# Patient Record
Sex: Male | Born: 1945
Health system: Southern US, Community
[De-identification: ages and names within clinical notes are randomized; demographics above are authoritative.]

## PROBLEM LIST (undated history)

## (undated) DIAGNOSIS — Z9861 Coronary angioplasty status: Principal | ICD-10-CM

## (undated) DIAGNOSIS — D689 Coagulation defect, unspecified: Secondary | ICD-10-CM

## (undated) DIAGNOSIS — I82409 Acute embolism and thrombosis of unspecified deep veins of unspecified lower extremity: Principal | ICD-10-CM

## (undated) DIAGNOSIS — E785 Hyperlipidemia, unspecified: Secondary | ICD-10-CM

## (undated) DIAGNOSIS — E230 Hypopituitarism: Secondary | ICD-10-CM

## (undated) DIAGNOSIS — I2510 Atherosclerotic heart disease of native coronary artery without angina pectoris: Secondary | ICD-10-CM

## (undated) DIAGNOSIS — Z86718 Personal history of other venous thrombosis and embolism: Secondary | ICD-10-CM

## (undated) DIAGNOSIS — E039 Hypothyroidism, unspecified: Secondary | ICD-10-CM

## (undated) DIAGNOSIS — M47817 Spondylosis without myelopathy or radiculopathy, lumbosacral region: Secondary | ICD-10-CM

## (undated) DIAGNOSIS — I10 Essential (primary) hypertension: Secondary | ICD-10-CM

## (undated) HISTORY — DX: Hyperlipidemia, unspecified: E78.5

## (undated) HISTORY — DX: Personal history of other venous thrombosis and embolism: Z86.718

## (undated) HISTORY — DX: Atherosclerotic heart disease of native coronary artery without angina pectoris: I25.10

## (undated) HISTORY — DX: Coronary angioplasty status: Z98.61

## (undated) HISTORY — PX: SPINE SURGERY: SHX786

## (undated) HISTORY — DX: Essential (primary) hypertension: I10

## (undated) HISTORY — PX: ARTHROSCOPY KNEE W/ DRILLING: SUR92

## (undated) HISTORY — DX: Hypothyroidism, unspecified: E03.9

## (undated) HISTORY — DX: Spondylosis without myelopathy or radiculopathy, lumbosacral region: M47.817

## (undated) HISTORY — DX: Acute embolism and thrombosis of unspecified deep veins of unspecified lower extremity: I82.409

## (undated) HISTORY — DX: Coagulation defect, unspecified: D68.9

## (undated) HISTORY — DX: Hypopituitarism: E23.0

---

## 1997-10-24 ENCOUNTER — Observation Stay (HOSPITAL_COMMUNITY): Admission: EM | Admit: 1997-10-24 | Discharge: 1997-10-24 | Payer: Self-pay | Admitting: *Deleted

## 1997-11-03 ENCOUNTER — Ambulatory Visit (HOSPITAL_COMMUNITY): Admission: RE | Admit: 1997-11-03 | Discharge: 1997-11-03 | Payer: Self-pay | Admitting: Cardiology

## 1997-12-27 ENCOUNTER — Ambulatory Visit (HOSPITAL_COMMUNITY): Admission: RE | Admit: 1997-12-27 | Discharge: 1997-12-27 | Payer: Self-pay | Admitting: Cardiology

## 2002-07-17 HISTORY — PX: CORONARY ANGIOPLASTY WITH STENT PLACEMENT: SHX49

## 2002-07-19 DIAGNOSIS — I2510 Atherosclerotic heart disease of native coronary artery without angina pectoris: Secondary | ICD-10-CM | POA: Insufficient documentation

## 2002-07-29 ENCOUNTER — Encounter: Payer: Self-pay | Admitting: Cardiology

## 2002-07-30 ENCOUNTER — Inpatient Hospital Stay (HOSPITAL_COMMUNITY): Admission: AD | Admit: 2002-07-30 | Discharge: 2002-07-31 | Payer: Self-pay | Admitting: Cardiology

## 2003-05-24 ENCOUNTER — Emergency Department (HOSPITAL_COMMUNITY): Admission: EM | Admit: 2003-05-24 | Discharge: 2003-05-24 | Payer: Self-pay | Admitting: Emergency Medicine

## 2003-05-25 ENCOUNTER — Ambulatory Visit (HOSPITAL_COMMUNITY): Admission: RE | Admit: 2003-05-25 | Discharge: 2003-05-25 | Payer: Self-pay | Admitting: Gastroenterology

## 2004-12-12 ENCOUNTER — Emergency Department (HOSPITAL_COMMUNITY): Admission: EM | Admit: 2004-12-12 | Discharge: 2004-12-13 | Payer: Self-pay | Admitting: Emergency Medicine

## 2007-04-17 HISTORY — PX: CARDIAC CATHETERIZATION: SHX172

## 2007-05-05 ENCOUNTER — Ambulatory Visit (HOSPITAL_COMMUNITY): Admission: RE | Admit: 2007-05-05 | Discharge: 2007-05-05 | Payer: Self-pay | Admitting: Cardiology

## 2009-10-08 ENCOUNTER — Encounter: Admission: RE | Admit: 2009-10-08 | Discharge: 2009-10-08 | Payer: Self-pay | Admitting: Emergency Medicine

## 2009-12-13 ENCOUNTER — Inpatient Hospital Stay (HOSPITAL_COMMUNITY): Admission: RE | Admit: 2009-12-13 | Discharge: 2009-12-21 | Payer: Self-pay | Admitting: Neurosurgery

## 2009-12-15 ENCOUNTER — Ambulatory Visit: Payer: Self-pay | Admitting: Vascular Surgery

## 2009-12-15 ENCOUNTER — Encounter (INDEPENDENT_AMBULATORY_CARE_PROVIDER_SITE_OTHER): Payer: Self-pay | Admitting: Internal Medicine

## 2010-06-16 HISTORY — PX: BRAIN SURGERY: SHX531

## 2010-06-16 HISTORY — PX: NM MYOVIEW LTD: HXRAD82

## 2010-07-25 ENCOUNTER — Encounter (HOSPITAL_COMMUNITY)
Admission: RE | Admit: 2010-07-25 | Discharge: 2010-07-25 | Disposition: A | Discharge status: Home / Self Care | Payer: Medicare Other | Source: Ambulatory Visit | Attending: Neurosurgery | Admitting: Neurosurgery

## 2010-07-25 DIAGNOSIS — Z01812 Encounter for preprocedural laboratory examination: Secondary | ICD-10-CM | POA: Insufficient documentation

## 2010-07-25 DIAGNOSIS — Z01818 Encounter for other preprocedural examination: Secondary | ICD-10-CM | POA: Insufficient documentation

## 2010-07-25 LAB — CBC
HCT: 44 % (ref 39.0–52.0)
Hemoglobin: 15.1 g/dL (ref 13.0–17.0)
MCH: 30.2 pg (ref 26.0–34.0)
MCHC: 34.3 g/dL (ref 30.0–36.0)
MCV: 88 fL (ref 78.0–100.0)
Platelets: 267 10*3/uL (ref 150–400)
RBC: 5 MIL/uL (ref 4.22–5.81)
RDW: 13.9 % (ref 11.5–15.5)
WBC: 7.2 10*3/uL (ref 4.0–10.5)

## 2010-07-25 LAB — BASIC METABOLIC PANEL
BUN: 20 mg/dL (ref 6–23)
CO2: 27 mEq/L (ref 19–32)
Calcium: 9.6 mg/dL (ref 8.4–10.5)
Chloride: 106 mEq/L (ref 96–112)
Creatinine, Ser: 1.3 mg/dL (ref 0.4–1.5)
GFR calc Af Amer: >60 mL/min (ref >60)
GFR calc non Af Amer: 55 mL/min — ABNORMAL LOW (ref >60)
Glucose, Bld: 86 mg/dL (ref 70–99)
Potassium: 4.6 mEq/L (ref 3.5–5.1)
Sodium: 138 mEq/L (ref 135–145)

## 2010-07-25 LAB — SURGICAL PCR SCREEN
MRSA, PCR: NEGATIVE
Staphylococcus aureus: NEGATIVE

## 2010-07-29 ENCOUNTER — Inpatient Hospital Stay (HOSPITAL_COMMUNITY)
Admission: RE | Admit: 2010-07-29 | Discharge: 2010-08-01 | DRG: 615 | Disposition: A | Discharge status: Home / Self Care | Payer: Medicare Other | Source: Ambulatory Visit | Attending: Neurosurgery | Admitting: Neurosurgery

## 2010-07-29 ENCOUNTER — Other Ambulatory Visit: Payer: Self-pay | Admitting: Neurosurgery

## 2010-07-29 DIAGNOSIS — D353 Benign neoplasm of craniopharyngeal duct: Principal | ICD-10-CM | POA: Diagnosis present

## 2010-07-29 DIAGNOSIS — Z87891 Personal history of nicotine dependence: Secondary | ICD-10-CM

## 2010-07-29 DIAGNOSIS — I10 Essential (primary) hypertension: Secondary | ICD-10-CM | POA: Diagnosis present

## 2010-07-29 DIAGNOSIS — E119 Type 2 diabetes mellitus without complications: Secondary | ICD-10-CM | POA: Diagnosis present

## 2010-07-29 DIAGNOSIS — I252 Old myocardial infarction: Secondary | ICD-10-CM

## 2010-07-29 DIAGNOSIS — D352 Benign neoplasm of pituitary gland: Principal | ICD-10-CM | POA: Diagnosis present

## 2010-07-29 DIAGNOSIS — Z01812 Encounter for preprocedural laboratory examination: Secondary | ICD-10-CM

## 2010-07-29 DIAGNOSIS — Z9861 Coronary angioplasty status: Secondary | ICD-10-CM

## 2010-07-29 DIAGNOSIS — E039 Hypothyroidism, unspecified: Secondary | ICD-10-CM | POA: Diagnosis present

## 2010-07-29 DIAGNOSIS — Z01818 Encounter for other preprocedural examination: Secondary | ICD-10-CM

## 2010-07-29 DIAGNOSIS — I2510 Atherosclerotic heart disease of native coronary artery without angina pectoris: Secondary | ICD-10-CM | POA: Diagnosis present

## 2010-07-29 LAB — BASIC METABOLIC PANEL
BUN: 14 mg/dL (ref 6–23)
CO2: 24 mEq/L (ref 19–32)
Calcium: 8.4 mg/dL (ref 8.4–10.5)
Chloride: 107 mEq/L (ref 96–112)
Creatinine, Ser: 1.17 mg/dL (ref 0.4–1.5)
GFR calc Af Amer: >60 mL/min (ref >60)
GFR calc non Af Amer: >60 mL/min (ref >60)
Glucose, Bld: 122 mg/dL — ABNORMAL HIGH (ref 70–99)
Potassium: 4.1 mEq/L (ref 3.5–5.1)
Sodium: 139 mEq/L (ref 135–145)

## 2010-07-29 LAB — GLUCOSE, CAPILLARY
Glucose-Capillary: 120 mg/dL — ABNORMAL HIGH (ref 70–99)
Glucose-Capillary: 104 mg/dL — ABNORMAL HIGH (ref 70–99)
Glucose-Capillary: 102 mg/dL — ABNORMAL HIGH (ref 70–99)
Glucose-Capillary: 96 mg/dL (ref 70–99)

## 2010-07-30 LAB — BASIC METABOLIC PANEL
BUN: 14 mg/dL (ref 6–23)
CO2: 25 mEq/L (ref 19–32)
Calcium: 8.5 mg/dL (ref 8.4–10.5)
Chloride: 102 mEq/L (ref 96–112)
Creatinine, Ser: 1.24 mg/dL (ref 0.4–1.5)
GFR calc Af Amer: >60 mL/min (ref >60)
GFR calc non Af Amer: 58 mL/min — ABNORMAL LOW (ref >60)
Glucose, Bld: 136 mg/dL — ABNORMAL HIGH (ref 70–99)
Potassium: 4 mEq/L (ref 3.5–5.1)
Sodium: 136 mEq/L (ref 135–145)

## 2010-07-30 LAB — GLUCOSE, CAPILLARY
Glucose-Capillary: 130 mg/dL — ABNORMAL HIGH (ref 70–99)
Glucose-Capillary: 145 mg/dL — ABNORMAL HIGH (ref 70–99)
Glucose-Capillary: 161 mg/dL — ABNORMAL HIGH (ref 70–99)
Glucose-Capillary: 166 mg/dL — ABNORMAL HIGH (ref 70–99)

## 2010-07-30 LAB — CBC
HCT: 38.2 % — ABNORMAL LOW (ref 39.0–52.0)
Hemoglobin: 12.6 g/dL — ABNORMAL LOW (ref 13.0–17.0)
MCH: 29.6 pg (ref 26.0–34.0)
MCHC: 33 g/dL (ref 30.0–36.0)
MCV: 89.9 fL (ref 78.0–100.0)
Platelets: 243 10*3/uL (ref 150–400)
RBC: 4.25 MIL/uL (ref 4.22–5.81)
RDW: 14.1 % (ref 11.5–15.5)
WBC: 12.2 10*3/uL — ABNORMAL HIGH (ref 4.0–10.5)

## 2010-07-31 LAB — BASIC METABOLIC PANEL
BUN: 14 mg/dL (ref 6–23)
CO2: 27 mEq/L (ref 19–32)
Calcium: 8.6 mg/dL (ref 8.4–10.5)
Chloride: 105 mEq/L (ref 96–112)
Creatinine, Ser: 1.13 mg/dL (ref 0.4–1.5)
GFR calc Af Amer: >60 mL/min (ref >60)
GFR calc non Af Amer: >60 mL/min (ref >60)
Glucose, Bld: 111 mg/dL — ABNORMAL HIGH (ref 70–99)
Potassium: 4 mEq/L (ref 3.5–5.1)
Sodium: 139 mEq/L (ref 135–145)

## 2010-07-31 LAB — GLUCOSE, CAPILLARY
Glucose-Capillary: 82 mg/dL (ref 70–99)
Glucose-Capillary: 98 mg/dL (ref 70–99)
Glucose-Capillary: 113 mg/dL — ABNORMAL HIGH (ref 70–99)
Glucose-Capillary: 124 mg/dL — ABNORMAL HIGH (ref 70–99)
Glucose-Capillary: 95 mg/dL (ref 70–99)

## 2010-08-01 LAB — BASIC METABOLIC PANEL
BUN: 19 mg/dL (ref 6–23)
CO2: 28 mEq/L (ref 19–32)
Calcium: 8.3 mg/dL — ABNORMAL LOW (ref 8.4–10.5)
Chloride: 105 mEq/L (ref 96–112)
Creatinine, Ser: 1.2 mg/dL (ref 0.4–1.5)
GFR calc Af Amer: >60 mL/min (ref >60)
GFR calc non Af Amer: >60 mL/min (ref >60)
Glucose, Bld: 89 mg/dL (ref 70–99)
Potassium: 3.8 mEq/L (ref 3.5–5.1)
Sodium: 139 mEq/L (ref 135–145)

## 2010-08-01 LAB — GLUCOSE, CAPILLARY
Glucose-Capillary: 80 mg/dL (ref 70–99)
Glucose-Capillary: 95 mg/dL (ref 70–99)

## 2010-08-08 NOTE — Discharge Summary (Signed)
  NAMEKISHAWN, PICKAR                ACCOUNT NO.:  [id number]  MEDICAL RECORD NO.:  [id number]           PATIENT TYPE:  I  LOCATION:  3034                         FACILITY:  [facility]  PHYSICIAN:  Cristi Loron, M.D.DATE OF BIRTH:  [birth year]  DATE OF ADMISSION:  07/29/2010 DATE OF DISCHARGE:  08/01/2010                              DISCHARGE SUMMARY   BRIEF HISTORY:  The patient is a 65 year old black male who has been found to have a pituitary tumor.  He elected to undergo a transsphenoidal resection of the tumor.  For full details of this admission, please refer to typed history and physical.  HOSPITAL COURSE:  I admitted the patient is to [facility] on July 29, 2010, with a diagnosis of pituitary adenoma.  On the day of admission, Dr. Dillard Cannon and I performed a transsphenoidal resection of this tumor.  The surgery went well (for full details of this operation, please refer typed operative notes). POSTOPERATIVE COURSE:  The patient's postop course was unremarkable.  He showed no signs of diabetes insipidus and his electrolytes were normal and he is requesting discharge home on postop #3, i.e., August 01, 2010.  He was discharged home on August 01, 2010.  DISCHARGE INSTRUCTIONS:  The patient was given written discharge instructions and told to follow up me, Dr. Newell Coral, and Dr. Lucianne Muss.  DISCHARGE PRESCRIPTIONS:  Norco 10/325, #50, one-half to one p.o. q.4 h. p.r.n. for pain with one refill.  FINAL DIAGNOSES:  Pituitary adenoma.  PROCEDURE PERFORMED:  Transsphenoidal resection of pituitary adenoma.     Cristi Loron, M.D.     JDJ/MEDQ  D:  08/01/2010  T:  08/02/2010  Job:  [id number]  cc:   Kristine Garbe. Ezzard Standing, M.D. Reather Littler, M.D.  Electronically Signed by Tressie Stalker M.D. on 08/08/2010 12:14:04 PM

## 2010-08-08 NOTE — Op Note (Signed)
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  He was worked up with a head CT and a brain MRI which demonstrated he had a sellar lesion.  I discussed the various treatments with the patient including surgery.  The patient has weighed the risks, benefits, alternatives of surgery, and decided to proceed with a transmural resection of this tumor.  PREOPERATIVE DIAGNOSIS:  Pituitary adenoma.  POSTOPERATIVE DIAGNOSIS:  Pituitary adenoma.  PROCEDURE:  Transmural resection of pituitary adenoma using microdissection.  SURGEON:  Cristi Loron, M.D./Christopher E. Ezzard Standing, M.D.  ANESTHESIA:  General endotracheal.  ASSISTANT:  None.  ESTIMATED BLOOD LOSS:  100 mL.  SPECIMENS:  Tumor.  DRAINS:  None.  COMPLICATIONS:  None.  DESCRIPTION OF PROCEDURE:  The patient was brought to the operating room by the anesthesia team.  General endotracheal anesthesia was induced.  I applied the [place] three-point headrest to the patient's calvarium. Dr. Ezzard Standing and I positioned the patient.  Dr. Ezzard Standing performed the exposure surgery, so for further details of this part of the operation, please refer to his typed operative note.  Once Dr. Ezzard Standing had exposed the posterior wall of the sphenoid sinus, I took over the operation.  I used Kerrison punches to remove the floor of the sella.  It was quite thin, exposing the dura.  I incised the dura in the midline in a cruciate fashion, and then using pituitary forceps and then various curettes, removed the tumor from within the sella.  It seemed  consistent with a typical pituitary adenoma.  I used curettes to perform a gross total resection of the tumor.  Once I was satisfied with the removal of the tumor, I obtained hemostasis by using thrombin-soaked Gelfoam.  I let the Gelfoam soak in the sella while I obtained the abdominal fat graft.  The patient's abdominal region was prepared with Betadine scrub and Betadine solution.  I then incised by incision patient's right abdomen. I used Metzenbaum scissors to obtain a back graft and then obtained hemostasis with bipolar electrocautery.  I then reapproximated the patient's subcutaneous tissue with interrupted 2-0 Vicryl suture and the skin with Steri-Strips and Benzoin.  I then removed the Gelfoam from the sella.  We could now see the diaphragm sella arachnoid sagging down from the intracranial portion. I  placed another small piece of Gelfoam in the sella and then placed a fat graft in the sphenoid sinus and kept it in place with a piece of cartilage.  At this point, Dr. Ezzard Standing took over the operation and completed closure.  The patient was subsequently extubated by the anesthesia team and transported to the post-anesthesia care unit in stable condition.  All sponge, needle, and instrument counts were correct  at the end of this case.     Cristi Loron, M.D.     JDJ/MEDQ  D:  07/29/2010  T:  07/30/2010  Job:  [id number]  Electronically Signed by Tressie Stalker M.D. on 08/08/2010 12:15:13 PM

## 2010-08-14 NOTE — Op Note (Signed)
NAMECLARE, CASTO                ACCOUNT NO.:  [id number]  MEDICAL RECORD NO.:  [id number]           PATIENT TYPE:  I  LOCATION:  3101                         FACILITY:  [facility]  PHYSICIAN:  Kristine Garbe. Ezzard Standing, M.D.DATE OF BIRTH:  Apr 19, 1946  DATE OF PROCEDURE:  07/29/2010 DATE OF DISCHARGE:                              OPERATIVE REPORT   PREOPERATIVE DIAGNOSIS:  Pituitary tumor.  POSTOPERATIVE DIAGNOSIS:  Pituitary tumor.  OPERATION PERFORMED:  Transseptal transsphenoidal resection of pituitary tumor.  SURGEON:  Kristine Garbe. Ezzard Standing, MD  CO-SURGEON:  Cristi Loron, MD  ANESTHESIA:  General endotracheal.  COMPLICATIONS:  None.  BRIEF CLINICAL NOTE:  Etheridge Geil is a 65 year old gentleman who was found to have a pituitary tumor following lumbar laminectomy with Dr. Lovell Sheehan.  He was taken to the operating room at this time for transseptal transsphenoidal resection of pituitary tumor.  Of note, he has had no visual changes and has seen Dr. Lucianne Muss for endocrine evaluation preoperatively.  DESCRIPTION OF PROCEDURE:  The patient was brought to the operating room in the neuro OR, placed in the supine position and positioned by Dr. Lovell Sheehan and myself.  His nose and abdomen were prepped with Betadine solution and draped out with sterile towels.  The nose was then further prepped with cotton pledgets soaked in Afrin for decongestant nose and 10-12 mL of Xylocaine with 100,000 epinephrine was injected into the septum and internal nostrils for hemostasis.  First, I performed the approach to the sphenoid sinus making extended hemitransfixion incision along the caudle edge    of the septum on the right side.  This extended down to along the floor of the nose.  Mucoperichondrial and periosteal flaps were elevated off of the right side of the septum and floor of the nose. Next, the mucoperiosteum was elevated up to the floor of the nose on the left side.  The cartilage was  transected vertically approximately 2-3 cm posteriorly just anterior to the cartilaginous bony junction.  The Mucoperichondrium was left attached on the left side and the anterior cartilage was displaced off the maxillary crest into the left airway. Posterior to the vertical incision, Mucoperichondrium and mucoperiosteal flaps were elevated on either side of the septum bone and cartilage back to its base of the sphenoid sinus.  The [facility] retractor was position. Some of the cartilage and bone was removed for a later closure and using the small 4-mm osteotome, sphenoidotomy was performed to the sphenoid sinus.  The sphenoidotomy was enlarged with Kerrison forceps superiorly, inferiorly and laterally to expose the sella.  The Hardy retractor was positioned, microscope was brought into position.  The sphenoid septum was identified and was just left of midline.  The sphenoid sinus was stripped of mucosa.  At this point after exposing the sella, Dr. Lovell Sheehan came in and opened up the sella and removed pituitary tumor.  I was called back in after removal of THE pituitary tumor.  The sella and sphenoid sinus was packed with previously harvested fat by Dr. Lovell Sheehan. There was no indication of CSF leak.  The septum was brought back onto the maxillary crest back  to midline.  The hemitransfixion incision was closed with interrupted 4-0 chromic suture.  The septum was basted with a 3-0 chromic suture.  Doyle splints were secured on either side of the septum with a 4-0 nylon suture and the nasal cavity was packed on both sides with Merocel soaked in bacitracin ointment and hydrated with saline.  Oropharynx was suctioned.  This completed the procedure. Domonik was awakened from anesthesia and transferred to recovery, postop doing well.  DISPOSITION:  Brynn will be admitted to the Neuro ICU.  We will plan on removing his Merocel nasal packs in 3-4 days.           ______________________________ Kristine Garbe Ezzard Standing, M.D.     CEN/MEDQ  D:  07/29/2010  T:  07/30/2010  Job:  [id number]  cc:   Cristi Loron, M.D.  Electronically Signed by Dillard Cannon M.D. on 08/14/2010 01:21:42 PM

## 2010-09-01 LAB — PROCALCITONIN: Procalcitonin: 2 ng/mL

## 2010-09-01 LAB — COMPREHENSIVE METABOLIC PANEL
ALT: 213 U/L — ABNORMAL HIGH (ref 0–53)
ALT: 22 U/L (ref 0–53)
AST: 162 U/L — ABNORMAL HIGH (ref 0–37)
AST: 50 U/L — ABNORMAL HIGH (ref 0–37)
Albumin: 2.5 g/dL — ABNORMAL LOW (ref 3.5–5.2)
Albumin: 3.5 g/dL (ref 3.5–5.2)
Alkaline Phosphatase: 51 U/L (ref 39–117)
Alkaline Phosphatase: 58 U/L (ref 39–117)
BUN: 10 mg/dL (ref 6–23)
BUN: 18 mg/dL (ref 6–23)
CO2: 25 mEq/L (ref 19–32)
CO2: 25 mEq/L (ref 19–32)
Calcium: 7.7 mg/dL — ABNORMAL LOW (ref 8.4–10.5)
Calcium: 9.4 mg/dL (ref 8.4–10.5)
Chloride: 111 mEq/L (ref 96–112)
Chloride: 97 mEq/L (ref 96–112)
Creatinine, Ser: 1.01 mg/dL (ref 0.4–1.5)
Creatinine, Ser: 1.66 mg/dL — ABNORMAL HIGH (ref 0.4–1.5)
GFR calc Af Amer: 50 mL/min — ABNORMAL LOW (ref >60)
GFR calc Af Amer: >60 mL/min (ref >60)
GFR calc non Af Amer: 42 mL/min — ABNORMAL LOW (ref >60)
GFR calc non Af Amer: >60 mL/min (ref >60)
Glucose, Bld: 103 mg/dL — ABNORMAL HIGH (ref 70–99)
Glucose, Bld: 130 mg/dL — ABNORMAL HIGH (ref 70–99)
Potassium: 3.6 mEq/L (ref 3.5–5.1)
Potassium: 4.1 mEq/L (ref 3.5–5.1)
Sodium: 133 mEq/L — ABNORMAL LOW (ref 135–145)
Sodium: 143 mEq/L (ref 135–145)
Total Bilirubin: 0.2 mg/dL — ABNORMAL LOW (ref 0.3–1.2)
Total Bilirubin: 0.5 mg/dL (ref 0.3–1.2)
Total Protein: 5.4 g/dL — ABNORMAL LOW (ref 6.0–8.3)
Total Protein: 7.2 g/dL (ref 6.0–8.3)

## 2010-09-01 LAB — CBC
HCT: 27.7 % — ABNORMAL LOW (ref 39.0–52.0)
HCT: 28.1 % — ABNORMAL LOW (ref 39.0–52.0)
HCT: 32.3 % — ABNORMAL LOW (ref 39.0–52.0)
HCT: 35.5 % — ABNORMAL LOW (ref 39.0–52.0)
HCT: 40.5 % (ref 39.0–52.0)
HCT: 37.8 % — ABNORMAL LOW (ref 39.0–52.0)
Hemoglobin: 10.9 g/dL — ABNORMAL LOW (ref 13.0–17.0)
Hemoglobin: 12.3 g/dL — ABNORMAL LOW (ref 13.0–17.0)
Hemoglobin: 14.1 g/dL (ref 13.0–17.0)
Hemoglobin: 9.4 g/dL — ABNORMAL LOW (ref 13.0–17.0)
Hemoglobin: 9.5 g/dL — ABNORMAL LOW (ref 13.0–17.0)
Hemoglobin: 13.1 g/dL (ref 13.0–17.0)
MCH: 31.7 pg (ref 26.0–34.0)
MCH: 31.8 pg (ref 26.0–34.0)
MCH: 32 pg (ref 26.0–34.0)
MCH: 32.1 pg (ref 26.0–34.0)
MCH: 32.2 pg (ref 26.0–34.0)
MCH: 32.3 pg (ref 26.0–34.0)
MCHC: 33.9 g/dL (ref 30.0–36.0)
MCHC: 33.9 g/dL (ref 30.0–36.0)
MCHC: 34 g/dL (ref 30.0–36.0)
MCHC: 34.5 g/dL (ref 30.0–36.0)
MCHC: 34.7 g/dL (ref 30.0–36.0)
MCHC: 34.7 g/dL (ref 30.0–36.0)
MCV: 92.6 fL (ref 78.0–100.0)
MCV: 92.9 fL (ref 78.0–100.0)
MCV: 93.5 fL (ref 78.0–100.0)
MCV: 93.7 fL (ref 78.0–100.0)
MCV: 94.3 fL (ref 78.0–100.0)
MCV: 93.3 fL (ref 78.0–100.0)
Platelets: 255 10*3/uL (ref 150–400)
Platelets: 302 10*3/uL (ref 150–400)
Platelets: 312 10*3/uL (ref 150–400)
Platelets: 316 10*3/uL (ref 150–400)
Platelets: 361 10*3/uL (ref 150–400)
Platelets: 252 10*3/uL (ref 150–400)
RBC: 2.95 MIL/uL — ABNORMAL LOW (ref 4.22–5.81)
RBC: 3.01 MIL/uL — ABNORMAL LOW (ref 4.22–5.81)
RBC: 3.42 MIL/uL — ABNORMAL LOW (ref 4.22–5.81)
RBC: 3.82 MIL/uL — ABNORMAL LOW (ref 4.22–5.81)
RBC: 4.37 MIL/uL (ref 4.22–5.81)
RBC: 4.05 MIL/uL — ABNORMAL LOW (ref 4.22–5.81)
RDW: 12.8 % (ref 11.5–15.5)
RDW: 13 % (ref 11.5–15.5)
RDW: 13.6 % (ref 11.5–15.5)
RDW: 13.7 % (ref 11.5–15.5)
RDW: 13.8 % (ref 11.5–15.5)
RDW: 13 % (ref 11.5–15.5)
WBC: 11.6 10*3/uL — ABNORMAL HIGH (ref 4.0–10.5)
WBC: 12.2 10*3/uL — ABNORMAL HIGH (ref 4.0–10.5)
WBC: 12.2 10*3/uL — ABNORMAL HIGH (ref 4.0–10.5)
WBC: 12.9 10*3/uL — ABNORMAL HIGH (ref 4.0–10.5)
WBC: 25.7 10*3/uL — ABNORMAL HIGH (ref 4.0–10.5)
WBC: 6.2 10*3/uL (ref 4.0–10.5)

## 2010-09-01 LAB — BASIC METABOLIC PANEL
BUN: 14 mg/dL (ref 6–23)
BUN: 18 mg/dL (ref 6–23)
BUN: 11 mg/dL (ref 6–23)
CO2: 24 mEq/L (ref 19–32)
CO2: 28 mEq/L (ref 19–32)
CO2: 27 mEq/L (ref 19–32)
Calcium: 8 mg/dL — ABNORMAL LOW (ref 8.4–10.5)
Calcium: 8.8 mg/dL (ref 8.4–10.5)
Calcium: 9.4 mg/dL (ref 8.4–10.5)
Chloride: 102 mEq/L (ref 96–112)
Chloride: 105 mEq/L (ref 96–112)
Chloride: 102 mEq/L (ref 96–112)
Creatinine, Ser: 1.26 mg/dL (ref 0.4–1.5)
Creatinine, Ser: 1.48 mg/dL (ref 0.4–1.5)
Creatinine, Ser: 1.14 mg/dL (ref 0.4–1.5)
GFR calc Af Amer: 58 mL/min — ABNORMAL LOW (ref >60)
GFR calc Af Amer: >60 mL/min (ref >60)
GFR calc Af Amer: >60 mL/min (ref >60)
GFR calc non Af Amer: 48 mL/min — ABNORMAL LOW (ref >60)
GFR calc non Af Amer: 57 mL/min — ABNORMAL LOW (ref >60)
GFR calc non Af Amer: >60 mL/min (ref >60)
Glucose, Bld: 179 mg/dL — ABNORMAL HIGH (ref 70–99)
Glucose, Bld: 198 mg/dL — ABNORMAL HIGH (ref 70–99)
Glucose, Bld: 104 mg/dL — ABNORMAL HIGH (ref 70–99)
Potassium: 4.2 mEq/L (ref 3.5–5.1)
Potassium: 4.8 mEq/L (ref 3.5–5.1)
Potassium: 4.4 mEq/L (ref 3.5–5.1)
Sodium: 134 mEq/L — ABNORMAL LOW (ref 135–145)
Sodium: 134 mEq/L — ABNORMAL LOW (ref 135–145)
Sodium: 136 mEq/L (ref 135–145)

## 2010-09-01 LAB — GROWTH HORMONE: Growth Hormone: <0.05 ng/mL (ref 0.01–1.00)

## 2010-09-01 LAB — TESTOSTERONE, % FREE: Testosterone-% Free: 2.4 % — ABNORMAL HIGH (ref 1.6–2.9)

## 2010-09-01 LAB — TSH: TSH: 4.26 u[IU]/mL (ref 0.350–4.500)

## 2010-09-01 LAB — BLOOD GAS, ARTERIAL
Acid-Base Excess: 1.2 mmol/L (ref 0.0–2.0)
Bicarbonate: 24.1 mEq/L — ABNORMAL HIGH (ref 20.0–24.0)
Drawn by: 33241
FIO2: 0.21 %
O2 Saturation: 93 %
Patient temperature: 98.6
TCO2: 25.1 mmol/L (ref 0–100)
pCO2 arterial: 30.6 mmHg — ABNORMAL LOW (ref 35.0–45.0)
pH, Arterial: 7.507 — ABNORMAL HIGH (ref 7.350–7.450)
pO2, Arterial: 57.7 mmHg — ABNORMAL LOW (ref 80.0–100.0)

## 2010-09-01 LAB — URINALYSIS, ROUTINE W REFLEX MICROSCOPIC
Bilirubin Urine: NEGATIVE
Glucose, UA: NEGATIVE mg/dL
Hgb urine dipstick: NEGATIVE
Ketones, ur: NEGATIVE mg/dL
Nitrite: NEGATIVE
Protein, ur: NEGATIVE mg/dL
Specific Gravity, Urine: 1.014 (ref 1.005–1.030)
Urobilinogen, UA: 0.2 mg/dL (ref 0.0–1.0)
pH: 5 (ref 5.0–8.0)

## 2010-09-01 LAB — CORTISOL: Cortisol, Plasma: 74.4 ug/dL

## 2010-09-01 LAB — GLUCOSE, CAPILLARY
Glucose-Capillary: 100 mg/dL — ABNORMAL HIGH (ref 70–99)
Glucose-Capillary: 104 mg/dL — ABNORMAL HIGH (ref 70–99)
Glucose-Capillary: 105 mg/dL — ABNORMAL HIGH (ref 70–99)
Glucose-Capillary: 107 mg/dL — ABNORMAL HIGH (ref 70–99)
Glucose-Capillary: 114 mg/dL — ABNORMAL HIGH (ref 70–99)
Glucose-Capillary: 115 mg/dL — ABNORMAL HIGH (ref 70–99)
Glucose-Capillary: 125 mg/dL — ABNORMAL HIGH (ref 70–99)
Glucose-Capillary: 125 mg/dL — ABNORMAL HIGH (ref 70–99)
Glucose-Capillary: 126 mg/dL — ABNORMAL HIGH (ref 70–99)
Glucose-Capillary: 127 mg/dL — ABNORMAL HIGH (ref 70–99)
Glucose-Capillary: 132 mg/dL — ABNORMAL HIGH (ref 70–99)
Glucose-Capillary: 132 mg/dL — ABNORMAL HIGH (ref 70–99)
Glucose-Capillary: 133 mg/dL — ABNORMAL HIGH (ref 70–99)
Glucose-Capillary: 137 mg/dL — ABNORMAL HIGH (ref 70–99)
Glucose-Capillary: 138 mg/dL — ABNORMAL HIGH (ref 70–99)
Glucose-Capillary: 143 mg/dL — ABNORMAL HIGH (ref 70–99)
Glucose-Capillary: 144 mg/dL — ABNORMAL HIGH (ref 70–99)
Glucose-Capillary: 152 mg/dL — ABNORMAL HIGH (ref 70–99)
Glucose-Capillary: 153 mg/dL — ABNORMAL HIGH (ref 70–99)
Glucose-Capillary: 158 mg/dL — ABNORMAL HIGH (ref 70–99)
Glucose-Capillary: 162 mg/dL — ABNORMAL HIGH (ref 70–99)
Glucose-Capillary: 164 mg/dL — ABNORMAL HIGH (ref 70–99)
Glucose-Capillary: 171 mg/dL — ABNORMAL HIGH (ref 70–99)
Glucose-Capillary: 198 mg/dL — ABNORMAL HIGH (ref 70–99)
Glucose-Capillary: 225 mg/dL — ABNORMAL HIGH (ref 70–99)
Glucose-Capillary: 68 mg/dL — ABNORMAL LOW (ref 70–99)
Glucose-Capillary: 77 mg/dL (ref 70–99)
Glucose-Capillary: 77 mg/dL (ref 70–99)
Glucose-Capillary: 89 mg/dL (ref 70–99)
Glucose-Capillary: 94 mg/dL (ref 70–99)
Glucose-Capillary: 112 mg/dL — ABNORMAL HIGH (ref 70–99)
Glucose-Capillary: 83 mg/dL (ref 70–99)
Glucose-Capillary: 94 mg/dL (ref 70–99)
Glucose-Capillary: 97 mg/dL (ref 70–99)

## 2010-09-01 LAB — URINE CULTURE
Colony Count: NO GROWTH
Culture: NO GROWTH

## 2010-09-01 LAB — PROTIME-INR
INR: 1.12 (ref 0.00–1.49)
INR: 1.13 (ref 0.00–1.49)
INR: 1.42 (ref 0.00–1.49)
INR: 1.8 — ABNORMAL HIGH (ref 0.00–1.49)
INR: 2.07 — ABNORMAL HIGH (ref 0.00–1.49)
INR: 2.09 — ABNORMAL HIGH (ref 0.00–1.49)
Prothrombin Time: 14.3 seconds (ref 11.6–15.2)
Prothrombin Time: 14.4 seconds (ref 11.6–15.2)
Prothrombin Time: 17.2 seconds — ABNORMAL HIGH (ref 11.6–15.2)
Prothrombin Time: 20.7 seconds — ABNORMAL HIGH (ref 11.6–15.2)
Prothrombin Time: 23.1 seconds — ABNORMAL HIGH (ref 11.6–15.2)
Prothrombin Time: 23.3 seconds — ABNORMAL HIGH (ref 11.6–15.2)

## 2010-09-01 LAB — CULTURE, BLOOD (ROUTINE X 2)
Culture: NO GROWTH
Culture: NO GROWTH
Culture: NO GROWTH
Culture: NO GROWTH

## 2010-09-01 LAB — MAGNESIUM: Magnesium: 1.8 mg/dL (ref 1.5–2.5)

## 2010-09-01 LAB — DIFFERENTIAL
Basophils Absolute: 0.1 10*3/uL (ref 0.0–0.1)
Basophils Relative: 1 % (ref 0–1)
Eosinophils Absolute: 0.1 10*3/uL (ref 0.0–0.7)
Eosinophils Relative: 1 % (ref 0–5)
Lymphocytes Relative: 16 % (ref 12–46)
Lymphs Abs: 2 10*3/uL (ref 0.7–4.0)
Monocytes Absolute: 1.3 10*3/uL — ABNORMAL HIGH (ref 0.1–1.0)
Monocytes Relative: 11 % (ref 3–12)
Neutro Abs: 8.7 10*3/uL — ABNORMAL HIGH (ref 1.7–7.7)
Neutrophils Relative %: 72 % (ref 43–77)

## 2010-09-01 LAB — SURGICAL PCR SCREEN
MRSA, PCR: NEGATIVE
Staphylococcus aureus: NEGATIVE

## 2010-09-01 LAB — CHLORIDE, URINE, RANDOM: Chloride Urine: 36 mEq/L

## 2010-09-01 LAB — LACTIC ACID, PLASMA: Lactic Acid, Venous: 1.3 mmol/L (ref 0.5–2.2)

## 2010-09-01 LAB — MRSA PCR SCREENING: MRSA by PCR: NEGATIVE

## 2010-09-01 LAB — TESTOSTERONE, FREE: Testosterone, Free: 4.4 pg/mL — ABNORMAL LOW (ref 47.0–244.0)

## 2010-09-01 LAB — SEX HORMONE BINDING GLOBULIN: Sex Hormone Binding: 19 nmol/L (ref 13–71)

## 2010-09-01 LAB — FOLLICLE STIMULATING HORMONE: FSH: 10.7 m[IU]/mL (ref 1.4–18.1)

## 2010-09-01 LAB — SODIUM, URINE, RANDOM: Sodium, Ur: <10 mEq/L

## 2010-09-01 LAB — PROLACTIN: Prolactin: 21.6 ng/mL — ABNORMAL HIGH (ref 2.1–17.1)

## 2010-09-01 LAB — LUTEINIZING HORMONE: LH: 1 m[IU]/mL — ABNORMAL LOW (ref 1.5–9.3)

## 2010-09-01 LAB — T4, FREE: Free T4: 0.52 ng/dL — ABNORMAL LOW (ref 0.80–1.80)

## 2010-09-01 LAB — TESTOSTERONE: Testosterone: 18.63 ng/dL — ABNORMAL LOW (ref 350–890)

## 2010-10-29 NOTE — Cardiovascular Report (Signed)
Carl Gutierrez, TAMARGO                ACCOUNT NO.:  [id number]   MEDICAL RECORD NO.:  [id number]          PATIENT TYPE:  OIB   LOCATION:  2856                         FACILITY:  [facility]   PHYSICIAN:  Madaline Savage, M.D.DATE OF BIRTH:  Jan 15, 1946   DATE OF PROCEDURE:  05/05/2007  DATE OF DISCHARGE:                            CARDIAC CATHETERIZATION   This is a very pleasant 65 year old married African American gentleman  with known coronary disease who has had two coronary artery stents in  the past.  He has had a stent placed to his distal LAD and one to an  obtuse marginal branch #2 that is proximally placed.  He had a recent  outpatient cardiac stress test performed that showed evidence of  ischemia that is apparently an old area of ischemia in the apical  lateral wall.  The ejection fraction was said to be 64%.  Today the  patient presented to [facility] for a cardiac cath  with an eye toward a possible intervention should it become necessary.  No complications occurred during the cath and no intervention was  required.   RESULTS:  PRESSURES:  The left ventricular pressure was 130/6, end-  diastolic pressure 12.  Central aortic pressure 130/75, mean of 95, no  aortic valve gradient by pullback technique.   ANGIOGRAPHIC RESULTS:  The patient's left main coronary artery was  normal.  The left main and LAD were widely patent throughout.  There is  an LAD stent that is widely patent.  There is a marked discrepancy  between the diameter of the LAD proximal and the LAD distal to the first  septal perforator branch.  The discrepancy is the vessel looks about 5  mm in diameter more proximally and looks more like a 2 to 2.5 mm vessel  during the latter two thirds of the LAD coursing to the apex.  It is a  smooth vessel without stenoses and the distal stent is patent.   The circumflex coronary artery is a large dominant vessel giving rise to  a huge first obtuse  marginal branch and an ongoing circumflex branch  which contains a second obtuse marginal branch.  The second obtuse  marginal branch is severely diseased and is small in caliber and should  be treated medically.   The ongoing circumflex is normal.   An obtuse marginal branch #2 and contains a proximally placed stent  which is patent and the distal runoff is good.   RCA is small and nondominant.  There are two areas of 75% stenosis,  neither of these is amenable or desirable for angioplasty for stenting  and medical treatment is best recommended.   Left ventricular angiography shows an LVEF of 50-60% with no evidence of  mitral regurgitation.   PLAN:  The patient will be reassured and treated medically.  I suspect  that the abnormal stress test results from obtuse marginal branch #2  which is small, diffusely diseased and not treatable or amenable to  percutaneous stenting.  We will continue him on beta blockade, ACE  inhibitor, aspirin and Plavix.  The patient will be discharged shortly  since he had a successful Perclose femoral arteriotomy.           ______________________________  Madaline Savage, M.D.     WHG/MEDQ  D:  05/05/2007  T:  05/06/2007  Job:  [id number]   cc:   [facility]

## 2010-11-01 NOTE — Discharge Summary (Signed)
NAME:  Carl Gutierrez, Carl Gutierrez                          ACCOUNT NO.:  [id number]   MEDICAL RECORD NO.:  [id number]                   PATIENT TYPE:  INP   LOCATION:  3730                                 FACILITY:  [facility]   PHYSICIAN:  Abelino Derrick, P.A.                DATE OF BIRTH:  [birth year]   DATE OF ADMISSION:  DATE OF DISCHARGE:  07/31/2002                                 DISCHARGE SUMMARY   DISCHARGE DIAGNOSES:  1. Coronary disease, abnormal Cardiolite study prior to admission.  2. Coronary disease with left anterior descending and ramus intermedius     intervention with a Cypher stent placed to the ramus intermedius.  The     patient had residual of 75% right coronary artery and an enzyme elevation     post procedure consistent with a subendocardial myocardial infarction.  3. Hyperlipidemia, statin added this admission.  4. Hypertension, treated.   HOSPITAL COURSE:  The patient is a 65 year old male who had a DOT exam and  subsequent Cardiolite study that was abnormal and positive for ischemia of  the circumflex distribution.  He has a history of hypertension,  hyperlipidemia, and previous myocardial infarction with a PCI in 1991.  The  patient had no history of chest pain.  He was admitted for elective  catheterization which was done 07/29/02 by Dr. Elsie Lincoln.  Catheterization  revealed normal left main, 99% distal LAD, 80% ramus, OM2, 40% distal  circumflex, and 75% small RCA, and EF of 60%.  He underwent two-vessel  intervention with a Cypher stent placed to the ramus, and a Express T stent  to the distal LAD.  The patient tolerated this well.  Post procedure, he had  a CK-MB consistent with SEMI.  CK-'s peaked at 303 with 30 MB's.  He was  kept on Nitro-Dur 24 hours.  He remains asymptomatic.  We feel he can be  discharged 07/31/02.   DISCHARGE MEDICATIONS:  1. Toprol XL 50 mg every day.  2. Aspirin 81 mg every day.  3. Plavix 75 mg every day.  4. Norvasc 5 mg a day.  5.  Nitroglycerin sublingual p.r.n.  6. Lipitor 20 mg a day.   LABORATORY DATA:  CK-MB's and troponins are noted as above.  Lipid profile  revealed a cholesterol of 275 with an HDL of 59, LDL 190, sodium 136,  potassium 4.1, BUN 14, creatinine 1.2.  Hemoglobin 14.5, hematocrit 42.   EKG revealed sinus rhythm, sinus bradycardia without acute changes.   DISPOSITION:  The patient was discharged in stable condition to follow up  with Dr. Madaline Savage.  He may want to consider changing him from  Norvasc to an ACE inhibitor as an outpatient.  Dr. Elsie Lincoln has instructed him  not to go back to work for the next nine days.  Abelino Derrick, P.ALenard Lance  D:  07/31/2002  T:  07/31/2002  Job:  [id number]

## 2010-11-01 NOTE — Op Note (Signed)
NAME:  Carl Gutierrez, Carl Gutierrez                          ACCOUNT NO.:  [id number]   MEDICAL RECORD NO.:  [id number]                   PATIENT TYPE:  AMB   LOCATION:  ENDO                                 FACILITY:  [facility]   PHYSICIAN:  John C. Madilyn Fireman, M.D.                 DATE OF BIRTH:  01-Jan-1946   DATE OF PROCEDURE:  05/25/2003  DATE OF DISCHARGE:                                 OPERATIVE REPORT   PROCEDURE:  Esophagogastroduodenoscopy with biopsy.   INDICATION FOR PROCEDURE:  Coffee-grounds emesis and heme-positive stool.   DESCRIPTION OF PROCEDURE:  The patient was placed in the left lateral  decubitus position and placed on the pulse monitor with continuous low-flow  oxygen delivered by nasal cannula.  He was sedated with 5 mcg IV fentanyl  and 6 mg IV Versed.  The Olympus video endoscope was advanced under direct  vision to the oropharynx and esophagus.  The esophagus was straight and of  normal caliber with the squamocolumnar line at 38 cm.  There was no visible  hiatal hernia, ring, stricture, or other abnormality at the GE junction.  The stomach was entered and a small amount of liquid secretions were  suctioned from the fundus.  Retroflexed view of the cardia was unremarkable.  The fundus, antrum, body, and pylorus all appeared normal.  The duodenum was  somewhat scarred and deformed, and there was a clean-based duodenal lateral  ulcer with a very small visible vessel, which was not bleeding.  There were  also several erosions in the first and second portion of the duodenum.  The  distal second portion appeared normal.  The scope was then drawn back to the  stomach and CLOtest obtained.  The scope was then withdrawn and the patient  returned to the recovery room in stable condition.  He tolerated the  procedure well, and there were no immediate complications.   IMPRESSION:  Duodenal ulcer with duodenitis.   PLAN:  1. Will treat with proton pump inhibitor.  2. Will hold his aspirin  and Plavix for approximately five days and monitor     stools and hemoglobin and await CLOtest.                                               John C. Madilyn Fireman, M.D.    JCH/MEDQ  D:  05/25/2003  T:  05/25/2003  Job:  [id number]

## 2010-11-01 NOTE — Consult Note (Signed)
NAME:  Carl Gutierrez, Carl Gutierrez                          ACCOUNT NO.:  [id number]   MEDICAL RECORD NO.:  [id number]                   PATIENT TYPE:  AMB   LOCATION:  ENDO                                 FACILITY:  [facility]   PHYSICIAN:  John C. Madilyn Fireman, M.D.                 DATE OF BIRTH:  August 25, 1945   DATE OF CONSULTATION:  05/25/2003  DATE OF DISCHARGE:                                   CONSULTATION   HISTORY OF PRESENT ILLNESS:  Patient is a 65 year old black male who  presented after noticing dark to maroon stools yesterday morning on two  occasions and then having vomiting of coffee-ground material x1.  He went to  Urgent Care and had hemoglobin of 14.2 with stable vital signs.  He was  transferred to [facility] Emergency Room and re-evaluated there, and had a  hemoglobin of 13.9 with BUN of 20 and appeared clinically stable with no  orthostasis, and it was felt he was safe for release but I set him up to  meet him at [facility] this morning for an EGD and consultation  and possible admission.  The patient has had no further stools or emesis  since being released from the emergency room last night.   PAST MEDICAL HISTORY:  1. Coronary artery disease status post stent placements earlier this year.  2. Hyperlipidemia.  3. Hypertension.   SURGERIES:  Knee surgery three or four years ago.   SOCIAL HISTORY:  The patient denies alcohol or tobacco abuse.  He is a Ecologist, he is married.   FAMILY HISTORY:  Mother died of some unknown sort of malignancy.  Father's  history is unknown.  No other family history of GI malignancy or ulcers.   MEDICATIONS:  1. Plavix 75 mg a day.  2. Aspirin 80 mg a day.  3. Norvasc 5 mg a day.  4. Lipitor 40 mg a day.  5. Toprol XL unspecified dose.   PHYSICAL EXAMINATION:  GENERAL:  Well-developed, well-nourished black male  in no acute distress.  HEENT:  Unremarkable.  HEART:  Regular rate and rhythm without murmur.  LUNGS:  Clear.  ABDOMEN:   Soft and nondistended with normal active bowel sounds.  No  hepatosplenomegaly, mass or guarding.  RECTAL:  Not repeated.  He had dark heme-positive stool when he was  evaluated last night.   IMPRESSION:  Suspect upper gastrointestinal bleeding, hemodynamically  stable.   PLAN:  Will proceed with EGD and proceed from there.                                               John C. Madilyn Fireman, M.D.    JCH/MEDQ  D:  05/25/2003  T:  05/25/2003  Job:  [id number]   cc:  Madaline Savage, M.D.  [address], [address]  [place]  [place] [address]  Fax: [phone]

## 2010-11-01 NOTE — Cardiovascular Report (Signed)
NAME:  Carl Gutierrez, Carl Gutierrez                          ACCOUNT NO.:  [id number]   MEDICAL RECORD NO.:  [id number]                   PATIENT TYPE:  OIB   LOCATION:  NA                                   FACILITY:  [facility]   PHYSICIAN:  Madaline Savage, M.D.             DATE OF BIRTH:  Aug 19, 1945   DATE OF PROCEDURE:  07/29/2002  DATE OF DISCHARGE:                              CARDIAC CATHETERIZATION   PROCEDURES PERFORMED:  1. Selective coronary angiography by Judkins technique.  2. Retrograde left heart catheterization.  3. Left ventricular angiography.  4. Abdominal aortography.  5. Percutaneous coronary artery stenting of the proximal intermittent ramus     branch of the left main coronary artery.  6. Percutaneous coronary stenting of the distal left anterior descending.   COMPLICATIONS:  None.   ENTRY SITE:  Right femoral.   DYE USED:  Omnipaque.  The dye load was approximately 550 mL of Omnipaque.   PATIENT PROFILE:  The patient is a 65 year old gentleman who had a recent  Cardiolite stress test to determine his eligibility for a Department of  [employer]portation physical exam and he was found to have circumflex territory  ischemia.   The patient had previously had a second obtuse marginal branch of the  circumflex angioplastied almost 10 years ago.  He has had no subsequent  catheterizations since that time.   The patient entered the catheterization lab electively by way of the short-  stay unit for diagnostic cardiac catheterization.   RESULTS:   PRESSURES:  The left ventricular pressure was 140/5, end-diastolic pressure  17, central aortic pressure 140/75, mean of 105.  No aortic valve gradient  by pullback technique.   ANGIOGRAPHIC RESULTS:  The left main coronary artery was normal.  The left  circumflex was large and dominant, giving rise to a first obtuse marginal  branch which was very large and no significant disease was noted.  The  second obtuse marginal arose from  the midportion of the circumflex and was a  small, diffusely diseased vessel containing 75% luminal irregularities  throughout its course.  It was not felt that this vessel could be intervened  upon due to the diffuse nature of his disease and also the length of the  stenosis, which was virtually the entire length of the vessel.   The distal circumflex contained a 40% stenosis in the midportion.   The intermediate ramus branch off the left main coronary artery was 80%  stenosed proximally.   The LAD coursed the cardiac apex and wrapped around the apex, giving rise to  one diagonal branch proximally.  The only lesion in the LAD of high-grade  significance was a 99% stenosis near the lower distal segment of the vessel  before the vessel wrapped around the apex.  There was a considerable amount  of vessel distal to this vein with 99% stenosis.   The right coronary artery is  nondominant.  It contained lesions in three  different places in a nondominant vessel, all of which were about 75%  severe.   The left ventricular angiogram showed good contractility, ejection fraction  of 60% and no mitral regurgitation.   PERCUTANEOUS INTERVENTION:  The first percutaneous intervention was  performed on the intermediate ramus branch of the left main.  The 80% lesion  was at a point in the vessel where there was a right-angle turn, so I  predilated this with a 2.0 x 15.0-mm Maverick.  The stent used was a 2.75 x  16.0-mm CYPHER stent which crossed the lesion with ease after predilatation  with the Maverick balloon.  I took it up twice to 15 atmospheres of  pressure, corresponding to an anticipated luminal diameter of 2.88.  The  distal vessel remained with TIMI-3 class flow and the lesion was reduced  from 80% to 0%.  The result was excellent.   I then moved to the LAD with the same left Judkins 7-French 5 guide catheter  to work on the LAD.  A Patriot wire crossed the lesion with some difficulty   and a predilatation was done with a 2.0 x 9.0-mm Maverick.  I was then able  to cross with a 2.25 x 16.0-mm Express II stent and deployed that to 15  atmospheres of pressure.  The lesion was reduced from 99% to 0% residual.   No complications occurred.  The patient received Plavix 300 mg in the  catheterization lab and also received Angiomax during the case and had an  ACT of 345.   FINAL DIAGNOSES:  1. Multivessel coronary disease with percutaneous intervention on distal     left anterior descending of 99% to 0% and percutaneous stenting of the     proximal intermediate ramus branch, 80% to 0%.  2. Diffuse disease in second obtuse marginal branch of the left circumflex     and 40% distal circumflex, neither appear to be candidates for     intervention.  3. Right coronary artery lesions, three in number, proximal, mid and in an     acute marginal branch of the right coronary artery.  4. Well-preserved left ventricular systolic function, ejection fraction 60%.                                               Madaline Savage, M.D.    WHG/MEDQ  D:  07/29/2002  T:  07/29/2002  Job:  [id number]   cc:   Ginette Otto, [facility]   [facility] Cardiac Cath Lab

## 2011-07-21 DIAGNOSIS — E230 Hypopituitarism: Secondary | ICD-10-CM | POA: Diagnosis not present

## 2011-07-21 DIAGNOSIS — I10 Essential (primary) hypertension: Secondary | ICD-10-CM | POA: Diagnosis not present

## 2011-07-21 DIAGNOSIS — E119 Type 2 diabetes mellitus without complications: Secondary | ICD-10-CM | POA: Diagnosis not present

## 2011-07-23 DIAGNOSIS — E7800 Pure hypercholesterolemia, unspecified: Secondary | ICD-10-CM | POA: Diagnosis not present

## 2011-07-23 DIAGNOSIS — E119 Type 2 diabetes mellitus without complications: Secondary | ICD-10-CM | POA: Diagnosis not present

## 2011-07-23 DIAGNOSIS — I10 Essential (primary) hypertension: Secondary | ICD-10-CM | POA: Diagnosis not present

## 2011-07-23 DIAGNOSIS — E230 Hypopituitarism: Secondary | ICD-10-CM | POA: Diagnosis not present

## 2011-07-30 DIAGNOSIS — E782 Mixed hyperlipidemia: Secondary | ICD-10-CM | POA: Diagnosis not present

## 2011-07-30 DIAGNOSIS — I10 Essential (primary) hypertension: Secondary | ICD-10-CM | POA: Diagnosis not present

## 2011-07-30 DIAGNOSIS — I2510 Atherosclerotic heart disease of native coronary artery without angina pectoris: Secondary | ICD-10-CM | POA: Diagnosis not present

## 2011-07-30 DIAGNOSIS — Z9861 Coronary angioplasty status: Secondary | ICD-10-CM | POA: Diagnosis not present

## 2011-08-13 DIAGNOSIS — E230 Hypopituitarism: Secondary | ICD-10-CM | POA: Diagnosis not present

## 2011-10-21 DIAGNOSIS — E119 Type 2 diabetes mellitus without complications: Secondary | ICD-10-CM | POA: Diagnosis not present

## 2011-10-21 DIAGNOSIS — E230 Hypopituitarism: Secondary | ICD-10-CM | POA: Diagnosis not present

## 2011-10-23 DIAGNOSIS — I10 Essential (primary) hypertension: Secondary | ICD-10-CM | POA: Diagnosis not present

## 2011-10-23 DIAGNOSIS — E119 Type 2 diabetes mellitus without complications: Secondary | ICD-10-CM | POA: Diagnosis not present

## 2011-10-23 DIAGNOSIS — E230 Hypopituitarism: Secondary | ICD-10-CM | POA: Diagnosis not present

## 2011-11-17 ENCOUNTER — Ambulatory Visit (INDEPENDENT_AMBULATORY_CARE_PROVIDER_SITE_OTHER): Payer: Medicare Other | Admitting: Internal Medicine

## 2011-11-17 ENCOUNTER — Encounter: Payer: Self-pay | Admitting: Internal Medicine

## 2011-11-17 VITALS — BP 150/88 | HR 62 | Temp 98.1°F | Resp 16 | Ht 70.0 in | Wt 233.2 lb

## 2011-11-17 DIAGNOSIS — I10 Essential (primary) hypertension: Secondary | ICD-10-CM

## 2011-11-17 DIAGNOSIS — E782 Mixed hyperlipidemia: Secondary | ICD-10-CM

## 2011-11-17 DIAGNOSIS — E230 Hypopituitarism: Secondary | ICD-10-CM | POA: Insufficient documentation

## 2011-11-17 DIAGNOSIS — E119 Type 2 diabetes mellitus without complications: Secondary | ICD-10-CM | POA: Diagnosis not present

## 2011-11-17 DIAGNOSIS — E785 Hyperlipidemia, unspecified: Secondary | ICD-10-CM | POA: Insufficient documentation

## 2011-11-17 DIAGNOSIS — E1169 Type 2 diabetes mellitus with other specified complication: Secondary | ICD-10-CM | POA: Insufficient documentation

## 2011-11-17 DIAGNOSIS — E209 Hypoparathyroidism, unspecified: Secondary | ICD-10-CM | POA: Diagnosis not present

## 2011-11-17 DIAGNOSIS — E789 Disorder of lipoprotein metabolism, unspecified: Secondary | ICD-10-CM

## 2011-11-17 LAB — POCT GLYCOSYLATED HEMOGLOBIN (HGB A1C): Hemoglobin A1C: 6

## 2011-11-17 LAB — BASIC METABOLIC PANEL
BUN: 22 mg/dL (ref 6–23)
CO2: 26 mEq/L (ref 19–32)
Calcium: 9.3 mg/dL (ref 8.4–10.5)
Chloride: 107 mEq/L (ref 96–112)
Creat: 1.36 mg/dL — ABNORMAL HIGH (ref 0.50–1.35)
Glucose, Bld: 101 mg/dL — ABNORMAL HIGH (ref 70–99)
Potassium: 4.3 mEq/L (ref 3.5–5.3)
Sodium: 140 mEq/L (ref 135–145)

## 2011-11-17 LAB — GLUCOSE, POCT (MANUAL RESULT ENTRY): POC Glucose: 114 mg/dl — AB (ref 70–99)

## 2011-11-17 MED ORDER — LISINOPRIL 10 MG PO TABS: 20.0000 mg | ORAL_TABLET | Freq: Every day | ORAL | Status: DC

## 2011-11-17 NOTE — Progress Notes (Signed)
  Subjective:    Patient ID: Carl Gutierrez, male    DOB: 12/14/1945, 66 y.o.   MRN: [id number]  HPI Panhypopit followed by Dr Erma Heritage needs work DM controlled Meds rev.    Review of Systems     Objective:   Physical Exam Normal except wt gain   Results for orders placed in visit on 11/17/11  GLUCOSE, POCT (MANUAL RESULT ENTRY)      Component Value Range   POC Glucose 114 (*) 70 - 99 (mg/dl)  POCT GLYCOSYLATED HEMOGLOBIN (HGB A1C)      Component Value Range   Hemoglobin A1C 6.0         Assessment & Plan:  HTN not to goal Increase lisinopril to 20mg  qd Wt gain See nutitionist, exercise more , eat less  RF all meds 1 year

## 2012-01-05 ENCOUNTER — Encounter: Payer: Self-pay | Admitting: Internal Medicine

## 2012-01-05 ENCOUNTER — Ambulatory Visit (INDEPENDENT_AMBULATORY_CARE_PROVIDER_SITE_OTHER): Payer: Medicare Other | Admitting: Internal Medicine

## 2012-01-05 VITALS — BP 171/88 | HR 59 | Temp 97.1°F | Resp 16 | Ht 71.0 in | Wt 231.0 lb

## 2012-01-05 DIAGNOSIS — Z7189 Other specified counseling: Secondary | ICD-10-CM

## 2012-01-05 DIAGNOSIS — I10 Essential (primary) hypertension: Secondary | ICD-10-CM

## 2012-01-05 DIAGNOSIS — Z79899 Other long term (current) drug therapy: Secondary | ICD-10-CM | POA: Diagnosis not present

## 2012-01-05 MED ORDER — LISINOPRIL 20 MG PO TABS: 20.0000 mg | ORAL_TABLET | Freq: Every day | ORAL | Status: DC

## 2012-01-05 MED ORDER — AMLODIPINE BESYLATE 10 MG PO TABS: 10.0000 mg | ORAL_TABLET | Freq: Every day | ORAL | Status: DC

## 2012-01-05 NOTE — Progress Notes (Signed)
  Subjective:    Patient ID: Carl Gutierrez, male    DOB: Jun 12, 1946, 66 y.o.   MRN: [id number]  HPI HTN has not been to goal, lisinopril incr to 20mg  qd  Review of Systems     Objective:   Physical Exam 168/94 160/82 COR nl       Assessment & Plan:  Lisinopril 20mg  qd Amlodipine 10mg  qd f/up

## 2012-01-05 NOTE — Patient Instructions (Addendum)

## 2012-01-06 ENCOUNTER — Telehealth: Payer: Self-pay

## 2012-01-06 LAB — BASIC METABOLIC PANEL
BUN: 21 mg/dL (ref 6–23)
CO2: 24 mEq/L (ref 19–32)
Calcium: 9.6 mg/dL (ref 8.4–10.5)
Chloride: 105 mEq/L (ref 96–112)
Creat: 1.32 mg/dL (ref 0.50–1.35)
Glucose, Bld: 103 mg/dL — ABNORMAL HIGH (ref 70–99)
Potassium: 4.7 mEq/L (ref 3.5–5.3)
Sodium: 137 mEq/L (ref 135–145)

## 2012-01-06 NOTE — Telephone Encounter (Signed)
[name] STATES DR GUEST SAW HER HUSBAND AND WAS GIVEN SOME MEDS, HOWEVER THEY WERE TOLD IF THE MGS WERE GREATER THAN ONE OF THE MEDS HE WAS GIVEN, NOT TO TAKE. NEED TO DISCUSS WITH SOMEONE

## 2012-01-07 NOTE — Telephone Encounter (Signed)
Wife is concerned because Dr Perrin Maltese put pt on Amlodipine 10 mg at OV and the info pamphlet states that it should not be taken with simvastatin higher than 20 mg. Pt has been on simvastatin 40 mg for a long time. Please advise if pt should take the amlodipine or if something needs to be changed.

## 2012-03-10 DIAGNOSIS — E230 Hypopituitarism: Secondary | ICD-10-CM | POA: Diagnosis not present

## 2012-03-10 DIAGNOSIS — E119 Type 2 diabetes mellitus without complications: Secondary | ICD-10-CM | POA: Diagnosis not present

## 2012-03-12 DIAGNOSIS — I10 Essential (primary) hypertension: Secondary | ICD-10-CM | POA: Diagnosis not present

## 2012-03-12 DIAGNOSIS — E230 Hypopituitarism: Secondary | ICD-10-CM | POA: Diagnosis not present

## 2012-03-12 DIAGNOSIS — E119 Type 2 diabetes mellitus without complications: Secondary | ICD-10-CM | POA: Diagnosis not present

## 2012-03-12 DIAGNOSIS — E236 Other disorders of pituitary gland: Secondary | ICD-10-CM | POA: Diagnosis not present

## 2012-04-04 DIAGNOSIS — Z23 Encounter for immunization: Secondary | ICD-10-CM | POA: Diagnosis not present

## 2012-07-12 ENCOUNTER — Encounter: Payer: Self-pay | Admitting: Internal Medicine

## 2012-07-12 ENCOUNTER — Ambulatory Visit (INDEPENDENT_AMBULATORY_CARE_PROVIDER_SITE_OTHER): Payer: Medicare Other | Admitting: Internal Medicine

## 2012-07-12 VITALS — BP 133/77 | HR 67 | Temp 97.9°F | Resp 16 | Ht 70.0 in | Wt 234.0 lb

## 2012-07-12 DIAGNOSIS — E119 Type 2 diabetes mellitus without complications: Secondary | ICD-10-CM | POA: Diagnosis not present

## 2012-07-12 DIAGNOSIS — I10 Essential (primary) hypertension: Secondary | ICD-10-CM

## 2012-07-12 DIAGNOSIS — Z125 Encounter for screening for malignant neoplasm of prostate: Secondary | ICD-10-CM

## 2012-07-12 DIAGNOSIS — Z0000 Encounter for general adult medical examination without abnormal findings: Secondary | ICD-10-CM

## 2012-07-12 LAB — COMPREHENSIVE METABOLIC PANEL
ALT: 16 U/L (ref 0–53)
AST: 11 U/L (ref 0–37)
Albumin: 4.4 g/dL (ref 3.5–5.2)
Alkaline Phosphatase: 67 U/L (ref 39–117)
BUN: 22 mg/dL (ref 6–23)
CO2: 24 mEq/L (ref 19–32)
Calcium: 9.2 mg/dL (ref 8.4–10.5)
Chloride: 109 mEq/L (ref 96–112)
Creat: 1.16 mg/dL (ref 0.50–1.35)
Glucose, Bld: 109 mg/dL — ABNORMAL HIGH (ref 70–99)
Potassium: 4.3 mEq/L (ref 3.5–5.3)
Sodium: 143 mEq/L (ref 135–145)
Total Bilirubin: 0.4 mg/dL (ref 0.3–1.2)
Total Protein: 7 g/dL (ref 6.0–8.3)

## 2012-07-12 LAB — POCT URINALYSIS DIPSTICK
Bilirubin, UA: NEGATIVE
Blood, UA: NEGATIVE
Glucose, UA: NEGATIVE
Ketones, UA: NEGATIVE
Leukocytes, UA: NEGATIVE
Nitrite, UA: NEGATIVE
Protein, UA: NEGATIVE
Spec Grav, UA: 1.02
Urobilinogen, UA: 0.2
pH, UA: 5

## 2012-07-12 LAB — PSA: PSA: 2.26 ng/mL (ref <=4.00)

## 2012-07-12 LAB — POCT UA - MICROSCOPIC ONLY
Casts, Ur, LPF, POC: NEGATIVE
Crystals, Ur, HPF, POC: NEGATIVE
Epithelial cells, urine per micros: NEGATIVE
Mucus, UA: NEGATIVE
Yeast, UA: NEGATIVE

## 2012-07-12 LAB — IFOBT (OCCULT BLOOD): IFOBT: NEGATIVE

## 2012-07-12 LAB — LIPID PANEL
Cholesterol: 154 mg/dL (ref 0–200)
HDL: 49 mg/dL (ref >39)
LDL Cholesterol: 86 mg/dL (ref 0–99)
Total CHOL/HDL Ratio: 3.1 Ratio
Triglycerides: 95 mg/dL (ref <150)
VLDL: 19 mg/dL (ref 0–40)

## 2012-07-12 LAB — TSH: TSH: 2.203 u[IU]/mL (ref 0.350–4.500)

## 2012-07-12 LAB — POCT GLYCOSYLATED HEMOGLOBIN (HGB A1C): Hemoglobin A1C: 6.5

## 2012-07-12 LAB — GLUCOSE, POCT (MANUAL RESULT ENTRY): POC Glucose: 104 mg/dl — AB (ref 70–99)

## 2012-07-12 NOTE — Patient Instructions (Addendum)
2000 Calorie Diabetic Diet  The 2000 calorie diabetic diet is designed for eating up to 2000 calories each day. Following this diet and making healthy meal choices can help improve overall health. It controls blood glucose (sugar) levels. It can also lower blood pressure and cholesterol.  SERVING SIZES  Measuring foods and serving sizes helps to make sure you are getting the right amount of food. The list below tells how big or small some common serving sizes are.  · 1 oz.........4 stacked dice.  · 3 oz.........Deck of cards.  · 1 tsp........Tip of little finger.  · 1 tbs........Thumb.  · 2 tbs........Golf ball.  · ½ cup.......Half of a fist.  · 1 cup........A fist.  GUIDELINES FOR CHOOSING FOODS  The goal of this diet is to eat a variety of foods and limit calories to 2000 each day. This can be done by choosing foods that are low in calories and fat. The diet also suggests eating small amounts of food often. Doing this helps control your blood glucose levels so they do not get too high or too low. Each meal or snack should contain a protein food source to help you feel more satisfied and to stabilize your blood glucose. Try to eat about the same amount of food around the same time each day. This includes weekend days, travel days, and days off work. Space your meals about 4 to 5 hours apart and add a snack between them if you wish.  For example, a daily food plan could include breakfast, a morning snack, lunch, dinner, and an evening snack. Healthy meals and snacks include whole grains, vegetables, fruits, lean meats, poultry, fish, and dairy products. As you plan your meals, choose a variety of foods. Choose from the bread and starches, vegetables, fruit, dairy, and meat/protein groups. Examples of foods from each group are listed below with their suggested serving sizes. Use measuring cups and spoons to become familiar with what a healthy portion looks like.  Bread and Starches  Each serving equals 15 grams of  carbohydrates.  · 1 slice bread.  · ¼ bagel.  · ¾ cup or 1 cup cold cereal (unsweetened).  · ½ cup hot cereal or mashed potatoes.  · 1 small potato (size of a computer mouse).  · ? cup cooked pasta or rice.  · ½ English muffin.  · 1 cup broth-based soup.  · 3 cups popcorn.  · 4 to 6 whole-wheat crackers.  · ½ cup cooked beans, peas, or corn.  Vegetables  Each serving equals 5 grams of carbohydrates.  · ½ cup cooked vegetables.  · 1 cup raw vegetables.  · ½ cup tomato juice.  Fruit  Each serving equals 15 grams of carbohydrates.  · 1 small apple, banana, or orange.  · 1 ¼ cup watermelon or strawberries.  · ½ cup applesauce (no sugar added).  · 2 tbs raisins.  · ½ banana.  · ½ cup unsweetened canned fruit.  · ½ cup unsweetened fruit juice.  Dairy  Each serving equals 12 to 15 grams of carbohydrates.  · 1 cup fat-free milk.  · 6 oz artificially sweetened yogurt.  · 1 cup buttermilk.  · 1 cup soy milk.  Meat/Protein  · 1 large egg.  · 2 to 3 oz meat, poultry, or fish.  · ½ cup cottage cheese.  · 1 tbs peanut butter.  · ½ cup tofu.  · 1 oz cheese.  · ¼ cup tuna canned in water.  SAMPLE   2000 CALORIE DIET PLAN  Breakfast  · 1 English muffin (2 carb servings).  · Reduced fat cream cheese, 1 tbs.  · 1 scrambled egg.  · ½ grapefruit (1 carb serving).  · Fat-free milk, 1 cup (1 carb serving).  Morning Snack  · Artificially sweetened yogurt, 6 oz (1 carb serving).  · 2 tbs chopped nuts.  · 1 small peach (1 carb serving).  Lunch  · Grilled chicken sandwich.  · 1 hamburger bun (2 carb servings).  · 2 oz chicken breast.  · 1 lettuce leaf.  · 2 slices tomato.  · Reduced fat mayonnaise, 1 tbs.  · Carrot sticks, 1 cup.  · Celery, 1 cup.  · 1 small apple (1 carb serving).  · Fat-free milk, 1 cup (1 carb serving).  Afternoon Snack  · ½ cup low-fat cottage cheese.  · 1 ¼ cups strawberries (1 carb serving).  Dinner  · Steak fajitas.  · 2 oz lean steak.  · 1 whole-wheat tortilla, 8 inches (1 ½ carb servings).  · Shredded lettuce, ¼  cup.  · 2 slices tomato.  · Salsa, ¼ cup.  · Low-fat sour cream, 2 tbs.  · Brown rice, ? cup (1 carb serving).  · 1 small orange (1 carb serving).  Evening Snack  · 4 reduced fat whole-wheat crackers (1 carb serving).  · 1 tbs peanut butter.  · 12 to 15 grapes (1 carb serving).  MEAL PLAN  Use this worksheet to help you make a daily meal plan based on the 2000 calorie diabetic diet suggestions. The total amount of carbohydrates in your meal or snack is more important than making sure you include all of the food groups at every meal or snack. If you are using this plan to help you control your blood glucose, you may interchange carbohydrate containing foods (dairy, starches, and fruits). Choose a variety of fresh foods of varying colors and flavors. You can choose from the following foods to build your day's meals:  · 11 Starches.  · 4 Vegetables.  · 3 Fruits.  · 3 Dairy.  · 8 oz Meat.  · Up to 6 Fats.  Your dietician can use this worksheet to help you decide how many servings and what types of foods are right for you.  BREAKFAST  Food Group and Servings / Food Choice  Starches ___________________________________________  Dairy ______________________________________________  Fruit ______________________________________________  Meat ______________________________________________  Fat________________________________________________  LUNCH  Food Group and Servings / Food Choice  Starch _____________________________________________  Meat ______________________________________________  Vegetables _________________________________________  Fruit ______________________________________________  Dairy______________________________________________  Fat________________________________________________  AFTERNOON SNACK  Food Group and Servings / Food  Choice  Starch________________________________________________  Meat_________________________________________________  Fruit__________________________________________________  DINNER  Food Group and Servings / Food Choice  Starches ____________________________________________  Meat _______________________________________________  Dairy _______________________________________________  Vegetables __________________________________________  Fruit ________________________________________________  Fat_________________________________________________  EVENING SNACK  Food Group and Servings / Food Choice  Fruit _______________________________________________  Meat _______________________________________________  Starch ______________________________________________  DAILY TOTALS  Starches ________________________  Vegetables ______________________  Fruit ___________________________  Dairy ___________________________  Meat ___________________________  Fat _____________________________  Document Released: 12/23/2004 Document Revised: 08/25/2011 Document Reviewed: 01/08/2009  [employer]® Patient Information ©2013 [employer].

## 2012-07-12 NOTE — Progress Notes (Signed)
  Subjective:    Patient ID: Carl Gutierrez, male    DOB: November 26, 1945, 67 y.o.   MRN: [id number]  HPI Dr. Lucianne Muss his endocrinologist for his panhypopituitary disorder. HTN and Hyperlipidemia and NIDDM are txed and controlled. Balance is good and no depression.   Review of Systems  Constitutional: Negative.   HENT: Negative.   Eyes: Negative.   Respiratory: Negative.   Cardiovascular: Negative.   Gastrointestinal: Negative.   Genitourinary: Negative.   Musculoskeletal: Positive for back pain and arthralgias.  Neurological: Negative.   Hematological: Negative.   Psychiatric/Behavioral: Negative.        Objective:   Physical Exam  Vitals reviewed. Constitutional: He is oriented to person, place, and time. He appears well-developed and well-nourished. No distress.  HENT:  Right Ear: External ear normal.  Left Ear: External ear normal.  Nose: Nose normal.  Mouth/Throat: Oropharynx is clear and moist.  Eyes: Conjunctivae normal and EOM are normal. Pupils are equal, round, and reactive to light.  Neck: Normal range of motion. Neck supple. No tracheal deviation present. No thyromegaly present.  Cardiovascular: Normal rate, regular rhythm and normal heart sounds.   Pulmonary/Chest: Effort normal and breath sounds normal.  Abdominal: Soft. Bowel sounds are normal. He exhibits no mass.  Genitourinary: Rectum normal, prostate normal and penis normal.  Musculoskeletal: He exhibits tenderness.  Lymphadenopathy:    He has no cervical adenopathy.  Neurological: He is alert and oriented to person, place, and time. He exhibits normal muscle tone. Coordination normal.  Skin: Skin is warm. No rash noted.  Psychiatric: He has a normal mood and affect. His behavior is normal. Judgment and thought content normal.      Results for orders placed in visit on 07/12/12  POCT UA - MICROSCOPIC ONLY      Component Value Range   WBC, Ur, HPF, POC 1-3     RBC, urine, microscopic 0-1     Bacteria, U  Microscopic trace     Mucus, UA neg     Epithelial cells, urine per micros neg     Crystals, Ur, HPF, POC neg     Casts, Ur, LPF, POC neg     Yeast, UA neg    POCT URINALYSIS DIPSTICK      Component Value Range   Color, UA yellow     Clarity, UA clear     Glucose, UA neg     Bilirubin, UA neg     Ketones, UA neg     Spec Grav, UA 1.020     Blood, UA neg     pH, UA 5.0     Protein, UA neg     Urobilinogen, UA 0.2     Nitrite, UA neg     Leukocytes, UA Negative    Gl 104 and a1c 6.5     Assessment & Plan:  All issues stable and controlled  RF meds 1 year. DOT filled out

## 2012-08-06 DIAGNOSIS — E782 Mixed hyperlipidemia: Secondary | ICD-10-CM | POA: Diagnosis not present

## 2012-08-06 DIAGNOSIS — I2510 Atherosclerotic heart disease of native coronary artery without angina pectoris: Secondary | ICD-10-CM | POA: Diagnosis not present

## 2012-08-06 DIAGNOSIS — I10 Essential (primary) hypertension: Secondary | ICD-10-CM | POA: Diagnosis not present

## 2012-08-06 DIAGNOSIS — Z9861 Coronary angioplasty status: Secondary | ICD-10-CM | POA: Diagnosis not present

## 2012-09-06 DIAGNOSIS — E119 Type 2 diabetes mellitus without complications: Secondary | ICD-10-CM | POA: Diagnosis not present

## 2012-09-06 DIAGNOSIS — E236 Other disorders of pituitary gland: Secondary | ICD-10-CM | POA: Diagnosis not present

## 2012-09-06 DIAGNOSIS — E230 Hypopituitarism: Secondary | ICD-10-CM | POA: Diagnosis not present

## 2012-09-13 DIAGNOSIS — I10 Essential (primary) hypertension: Secondary | ICD-10-CM | POA: Diagnosis not present

## 2012-09-13 DIAGNOSIS — E230 Hypopituitarism: Secondary | ICD-10-CM | POA: Diagnosis not present

## 2012-09-13 DIAGNOSIS — E236 Other disorders of pituitary gland: Secondary | ICD-10-CM | POA: Diagnosis not present

## 2012-09-13 DIAGNOSIS — IMO0001 Reserved for inherently not codable concepts without codable children: Secondary | ICD-10-CM | POA: Diagnosis not present

## 2012-10-19 DIAGNOSIS — IMO0001 Reserved for inherently not codable concepts without codable children: Secondary | ICD-10-CM | POA: Diagnosis not present

## 2012-11-02 DIAGNOSIS — E119 Type 2 diabetes mellitus without complications: Secondary | ICD-10-CM | POA: Diagnosis not present

## 2012-11-12 DIAGNOSIS — IMO0001 Reserved for inherently not codable concepts without codable children: Secondary | ICD-10-CM | POA: Diagnosis not present

## 2012-11-12 DIAGNOSIS — E236 Other disorders of pituitary gland: Secondary | ICD-10-CM | POA: Diagnosis not present

## 2012-11-16 DIAGNOSIS — I10 Essential (primary) hypertension: Secondary | ICD-10-CM | POA: Diagnosis not present

## 2012-11-16 DIAGNOSIS — E119 Type 2 diabetes mellitus without complications: Secondary | ICD-10-CM | POA: Diagnosis not present

## 2012-11-16 DIAGNOSIS — E236 Other disorders of pituitary gland: Secondary | ICD-10-CM | POA: Diagnosis not present

## 2012-11-16 DIAGNOSIS — E230 Hypopituitarism: Secondary | ICD-10-CM | POA: Diagnosis not present

## 2012-12-13 DIAGNOSIS — E119 Type 2 diabetes mellitus without complications: Secondary | ICD-10-CM | POA: Diagnosis not present

## 2013-01-10 ENCOUNTER — Ambulatory Visit: Payer: Medicare Other | Admitting: Internal Medicine

## 2013-01-17 ENCOUNTER — Ambulatory Visit (INDEPENDENT_AMBULATORY_CARE_PROVIDER_SITE_OTHER): Payer: Medicare Other | Admitting: Internal Medicine

## 2013-01-17 ENCOUNTER — Encounter: Payer: Self-pay | Admitting: Internal Medicine

## 2013-01-17 VITALS — BP 130/80 | HR 63 | Temp 97.9°F | Resp 16 | Ht 70.0 in | Wt 231.2 lb

## 2013-01-17 DIAGNOSIS — E119 Type 2 diabetes mellitus without complications: Secondary | ICD-10-CM | POA: Diagnosis not present

## 2013-01-17 DIAGNOSIS — Z79899 Other long term (current) drug therapy: Secondary | ICD-10-CM | POA: Diagnosis not present

## 2013-01-17 DIAGNOSIS — I10 Essential (primary) hypertension: Secondary | ICD-10-CM | POA: Diagnosis not present

## 2013-01-17 LAB — POCT GLYCOSYLATED HEMOGLOBIN (HGB A1C): Hemoglobin A1C: 5.9

## 2013-01-17 LAB — GLUCOSE, POCT (MANUAL RESULT ENTRY): POC Glucose: 96 mg/dl (ref 70–99)

## 2013-01-17 NOTE — Patient Instructions (Signed)
Medial Head Gastrocnemius Tear (Tennis Leg)  with Rehab Medial head gastrocnemius tear, also called tennis leg, is a tear (strain) in a muscle or tendon of the inner portion (medial head) of one of the calf muscles (gastrocnemius). The inner portion of the calf muscle attaches to the thigh bone (femur) and is responsible for bending the knee and straightening the foot (standing on "tippy toes"). Strains are classified into three categories. Grade 1 strains cause pain, but the tendon is not lengthened. Grade 2 strains include a lengthened ligament, due to the ligament being stretched or partially ruptured. With grade 2 strains there is still function, although function may be decreased. Grade 3 strains involve a complete tear of the tendon or muscle, and function is usually impaired. SYMPTOMS   Sudden "pop" or tear felt at the time of injury.  Pain, tenderness, swelling, warmth, or redness over the middle inner calf.  Pain and weakness with ankle motion, especially flexing the ankle against resistance, as well as pain with lifting the foot up (extending the ankle).  Bruising (contusion) of the calf, heel and, sometimes, foot within 48 hours of injury.  Muscle spasm in the calf. CAUSES  Muscle and ligament strains occur when a force is placed on the muscle or ligament that is greater than it can handle. Common causes of injury include:  Direct hit (trauma) to the calf.  Sudden forceful pushing off or landing on the foot (jumping, landing, serving a tennis ball, lunging). RISK INCREASES WITH:  Sports that require sudden, explosive calf muscle contraction, such as those involving jumping (basketball), hill running, quick starts (running), or lunging (racquetball, tennis).  Contact sports (football, soccer, hockey).  Poor strength and flexibility.  Previous lower limb injury. PREVENTION  Warm up and stretch properly before activity.  Allow for adequate recovery between  workouts.  Maintain physical fitness:  Strength, flexibility, and endurance.  Cardiovascular fitness.  Learn and use proper exercise technique.  Complete rehabilitation after lower limb injury, before returning to competition or practice. PROGNOSIS  If treated properly, tennis leg usually heals within 6 weeks of non-surgical treatment.  RELATED COMPLICATIONS   Longer healing time, if not properly treated or if not given enough time to heal.  Recurring symptoms and injury, if activity is resumed too soon, with overuse, with a direct blow, or with poor technique.  If untreated, may progress to a complete tear (rare) or other injury, due to limping and favoring of the injured leg.  Persistent limping, due to scarring and shortening of the calf muscles, as a result of inadequate rehabilitation.  Prolonged disability. TREATMENT  Treatment first involves the use of ice and medication to help reduce pain and inflammation. The use of strengthening and stretching exercises may help reduce pain with activity. These exercises may be performed at home or with a therapist. For severe injuries, referral to a therapist may be needed for further evaluation and treatment. Your caregiver may advise that you wear a brace to help healing. Sometimes, crutches are needed until you can walk without limping. Rarely, surgery is needed.  MEDICATION   If pain medicine is needed, nonsteroidal anti-inflammatory medicines (aspirin and ibuprofen), or other minor pain relievers (acetaminophen), are often advised.  Do not take pain medicine for 7 days before surgery.  Prescription pain relievers may be given, if your caregiver thinks they are needed. Use only as directed and only as much as you need. HEAT AND COLD  Cold treatment (icing) should be applied for 10 to   15 minutes every 2 to 3 hours for inflammation and pain, and immediately after activity that aggravates your symptoms. Use ice packs or an ice  massage.  Heat treatment may be used before performing stretching and strengthening activities prescribed by your caregiver, physical therapist, or athletic trainer. Use a heat pack or a warm water soak. SEEK MEDICAL CARE IF:   Symptoms get worse or do not improve in 2 weeks, despite treatment.  Numbness or tingling develops.  New, unexplained symptoms develop. (Drugs used in treatment may produce side effects.) EXERCISES  RANGE OF MOTION (ROM) AND STRETCHING EXERCISES - Medial Head Gastrocnemius Tear (Tennis Leg) These exercises may help you when beginning to rehabilitate your injury. Your symptoms may resolve with or without further involvement from your physician, physical therapist or athletic trainer. While completing these exercises, remember:   Restoring tissue flexibility helps normal motion to return to the joints. This allows healthier, less painful movement and activity.  An effective stretch should be held for at least 30 seconds.  A stretch should never be painful. You should only feel a gentle lengthening or release in the stretched tissue. STRETCH - Gastrocsoleus  Sit with your right / left leg extended. Holding onto both ends of a belt or towel, loop it around the ball of your foot.  Keeping your right / left ankle and foot relaxed and your knee straight, pull your foot and ankle toward you using the belt.  You should feel a gentle stretch behind your calf or knee. Hold this position for __________ seconds. Repeat __________ times. Complete this stretch __________ times per day.  RANGE OF MOTION - Ankle Dorsiflexion, Active Assisted   Remove your shoes and sit on a chair, preferably not on a carpeted surface.  Place your right / left foot directly under the knee. Extend your opposite leg for support.  Keeping your heel down, slide your right / left foot back toward the chair, until you feel a stretch at your ankle or calf. If you do not feel a stretch, slide your  bottom forward to the edge of the chair, while still keeping your heel down.  Hold this stretch for __________ seconds. Repeat __________ times. Complete this stretch __________ times per day.  STRETCH  Gastroc, Standing   Place your hands on a wall.  Extend your right / left leg behind you, keeping the front knee somewhat bent.  Slightly point your toes inward on your back foot.  Keeping your right / left heel on the floor and your knee straight, shift your weight toward the wall, not allowing your back to arch.  You should feel a gentle stretch in the right / left calf. Hold this position for __________ seconds. Repeat __________ times. Complete this stretch __________ times per day. STRETCH  Soleus, Standing   Place your hands on a wall.  Extend your right / left leg behind you, keeping the other knee somewhat bent.  Slightly point your toes inward on your back foot.  Keep your right / left heel on the floor, bend your back knee, and slightly shift your weight over the back leg so that you feel a gentle stretch deep in your back calf.  Hold this position for __________ seconds. Repeat __________ times. Complete this stretch __________ times per day. STRETCH  Gastrocsoleus, Standing Note: This exercise can place a lot of stress on your foot and ankle. Please complete this exercise only if specifically instructed by your caregiver.   Place the ball   of your right / left foot on a step, keeping your other foot firmly on the same step.  Hold on to the wall or a rail for balance.  Slowly lift your other foot, allowing your body weight to press your heel down over the edge of the step.  You should feel a stretch in your right / left calf.  Hold this position for __________ seconds.  Repeat this exercise with a slight bend in your right / left knee. Repeat __________ times. Complete this stretch __________ times per day.  STRENGTHENING EXERCISES - Medial Head Gastrocnemius Tear  (Tennis Leg) These exercises may help you when beginning to rehabilitate your injury. They may resolve your symptoms with or without further involvement from your physician, physical therapist or athletic trainer. While completing these exercises, remember:   Muscles can gain both the endurance and the strength needed for everyday activities through controlled exercises.  Complete these exercises as instructed by your physician, physical therapist or athletic trainer. Increase the resistance and repetitions only as guided by your caregiver. STRENGTH - Plantar-flexors  Sit with your right / left leg extended. Holding onto both ends of a rubber exercise band or tubing, loop it around the ball of your foot. Keep a slight tension in the band.  Slowly push your toes away from you, pointing them downward.  Hold this position for __________ seconds. Return slowly, controlling the tension in the band. Repeat __________ times. Complete this exercise __________ times per day.  STRENGTH - Plantar-flexors  Stand with your feet shoulder width apart. Steady yourself with a wall or table, using as little support as needed.  Keeping your weight evenly spread over the width of your feet, rise up on your toes.*  Hold this position for __________ seconds. Repeat __________ times. Complete this exercise __________ times per day.  *If this is too easy, shift your weight toward your right / left leg until you feel challenged. Ultimately, you may be asked to do this exercise while standing on your right / left foot only. STRENGTH  Plantar-flexors, Eccentric Note: This exercise can place a lot of stress on your foot and ankle. Please complete this exercise only if specifically instructed by your caregiver.   Place the balls of your feet on a step. With your hands, use only enough support from a wall or rail to keep your balance.  Keep your knees straight and rise up on your toes.  Slowly shift your weight  entirely to your right / left toes and pick up your opposite foot. Gently and with controlled movement, lower your weight through your right / left foot so that your heel drops below the level of the step. You will feel a slight stretch in the back of your right / left calf.  Use the healthy leg to help rise up onto the balls of both feet, then lower weight only onto the right / left leg again. Build up to 15 repetitions. Then progress to 3 sets of 15 repetitions.*  After completing the above exercise, complete the same exercise with a slight knee bend (about 30 degrees). Again, build up to 15 repetitions. Then progress to 3 sets of 15 repetitions.* Perform this exercise __________ times per day.  *When you easily complete 3 sets of 15, your physician, physical therapist or athletic trainer may advise you to add resistance, by wearing a backpack filled with additional weight. Document Released: 06/02/2005 Document Revised: 08/25/2011 Document Reviewed: 09/14/2008 [employer] Patient Information 2014 [employer].  

## 2013-01-17 NOTE — Progress Notes (Signed)
  Subjective:    Patient ID: Carl Gutierrez, male    DOB: 16-Oct-1945, 67 y.o.   MRN: [id number]  HPI NIDDM well controlled. 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 Lucianne Muss. HTN controlled, meds tolerated. Wife doing well.   Review of Systems Leg cramps left calf, no weakness or numbness    Objective:   Physical Exam  Vitals reviewed. Constitutional: He is oriented to person, place, and time. He appears well-developed and well-nourished. No distress.  Eyes: EOM are normal.  Neck: Normal range of motion. Neck supple. Thyromegaly present.  Cardiovascular: Normal rate, regular rhythm and normal heart sounds.   Pulmonary/Chest: Effort normal and breath sounds normal.  Abdominal: Soft. He exhibits no mass. There is no tenderness.  Musculoskeletal: Normal range of motion.  Lymphadenopathy:    He has no cervical adenopathy.    He has no axillary adenopathy.  Neurological: He is alert and oriented to person, place, and time. He exhibits normal muscle tone. Coordination normal.  Psychiatric: He has a normal mood and affect.     Results for orders placed in visit on 01/17/13  GLUCOSE, POCT (MANUAL RESULT ENTRY)      Result Value Range   POC Glucose 96  70 - 99 mg/dl  POCT GLYCOSYLATED HEMOGLOBIN (HGB A1C)      Result Value Range   Hemoglobin A1C 5.9          Assessment & Plan:  All issues stable controlled Left calf cramps

## 2013-01-18 LAB — COMPREHENSIVE METABOLIC PANEL WITH GFR
ALT: 19 U/L (ref 0–53)
AST: 16 U/L (ref 0–37)
Albumin: 4 g/dL (ref 3.5–5.2)
Alkaline Phosphatase: 59 U/L (ref 39–117)
BUN: 17 mg/dL (ref 6–23)
CO2: 25 meq/L (ref 19–32)
Calcium: 9.2 mg/dL (ref 8.4–10.5)
Chloride: 106 meq/L (ref 96–112)
Creat: 1.14 mg/dL (ref 0.50–1.35)
Glucose, Bld: 100 mg/dL — ABNORMAL HIGH (ref 70–99)
Potassium: 4.3 meq/L (ref 3.5–5.3)
Sodium: 140 meq/L (ref 135–145)
Total Bilirubin: 0.4 mg/dL (ref 0.3–1.2)
Total Protein: 6.6 g/dL (ref 6.0–8.3)

## 2013-01-21 ENCOUNTER — Other Ambulatory Visit: Payer: Self-pay | Admitting: Internal Medicine

## 2013-02-16 ENCOUNTER — Other Ambulatory Visit: Payer: Self-pay | Admitting: Internal Medicine

## 2013-02-18 ENCOUNTER — Other Ambulatory Visit: Payer: Self-pay | Admitting: *Deleted

## 2013-02-18 MED ORDER — METFORMIN HCL 500 MG PO TABS: ORAL_TABLET | ORAL | Status: DC

## 2013-03-18 ENCOUNTER — Other Ambulatory Visit: Payer: Medicare Other

## 2013-03-25 ENCOUNTER — Other Ambulatory Visit: Payer: Medicare Other

## 2013-03-25 ENCOUNTER — Encounter: Payer: Self-pay | Admitting: Endocrinology

## 2013-03-25 ENCOUNTER — Ambulatory Visit (INDEPENDENT_AMBULATORY_CARE_PROVIDER_SITE_OTHER): Payer: Medicare Other | Admitting: Endocrinology

## 2013-03-25 VITALS — BP 158/100 | HR 72 | Temp 98.4°F | Resp 12 | Ht 67.0 in | Wt 237.5 lb

## 2013-03-25 DIAGNOSIS — E230 Hypopituitarism: Secondary | ICD-10-CM | POA: Diagnosis not present

## 2013-03-25 DIAGNOSIS — E119 Type 2 diabetes mellitus without complications: Secondary | ICD-10-CM | POA: Diagnosis not present

## 2013-03-25 DIAGNOSIS — E789 Disorder of lipoprotein metabolism, unspecified: Secondary | ICD-10-CM

## 2013-03-25 DIAGNOSIS — I10 Essential (primary) hypertension: Secondary | ICD-10-CM

## 2013-03-25 LAB — COMPREHENSIVE METABOLIC PANEL
ALT: 26 U/L (ref 0–53)
AST: 18 U/L (ref 0–37)
Albumin: 4.2 g/dL (ref 3.5–5.2)
Alkaline Phosphatase: 63 U/L (ref 39–117)
BUN: 19 mg/dL (ref 6–23)
CO2: 25 mEq/L (ref 19–32)
Calcium: 9.4 mg/dL (ref 8.4–10.5)
Chloride: 105 mEq/L (ref 96–112)
Creatinine, Ser: 1.2 mg/dL (ref 0.4–1.5)
GFR: 77.13 mL/min (ref >60.00)
Glucose, Bld: 120 mg/dL — ABNORMAL HIGH (ref 70–99)
Potassium: 4.6 mEq/L (ref 3.5–5.1)
Sodium: 141 mEq/L (ref 135–145)
Total Bilirubin: 0.5 mg/dL (ref 0.3–1.2)
Total Protein: 7.5 g/dL (ref 6.0–8.3)

## 2013-03-25 LAB — LIPID PANEL
Cholesterol: 183 mg/dL (ref 0–200)
HDL: 48.9 mg/dL (ref >39.00)
LDL Cholesterol: 105 mg/dL — ABNORMAL HIGH (ref 0–99)
Total CHOL/HDL Ratio: 4
Triglycerides: 147 mg/dL (ref 0.0–149.0)
VLDL: 29.4 mg/dL (ref 0.0–40.0)

## 2013-03-25 LAB — TESTOSTERONE: Testosterone: 135.02 ng/dL — ABNORMAL LOW (ref 350.00–890.00)

## 2013-03-25 LAB — MICROALBUMIN / CREATININE URINE RATIO
Creatinine,U: 113.9 mg/dL
Microalb Creat Ratio: 0.8 mg/g (ref 0.0–30.0)
Microalb, Ur: 0.9 mg/dL (ref 0.0–1.9)

## 2013-03-25 LAB — T4, FREE: Free T4: 1.03 ng/dL (ref 0.60–1.60)

## 2013-03-25 NOTE — Patient Instructions (Signed)
Please check blood sugars at least half the time about 2 hours after any meal and as directed on waking up. Please bring blood sugar monitor to each visit  Walk daily  Use Androgel daily, will check labs

## 2013-03-25 NOTE — Progress Notes (Signed)
Carl Gutierrez is a 67 y.o. male.   Chief complaint: Followup  History of Present Illness:  HYPOPITUITARISM: This is secondary to his pituitary tumor which was treated surgically in 07/2010  He has generally been asymptomatic with his multiple hormone deficiencies except at the time of diagnosis. 1. He has had marked hypogonadism at baseline levels of about 18. Initially had decreased libido, some fatigue and also erectile dysfunction  Again difficult to assess adequacy of his testosterone supplementation since he usually denies any symptoms even when his testosterone levels are very low. His previous testosterone levels have ranged from 64-252 and the last level was 110 Still continues to be asymptomatic. He had been variably compliant with his testosterone supplements before and he says that he is taking 2 pounds on each arm every morning now as directed 2. Secondary hypothyroidism: Does not complain of any fatigue, has been regular with his levothyroxine supplements of 100 mcg. His last free T4 was 0.88 3. Cortisone deficiency: He is taking his supplement quite regularly, 20 mg midmorning and 10 mg in the early evening, no unusual fatigue, orthostatic lightheadedness or nausea 4. Because of his lack of symptoms growth hormone levels have not been evaluated. Also has not had any diabetes insipidus  HYPERTENSION: He thinks his blood pressure is normal at home, checking only occasionally  DIABETES: He has had mild diabetes for the last few years and treated by his primary care physician with low dose metformin only He was seen by the dietitian in 6/14 and was instructed on cut back on high fat foods, balanced breakfast However he has not lost any weight His home blood sugars are 90-100, and he thinks they are the same in the morning or evening He is not doing any regular exercise but does some yard work or walking 15-20 minutes at times  Lab Results  Component Value Date   HGBA1C 5.9  01/17/2013        Medication List       This list is accurate as of: 03/25/13  9:50 AM.  Always use your most recent med list.               amLODipine 10 MG tablet  Commonly known as:  NORVASC  TAKE 1 TABLET (10 MG TOTAL) BY MOUTH DAILY.     ANDROGEL PUMP TD  Place onto the skin.     aspirin 81 MG tablet  Take 81 mg by mouth daily.     hydrocortisone 20 MG tablet  Commonly known as:  CORTEF  Take 20 mg by mouth daily.     hydrocortisone 10 MG tablet  Commonly known as:  CORTEF  Take 10 mg by mouth daily.     levothyroxine 75 MCG tablet  Commonly known as:  SYNTHROID, LEVOTHROID  Take 75 mcg by mouth daily.     lisinopril 10 MG tablet  Commonly known as:  PRINIVIL,ZESTRIL  Take 2 tablets (20 mg total) by mouth daily.     lisinopril 20 MG tablet  Commonly known as:  PRINIVIL,ZESTRIL  TAKE 1 TABLET (20 MG TOTAL) BY MOUTH DAILY.     metFORMIN 500 MG tablet  Commonly known as:  GLUCOPHAGE  Take 2 tablets with evening meal daily     OVER THE COUNTER MEDICATION  Vitamin D3 2000 units take in the evening     simvastatin 40 MG tablet  Commonly known as:  ZOCOR  Take 40 mg by mouth every evening.  Allergies:  Allergies  Allergen Reactions  . Other     Blood pressure med name unknown.    Past Medical History  Diagnosis Date  . Panhypopituitary dwarfism   . Hyperlipidemia   . Hypertension   . Diabetes mellitus     Past Surgical History  Procedure Laterality Date  . Brain surgery    . Spine surgery      No family history on file.  Social History:  reports that he has quit smoking. He does not have any smokeless tobacco history on file. He reports that he does not drink alcohol or use illicit drugs.  Review of Systems   History of erectile dysfunction Has previous history of knee surgery  No visits with results within 1 Week(s) from this visit. Latest known visit with results is:  Office Visit on 01/17/2013  Component Date Value  Range Status  . POC Glucose 01/17/2013 96  70 - 99 mg/dl Final  . Hemoglobin M0N 01/17/2013 5.9   Final  . Sodium 01/17/2013 140  135 - 145 mEq/L Final  . Potassium 01/17/2013 4.3  3.5 - 5.3 mEq/L Final  . Chloride 01/17/2013 106  96 - 112 mEq/L Final  . CO2 01/17/2013 25  19 - 32 mEq/L Final  . Glucose, Bld 01/17/2013 100* 70 - 99 mg/dL Final  . BUN 02/72/5366 17  6 - 23 mg/dL Final  . Creat 44/08/4740 1.14  0.50 - 1.35 mg/dL Final  . Total Bilirubin 01/17/2013 0.4  0.3 - 1.2 mg/dL Final  . Alkaline Phosphatase 01/17/2013 59  39 - 117 U/L Final  . AST 01/17/2013 16  0 - 37 U/L Final  . ALT 01/17/2013 19  0 - 53 U/L Final  . Total Protein 01/17/2013 6.6  6.0 - 8.3 g/dL Final  . Albumin 59/56/3875 4.0  3.5 - 5.2 g/dL Final  . Calcium 64/33/2951 9.2  8.4 - 10.5 mg/dL Final    EXAM:  BP 884/166  Pulse 72  Temp(Src) 98.4 F (36.9 C)  Resp 12  Ht 5\' 7"  (1.702 m)  Wt 237 lb 8 oz (107.729 kg)  BMI 37.19 kg/m2  SpO2 97% Repeat BP standing 140/80  Assessment/Plan:   1. Panhypopituitarism: He has generally been asymptomatic with his multiple hormone deficiencies except at the time of diagnosis. Again difficult to assess adequacy of his testosterone supplementation since he usually denies any symptoms even when his testosterone levels are very low. Still continues to be asymptomatic. Will need to check his testosterone level to see if his level has improved with increasing the dose on the last visit; also had changed the application time to the morning when he has less likelihood of contact transference. He again said that he is compliant although this has been somewhat doubtful previously. Also will check his free T4 levels today as well as electrolytes. No history of diabetes insipidus and no symptoms of polyuria 2. Hyperlipidemia: To have lipids checked in followup 3. Diabetes: His A1c appears to be better as of August after having a session with the dietitian in June. However he is  checking his blood sugars only sporadically. Encouraged him to be more active with regular exercise which he is not doing. Discussed following instructions from dietitian especially reducing fat intake which he has not done recently on vacation. Currently only on low dose metformin. To have urine microalbumin checked. Exam done by PCP 4. Hypertension: Blood pressure appears reasonably controlled and encouraged him to check it more regularly at home.  Also followed by PCP   [facility] 03/25/2013, 9:50 AM   Addendum: Testosterone level still very low, have recommended injections instead of AndroGel  Office Visit on 03/25/2013  Component Date Value Range Status  . Cholesterol 03/25/2013 183  0 - 200 mg/dL Final   ATP III Classification       Desirable:  < 200 mg/dL               Borderline High:  200 - 239 mg/dL          High:  > = 865 mg/dL  . Triglycerides 03/25/2013 147.0  0.0 - 149.0 mg/dL Final   Normal:  <784 mg/dLBorderline High:  150 - 199 mg/dL  . HDL 03/25/2013 48.90  >39.00 mg/dL Final  . VLDL 69/62/9528 29.4  0.0 - 40.0 mg/dL Final  . LDL Cholesterol 03/25/2013 105* 0 - 99 mg/dL Final  . Total CHOL/HDL Ratio 03/25/2013 4   Final                  Men          Women1/2 Average Risk     3.4          3.3Average Risk          5.0          4.42X Average Risk          9.6          7.13X Average Risk          15.0          11.0                      . Testosterone 03/25/2013 135.02* 350.00 - 890.00 ng/dL Final  . Sodium 41/32/4401 141  135 - 145 mEq/L Final  . Potassium 03/25/2013 4.6  3.5 - 5.1 mEq/L Final  . Chloride 03/25/2013 105  96 - 112 mEq/L Final  . CO2 03/25/2013 25  19 - 32 mEq/L Final  . Glucose, Bld 03/25/2013 120* 70 - 99 mg/dL Final  . BUN 02/72/5366 19  6 - 23 mg/dL Final  . Creatinine, Ser 03/25/2013 1.2  0.4 - 1.5 mg/dL Final  . Total Bilirubin 03/25/2013 0.5  0.3 - 1.2 mg/dL Final  . Alkaline Phosphatase 03/25/2013 63  39 - 117 U/L Final  . AST 03/25/2013 18  0 - 37  U/L Final  . ALT 03/25/2013 26  0 - 53 U/L Final  . Total Protein 03/25/2013 7.5  6.0 - 8.3 g/dL Final  . Albumin 44/08/4740 4.2  3.5 - 5.2 g/dL Final  . Calcium 59/56/3875 9.4  8.4 - 10.5 mg/dL Final  . GFR 64/33/2951 77.13  >60.00 mL/min Final  . Microalb, Ur 03/25/2013 0.9  0.0 - 1.9 mg/dL Final  . Creatinine,U 88/41/6606 113.9   Final  . Microalb Creat Ratio 03/25/2013 0.8  0.0 - 30.0 mg/g Final  . Free T4 03/25/2013 1.03  0.60 - 1.60 ng/dL Final

## 2013-03-29 ENCOUNTER — Other Ambulatory Visit: Payer: Self-pay | Admitting: *Deleted

## 2013-03-29 MED ORDER — TESTOSTERONE CYPIONATE 200 MG/ML IM SOLN: 300.0000 mg | INTRAMUSCULAR | Status: DC

## 2013-03-31 ENCOUNTER — Other Ambulatory Visit: Payer: Medicare Other

## 2013-04-20 ENCOUNTER — Other Ambulatory Visit: Payer: Self-pay | Admitting: Physician Assistant

## 2013-06-20 ENCOUNTER — Encounter: Payer: Medicare Other | Admitting: Internal Medicine

## 2013-06-24 ENCOUNTER — Encounter: Payer: Self-pay | Admitting: Cardiology

## 2013-06-24 ENCOUNTER — Ambulatory Visit (INDEPENDENT_AMBULATORY_CARE_PROVIDER_SITE_OTHER): Payer: Medicare Other | Admitting: Cardiology

## 2013-06-24 VITALS — BP 150/90 | HR 69 | Ht 69.5 in | Wt 244.8 lb

## 2013-06-24 DIAGNOSIS — Z9861 Coronary angioplasty status: Secondary | ICD-10-CM | POA: Diagnosis not present

## 2013-06-24 DIAGNOSIS — E669 Obesity, unspecified: Secondary | ICD-10-CM | POA: Diagnosis not present

## 2013-06-24 DIAGNOSIS — E785 Hyperlipidemia, unspecified: Secondary | ICD-10-CM | POA: Diagnosis not present

## 2013-06-24 DIAGNOSIS — I2510 Atherosclerotic heart disease of native coronary artery without angina pectoris: Secondary | ICD-10-CM | POA: Diagnosis not present

## 2013-06-24 DIAGNOSIS — I10 Essential (primary) hypertension: Secondary | ICD-10-CM | POA: Diagnosis not present

## 2013-06-24 MED ORDER — HYDROCHLOROTHIAZIDE 25 MG PO TABS: 25.0000 mg | ORAL_TABLET | Freq: Every day | ORAL | Status: DC

## 2013-06-24 NOTE — Patient Instructions (Signed)
Start HCTZ 25 MG ONE DAILY TAKE WITH YOUR LISINOPRIL  CONTINUE ALL OTHER MEDICATION  Your physician wants you to follow-up in 32 MONTH WITH Dr Ellyn Hack.  You will receive a reminder letter in the mail two months in advance. If you don't receive a letter, please call our office to schedule the follow-up appointment.

## 2013-06-26 ENCOUNTER — Encounter: Payer: Self-pay | Admitting: Cardiology

## 2013-06-26 DIAGNOSIS — I10 Essential (primary) hypertension: Secondary | ICD-10-CM | POA: Insufficient documentation

## 2013-06-26 DIAGNOSIS — E669 Obesity, unspecified: Secondary | ICD-10-CM | POA: Insufficient documentation

## 2013-06-26 NOTE — Assessment & Plan Note (Signed)
Stable with no symptoms. On aspirin, statin, and ACE inhibitor. He is on dihydropyridine calcium channel blocker but not a beta blocker. Presumably secondary to bradycardia in the past.

## 2013-06-26 NOTE — Assessment & Plan Note (Addendum)
He knows he gained more weight than he usually has had. He needs to work on dietary modification and increased exercise level. Goal weight loss would be 24 pounds in a year which would probably get him out of the obesity criteria.

## 2013-06-26 NOTE — Progress Notes (Signed)
PATIENT: Carl Gutierrez MRN: [id number]  DOB: November 21, 1945   DOV:06/26/2013 PCP: Kennon Portela, MD  Clinic Note: Chief Complaint  Patient presents with  . Annual Exam    no complaints; lipids in October   HPI: Carl Gutierrez is a 68 y.o.  male with a PMH below who presents today for annual followup of coronary disease and hypertension. I last saw him in February of 2014  Interval History: He presents today with no major complaints. No chest pain or dyspnea with with rest or exertion. No PND, orthopnea or edema. No lightheadedness no dizziness no wheezes, syncope or near syncope.  No TIA or amaurosis fugax symptoms. No rapid or irregular heartbeats. No melena, and sheet or hematuria. No claudication. He does note occasional leg class at night. He is bothered by pain mostly in his left knee with walking that is really that has really limited his exercise. With decreased exercise, and the holidays, he is put on her event of weight.  Past Medical History  Diagnosis Date  . Panhypopituitary dwarfism     Status post pituitary adenoma removal in 2012  . Hyperlipidemia   . Hypertension   . Diabetes mellitus     Low-dose metformin  . CAD S/P percutaneous coronary angioplasty February 2004    PCI-LAD: 2.25 mm x 16 mm Express BMS; Ramus Intermedius- 2.75 mm x 16 mm Cypher DES (2.8 mm)  . Hypothyroidism     Synthroid  . History of DVT of lower extremity      postoperative  . DJD (degenerative joint disease), lumbosacral      status post L4-L5 surgery    Prior Cardiac Evaluation and Past Surgical History: Past Surgical History  Procedure Laterality Date  . Brain surgery  2012    Removal of pituitary adenoma  . Spine surgery      L4-L5  . Coronary angioplasty with stent placement  February 2004    PCI-dLAD: 2.25 mm x 16 mm BMS;;PCI- RI: 2.75 mm 18 mm Cypher DES  . Cardiac catheterization  November 2008    Significant LAD tapering but no significant disease the distal stent. Patent  Ramus stent. 90% lesion in small OM 2; small nondominant RCA. Medical therapy. EF 50-60%.  . Nm myoview ltd  January 2012    Exercise ~9 min - 10 METS; no ischemia or infarction    Allergies  Allergen Reactions  . Other     Blood pressure med name unknown.    Current Outpatient Prescriptions  Medication Sig Dispense Refill  . amLODipine (NORVASC) 10 MG tablet TAKE 1 TABLET (10 MG TOTAL) BY MOUTH DAILY.  90 tablet  1  . aspirin 81 MG tablet Take 81 mg by mouth daily.      . hydrocortisone (CORTEF) 10 MG tablet Take 10 mg by mouth daily. In afternoon      . hydrocortisone (CORTEF) 20 MG tablet Take 20 mg by mouth every morning.       Marland Kitchen levothyroxine (SYNTHROID, LEVOTHROID) 100 MCG tablet Take 100 mcg by mouth daily before breakfast.      . lisinopril (PRINIVIL,ZESTRIL) 20 MG tablet TAKE 1 TABLET (20 MG TOTAL) BY MOUTH DAILY.  90 tablet  0  . metFORMIN (GLUCOPHAGE) 500 MG tablet Take 2 tablets with evening meal daily  60 tablet  5  . OVER THE COUNTER MEDICATION Vitamin D3 2000 units take in the evening      . simvastatin (ZOCOR) 40 MG tablet Take 20 mg by  mouth every evening.       . Testosterone (ANDROGEL) 20.25 MG/1.25GM (1.62%) GEL Place 2 application onto the skin daily. (2 pumps each arm)      . testosterone cypionate (DEPO-TESTOSTERONE) 200 MG/ML injection Inject 1.5 mLs (300 mg total) into the muscle every 21 ( twenty-one) days.  10 mL  3  . hydrochlorothiazide (HYDRODIURIL) 25 MG tablet Take 1 tablet (25 mg total) by mouth daily.  30 tablet  12   No current facility-administered medications for this visit.    History   Social History Narrative   He is a married father of 2, grandfather 72. Exercises only occasionally. Not as much as he desires 2. 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   He does not smoke cigarettes -- quit in 1995. Does not drink alcohol.   ROS: A comprehensive Review of Systems - Negative except Musculoskeletal/osteoporosis pains noted above.  PHYSICAL EXAM BP  150/90  Pulse 69  Ht 5' 9.5" (1.765 m)  Wt 244 lb 12.8 oz (111.041 kg)  BMI 35.64 kg/m2 General appearance: alert, cooperative, appears stated age, no distress, mildly obese and moderately obese Neck: no adenopathy, no carotid bruit, no JVD, supple, symmetrical, trachea midline and thyroid not enlarged, symmetric, no tenderness/mass/nodules Lungs: clear to auscultation bilaterally, normal percussion bilaterally and Nonlabored, good air movement Heart: regular rate and rhythm, S1, S2 normal, no murmur, click, rub or gallop and normal apical impulse Abdomen: soft, non-tender; bowel sounds normal; no masses,  no organomegaly Extremities: extremities normal, atraumatic, no cyanosis or edema Pulses: 2+ and symmetric Neurologic: Alert and oriented X 3, normal strength and tone. Normal symmetric reflexes. Normal coordination and gait  HWE:[id number] today: Yes Rate: 69 , Rhythm: NSR; RSR prime pattern otherwise normal;  Recent Labs: From October 2014  TC 183, HDL 49 LDL 103, TG 147. --This actually was a worse reading then in January of 2014 with TC 154 LDL 86 NTG 95. HDL is the same --> due for labs by PCP at the end of the month.  ASSESSMENT / PLAN: CAD S/P percutaneous coronary angioplasty Stable with no symptoms. On aspirin, statin, and ACE inhibitor. He is on dihydropyridine calcium channel blocker but not a beta blocker. Presumably secondary to bradycardia in the past.  Hyperlipidemia LDL goal <70 monitored by PCP. On statin..  Essential hypertension Not adequately controlled. Will add HCTZ 25 mg daily. If that is not sufficient, I would just consider Bystolic versus carvedilol to avoid worsening bradycardia.  Obesity (BMI 30-39.9) He knows he gained more weight than he usually has had. He needs to work on dietary modification and increased exercise level. Goal weight loss would be 24 pounds in a year which would probably get him out of the obesity criteria.    Orders Placed This  Encounter  Procedures  . EKG 12-Lead   Meds ordered this encounter  Medications  . hydrochlorothiazide (HYDRODIURIL) 25 MG tablet    Sig: Take 1 tablet (25 mg total) by mouth daily.    Dispense:  30 tablet    Refill:  12   Followup: One year  Koltyn Kelsay W. Ellyn Hack, M.D., M.S. [facility] [address] [place],   [id number]  [phone] Pager # [phone]

## 2013-06-26 NOTE — Assessment & Plan Note (Signed)
monitored by PCP. On statin.Marland Kitchen

## 2013-06-26 NOTE — Assessment & Plan Note (Signed)
Not adequately controlled. Will add HCTZ 25 mg daily. If that is not sufficient, I would just consider Bystolic versus carvedilol to avoid worsening bradycardia.

## 2013-06-26 NOTE — Assessment & Plan Note (Deleted)
Not adequately controlled. Will add HCTZ 25 mg daily. If that is not sufficient, I would just consider Bystolic versus carvedilol to avoid worsening bradycardia. 

## 2013-06-28 ENCOUNTER — Encounter: Payer: Self-pay | Admitting: Cardiology

## 2013-06-29 ENCOUNTER — Telehealth: Payer: Self-pay | Admitting: Cardiology

## 2013-06-29 NOTE — Telephone Encounter (Signed)
Spoke to patient. Informed him to stopped taking hctz. Pt verbalized understanding.

## 2013-06-29 NOTE — Telephone Encounter (Signed)
Spoke to patient He states he has been having cramps everyday. He does not weigh himself at all.  Patient states he is taking medication with the lisinopril.  Will defer to Dr Ellyn Hack and contact patient back. Carl Gutierrez verbalized understanding.

## 2013-06-29 NOTE — Telephone Encounter (Signed)
Please call-was here on Friday. He started taking Hydrochlorothizide and is having cramps.He said he was suppose to call here if he had this problem.

## 2013-06-29 NOTE — Telephone Encounter (Signed)
Stop HCTZ - I suspect K is low.  Tell him to eat bananas.  I will review meds & see if we cn use something else.  Leonie Man, MD

## 2013-07-04 ENCOUNTER — Ambulatory Visit: Payer: Self-pay | Admitting: Internal Medicine

## 2013-07-04 VITALS — BP 130/86 | HR 68 | Temp 98.1°F | Resp 18 | Ht 70.56 in | Wt 239.0 lb

## 2013-07-04 DIAGNOSIS — Z0289 Encounter for other administrative examinations: Secondary | ICD-10-CM

## 2013-07-04 NOTE — Progress Notes (Signed)
U/A SpGr- 1.015  Protein- Neg Blood- Neg Glucose- Neg

## 2013-07-04 NOTE — Progress Notes (Signed)
   Subjective:    Patient ID: Carl Gutierrez, male    DOB: 11-20-1945, 68 y.o.   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 He has no complaints today. He has    Review of Systems     Objective:   Physical Exam        Assessment & Plan:

## 2013-07-04 NOTE — Progress Notes (Signed)
   Subjective:    Patient ID: Carl Gutierrez, male    DOB: 21-Feb-1946, 68 y.o.   MRN: [id number]  HPI Panhypopit is treated and controlled by endocrine. HTN controlled. All meds tolerated   Review of Systems  Constitutional: Negative.   HENT: Negative.   Eyes: Negative.   Respiratory: Negative.   Cardiovascular: Negative.   Gastrointestinal: Negative.   Endocrine: Negative.   Genitourinary: Negative.   Musculoskeletal: Negative.   Skin: Negative.   Allergic/Immunologic: Negative.   Neurological: Negative.   Hematological: Negative.   Psychiatric/Behavioral: Negative.        Objective:   Physical Exam  Constitutional: He is oriented to person, place, and time. He appears well-developed and well-nourished.  HENT:  Head: Normocephalic.  Right Ear: External ear normal.  Left Ear: External ear normal.  Mouth/Throat: Oropharynx is clear and moist.  Eyes: Conjunctivae are normal. Pupils are equal, round, and reactive to light.  Neck: Normal range of motion. Neck supple. No tracheal deviation present. No thyromegaly present.  Cardiovascular: Normal rate, regular rhythm and normal heart sounds.   Pulmonary/Chest: Effort normal and breath sounds normal.  Abdominal: Soft. Bowel sounds are normal.  Genitourinary: Penis normal.  Musculoskeletal: Normal range of motion.  Lymphadenopathy:    He has no cervical adenopathy.  Neurological: He is alert and oriented to person, place, and time. No cranial nerve deficit. He exhibits normal muscle tone. Coordination normal.  Psychiatric: He has a normal mood and affect. His behavior is normal. Judgment and thought content normal.          Assessment & Plan:  DOT 1 year

## 2013-08-21 ENCOUNTER — Other Ambulatory Visit: Payer: Self-pay | Admitting: Internal Medicine

## 2013-08-30 ENCOUNTER — Other Ambulatory Visit: Payer: Self-pay | Admitting: *Deleted

## 2013-08-30 MED ORDER — METFORMIN HCL 500 MG PO TABS: ORAL_TABLET | ORAL | Status: DC

## 2013-09-19 ENCOUNTER — Other Ambulatory Visit: Payer: Self-pay | Admitting: Internal Medicine

## 2013-09-30 ENCOUNTER — Other Ambulatory Visit: Payer: Self-pay | Admitting: *Deleted

## 2013-09-30 ENCOUNTER — Telehealth: Payer: Self-pay | Admitting: Endocrinology

## 2013-09-30 NOTE — Telephone Encounter (Signed)
Patients wife called and stated that her husband needs a refill Metformin The pharmacy faxed over [place] 3 times and no response   [facility]   Call back: [phone]  Thank You

## 2013-09-30 NOTE — Telephone Encounter (Signed)
Patient was due for an appt in February, last refill was last month and note sent to pharmacy stating no refills until  patient is seen.

## 2013-10-03 ENCOUNTER — Telehealth: Payer: Self-pay | Admitting: *Deleted

## 2013-10-03 ENCOUNTER — Other Ambulatory Visit: Payer: Self-pay | Admitting: *Deleted

## 2013-10-03 ENCOUNTER — Ambulatory Visit (INDEPENDENT_AMBULATORY_CARE_PROVIDER_SITE_OTHER): Payer: Medicare Other | Admitting: Endocrinology

## 2013-10-03 ENCOUNTER — Encounter: Payer: Self-pay | Admitting: Endocrinology

## 2013-10-03 ENCOUNTER — Ambulatory Visit (INDEPENDENT_AMBULATORY_CARE_PROVIDER_SITE_OTHER): Payer: Medicare Other | Admitting: Internal Medicine

## 2013-10-03 VITALS — BP 144/76 | HR 83 | Temp 99.1°F | Resp 16 | Ht 69.5 in | Wt 237.0 lb

## 2013-10-03 VITALS — BP 110/78 | HR 66 | Temp 98.1°F | Resp 16 | Ht 67.0 in | Wt 238.0 lb

## 2013-10-03 DIAGNOSIS — E119 Type 2 diabetes mellitus without complications: Secondary | ICD-10-CM | POA: Diagnosis not present

## 2013-10-03 DIAGNOSIS — I2510 Atherosclerotic heart disease of native coronary artery without angina pectoris: Secondary | ICD-10-CM | POA: Diagnosis not present

## 2013-10-03 DIAGNOSIS — Z7189 Other specified counseling: Secondary | ICD-10-CM

## 2013-10-03 DIAGNOSIS — E291 Testicular hypofunction: Secondary | ICD-10-CM | POA: Diagnosis not present

## 2013-10-03 DIAGNOSIS — E230 Hypopituitarism: Secondary | ICD-10-CM | POA: Diagnosis not present

## 2013-10-03 DIAGNOSIS — I10 Essential (primary) hypertension: Secondary | ICD-10-CM

## 2013-10-03 DIAGNOSIS — Z9861 Coronary angioplasty status: Secondary | ICD-10-CM

## 2013-10-03 DIAGNOSIS — E785 Hyperlipidemia, unspecified: Secondary | ICD-10-CM | POA: Diagnosis not present

## 2013-10-03 LAB — LIPID PANEL
Cholesterol: 150 mg/dL (ref 0–200)
HDL: 46.9 mg/dL (ref >39.00)
LDL Cholesterol: 77 mg/dL (ref 0–99)
Total CHOL/HDL Ratio: 3
Triglycerides: 133 mg/dL (ref 0.0–149.0)
VLDL: 26.6 mg/dL (ref 0.0–40.0)

## 2013-10-03 LAB — COMPREHENSIVE METABOLIC PANEL
ALT: 22 U/L (ref 0–53)
AST: 20 U/L (ref 0–37)
Albumin: 3.9 g/dL (ref 3.5–5.2)
Alkaline Phosphatase: 61 U/L (ref 39–117)
BUN: 15 mg/dL (ref 6–23)
CO2: 23 mEq/L (ref 19–32)
Calcium: 9.1 mg/dL (ref 8.4–10.5)
Chloride: 107 mEq/L (ref 96–112)
Creatinine, Ser: 1.2 mg/dL (ref 0.4–1.5)
GFR: 78.52 mL/min (ref >60.00)
Glucose, Bld: 102 mg/dL — ABNORMAL HIGH (ref 70–99)
Potassium: 4 mEq/L (ref 3.5–5.1)
Sodium: 140 mEq/L (ref 135–145)
Total Bilirubin: 0.5 mg/dL (ref 0.3–1.2)
Total Protein: 7.1 g/dL (ref 6.0–8.3)

## 2013-10-03 LAB — T4, FREE: Free T4: 1.09 ng/dL (ref 0.60–1.60)

## 2013-10-03 LAB — HEMOGLOBIN A1C: Hgb A1c MFr Bld: 6.6 % — ABNORMAL HIGH (ref 4.6–6.5)

## 2013-10-03 MED ORDER — METFORMIN HCL 500 MG PO TABS: ORAL_TABLET | ORAL | Status: DC

## 2013-10-03 MED ORDER — TESTOSTERONE CYPIONATE 200 MG/ML IM SOLN
300.0000 mg | Freq: Once | INTRAMUSCULAR | Status: AC
  Administered 2013-10-03: 300 mg via INTRAMUSCULAR

## 2013-10-03 MED ORDER — AMLODIPINE BESYLATE 10 MG PO TABS: 10.0000 mg | ORAL_TABLET | Freq: Every day | ORAL | Status: DC

## 2013-10-03 NOTE — Telephone Encounter (Signed)
Please send

## 2013-10-03 NOTE — Progress Notes (Signed)
Carl Gutierrez is a 68 y.o. male.    Chief complaint: Followup  History of Present Illness:  HYPOPITUITARISM: This is secondary to his pituitary tumor which was treated surgically in 07/2010 He has usually been asymptomatic with his multiple hormone deficiencies except at the time of diagnosis.  1. He has had marked hypogonadism at baseline levels of about 18. Initially had decreased libido, some fatigue and also erectile dysfunction  Again difficult to assess adequacy of his testosterone supplementation since he usually denies any symptoms even when his testosterone levels are very low. His previous testosterone levels have ranged from 64-252.  He had been variably compliant with his testosterone supplements. Now he does admit that he is irregular with the testosterone application if he does not get time to do this in the morning which is frequent. He was recommended testosterone injections but he wanted to use up his gel and is now here for injection. Does not want to self inject  2. Secondary hypothyroidism: Does not complain of any fatigue, has been regular with his levothyroxine supplements of 100 mcg. His last free T4 was normal 3. Cortisone deficiency: He is taking his supplement quite regularly, 20 mg midmorning and 10 mg in the early evening, no unusual fatigue, orthostatic lightheadedness or nausea 4. Because of his lack of symptoms growth hormone levels have not been evaluated. Also has not had any diabetes insipidus  DIABETES: He has had mild diabetes for the last few years and treated by his primary care physician with low dose metformin only He was seen by the dietitian in 6/14 and was instructed on cut back on high fat foods, balanced breakfast He is not doing any regular exercise but does some yard work or some walking, limited by knee joint pains Also he has not lost any weight His home blood sugars are not checked recently No recent A1c available  Wt Readings from Last 3  Encounters:  10/03/13 238 lb (107.956 kg)  07/04/13 239 lb (108.41 kg)  06/24/13 244 lb 12.8 oz (111.041 kg)    Lab Results  Component Value Date   HGBA1C 5.9 01/17/2013   HGBA1C 6.5 07/12/2012   HGBA1C 6.0 11/17/2011   Lab Results  Component Value Date   MICROALBUR 0.9 03/25/2013   Elliott 105* 03/25/2013   CREATININE 1.2 03/25/2013       Medication List       This list is accurate as of: 10/03/13 10:15 AM.  Always use your most recent med list.               amLODipine 10 MG tablet  Commonly known as:  NORVASC  TAKE 1 TABLET (10 MG TOTAL) BY MOUTH DAILY. PATIENT NEEDS OFFICE VISIT FOR ADDITIONAL REFILLS     ANDROGEL 20.25 MG/1.25GM (1.62%) Gel  Generic drug:  Testosterone  Place 2 application onto the skin daily. (2 pumps each arm)     aspirin 81 MG tablet  Take 81 mg by mouth daily.     hydrochlorothiazide 25 MG tablet  Commonly known as:  HYDRODIURIL  Take 1 tablet (25 mg total) by mouth daily.     hydrocortisone 20 MG tablet  Commonly known as:  CORTEF  Take 20 mg by mouth every morning.     hydrocortisone 10 MG tablet  Commonly known as:  CORTEF  Take 10 mg by mouth daily. In afternoon     levothyroxine 100 MCG tablet  Commonly known as:  SYNTHROID, LEVOTHROID  Take 100 mcg  by mouth daily before breakfast.     lisinopril 20 MG tablet  Commonly known as:  PRINIVIL,ZESTRIL  TAKE 1 TABLET (20 MG TOTAL) BY MOUTH DAILY.     metFORMIN 500 MG tablet  Commonly known as:  GLUCOPHAGE  Take 2 tablets with evening meal daily     OVER THE COUNTER MEDICATION  Vitamin D3 2000 units take in the evening     simvastatin 40 MG tablet  Commonly known as:  ZOCOR  Take 20 mg by mouth every evening.     testosterone cypionate 200 MG/ML injection  Commonly known as:  DEPO-TESTOSTERONE  Inject 1.5 mLs (300 mg total) into the muscle every 21 ( twenty-one) days.     Vitamin D3 2000 UNITS Tabs  Take by mouth.        Allergies:  Allergies  Allergen Reactions  .  Other     Blood pressure med name unknown.    Past Medical History  Diagnosis Date  . Panhypopituitary dwarfism     Status post pituitary adenoma removal in 2012  . Hyperlipidemia   . Hypertension   . Diabetes mellitus     Low-dose metformin  . CAD S/P percutaneous coronary angioplasty February 2004    PCI-LAD: 2.25 mm x 16 mm Express BMS; Ramus Intermedius- 2.75 mm x 16 mm Cypher DES (2.8 mm)  . Hypothyroidism     Synthroid  . History of DVT of lower extremity      postoperative  . DJD (degenerative joint disease), lumbosacral      status post L4-L5 surgery    Past Surgical History  Procedure Laterality Date  . Brain surgery  2012    Removal of pituitary adenoma  . Spine surgery      L4-L5  . Coronary angioplasty with stent placement  February 2004    PCI-dLAD: 2.25 mm x 16 mm BMS;;PCI- RI: 2.75 mm 18 mm Cypher DES  . Cardiac catheterization  November 2008    Significant LAD tapering but no significant disease the distal stent. Patent Ramus stent. 90% lesion in small OM 2; small nondominant RCA. Medical therapy. EF 50-60%.  . Nm myoview ltd  January 2012    Exercise ~9 min - 10 METS; no ischemia or infarction    No family history on file.  Social History:  reports that he quit smoking about 20 years ago. He has never used smokeless tobacco. He reports that he does not drink alcohol or use illicit drugs.  Review of Systems   History of erectile dysfunction Has previous history of knee surgery  Last steroid in his knees was about 6 weeks ago, probably getting Hyalgan injections now  EXAM:  BP 110/78  Pulse 66  Temp(Src) 98.1 F (36.7 C)  Resp 16  Ht 5\' 7"  (1.702 m)  Wt 238 lb (107.956 kg)  BMI 37.27 kg/m2  SpO2 96%   Assessment/Plan:   1. Panhypopituitarism: He has  been asymptomatic with his multiple hormone deficiencies except at the time of diagnosis. He has consistent difficulties with compliance with his AndroGel and will start him on 300 mg every 3  weeks of Depo testosterone. Discussed that the dose will be adjust based on followup levels in 6 weeks. Also will check his free T4 again to assess his dose of levothyroxine. He will continue his hydrocortisone supplement indefinitely also  2. Hyperlipidemia: To have lipids checked in followup. Currently only on 20 mg simvastatin 3. Diabetes: His A1c needs to be assessed. He has  not checked his readings at home. Had limited exercise ability because of his knee joint pains. Advised him to work harder on weight loss and increase exercise as tolerated. Also need to check periodic postprandial readings at home. 4. Hypertension: Blood pressure is controlled and encouraged him to check it more regularly at home. Also followed by PCP   Elayne Snare 10/03/2013, 10:15 AM

## 2013-10-03 NOTE — Telephone Encounter (Signed)
Rx sent 

## 2013-10-03 NOTE — Patient Instructions (Signed)

## 2013-10-03 NOTE — Progress Notes (Signed)
   Subjective:    Patient ID: Carl Gutierrez, male    DOB: 07-04-1945, 68 y.o.   MRN: [id number]  HPI pt here for a refill of amlodipine 10mg . Pt is going out of town and needs blood pressure medication. Blood pressure has been running good this morning was 110/78. Denies headache, or blurry vision.     Review of Systems     Objective:   Physical Exam  Constitutional: He is oriented to person, place, and time. He appears well-developed and well-nourished.  HENT:  Head: Normocephalic.  Eyes: EOM are normal.  Neck: Normal range of motion.  Cardiovascular: Normal rate.   Pulmonary/Chest: Effort normal.  Neurological: He is alert and oriented to person, place, and time. He exhibits normal muscle tone. Coordination normal.  Psychiatric: He has a normal mood and affect.          Assessment & Plan:  HTN RF amlodipine

## 2013-10-08 DIAGNOSIS — J069 Acute upper respiratory infection, unspecified: Secondary | ICD-10-CM | POA: Diagnosis not present

## 2013-10-08 DIAGNOSIS — J00 Acute nasopharyngitis [common cold]: Secondary | ICD-10-CM | POA: Diagnosis not present

## 2013-10-24 ENCOUNTER — Other Ambulatory Visit: Payer: Self-pay | Admitting: *Deleted

## 2013-10-24 ENCOUNTER — Ambulatory Visit: Payer: Medicare Other | Admitting: Endocrinology

## 2013-10-24 DIAGNOSIS — E291 Testicular hypofunction: Secondary | ICD-10-CM

## 2013-10-24 MED ORDER — TESTOSTERONE CYPIONATE 200 MG/ML IM SOLN
300.0000 mg | Freq: Once | INTRAMUSCULAR | Status: AC
  Administered 2013-10-24: 300 mg via INTRAMUSCULAR

## 2013-11-10 ENCOUNTER — Other Ambulatory Visit (INDEPENDENT_AMBULATORY_CARE_PROVIDER_SITE_OTHER): Payer: Medicare Other

## 2013-11-10 ENCOUNTER — Other Ambulatory Visit: Payer: Self-pay | Admitting: *Deleted

## 2013-11-10 DIAGNOSIS — E291 Testicular hypofunction: Secondary | ICD-10-CM

## 2013-11-10 DIAGNOSIS — E785 Hyperlipidemia, unspecified: Secondary | ICD-10-CM

## 2013-11-10 DIAGNOSIS — E349 Endocrine disorder, unspecified: Secondary | ICD-10-CM

## 2013-11-10 LAB — COMPREHENSIVE METABOLIC PANEL
ALT: 18 U/L (ref 0–53)
AST: 15 U/L (ref 0–37)
Albumin: 3.9 g/dL (ref 3.5–5.2)
Alkaline Phosphatase: 65 U/L (ref 39–117)
BUN: 17 mg/dL (ref 6–23)
CO2: 24 mEq/L (ref 19–32)
Calcium: 8.7 mg/dL (ref 8.4–10.5)
Chloride: 108 mEq/L (ref 96–112)
Creatinine, Ser: 1.2 mg/dL (ref 0.4–1.5)
GFR: 74.83 mL/min (ref >60.00)
Glucose, Bld: 101 mg/dL — ABNORMAL HIGH (ref 70–99)
Potassium: 4.2 mEq/L (ref 3.5–5.1)
Sodium: 139 mEq/L (ref 135–145)
Total Bilirubin: 0.4 mg/dL (ref 0.2–1.2)
Total Protein: 6.9 g/dL (ref 6.0–8.3)

## 2013-11-10 LAB — TESTOSTERONE: Testosterone: 262.15 ng/dL — ABNORMAL LOW (ref 300.00–890.00)

## 2013-11-10 LAB — LIPID PANEL
Cholesterol: 136 mg/dL (ref 0–200)
HDL: 41.2 mg/dL (ref >39.00)
LDL Cholesterol: 80 mg/dL (ref 0–99)
Total CHOL/HDL Ratio: 3
Triglycerides: 74 mg/dL (ref 0.0–149.0)
VLDL: 14.8 mg/dL (ref 0.0–40.0)

## 2013-11-10 LAB — HEMOGLOBIN A1C: Hgb A1c MFr Bld: 6.4 % (ref 4.6–6.5)

## 2013-11-14 ENCOUNTER — Encounter: Payer: Self-pay | Admitting: Endocrinology

## 2013-11-14 ENCOUNTER — Ambulatory Visit (INDEPENDENT_AMBULATORY_CARE_PROVIDER_SITE_OTHER): Payer: Medicare Other | Admitting: Endocrinology

## 2013-11-14 VITALS — BP 132/80 | HR 70 | Temp 98.5°F | Resp 16 | Ht 67.0 in | Wt 239.4 lb

## 2013-11-14 DIAGNOSIS — E119 Type 2 diabetes mellitus without complications: Secondary | ICD-10-CM

## 2013-11-14 DIAGNOSIS — I2510 Atherosclerotic heart disease of native coronary artery without angina pectoris: Secondary | ICD-10-CM | POA: Diagnosis not present

## 2013-11-14 DIAGNOSIS — E230 Hypopituitarism: Secondary | ICD-10-CM

## 2013-11-14 DIAGNOSIS — Z9861 Coronary angioplasty status: Secondary | ICD-10-CM | POA: Diagnosis not present

## 2013-11-14 NOTE — Progress Notes (Signed)
Carl Gutierrez is a 68 y.o. male.    Chief complaint: Followup of hypogonadism  History of Present Illness:  HYPOPITUITARISM: This is secondary to his pituitary tumor which was treated surgically in 07/2010 He has usually been asymptomatic with his multiple hormone deficiencies except at the time of diagnosis.  History of hypogonadism at baseline levels of about 18. Initially had decreased libido, some fatigue and also erectile dysfunction  Again difficult to assess adequacy of his testosterone supplementation since he usually denies any symptoms even when his testosterone levels are very low. His previous testosterone levels have ranged from 64-252.   He had been variably compliant with his testosterone supplements.  Because of noncompliance with his transdermal therapy he was started on injections last month. He got his first injection on 10/03/13 with 300 mg and this was repeated on 10/24/13 Now he does does not feel any different and does not complain of any fatigue or libido changes Testosterone level is improved about 19 days after his second injection but not to normal yet No side effects or intolerance of the injection     Medication List       This list is accurate as of: 11/14/13  3:49 PM.  Always use your most recent med list.               amLODipine 10 MG tablet  Commonly known as:  NORVASC  Take 1 tablet (10 mg total) by mouth daily.     ANDROGEL 20.25 MG/1.25GM (1.62%) Gel  Generic drug:  Testosterone  Place 2 application onto the skin daily. (2 pumps each arm)     aspirin 81 MG tablet  Take 81 mg by mouth daily.     hydrochlorothiazide 25 MG tablet  Commonly known as:  HYDRODIURIL  Take 1 tablet (25 mg total) by mouth daily.     hydrocortisone 20 MG tablet  Commonly known as:  CORTEF  Take 20 mg by mouth every morning.     hydrocortisone 10 MG tablet  Commonly known as:  CORTEF  Take 10 mg by mouth daily. In afternoon     levothyroxine 100 MCG tablet   Commonly known as:  SYNTHROID, LEVOTHROID  Take 100 mcg by mouth daily before breakfast.     lisinopril 20 MG tablet  Commonly known as:  PRINIVIL,ZESTRIL  TAKE 1 TABLET (20 MG TOTAL) BY MOUTH DAILY.     metFORMIN 500 MG tablet  Commonly known as:  GLUCOPHAGE  Take 2 tablets with evening meal daily     OVER THE COUNTER MEDICATION  Vitamin D3 2000 units take in the evening     simvastatin 40 MG tablet  Commonly known as:  ZOCOR  Take 20 mg by mouth every evening.     testosterone cypionate 200 MG/ML injection  Commonly known as:  DEPO-TESTOSTERONE  Inject 1.5 mLs (300 mg total) into the muscle every 21 ( twenty-one) days.     Vitamin D3 2000 UNITS Tabs  Take by mouth.        Allergies:  Allergies  Allergen Reactions  . Other     Blood pressure med name unknown.    Past Medical History  Diagnosis Date  . Panhypopituitary dwarfism     Status post pituitary adenoma removal in 2012  . Hyperlipidemia   . Hypertension   . Diabetes mellitus     Low-dose metformin  . CAD S/P percutaneous coronary angioplasty February 2004    PCI-LAD: 2.25 mm x 16 mm  Express BMS; Ramus Intermedius- 2.75 mm x 16 mm Cypher DES (2.8 mm)  . Hypothyroidism     Synthroid  . History of DVT of lower extremity      postoperative  . DJD (degenerative joint disease), lumbosacral      status post L4-L5 surgery    Past Surgical History  Procedure Laterality Date  . Brain surgery  2012    Removal of pituitary adenoma  . Spine surgery      L4-L5  . Coronary angioplasty with stent placement  February 2004    PCI-dLAD: 2.25 mm x 16 mm BMS;;PCI- RI: 2.75 mm 18 mm Cypher DES  . Cardiac catheterization  November 2008    Significant LAD tapering but no significant disease the distal stent. Patent Ramus stent. 90% lesion in small OM 2; small nondominant RCA. Medical therapy. EF 50-60%.  . Nm myoview ltd  January 2012    Exercise ~9 min - 10 METS; no ischemia or infarction    No family history on  file.  Social History:  reports that he quit smoking about 20 years ago. He has never used smokeless tobacco. He reports that he does not drink alcohol or use illicit drugs.  Review of Systems   History of erectile dysfunction Has previous history of knee surgery  Diabetes: DIABETES: He has had mild diabetes for the last few years and treated by his primary care physician with low dose metformin only  He was seen by the dietitian in 6/14 and was instructed on cut back on high fat foods, balanced breakfast   He has not been able to lose weight, has difficulty walking because of knee pain  Lab Results  Component Value Date   HGBA1C 6.4 11/10/2013     EXAM:  BP 132/80  Pulse 70  Temp(Src) 98.5 F (36.9 C)  Resp 16  Ht 5\' 7"  (1.702 m)  Wt 239 lb 6.4 oz (108.591 kg)  BMI 37.49 kg/m2  SpO2 94%   Assessment/Plan:   1. Panhypopituitarism: He has  been asymptomatic with his hypogonadism despite very low levels. He has been compliant with his injection and will continue this. Most likely his testosterone level should plateau in about 2-3 months. Discussed genital benefits of testosterone supplementation along with all of the other hormone supplements 2. Diabetes: His A1c is fairly good. He has fairly good blood sugars at home also Had limited exercise ability because of his knee joint pains. He needs to do more readings after meals and bring his monitor for download   Elayne Snare 11/14/2013, 3:49 PM

## 2013-11-14 NOTE — Patient Instructions (Addendum)
Chair exercises   Please check blood sugars at least half the time about 2 hours after any meal and as directed on waking up. Please bring blood sugar monitor to each visit

## 2013-11-23 ENCOUNTER — Ambulatory Visit: Payer: Medicare Other | Admitting: Endocrinology

## 2013-11-23 DIAGNOSIS — E291 Testicular hypofunction: Secondary | ICD-10-CM

## 2013-11-23 MED ORDER — TESTOSTERONE CYPIONATE 200 MG/ML IM SOLN
300.0000 mg | Freq: Once | INTRAMUSCULAR | Status: AC
  Administered 2013-11-23: 300 mg via INTRAMUSCULAR

## 2013-11-29 ENCOUNTER — Telehealth: Payer: Self-pay

## 2013-12-05 ENCOUNTER — Ambulatory Visit: Payer: Medicare Other | Admitting: Family Medicine

## 2013-12-23 ENCOUNTER — Ambulatory Visit (INDEPENDENT_AMBULATORY_CARE_PROVIDER_SITE_OTHER): Payer: Medicare Other | Admitting: Emergency Medicine

## 2013-12-23 VITALS — BP 146/80 | HR 66 | Temp 98.5°F | Resp 18 | Ht 71.0 in | Wt 238.0 lb

## 2013-12-23 DIAGNOSIS — D179 Benign lipomatous neoplasm, unspecified: Secondary | ICD-10-CM

## 2013-12-23 DIAGNOSIS — R109 Unspecified abdominal pain: Secondary | ICD-10-CM

## 2013-12-23 DIAGNOSIS — I2510 Atherosclerotic heart disease of native coronary artery without angina pectoris: Secondary | ICD-10-CM

## 2013-12-23 DIAGNOSIS — Z9861 Coronary angioplasty status: Secondary | ICD-10-CM | POA: Diagnosis not present

## 2013-12-23 DIAGNOSIS — N200 Calculus of kidney: Secondary | ICD-10-CM

## 2013-12-23 LAB — POCT URINALYSIS DIPSTICK
Bilirubin, UA: NEGATIVE
Glucose, UA: NEGATIVE
Ketones, UA: NEGATIVE
Leukocytes, UA: NEGATIVE
Nitrite, UA: NEGATIVE
Protein, UA: NEGATIVE
Spec Grav, UA: 1.025
Urobilinogen, UA: 0.2
pH, UA: 5

## 2013-12-23 LAB — POCT UA - MICROSCOPIC ONLY
Bacteria, U Microscopic: NEGATIVE
Casts, Ur, LPF, POC: NEGATIVE
Crystals, Ur, HPF, POC: NEGATIVE
Epithelial cells, urine per micros: NEGATIVE
Mucus, UA: NEGATIVE
RBC, urine, microscopic: NEGATIVE
WBC, Ur, HPF, POC: NEGATIVE
Yeast, UA: NEGATIVE

## 2013-12-23 NOTE — Progress Notes (Signed)
[facility] [address], [place] [address] [phone]  Date:  12/23/2013   Name:  Carl Gutierrez   DOB:  [birth year]   MRN:  [id number]  PCP:  Kennon Portela, MD    Chief Complaint: Leg Swelling and Flank Pain   History of Present Illness:  Carl Gutierrez is a 68 y.o. very pleasant male patient who presents with the following:  Has a mass on his left shin that waxes and wanes.  Is tender.  No history of injury or travel.  No history of DVT.  Drives a truck locally.  No improvement with over the counter medications or other home remedies. Denies other complaint or health concern today.  Has an intermittent "catch" in his right side when he rolls out of bed in the morning, sometimes when he climbs down out of his truck cab.  No history of injury.  No fever or chills, nausea or vomiting, jaundice.  No stool change.  No dysuria, urgency or frequency.  No hematuria.  History of kidney stones.  No improvement with over the counter medications or other home remedies. Denies other complaint or health concern today.   Patient Active Problem List   Diagnosis Date Noted  . Obesity (BMI 30-39.9) 06/26/2013  . CAD S/P percutaneous coronary angioplasty 06/26/2013  . Essential hypertension 06/26/2013  . Panhypopituitarism 11/17/2011  .   11/17/2011  . Diabetes mellitus 11/17/2011  . Hyperlipidemia LDL goal <70 11/17/2011    Past Medical History  Diagnosis Date  . Panhypopituitary dwarfism     Status post pituitary adenoma removal in 2012  . Hyperlipidemia   . Hypertension   . Diabetes mellitus     Low-dose metformin  . CAD S/P percutaneous coronary angioplasty February 2004    PCI-LAD: 2.25 mm x 16 mm Express BMS; Ramus Intermedius- 2.75 mm x 16 mm Cypher DES (2.8 mm)  . Hypothyroidism     Synthroid  . History of DVT of lower extremity      postoperative  . DJD (degenerative joint disease), lumbosacral      status post L4-L5 surgery    Past Surgical  History  Procedure Laterality Date  . Brain surgery  2012    Removal of pituitary adenoma  . Spine surgery      L4-L5  . Coronary angioplasty with stent placement  February 2004    PCI-dLAD: 2.25 mm x 16 mm BMS;;PCI- RI: 2.75 mm 18 mm Cypher DES  . Cardiac catheterization  November 2008    Significant LAD tapering but no significant disease the distal stent. Patent Ramus stent. 90% lesion in small OM 2; small nondominant RCA. Medical therapy. EF 50-60%.  . Nm myoview ltd  January 2012    Exercise ~9 min - 10 METS; no ischemia or infarction    History  Substance Use Topics  . Smoking status: Former Smoker    Quit date: 06/24/1993  . Smokeless tobacco: Never Used  . Alcohol Use: No    History reviewed. No pertinent family history.  Allergies  Allergen Reactions  . Other     Blood pressure med name unknown.    Medication list has been reviewed and updated.  Current Outpatient Prescriptions on File Prior to Visit  Medication Sig Dispense Refill  . amLODipine (NORVASC) 10 MG tablet Take 1 tablet (10 mg total) by mouth daily.  90 tablet  3  . aspirin 81 MG tablet Take 81 mg by mouth daily.      Marland Kitchen  Cholecalciferol (VITAMIN D3) 2000 UNITS TABS Take by mouth.      . hydrochlorothiazide (HYDRODIURIL) 25 MG tablet Take 1 tablet (25 mg total) by mouth daily.  30 tablet  12  . hydrocortisone (CORTEF) 10 MG tablet Take 10 mg by mouth daily. In afternoon      . hydrocortisone (CORTEF) 20 MG tablet Take 20 mg by mouth every morning.       Marland Kitchen levothyroxine (SYNTHROID, LEVOTHROID) 100 MCG tablet Take 100 mcg by mouth daily before breakfast.      . lisinopril (PRINIVIL,ZESTRIL) 20 MG tablet TAKE 1 TABLET (20 MG TOTAL) BY MOUTH DAILY.  90 tablet  0  . metFORMIN (GLUCOPHAGE) 500 MG tablet Take 2 tablets with evening meal daily  60 tablet  3  . OVER THE COUNTER MEDICATION Vitamin D3 2000 units take in the evening      . simvastatin (ZOCOR) 40 MG tablet Take 20 mg by mouth every evening.       .  Testosterone (ANDROGEL) 20.25 MG/1.25GM (1.62%) GEL Place 2 application onto the skin daily. (2 pumps each arm)      . testosterone cypionate (DEPO-TESTOSTERONE) 200 MG/ML injection Inject 1.5 mLs (300 mg total) into the muscle every 21 ( twenty-one) days.  10 mL  3   No current facility-administered medications on file prior to visit.    Review of Systems:  As per HPI, otherwise negative.    Physical Examination: Filed Vitals:   12/23/13 1557  BP: 146/80  Pulse: 66  Temp: 98.5 F (36.9 C)  Resp: 18   Filed Vitals:   12/23/13 1557  Height: 5\' 11"  (1.803 m)  Weight: 238 lb (107.956 kg)   Body mass index is 33.21 kg/(m^2). Ideal Body Weight: Weight in (lb) to have BMI = 25: 178.9   GEN: WDWN, NAD, Non-toxic, Alert & Oriented x 3 HEENT: Atraumatic, Normocephalic.  Ears and Nose: No external deformity. EXTR: No clubbing/cyanosis/edema  Soft tissue mass left mid calf at border of tibia.  Mobile, not fixed to skin or underlying tissue.  No tenderness.   Smooth borders, soft. NEURO: Normal gait.  PSYCH: Normally interactive. Conversant. Not depressed or anxious appearing.  Calm demeanor.  ABDOMEN:  No tenderness, distension, masses or visceromegaly.  Back No tenderness    Assessment and Plan:  Lipoma leg Kidney stone  Signed,  Ellison Carwin, MD    Results for orders placed in visit on 12/23/13  POCT URINALYSIS DIPSTICK      Result Value Ref Range   Color, UA yellow     Clarity, UA clear     Glucose, UA neg     Bilirubin, UA neg     Ketones, UA neg     Spec Grav, UA 1.025     Blood, UA trace     pH, UA 5.0     Protein, UA neg     Urobilinogen, UA 0.2     Nitrite, UA neg     Leukocytes, UA Negative    POCT UA - MICROSCOPIC ONLY      Result Value Ref Range   WBC, Ur, HPF, POC neg     RBC, urine, microscopic neg     Bacteria, U Microscopic neg     Mucus, UA neg     Epithelial cells, urine per micros neg     Crystals, Ur, HPF, POC neg     Casts, Ur, LPF,  POC neg     Yeast, UA neg

## 2013-12-23 NOTE — Patient Instructions (Signed)
Ureteral Colic (Kidney Stones) °Ureteral colic is the result of a condition when kidney stones form inside the kidney. Once kidney stones are formed they may move into the tube that connects the kidney with the bladder (ureter). If this occurs, this condition may cause pain (colic) in the ureter.  °CAUSES  °Pain is caused by stone movement in the ureter and the obstruction caused by the stone. °SYMPTOMS  °The pain comes and goes as the ureter contracts around the stone. The pain is usually intense, sharp, and stabbing in character. The location of the pain may move as the stone moves through the ureter. When the stone is near the kidney the pain is usually located in the back and radiates to the belly (abdomen). When the stone is ready to pass into the bladder the pain is often located in the lower abdomen on the side the stone is located. At this location, the symptoms may mimic those of a urinary tract infection with urinary frequency. Once the stone is located here it often passes into the bladder and the pain disappears completely. °TREATMENT  °· Your caregiver will provide you with medicine for pain relief. °· You may require specialized follow-up X-rays. °· The absence of pain does not always mean that the stone has passed. It may have just stopped moving. If the urine remains completely obstructed, it can cause loss of kidney function or even complete destruction of the involved kidney. It is your responsibility and in your interest that X-rays and follow-ups as suggested by your caregiver are completed. Relief of pain without passage of the stone can be associated with severe damage to the kidney, including loss of kidney function on that side. °· If your stone does not pass on its own, additional measures may be taken by your caregiver to ensure its removal. °HOME CARE INSTRUCTIONS  °· Increase your fluid intake. Water is the preferred fluid since juices containing vitamin C may acidify the urine making it  less likely for certain stones (uric acid stones) to pass. °· Strain all urine. A strainer will be provided. Keep all particulate matter or stones for your caregiver to inspect. °· Take your pain medicine as directed. °· Make a follow-up appointment with your caregiver as directed. °· Remember that the goal is passage of your stone. The absence of pain does not mean the stone is gone. Follow your caregiver's instructions. °· Only take over-the-counter or prescription medicines for pain, discomfort, or fever as directed by your caregiver. °SEEK MEDICAL CARE IF:  °· Pain cannot be controlled with the prescribed medicine. °· You have a fever. °· Pain continues for longer than your caregiver advises it should. °· There is a change in the pain, and you develop chest discomfort or constant abdominal pain. °· You feel faint or pass out. °MAKE SURE YOU:  °· Understand these instructions. °· Will watch your condition. °· Will get help right away if you are not doing well or get worse. °Document Released: 03/12/2005 Document Revised: 09/27/2012 Document Reviewed: 11/27/2010 °Exit[employer]® Patient Information ©2015 [employer]. This information is not intended to replace advice given to you by your health care provider. Make sure you discuss any questions you have with your health care provider. ° °

## 2014-01-02 ENCOUNTER — Encounter: Payer: Self-pay | Admitting: Family Medicine

## 2014-01-02 ENCOUNTER — Ambulatory Visit (INDEPENDENT_AMBULATORY_CARE_PROVIDER_SITE_OTHER): Payer: Medicare Other | Admitting: Family Medicine

## 2014-01-02 VITALS — BP 134/81 | HR 73 | Temp 98.6°F | Resp 16 | Ht 70.25 in | Wt 234.8 lb

## 2014-01-02 DIAGNOSIS — I10 Essential (primary) hypertension: Secondary | ICD-10-CM | POA: Diagnosis not present

## 2014-01-02 DIAGNOSIS — I2510 Atherosclerotic heart disease of native coronary artery without angina pectoris: Secondary | ICD-10-CM | POA: Diagnosis not present

## 2014-01-02 DIAGNOSIS — E119 Type 2 diabetes mellitus without complications: Secondary | ICD-10-CM

## 2014-01-02 DIAGNOSIS — Z7189 Other specified counseling: Secondary | ICD-10-CM

## 2014-01-02 DIAGNOSIS — Z9861 Coronary angioplasty status: Secondary | ICD-10-CM | POA: Diagnosis not present

## 2014-01-02 MED ORDER — METFORMIN HCL 500 MG PO TABS: ORAL_TABLET | ORAL | Status: DC

## 2014-01-02 MED ORDER — AMLODIPINE BESYLATE 10 MG PO TABS: 10.0000 mg | ORAL_TABLET | Freq: Every day | ORAL | Status: DC

## 2014-01-02 NOTE — Patient Instructions (Addendum)
Keep scheduled appointment with Dr. Dwyane Dee and for labs prior to that office visit.  Schedule a medical physical with me in next 3-6 months. (this needs to be separate from your DOT physical). Bring your blood pressure machine with you to next office visit to see if it is obtaining accurate readings. Keep a record of your blood pressures outside of the office and bring them to the next office visit. I would recommend a different cholesterol medication as there is some increased risk of muscle problems with simvastatin and amlodipine use. Other option would be lipitor or pravastatin.  Discuss this with your cardiologist, but let me know if you would like me to change this medicine.  Return to the clinic or go to the nearest emergency room if any of your symptoms worsen or new symptoms occur.

## 2014-01-02 NOTE — Progress Notes (Signed)
Subjective:  This chart was scribed for Carl Agreste, MD, by Neta Ehlers, ED Scribe. This  patient's care was started at 5:00 PM.    Patient ID: Carl Gutierrez, male    DOB: May 06, 1946, 68 y.o.   MRN: [id number]  HPI  Carl Gutierrez is a 68 y.o. male PCP: GUEST, Veneda Melter, MD  Carl Gutierrez is here for a refill of Norvasc and Metformin.   He is a prior pt of Dr. Elder Cyphers. Most recently seen here 10 days ago by Dr. Ouida Sills for a kidney stone. He has a h/o HTN and hypopituitarism for which he is followed by Dr. Dwyane Dee. Carl Gutierrez is also followed at the [facility] by an orthopedic; in the office, the pt voices pain to his knees, worse on left.   DM: He takes Metformin 1000 mg qd. His last hemoglobin A1C was on two months ago on May 28 at 6.4 at Dr. Ronnie Derby office. Dr. Dwyane Dee follows the pt's DM in addition to hypopituitarism; his next appointment with Dr. Dwyane Dee is in six weeks on September 3rd. Carl Gutierrez also endorses continual usage of Simvastatin, and he denies side effects with the medication. Re metformin, he denies diarrhea as a side effect.   HTN: Lipid panel one month ago, normal with LDL of 80 and total cholesterol 136. Last creatine was 1.2 on May 28th, which has been stable for the past year. In the office, Carl Gutierrez reports he measures his BP at home occasionally, and he reports readings of 150/90; in the office his BP is 134/81. The pt endorses taking 20 mg lisinopril, 10 mg Norvasc, and 81 mg Aspirin daily. He denies chest pain, SOB, lightheadedness, dizziness, or lower extremity swelling. He voices a h/o adverse side effects to Hydrochlorothiazide including muscle aches.   He denies using Androgel.   The pt had a DOT physical with Dr. Elder Cyphers seven months ago in January 2015. The pt's last medical physical was in January 2014.  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   Patient Active Problem List   Diagnosis Date Noted  . Obesity (BMI 30-39.9) 06/26/2013  . CAD S/P  percutaneous coronary angioplasty 06/26/2013  . Essential hypertension 06/26/2013  . Panhypopituitarism 11/17/2011  .   11/17/2011  . Diabetes mellitus 11/17/2011  . Hyperlipidemia LDL goal <70 11/17/2011   Past Medical History  Diagnosis Date  . Panhypopituitary dwarfism     Status post pituitary adenoma removal in 2012  . Hyperlipidemia   . Hypertension   . Diabetes mellitus     Low-dose metformin  . CAD S/P percutaneous coronary angioplasty February 2004    PCI-LAD: 2.25 mm x 16 mm Express BMS; Ramus Intermedius- 2.75 mm x 16 mm Cypher DES (2.8 mm)  . Hypothyroidism     Synthroid  . History of DVT of lower extremity      postoperative  . DJD (degenerative joint disease), lumbosacral      status post L4-L5 surgery   Past Surgical History  Procedure Laterality Date  . Brain surgery  2012    Removal of pituitary adenoma  . Spine surgery      L4-L5  . Coronary angioplasty with stent placement  February 2004    PCI-dLAD: 2.25 mm x 16 mm BMS;;PCI- RI: 2.75 mm 18 mm Cypher DES  . Cardiac catheterization  November 2008    Significant LAD tapering but no significant disease the distal stent. Patent Ramus stent. 90% lesion in small OM 2;  small nondominant RCA. Medical therapy. EF 50-60%.  . Nm myoview ltd  January 2012    Exercise ~9 min - 10 METS; no ischemia or infarction   Allergies  Allergen Reactions  . Hydrochlorothiazide     Leg and side pain with this  . Other     Blood pressure med name unknown.   Prior to Admission medications   Medication Sig Start Date End Date Taking? Authorizing Provider  amLODipine (NORVASC) 10 MG tablet Take 1 tablet (10 mg total) by mouth daily. 10/03/13  Yes Carl Flaming, MD  aspirin 81 MG tablet Take 81 mg by mouth daily.   Yes Historical Provider, MD  hydrocortisone (CORTEF) 10 MG tablet Take 10 mg by mouth daily. In afternoon   Yes Historical Provider, MD  hydrocortisone (CORTEF) 20 MG tablet Take 20 mg by mouth every morning.    Yes  Historical Provider, MD  levothyroxine (SYNTHROID, LEVOTHROID) 100 MCG tablet Take 100 mcg by mouth daily before breakfast.   Yes Historical Provider, MD  lisinopril (PRINIVIL,ZESTRIL) 20 MG tablet TAKE 1 TABLET (20 MG TOTAL) BY MOUTH DAILY. 04/20/13  Yes Carl Flaming, MD  metFORMIN (GLUCOPHAGE) 500 MG tablet Take 2 tablets with evening meal daily 10/03/13  Yes Elayne Snare, MD  OVER THE COUNTER MEDICATION Vitamin D3 2000 units take in the evening   Yes Historical Provider, MD  simvastatin (ZOCOR) 40 MG tablet Take 20 mg by mouth every evening.    Yes Historical Provider, MD  testosterone cypionate (DEPO-TESTOSTERONE) 200 MG/ML injection Inject 1.5 mLs (300 mg total) into the muscle every 21 ( twenty-one) days. 03/29/13  Yes Elayne Snare, MD  Cholecalciferol (VITAMIN D3) 2000 UNITS TABS Take by mouth.    Historical Provider, MD  hydrochlorothiazide (HYDRODIURIL) 25 MG tablet Take 1 tablet (25 mg total) by mouth daily. 06/24/13   Leonie Man, MD  Testosterone (ANDROGEL) 20.25 MG/1.25GM (1.62%) GEL Place 2 application onto the skin daily. (2 pumps each arm)    Historical Provider, MD    Review of Systems  Constitutional: Negative for fatigue and unexpected weight change.  Eyes: Negative for visual disturbance.  Respiratory: Negative for cough, chest tightness and shortness of breath.   Cardiovascular: Negative for chest pain, palpitations and leg swelling.  Gastrointestinal: Negative for abdominal pain and blood in stool.  Neurological: Negative for dizziness, light-headedness and headaches.       Objective:   Physical Exam  Vitals reviewed. Constitutional: He is oriented to person, place, and time. He appears well-developed and well-nourished.  HENT:  Head: Normocephalic and atraumatic.  Eyes: EOM are normal. Pupils are equal, round, and reactive to light.  Neck: No JVD present. Carotid bruit is not present.  Cardiovascular: Normal rate, regular rhythm and normal heart sounds.   No murmur  heard. Pulmonary/Chest: Effort normal and breath sounds normal. He has no rales.  Abdominal:  Non-tender palpable fullness, rounded-feeling and soft, to the left lower abdomen just below skin.  Mobile, non-tender, no erythema, or skin changes.   Musculoskeletal: He exhibits no edema.  Neurological: He is alert and oriented to person, place, and time.  Skin: Skin is warm and dry.  Psychiatric: He has a normal mood and affect.    Filed Vitals:   01/02/14 1609  BP: 134/81  Pulse: 73  Temp: 98.6 F (37 C)  TempSrc: Oral  Resp: 16  Height: 5' 10.25" (1.784 m)  Weight: 234 lb 12.8 oz (106.505 kg)  SpO2: 95%  Assessment & Plan:   Carl Gutierrez is a 68 y.o. male Essential hypertension - Plan: amLODipine (NORVASC) 10 MG tablet  - stable in office.  Home readings may not be accurate, and for this reason, recommended bringing home cuff to next ov. No med changes at this time - refilled at current dose, and labs deferred as future labs prior to Dr. Ronnie Derby visit planned.  Type 2 diabetes mellitus without complication - Plan: metFORMIN (GLUCOPHAGE) 500 MG tablet, HM Diabetes Foot Exam  - prior controlled. Labs deferred as above in anticipation of his visit with Dr. Dwyane Dee.   Recommended CPE with me in next 6 months, and that this needs to be separate from his DOT physical.   Meds ordered this encounter  Medications  . amLODipine (NORVASC) 10 MG tablet    Sig: Take 1 tablet (10 mg total) by mouth daily.    Dispense:  90 tablet    Refill:  3  . metFORMIN (GLUCOPHAGE) 500 MG tablet    Sig: Take 2 tablets with evening meal daily    Dispense:  60 tablet    Refill:  3   Patient Instructions  Keep scheduled appointment with Dr. Dwyane Dee and for labs prior to that office visit.  Schedule a medical physical with me in next 3-6 months. (this needs to be separate from your DOT physical). Bring your blood pressure machine with you to next office visit to see if it is obtaining accurate  readings. Keep a record of your blood pressures outside of the office and bring them to the next office visit. I would recommend a different cholesterol medication as there is some increased risk of muscle problems with simvastatin and amlodipine use. Other option would be lipitor or pravastatin.  Discuss this with your cardiologist, but let me know if you would like me to change this medicine.  Return to the clinic or go to the nearest emergency room if any of your symptoms worsen or new symptoms occur.

## 2014-02-13 ENCOUNTER — Other Ambulatory Visit: Payer: Medicare Other

## 2014-02-14 ENCOUNTER — Other Ambulatory Visit: Payer: Self-pay | Admitting: Endocrinology

## 2014-02-14 ENCOUNTER — Other Ambulatory Visit: Payer: Medicare Other

## 2014-02-14 DIAGNOSIS — E119 Type 2 diabetes mellitus without complications: Secondary | ICD-10-CM | POA: Diagnosis not present

## 2014-02-14 LAB — HEMOGLOBIN A1C
Hgb A1c MFr Bld: 7.4 % — ABNORMAL HIGH (ref <5.7)
Mean Plasma Glucose: 166 mg/dL — ABNORMAL HIGH (ref <117)

## 2014-02-15 LAB — LIPID PANEL
Cholesterol: 178 mg/dL (ref 0–200)
HDL: 51 mg/dL (ref >39)
LDL Cholesterol: 103 mg/dL — ABNORMAL HIGH (ref 0–99)
Total CHOL/HDL Ratio: 3.5 Ratio
Triglycerides: 119 mg/dL (ref <150)
VLDL: 24 mg/dL (ref 0–40)

## 2014-02-15 LAB — COMPREHENSIVE METABOLIC PANEL
ALT: 25 U/L (ref 0–53)
AST: 16 U/L (ref 0–37)
Albumin: 4.5 g/dL (ref 3.5–5.2)
Alkaline Phosphatase: 60 U/L (ref 39–117)
BUN: 18 mg/dL (ref 6–23)
CO2: 25 mEq/L (ref 19–32)
Calcium: 9.5 mg/dL (ref 8.4–10.5)
Chloride: 106 mEq/L (ref 96–112)
Creat: 1.12 mg/dL (ref 0.50–1.35)
Glucose, Bld: 116 mg/dL — ABNORMAL HIGH (ref 70–99)
Potassium: 4.6 mEq/L (ref 3.5–5.3)
Sodium: 141 mEq/L (ref 135–145)
Total Bilirubin: 0.4 mg/dL (ref 0.2–1.2)
Total Protein: 7.2 g/dL (ref 6.0–8.3)

## 2014-02-15 LAB — MICROALBUMIN / CREATININE URINE RATIO
Creatinine, Urine: 159.3 mg/dL
Microalb Creat Ratio: 4.3 mg/g (ref 0.0–30.0)
Microalb, Ur: 0.69 mg/dL (ref 0.00–1.89)

## 2014-02-15 LAB — T4, FREE: Free T4: 1.34 ng/dL (ref 0.80–1.80)

## 2014-02-15 LAB — LDL CHOLESTEROL, DIRECT: Direct LDL: 100 mg/dL — ABNORMAL HIGH

## 2014-02-16 ENCOUNTER — Encounter: Payer: Self-pay | Admitting: Endocrinology

## 2014-02-16 ENCOUNTER — Ambulatory Visit (INDEPENDENT_AMBULATORY_CARE_PROVIDER_SITE_OTHER): Payer: Medicare Other | Admitting: Endocrinology

## 2014-02-16 VITALS — BP 108/74 | HR 76 | Temp 98.2°F | Resp 16 | Wt 238.6 lb

## 2014-02-16 DIAGNOSIS — I10 Essential (primary) hypertension: Secondary | ICD-10-CM

## 2014-02-16 DIAGNOSIS — E785 Hyperlipidemia, unspecified: Secondary | ICD-10-CM

## 2014-02-16 DIAGNOSIS — Z9861 Coronary angioplasty status: Secondary | ICD-10-CM

## 2014-02-16 DIAGNOSIS — E230 Hypopituitarism: Secondary | ICD-10-CM

## 2014-02-16 DIAGNOSIS — E1165 Type 2 diabetes mellitus with hyperglycemia: Secondary | ICD-10-CM

## 2014-02-16 DIAGNOSIS — IMO0001 Reserved for inherently not codable concepts without codable children: Secondary | ICD-10-CM

## 2014-02-16 DIAGNOSIS — I2510 Atherosclerotic heart disease of native coronary artery without angina pectoris: Secondary | ICD-10-CM | POA: Diagnosis not present

## 2014-02-16 LAB — TESTOSTERONE: Testosterone: 113 ng/dL — ABNORMAL LOW (ref 300–890)

## 2014-02-16 NOTE — Progress Notes (Signed)
Carl Gutierrez is a 68 y.o. male.    Chief complaint: Followup of hypogonadism  History of Present Illness:  HYPOPITUITARISM: This is secondary to his pituitary tumor which was treated surgically in 07/2010 He has usually been asymptomatic with his multiple hormone deficiencies except at the time of diagnosis.  History of hypogonadism at baseline levels of about 18. Initially had decreased libido, some fatigue and also erectile dysfunction  Again difficult to assess adequacy of his testosterone supplementation since he usually denies any symptoms even when his testosterone levels are very low. His previous testosterone levels have ranged from 64-252.   He had been variably compliant with his testosterone supplements.   Because of noncompliance with his transdermal therapy he was started on injections  He got his first injection on 10/03/13 with 300 mg and this was repeated on 10/24/13 However he did not followup subsequently for injections and his level is very low again  Again he does does not feel any different and does not complain of any fatigue or libido changes  Lab Results  Component Value Date   TESTOSTERONE 113* 02/14/2014    HYPOTHYROIDISM:  He has secondary hypothyroidism and is currently taking 100 mcg daily. No fatigue or cold intolerance, no significant weight change Has not required changes in his dosage recently and is compliant with his medication Recent free T4 level is 1.34 from a different lab, normal up to 1.8  Lab Results  Component Value Date   FREET4 1.34 02/14/2014   FREET4 1.09 10/03/2013   FREET4 1.03 03/25/2013   FREET4 0.52* 12/18/2009    Has not had any growth hormone deficiency in the past     Medication List       This list is accurate as of: 02/16/14 11:59 PM.  Always use your most recent med list.               amLODipine 10 MG tablet  Commonly known as:  NORVASC  Take 1 tablet (10 mg total) by mouth daily.     aspirin 81 MG tablet  Take 81  mg by mouth daily.     hydrocortisone 20 MG tablet  Commonly known as:  CORTEF  Take 20 mg by mouth every morning.     hydrocortisone 10 MG tablet  Commonly known as:  CORTEF  Take 10 mg by mouth daily. In afternoon     levothyroxine 100 MCG tablet  Commonly known as:  SYNTHROID, LEVOTHROID  Take 100 mcg by mouth daily before breakfast.     lisinopril 20 MG tablet  Commonly known as:  PRINIVIL,ZESTRIL  TAKE 1 TABLET (20 MG TOTAL) BY MOUTH DAILY.     metFORMIN 500 MG tablet  Commonly known as:  GLUCOPHAGE  Take 2 tablets with evening meal daily     OVER THE COUNTER MEDICATION  Vitamin D3 2000 units take in the evening     simvastatin 40 MG tablet  Commonly known as:  ZOCOR  Take 20 mg by mouth every evening.     testosterone cypionate 200 MG/ML injection  Commonly known as:  DEPO-TESTOSTERONE  Inject 1.5 mLs (300 mg total) into the muscle every 21 ( twenty-one) days.        Allergies:  Allergies  Allergen Reactions  . Hydrochlorothiazide     Leg and side pain with this  . Other     Blood pressure med name unknown.    Past Medical History  Diagnosis Date  . Panhypopituitary dwarfism  Status post pituitary adenoma removal in 2012  . Hyperlipidemia   . Hypertension   . Diabetes mellitus     Low-dose metformin  . CAD S/P percutaneous coronary angioplasty February 2004    PCI-LAD: 2.25 mm x 16 mm Express BMS; Ramus Intermedius- 2.75 mm x 16 mm Cypher DES (2.8 mm)  . Hypothyroidism     Synthroid  . History of DVT of lower extremity      postoperative  . DJD (degenerative joint disease), lumbosacral      status post L4-L5 surgery    Past Surgical History  Procedure Laterality Date  . Brain surgery  2012    Removal of pituitary adenoma  . Spine surgery      L4-L5  . Coronary angioplasty with stent placement  February 2004    PCI-dLAD: 2.25 mm x 16 mm BMS;;PCI- RI: 2.75 mm 18 mm Cypher DES  . Cardiac catheterization  November 2008    Significant LAD  tapering but no significant disease the distal stent. Patent Ramus stent. 90% lesion in small OM 2; small nondominant RCA. Medical therapy. EF 50-60%.  . Nm myoview ltd  January 2012    Exercise ~9 min - 10 METS; no ischemia or infarction    No family history on file.  Social History:  reports that he quit smoking about 20 years ago. He has never used smokeless tobacco. He reports that he does not drink alcohol or use illicit drugs.  Review of Systems   History of erectile dysfunction Has previous history of knee surgery  DIABETES: He has had mild diabetes for the last few years and treated by his primary care physician with low dose metformin only  He was seen by the dietitian in 6/14 and was instructed on cut back on high fat foods, balanced breakfast   He has not been able to lose weight, has difficulty walking much because of knee pain although he still does work  His recent A1c is 7.4% from a different lab but the lab glucose was 116  Lab Results  Component Value Date   HGBA1C 7.4* 02/14/2014   HGBA1C 6.4 11/10/2013   HGBA1C 6.6* 10/03/2013   Lab Results  Component Value Date   MICROALBUR 0.69 02/14/2014   LDLCALC 103* 02/14/2014   CREATININE 1.12 02/14/2014     EXAM:  BP 108/74  Pulse 76  Temp(Src) 98.2 F (36.8 C)  Resp 16  Wt 238 lb 9.6 oz (108.228 kg)  SpO2 93%  Standing BP 108/74 No pedal edema  Assessment/Plan:   1. Panhypopituitarism: He has  been asymptomatic with his hypogonadism despite very low levels. He has not been compliant with his injection and will resume this today. Most likely his testosterone level should plateau in about 2-3 months. Discussed  benefits of testosterone supplementation and will need a followup level on the next visit 2. Diabetes: His A1c is surprisingly higher although lab glucose is fairly good. He will need to bring his monitor for review and check more often. Meanwhile will increase his metformin to 3 tablets. Encouraged him to  increase activity and check blood sugars after meals 3. Secondary hypothyroidism: His free T4 is relatively higher and will reduce his levothyroxine dose to 88 mcg for now 4. Secondary adrenal insufficiency. His blood pressure is good and he has no symptoms of adrenal insufficiency 5. Hypertension: Blood pressure is somewhat lower than usual and will reduce his amlodipine to 5 mg. Continue followup with PCP for this also 6.  Hypercholesterolemia: His LDL is relatively high at 103, currently being managed with 40 mg Zocor. May do better with Lipitor or Crestor or especially with his taking amlodipine concurrently, will defer to PCP   [facility] 02/17/2014, 10:05 AM

## 2014-02-16 NOTE — Patient Instructions (Signed)
Amlodipine 1/2 pill daily

## 2014-02-17 ENCOUNTER — Other Ambulatory Visit: Payer: Self-pay | Admitting: *Deleted

## 2014-02-17 DIAGNOSIS — E119 Type 2 diabetes mellitus without complications: Secondary | ICD-10-CM

## 2014-02-17 MED ORDER — METFORMIN HCL 500 MG PO TABS: ORAL_TABLET | ORAL | Status: DC

## 2014-02-17 MED ORDER — LEVOTHYROXINE SODIUM 88 MCG PO TABS: 88.0000 ug | ORAL_TABLET | Freq: Every day | ORAL | Status: DC

## 2014-03-14 ENCOUNTER — Ambulatory Visit (INDEPENDENT_AMBULATORY_CARE_PROVIDER_SITE_OTHER): Payer: Medicare Other | Admitting: *Deleted

## 2014-03-14 DIAGNOSIS — E291 Testicular hypofunction: Secondary | ICD-10-CM | POA: Diagnosis not present

## 2014-03-14 MED ORDER — TESTOSTERONE CYPIONATE 200 MG/ML IM SOLN
300.0000 mg | Freq: Once | INTRAMUSCULAR | Status: AC
  Administered 2014-03-14: 300 mg via INTRAMUSCULAR

## 2014-04-05 DIAGNOSIS — Z23 Encounter for immunization: Secondary | ICD-10-CM | POA: Diagnosis not present

## 2014-04-13 ENCOUNTER — Other Ambulatory Visit: Payer: Self-pay | Admitting: *Deleted

## 2014-04-13 ENCOUNTER — Ambulatory Visit: Payer: Medicare Other | Admitting: *Deleted

## 2014-04-13 DIAGNOSIS — E291 Testicular hypofunction: Secondary | ICD-10-CM

## 2014-04-13 MED ORDER — TESTOSTERONE CYPIONATE 200 MG/ML IM SOLN
300.0000 mg | Freq: Once | INTRAMUSCULAR | Status: AC
  Administered 2014-04-13: 300 mg via INTRAMUSCULAR

## 2014-05-18 ENCOUNTER — Encounter: Payer: Self-pay | Admitting: Endocrinology

## 2014-05-18 ENCOUNTER — Telehealth: Payer: Self-pay

## 2014-05-18 ENCOUNTER — Other Ambulatory Visit: Payer: Self-pay | Admitting: *Deleted

## 2014-05-18 ENCOUNTER — Ambulatory Visit (INDEPENDENT_AMBULATORY_CARE_PROVIDER_SITE_OTHER): Payer: Medicare Other | Admitting: Endocrinology

## 2014-05-18 VITALS — BP 126/74 | HR 77 | Temp 98.3°F | Resp 14 | Ht 70.25 in | Wt 238.4 lb

## 2014-05-18 DIAGNOSIS — E119 Type 2 diabetes mellitus without complications: Secondary | ICD-10-CM | POA: Diagnosis not present

## 2014-05-18 DIAGNOSIS — E1165 Type 2 diabetes mellitus with hyperglycemia: Secondary | ICD-10-CM | POA: Diagnosis not present

## 2014-05-18 DIAGNOSIS — E291 Testicular hypofunction: Secondary | ICD-10-CM

## 2014-05-18 DIAGNOSIS — E780 Pure hypercholesterolemia: Secondary | ICD-10-CM

## 2014-05-18 DIAGNOSIS — E230 Hypopituitarism: Secondary | ICD-10-CM

## 2014-05-18 DIAGNOSIS — I10 Essential (primary) hypertension: Secondary | ICD-10-CM

## 2014-05-18 DIAGNOSIS — Z9861 Coronary angioplasty status: Secondary | ICD-10-CM

## 2014-05-18 DIAGNOSIS — E039 Hypothyroidism, unspecified: Secondary | ICD-10-CM

## 2014-05-18 DIAGNOSIS — IMO0002 Reserved for concepts with insufficient information to code with codable children: Secondary | ICD-10-CM

## 2014-05-18 DIAGNOSIS — Z1211 Encounter for screening for malignant neoplasm of colon: Secondary | ICD-10-CM

## 2014-05-18 LAB — LDL CHOLESTEROL, DIRECT: Direct LDL: 85 mg/dL

## 2014-05-18 LAB — TESTOSTERONE: Testosterone: 79.21 ng/dL — ABNORMAL LOW (ref 300.00–890.00)

## 2014-05-18 LAB — HEMOGLOBIN A1C: Hgb A1c MFr Bld: 6.2 % (ref 4.6–6.5)

## 2014-05-18 LAB — T4, FREE: Free T4: 0.9 ng/dL (ref 0.60–1.60)

## 2014-05-18 MED ORDER — TESTOSTERONE CYPIONATE 200 MG/ML IM SOLN
300.0000 mg | Freq: Once | INTRAMUSCULAR | Status: AC
  Administered 2014-05-18: 300 mg via INTRAMUSCULAR

## 2014-05-18 MED ORDER — TESTOSTERONE CYPIONATE 200 MG/ML IM SOLN: 300.0000 mg | INTRAMUSCULAR | Status: DC

## 2014-05-18 NOTE — Telephone Encounter (Signed)
Needs a referral for a colonoscopy   Best number [phone]

## 2014-05-18 NOTE — Progress Notes (Signed)
Carl Gutierrez is a 68 y.o. male.    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  History of hypogonadism at baseline levels of about 18.  Initially had decreased libido, some fatigue and also erectile dysfunction  Again difficult to assess adequacy of his testosterone supplementation since he usually denies any symptoms even when his testosterone levels are very low. His previous testosterone levels have ranged from 64-252.   He had been poorly compliant with his testosterone supplements.   Because of noncompliance with his transdermal therapy he was started on injections  However even though he has been reminded about taking regular injections he has not made appointments for follow-up injections and has not had any in over 2 months even though he claims he has been coming here every 3 weeks  Again he does does not feel any different and does not complain of any fatigue or libido changes  Lab Results  Component Value Date   TESTOSTERONE 113* 02/14/2014    HYPOTHYROIDISM, secondary:  He has secondary hypothyroidism and is currently taking 88 mcg daily.  His dose was reduced in 9/15 because of relatively higher free T4 level although done from a different lab No fatigue or cold intolerance   Lab Results  Component Value Date   FREET4 1.34 02/14/2014   FREET4 1.09 10/03/2013   FREET4 1.03 03/25/2013   FREET4 0.52* 12/18/2009    Has not had any growth hormone deficiency      Medication List       This list is accurate as of: 05/18/14  3:10 PM.  Always use your most recent med list.               amLODipine 10 MG tablet  Commonly known as:  NORVASC  Take 1 tablet (10 mg total) by mouth daily.     aspirin 81 MG tablet  Take 81 mg by  mouth daily.     hydrocortisone 20 MG tablet  Commonly known as:  CORTEF  Take 20 mg by mouth every morning.     hydrocortisone 10 MG tablet  Commonly known as:  CORTEF  Take 10 mg by mouth daily. In afternoon     levothyroxine 88 MCG tablet  Commonly known as:  SYNTHROID, LEVOTHROID  Take 1 tablet (88 mcg total) by mouth daily.     lisinopril 20 MG tablet  Commonly known as:  PRINIVIL,ZESTRIL  TAKE 1 TABLET (20 MG TOTAL) BY MOUTH DAILY.     metFORMIN 500 MG tablet  Commonly known as:  GLUCOPHAGE  Take 3 tablets with evening meal daily     OVER THE COUNTER MEDICATION  Vitamin D3 2000 units take in the evening     simvastatin 40 MG tablet  Commonly known as:  ZOCOR  Take 20 mg by mouth every evening.     testosterone cypionate 200 MG/ML injection  Commonly known as:  DEPO-TESTOSTERONE  Inject 1.5 mLs (300 mg total) into the muscle every 21 ( twenty-one) days.        Allergies:  Allergies  Allergen Reactions  . Hydrochlorothiazide     Leg and side pain with this  . Other     Blood pressure med name unknown.    Past Medical History  Diagnosis Date  . Panhypopituitary dwarfism  Status post pituitary adenoma removal in 2012  . Hyperlipidemia   . Hypertension   . Diabetes mellitus     Low-dose metformin  . CAD S/P percutaneous coronary angioplasty February 2004    PCI-LAD: 2.25 mm x 16 mm Express BMS; Ramus Intermedius- 2.75 mm x 16 mm Cypher DES (2.8 mm)  . Hypothyroidism     Synthroid  . History of DVT of lower extremity      postoperative  . DJD (degenerative joint disease), lumbosacral      status post L4-L5 surgery    Past Surgical History  Procedure Laterality Date  . Brain surgery  2012    Removal of pituitary adenoma  . Spine surgery      L4-L5  . Coronary angioplasty with stent placement  February 2004    PCI-dLAD: 2.25 mm x 16 mm BMS;;PCI- RI: 2.75 mm 18 mm Cypher DES  . Cardiac catheterization  November 2008    Significant LAD tapering  but no significant disease the distal stent. Patent Ramus stent. 90% lesion in small OM 2; small nondominant RCA. Medical therapy. EF 50-60%.  . Nm myoview ltd  January 2012    Exercise ~9 min - 10 METS; no ischemia or infarction    No family history on file.  Social History:  reports that he quit smoking about 20 years ago. He has never used smokeless tobacco. He reports that he does not drink alcohol or use illicit drugs.  Review of Systems    DIABETES:  He has had mild diabetes for the last few years and treated by his primary care physician with  metformin only  Home sugars  reportedly below 140 However his A1c was up to 7.4 in 9/15 He was told to increase his metformin to 3 tablets a day which he thinks he is doing  He was seen by the dietitian in 6/14 and was instructed on cut back on high fat foods, balanced breakfast   He has not been able to lose weight, has difficulty walking much because of knee pain although he still does work  [facility] Readings from Last 3 Encounters:  05/18/14 238 lb 6.4 oz (108.138 kg)  02/16/14 238 lb 9.6 oz (108.228 kg)  01/02/14 234 lb 12.8 oz (106.505 kg)    Labs:  Lab Results  Component Value Date   HGBA1C 7.4* 02/14/2014   HGBA1C 6.4 11/10/2013   HGBA1C 6.6* 10/03/2013   Lab Results  Component Value Date   MICROALBUR 0.69 02/14/2014   LDLCALC 103* 02/14/2014   CREATININE 1.12 02/14/2014     EXAM:  BP 134/84 mmHg  Pulse 77  Temp(Src) 98.3 F (36.8 C)  Resp 14  Ht 5' 10.25" (1.784 m)  Wt 238 lb 6.4 oz (108.138 kg)  BMI 33.98 kg/m2  SpO2 95%  Repeat sitting blood pressure 138/72 Standing BP 126/74 No pedal edema  Assessment/Plan:   1. 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  Hypogonadism: He has   been asymptomatic with this  very low levels. He has not been compliant with his injections and will try to schedule him regularly.  Also he can try to get his injections with his PCP 2. Diabetes: His A1cwas previously  higher and will need to repeat this today.  He thinks his home doses fairly good and previously  lab glucosewas  fairly good. He will need to bring his monitor for review and check more often. Meanwhile will continue metformin 3 tablets daily of 500 mg  3.  Secondary hypothyroidism: His free T4 needs to be rechecked.  Clinically doing well  4. Secondary adrenal insufficiency. His blood pressure is good  although he tends to have mild drop in orthostatic reading; otherwise has  no symptoms of adrenal insufficiency 5. Hypertension: Blood pressure is good and he will continue  his amlodipine to 5 mg. Continue followup with PCP for this also 6. Hypercholesterolemia: His LDL is relatively high at 103, currently being managed with 40 mg Zocor by PCP  Foch Rosenwald 05/18/2014, 3:10 PM

## 2014-05-18 NOTE — Patient Instructions (Signed)
Stay on 1/2 amlodipine  Do injections every 3 weeks

## 2014-05-19 NOTE — Telephone Encounter (Signed)
Called pt. Whoever answered says he was not home. He is going to call back and ask for the team lead. I was going to tell him back in July, Dr. Carlota Raspberry wanted him to schedule a CPE within 6 months. He can get the referral put in when he sees Dr. Carlota Raspberry for his CPE.  Dr. Carlota Raspberry also wanted him to bring in his blood pressure machine to his next appointment.

## 2014-05-21 LAB — MICROALBUMIN / CREATININE URINE RATIO
Creatinine,U: 124.6 mg/dL
Microalb Creat Ratio: 2.3 mg/g (ref 0.0–30.0)
Microalb, Ur: 2.9 mg/dL — ABNORMAL HIGH (ref 0.0–1.9)

## 2014-05-22 NOTE — Telephone Encounter (Signed)
Pt would like to have the colonoscopy completed prior to his CPE so he may be able to go over results with Dr. Carlota Raspberry. He will be calling back to schedule his CPE appt- he was unable during our phone call. I have pended GI referral for colonoscopy.   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 Pt has been advised to schedule an appt for his CPE- these can not be done at the same time.    LM with wife- she asked to have [place] call him on his cell phone. Pt cell number [phone]

## 2014-05-25 ENCOUNTER — Ambulatory Visit (INDEPENDENT_AMBULATORY_CARE_PROVIDER_SITE_OTHER): Payer: Self-pay | Admitting: Emergency Medicine

## 2014-05-25 VITALS — BP 140/80 | HR 83 | Temp 98.4°F | Resp 18 | Ht 71.0 in | Wt 238.0 lb

## 2014-05-25 DIAGNOSIS — Z021 Encounter for pre-employment examination: Secondary | ICD-10-CM

## 2014-05-25 NOTE — Progress Notes (Signed)
[facility] [address] [place] [address] [phone]  Date:  05/25/2014   Name:  Carl Gutierrez   DOB:  06/18/1945   MRN:  [id number]  PCP:  Kennon Portela, MD    Chief Complaint: Annual Exam   History of Present Illness:  Carl Gutierrez is a 68 y.o. very pleasant male patient who presents with the following:  DOT HBP, DM, panhypopituitarism   Patient Active Problem List   Diagnosis Date Noted  . Obesity (BMI 30-39.9) 06/26/2013  . CAD S/P percutaneous coronary angioplasty 06/26/2013  . Essential hypertension 06/26/2013  . Panhypopituitarism 11/17/2011  .   11/17/2011  . Diabetes mellitus 11/17/2011  . Hyperlipidemia LDL goal <70 11/17/2011    Past Medical History  Diagnosis Date  . Panhypopituitary dwarfism     Status post pituitary adenoma removal in 2012  . Hyperlipidemia   . Hypertension   . Diabetes mellitus     Low-dose metformin  . CAD S/P percutaneous coronary angioplasty February 2004    PCI-LAD: 2.25 mm x 16 mm Express BMS; Ramus Intermedius- 2.75 mm x 16 mm Cypher DES (2.8 mm)  . Hypothyroidism     Synthroid  . History of DVT of lower extremity      postoperative  . DJD (degenerative joint disease), lumbosacral      status post L4-L5 surgery    Past Surgical History  Procedure Laterality Date  . Brain surgery  2012    Removal of pituitary adenoma  . Spine surgery      L4-L5  . Coronary angioplasty with stent placement  February 2004    PCI-dLAD: 2.25 mm x 16 mm BMS;;PCI- RI: 2.75 mm 18 mm Cypher DES  . Cardiac catheterization  November 2008    Significant LAD tapering but no significant disease the distal stent. Patent Ramus stent. 90% lesion in small OM 2; small nondominant RCA. Medical therapy. EF 50-60%.  . Nm myoview ltd  January 2012    Exercise ~9 min - 10 METS; no ischemia or infarction    History  Substance Use Topics  . Smoking status: Former Smoker    Quit date: 06/24/1993  . Smokeless tobacco:  Never Used  . Alcohol Use: No    No family history on file.  Allergies  Allergen Reactions  . Hydrochlorothiazide     Leg and side pain with this  . Other     Blood pressure med name unknown.    Medication list has been reviewed and updated.  Current Outpatient Prescriptions on File Prior to Visit  Medication Sig Dispense Refill  . amLODipine (NORVASC) 10 MG tablet Take 1 tablet (10 mg total) by mouth daily. 90 tablet 3  . aspirin 81 MG tablet Take 81 mg by mouth daily.    . hydrocortisone (CORTEF) 10 MG tablet Take 10 mg by mouth daily. In afternoon    . hydrocortisone (CORTEF) 20 MG tablet Take 20 mg by mouth every morning.     Marland Kitchen levothyroxine (SYNTHROID, LEVOTHROID) 88 MCG tablet Take 1 tablet (88 mcg total) by mouth daily. 30 tablet 3  . lisinopril (PRINIVIL,ZESTRIL) 20 MG tablet TAKE 1 TABLET (20 MG TOTAL) BY MOUTH DAILY. 90 tablet 0  . metFORMIN (GLUCOPHAGE) 500 MG tablet Take 3 tablets with evening meal daily 90 tablet 3  . simvastatin (ZOCOR) 40 MG tablet Take 20 mg by mouth every evening.     . testosterone cypionate (DEPO-TESTOSTERONE) 200 MG/ML injection Inject 1.5 mLs (300  mg total) into the muscle every 21 ( twenty-one) days. 10 mL 3   No current facility-administered medications on file prior to visit.    Review of Systems:  As per HPI, otherwise negative.    Physical Examination: Filed Vitals:   05/25/14 1145  BP: 158/83  Pulse: 83  Temp: 98.4 F (36.9 C)  Resp: 18   Filed Vitals:   05/25/14 1145  Height: 5\' 11"  (1.803 m)  Weight: 238 lb (107.956 kg)   Body mass index is 33.21 kg/(m^2). Ideal Body Weight: Weight in (lb) to have BMI = 25: 178.9  GEN: WDWN, NAD, Non-toxic, A & O x 3 HEENT: Atraumatic, Normocephalic. Neck supple. No masses, No LAD. Ears and Nose: No external deformity. CV: RRR, No M/G/R. No JVD. No thrill. No extra heart sounds. PULM: CTA B, no wheezes, crackles, rhonchi. No retractions. No resp. distress. No accessory muscle  use. ABD: S, NT, ND, +BS. No rebound. No HSM. EXTR: No c/c/e NEURO Normal gait.  PSYCH: Normally interactive. Conversant. Not depressed or anxious appearing.  Calm demeanor.    Assessment and Plan: DOT  Signed,  Ellison Carwin, MD

## 2014-06-09 ENCOUNTER — Other Ambulatory Visit: Payer: Self-pay | Admitting: Endocrinology

## 2014-06-14 ENCOUNTER — Ambulatory Visit (INDEPENDENT_AMBULATORY_CARE_PROVIDER_SITE_OTHER): Payer: Medicare Other | Admitting: *Deleted

## 2014-06-14 DIAGNOSIS — E230 Hypopituitarism: Secondary | ICD-10-CM

## 2014-06-14 MED ORDER — TESTOSTERONE CYPIONATE 200 MG/ML IM SOLN
300.0000 mg | Freq: Once | INTRAMUSCULAR | Status: AC
  Administered 2014-06-14: 300 mg via INTRAMUSCULAR

## 2014-06-20 ENCOUNTER — Encounter: Payer: Self-pay | Admitting: Family Medicine

## 2014-06-22 ENCOUNTER — Ambulatory Visit: Payer: Medicare Other

## 2014-06-27 NOTE — Telephone Encounter (Signed)
Old message °

## 2014-06-29 ENCOUNTER — Encounter: Payer: Self-pay | Admitting: Cardiology

## 2014-06-29 ENCOUNTER — Ambulatory Visit: Payer: Medicare Other | Admitting: Cardiology

## 2014-06-29 ENCOUNTER — Ambulatory Visit (INDEPENDENT_AMBULATORY_CARE_PROVIDER_SITE_OTHER): Payer: Medicare Other | Admitting: Cardiology

## 2014-06-29 VITALS — BP 124/90 | HR 75 | Ht 70.0 in | Wt 237.6 lb

## 2014-06-29 DIAGNOSIS — E785 Hyperlipidemia, unspecified: Secondary | ICD-10-CM

## 2014-06-29 DIAGNOSIS — I2510 Atherosclerotic heart disease of native coronary artery without angina pectoris: Secondary | ICD-10-CM | POA: Diagnosis not present

## 2014-06-29 DIAGNOSIS — I10 Essential (primary) hypertension: Secondary | ICD-10-CM | POA: Diagnosis not present

## 2014-06-29 DIAGNOSIS — Z9861 Coronary angioplasty status: Secondary | ICD-10-CM

## 2014-06-29 DIAGNOSIS — E669 Obesity, unspecified: Secondary | ICD-10-CM

## 2014-06-29 NOTE — Assessment & Plan Note (Signed)
Lab Results  Component Value Date   LDLCALC 103* 02/14/2014   LDL in general as well as total cholesterol are change in the wrong way. He is on simvastatin, would consider possibly adding Zetia versus converting to a more potent statin. Would like to see the trend continue prior to make an adjustment. However if he does continue to have higher cholesterol levels on simvastatin, I would make the adjustment. These are being followed by his PCP, therefore I'll defer to PCP.

## 2014-06-29 NOTE — Patient Instructions (Signed)
Your physician wants you to follow-up in: 1 year with Dr Harding.  You will receive a reminder letter in the mail two months in advance. If you don't receive a letter, please call our office to schedule the follow-up appointment.  

## 2014-06-29 NOTE — Assessment & Plan Note (Signed)
Remained stable with no active symptoms. Continues to be active and walking simply limited by his knee pain. Is on aspirin, statin and ACE inhibitor. Not on beta blocker due to prior history of bradycardia. On calcium channel blocker for antianginal management. On stable regimen. No changes

## 2014-06-29 NOTE — Progress Notes (Signed)
PCP: Kennon Portela, MD  Clinic Note: Chief Complaint  Patient presents with  . Follow-up  . Coronary Artery Disease    History of 2 vessel PCI   HPI: Carl Gutierrez is a 69 y.o. male with a PMH below who presents today for annual follow of CAD.Marland Kitchen  Past Medical History  Diagnosis Date  . Panhypopituitarism     Status post pituitary adenoma removal in 2012  . Hyperlipidemia with target LDL less than 70   . Essential hypertension   . Diabetes mellitus     Low-dose metformin  . CAD S/P percutaneous coronary angioplasty February 2004; 2008    PCI-dLAD: 2.25 mm x 16 mm Express BMS; Ramus- 2.75 mm x 18 mm Cypher DES (2.8 mm); follow-up 11/'08: Patent LAD/ramus stents. 90% small OM. EF 50-60%.; Myoview 1/'12: Exercise 9 min, 10 METS with no ischemia/infarction.   . Hypothyroidism     Synthroid  . History of DVT of lower extremity      postoperative  . DJD (degenerative joint disease), lumbosacral      status post L4-L5 surgery   Interval History: Carl Gutierrez presents today feeling quite well without any major complaints. He is really more limited by his knee pain with walking than anything else. With the amount of exertion is able to do, he denies any resting or exertional chest tightness/pressure to suggest angina or dyspnea. If he really pushes it up a hill he may notice some dyspnea but otherwise not.  He denies any heart failure symptoms of PND, orthopnea or edema.  No palpitations, lightheadedness, dizziness, weakness, syncope/near syncope, or TIA/amaurosis fugax symptoms. No melena, hematochezia, hematuria, or epstaxis. No claudication.  ROS: A comprehensive was performed. Review of Systems  Constitutional: Negative for weight loss and malaise/fatigue.  HENT: Negative for nosebleeds.   Respiratory: Negative for cough, shortness of breath and wheezing.   Gastrointestinal: Negative for blood in stool and melena.  Genitourinary: Negative for hematuria.  Musculoskeletal:  Positive for joint pain (bilateral knees and neck).  Neurological: Negative for dizziness, speech change, focal weakness and loss of consciousness.  Endo/Heme/Allergies: Does not bruise/bleed easily.  All other systems reviewed and are negative.   Current Outpatient Prescriptions on File Prior to Visit  Medication Sig Dispense Refill  . amLODipine (NORVASC) 10 MG tablet Take 1 tablet (10 mg total) by mouth daily. 90 tablet 3  . aspirin 81 MG tablet Take 81 mg by mouth daily.    . hydrocortisone (CORTEF) 10 MG tablet Take 10 mg by mouth daily. In afternoon    . hydrocortisone (CORTEF) 20 MG tablet Take 20 mg by mouth every morning.     Marland Kitchen levothyroxine (SYNTHROID, LEVOTHROID) 88 MCG tablet TAKE 1 TABLET (88 MCG TOTAL) BY MOUTH DAILY. 30 tablet 3  . lisinopril (PRINIVIL,ZESTRIL) 20 MG tablet TAKE 1 TABLET (20 MG TOTAL) BY MOUTH DAILY. 90 tablet 0  . metFORMIN (GLUCOPHAGE) 500 MG tablet Take 3 tablets with evening meal daily 90 tablet 3  . simvastatin (ZOCOR) 40 MG tablet Take 20 mg by mouth every evening.     . testosterone cypionate (DEPO-TESTOSTERONE) 200 MG/ML injection Inject 1.5 mLs (300 mg total) into the muscle every 21 ( twenty-one) days. 10 mL 3   No current facility-administered medications on file prior to visit.   Allergies  Allergen Reactions  . Hydrochlorothiazide     Leg and side pain with this  . Other     Blood pressure med name unknown.   SOCIAL AND FAMILY  HISTORY REVIEWED IN EPIC -- no change  Wt Readings from Last 3 Encounters:  06/29/14 237 lb 9.6 oz (107.775 kg)  05/25/14 238 lb (107.956 kg)  05/18/14 238 lb 6.4 oz (108.138 kg)    PHYSICAL EXAM BP 124/90 mmHg  Pulse 75  Ht 5\' 10"  (1.778 m)  Wt 237 lb 9.6 oz (107.775 kg)  BMI 34.09 kg/m2 General appearance: alert, cooperative, appears stated age, no distress, mildly obese and moderately obese Neck: no adenopathy, no carotid bruit, no JVD, supple, symmetrical, trachea midline and thyroid not enlarged,  symmetric, no tenderness/mass/nodules Lungs: clear to auscultation bilaterally, normal percussion bilaterally and Nonlabored, good air movement Heart: regular rate and rhythm, S1, S2 normal, no murmur, click, rub or gallop and normal apical impulse Abdomen: soft, non-tender; bowel sounds normal; no masses, no organomegaly Extremities: extremities normal, atraumatic, no cyanosis or edema Pulses: 2+ and symmetric Neurologic: Alert and oriented X 3, normal strength and tone. Normal symmetric reflexes. Normal coordination and gait   Adult ECG Report  Rate: 75 ;  Rhythm: normal sinus rhythm, premature atrial contractions (PAC) and Normal axis, intervals, durations and voltage.  Narrative Interpretation: Normal EKG  Recent Labs:    Lab Results  Component Value Date   CHOL 178 02/14/2014   CHOL 136 11/10/2013   CHOL 150 10/03/2013   Lab Results  Component Value Date   HDL 51 02/14/2014   HDL 41.20 11/10/2013   HDL 46.90 10/03/2013   Lab Results  Component Value Date   LDLCALC 103* 02/14/2014   LDLCALC 80 11/10/2013   LDLCALC 77 10/03/2013   Lab Results  Component Value Date   TRIG 119 02/14/2014   TRIG 74.0 11/10/2013   TRIG 133.0 10/03/2013   Lab Results  Component Value Date   CHOLHDL 3.5 02/14/2014   CHOLHDL 3 11/10/2013   CHOLHDL 3 10/03/2013   Lab Results  Component Value Date   LDLDIRECT 85.0 05/18/2014   LDLDIRECT 100* 02/14/2014   trending the wrong direction  ASSESSMENT / PLAN: CAD S/P percutaneous coronary angioplasty Remained stable with no active symptoms. Continues to be active and walking simply limited by his knee pain. Is on aspirin, statin and ACE inhibitor. Not on beta blocker due to prior history of bradycardia. On calcium channel blocker for antianginal management. On stable regimen. No changes   Hyperlipidemia LDL goal <70 Lab Results  Component Value Date   LDLCALC 103* 02/14/2014   LDL in general as well as total cholesterol are change in  the wrong way. He is on simvastatin, would consider possibly adding Zetia versus converting to a more potent statin. Would like to see the trend continue prior to make an adjustment. However if he does continue to have higher cholesterol levels on simvastatin, I would make the adjustment. These are being followed by his PCP, therefore I'll defer to PCP.   Essential hypertension Much better controlled today. He never did start the HCTZ from last visit. Continue current regimen   Obesity (BMI 30-39.9) The patient understands the need to lose weight with diet and exercise. We have discussed specific strategies for this.     No orders of the defined types were placed in this encounter.   No orders of the defined types were placed in this encounter.    Followup: One year   Leonie Man, M.D., M.S. Interventional Cardiologist   Pager # [phone]

## 2014-06-29 NOTE — Assessment & Plan Note (Signed)
The patient understands the need to lose weight with diet and exercise. We have discussed specific strategies for this.  

## 2014-06-29 NOTE — Assessment & Plan Note (Signed)
Much better controlled today. He never did start the HCTZ from last visit. Continue current regimen

## 2014-07-05 ENCOUNTER — Ambulatory Visit (INDEPENDENT_AMBULATORY_CARE_PROVIDER_SITE_OTHER): Payer: Medicare Other | Admitting: *Deleted

## 2014-07-05 ENCOUNTER — Other Ambulatory Visit: Payer: Medicare Other | Admitting: *Deleted

## 2014-07-05 DIAGNOSIS — E230 Hypopituitarism: Secondary | ICD-10-CM

## 2014-07-05 MED ORDER — TESTOSTERONE CYPIONATE 200 MG/ML IM SOLN
350.0000 mg | INTRAMUSCULAR | Status: DC
  Administered 2014-07-05 – 2014-10-18 (×2): 350 mg via INTRAMUSCULAR

## 2014-08-09 ENCOUNTER — Ambulatory Visit (INDEPENDENT_AMBULATORY_CARE_PROVIDER_SITE_OTHER): Payer: Medicare Other

## 2014-08-09 DIAGNOSIS — E230 Hypopituitarism: Secondary | ICD-10-CM

## 2014-08-09 MED ORDER — TESTOSTERONE CYPIONATE 200 MG/ML IM SOLN
200.0000 mg | Freq: Once | INTRAMUSCULAR | Status: AC
  Administered 2014-08-09: 200 mg via INTRAMUSCULAR

## 2014-08-15 ENCOUNTER — Other Ambulatory Visit (INDEPENDENT_AMBULATORY_CARE_PROVIDER_SITE_OTHER): Payer: Medicare Other

## 2014-08-15 DIAGNOSIS — E1165 Type 2 diabetes mellitus with hyperglycemia: Secondary | ICD-10-CM | POA: Diagnosis not present

## 2014-08-15 DIAGNOSIS — E291 Testicular hypofunction: Secondary | ICD-10-CM

## 2014-08-15 DIAGNOSIS — IMO0002 Reserved for concepts with insufficient information to code with codable children: Secondary | ICD-10-CM

## 2014-08-15 LAB — BASIC METABOLIC PANEL
BUN: 17 mg/dL (ref 6–23)
CO2: 27 mEq/L (ref 19–32)
Calcium: 9.4 mg/dL (ref 8.4–10.5)
Chloride: 107 mEq/L (ref 96–112)
Creatinine, Ser: 1.36 mg/dL (ref 0.40–1.50)
GFR: 66.49 mL/min (ref >60.00)
Glucose, Bld: 102 mg/dL — ABNORMAL HIGH (ref 70–99)
Potassium: 4.4 mEq/L (ref 3.5–5.1)
Sodium: 140 mEq/L (ref 135–145)

## 2014-08-15 LAB — HEMOGLOBIN A1C: Hgb A1c MFr Bld: 6.5 % (ref 4.6–6.5)

## 2014-08-15 LAB — TESTOSTERONE: Testosterone: 450.92 ng/dL (ref 300.00–890.00)

## 2014-08-18 ENCOUNTER — Ambulatory Visit (INDEPENDENT_AMBULATORY_CARE_PROVIDER_SITE_OTHER): Payer: Medicare Other | Admitting: Endocrinology

## 2014-08-18 ENCOUNTER — Encounter: Payer: Self-pay | Admitting: Endocrinology

## 2014-08-18 VITALS — BP 139/78 | HR 83 | Temp 98.3°F | Resp 16 | Ht 70.5 in | Wt 239.8 lb

## 2014-08-18 DIAGNOSIS — E291 Testicular hypofunction: Secondary | ICD-10-CM | POA: Diagnosis not present

## 2014-08-18 DIAGNOSIS — E119 Type 2 diabetes mellitus without complications: Secondary | ICD-10-CM

## 2014-08-18 DIAGNOSIS — I2510 Atherosclerotic heart disease of native coronary artery without angina pectoris: Secondary | ICD-10-CM | POA: Diagnosis not present

## 2014-08-18 DIAGNOSIS — Z9861 Coronary angioplasty status: Secondary | ICD-10-CM | POA: Diagnosis not present

## 2014-08-18 NOTE — Patient Instructions (Signed)
Take Metformin 1 in am and 2 in pm  Saline nasal spray 3x daily  Walk daily  Please check blood sugars at least half the time about 2 hours after any meal and 3 times per week on waking up. Please bring blood sugar monitor to each visit. Recommended blood sugar levels about 2 hours after meal is 140-160 and on waking up 90-120

## 2014-08-18 NOTE — Progress Notes (Signed)
Carl Gutierrez is a 69 y.o. male.            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  History of hypogonadism at baseline levels of about 18.  Initially had decreased libido, some fatigue and also erectile dysfunction  Again difficult to assess adequacy of his testosterone supplementation since he usually denies any symptoms even when his testosterone levels are very low. His previous testosterone levels have ranged from 64-252 and did not get adequate levels with AndroGel because of poor compliance.   He had been recently much better compliant with his testosterone injections every 3-4 weeks  His testosterone level is now excellent about a week after his injection Again he does does not feel any different with the change in his level and does not complain of any fatigue or libido changes  Lab Results  Component Value Date   TESTOSTERONE 450.92 08/15/2014    HYPOTHYROIDISM, secondary:  He has secondary hypothyroidism and is currently taking 88 mcg daily.   His dose was reduced in 9/15 because of relatively higher free T4 level  No fatigue or cold intolerance  Free T4 level is now quite normal  Lab Results  Component Value Date   FREET4 0.90 05/18/2014   FREET4 1.34 02/14/2014   FREET4 1.09 10/03/2013   FREET4 1.03 03/25/2013    Has not had growth hormone deficiency when previously tested       Medication List       This list is accurate as of: 08/18/14  4:33 PM.  Always use your most recent med list.               amLODipine 10 MG tablet  Commonly known as:  NORVASC  Take 1 tablet (10 mg total) by mouth daily.     aspirin 81 MG tablet  Take 81 mg by mouth daily.     hydrocortisone 20 MG tablet  Commonly known as:  CORTEF    Take 20 mg by mouth every morning.     hydrocortisone 10 MG tablet  Commonly known as:  CORTEF  Take 10 mg by mouth daily. In afternoon     levothyroxine 88 MCG tablet  Commonly known as:  SYNTHROID, LEVOTHROID  TAKE 1 TABLET (88 MCG TOTAL) BY MOUTH DAILY.     lisinopril 20 MG tablet  Commonly known as:  PRINIVIL,ZESTRIL  TAKE 1 TABLET (20 MG TOTAL) BY MOUTH DAILY.     metFORMIN 500 MG tablet  Commonly known as:  GLUCOPHAGE  Take 3 tablets with evening meal daily     simvastatin 40 MG tablet  Commonly known as:  ZOCOR  Take 20 mg by mouth every evening.     testosterone cypionate 200 MG/ML injection  Commonly known as:  DEPO-TESTOSTERONE  Inject 1.5 mLs (300 mg total) into the muscle every 21 ( twenty-one) days.        Allergies:  Allergies  Allergen Reactions  . Hydrochlorothiazide     Leg and side pain with this  . Other     Blood pressure med name unknown.    Past Medical History  Diagnosis Date  . Panhypopituitarism     Status post pituitary adenoma removal in 2012  . Hyperlipidemia with target LDL less than 70   . Essential  hypertension   . Diabetes mellitus     Low-dose metformin  . CAD S/P percutaneous coronary angioplasty February 2004; 2008    PCI-dLAD: 2.25 mm x 16 mm Express BMS; Ramus- 2.75 mm x 18 mm Cypher DES (2.8 mm); follow-up 11/'08: Patent LAD/ramus stents. 90% small OM. EF 50-60%.; Myoview 1/'12: Exercise 9 min, 10 METS with no ischemia/infarction.   . Hypothyroidism     Synthroid  . History of DVT of lower extremity      postoperative  . DJD (degenerative joint disease), lumbosacral      status post L4-L5 surgery    Past Surgical History  Procedure Laterality Date  . Brain surgery  2012    Removal of pituitary adenoma  . Spine surgery      L4-L5  . Coronary angioplasty with stent placement  February 2004    PCI-dLAD: 2.25 mm x 16 mm BMS;;PCI- RI: 2.75 mm 18 mm Cypher DES  . Cardiac catheterization  November 2008    Significant  LAD tapering but no significant disease the distal stent. Patent Ramus stent. 90% lesion in small OM 2; small nondominant RCA. Medical therapy. EF 50-60%.  . Nm myoview ltd  January 2012    Exercise ~9 min - 10 METS; no ischemia or infarction    No family history on file.  Social History:  reports that he quit smoking about 21 years ago. He has never used smokeless tobacco. He reports that he does not drink alcohol or use illicit drugs.  Review of Systems    DIABETES:  He has had mild diabetes for the last few years and treated by his primary care physician with  metformin only  Home sugars  Reportedly  80-120 When his A1c was up to 7.4 in 9/15 he was told to increase his metformin to 3 tablets a day which he is taking at suppertime once a day  He was seen by the dietitian in 6/14 and was instructed on cut back on high fat foods, balanced breakfast   He has not been able to lose weight, has difficulty walking much because of knee pain   Recent A1c is upper normal although had been as low as 6.2 previously  Wt Readings from Last 3 Encounters:  08/18/14 239 lb 12.8 oz (108.773 kg)  06/29/14 237 lb 9.6 oz (107.775 kg)  05/25/14 238 lb (107.956 kg)    Labs:  Lab Results  Component Value Date   HGBA1C 6.5 08/15/2014   HGBA1C 6.2 05/18/2014   HGBA1C 7.4* 02/14/2014   Lab Results  Component Value Date   MICROALBUR 2.9* 05/18/2014   LDLCALC 103* 02/14/2014   CREATININE 1.36 08/15/2014     EXAM:  BP 139/78 mmHg  Pulse 83  Temp(Src) 98.3 F (36.8 C)  Resp 16  Ht 5' 10.5" (1.791 m)  Wt 239 lb 12.8 oz (108.773 kg)  BMI 33.91 kg/m2  SpO2 96%    Assessment/Plan:   1. 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  Hypogonadism: He has  been asymptomatic even with  very low testosterone levels. He has finally been compliant with his injections and his level is therapeutic 2. Diabetes: His A1c is slightly higher than before at 6.5 but his blood sugars both at home  and in the lab appear to be normal.  Control is improved with increasing metformin 1500 mg and he will continue this but try to take that 3 tablets in a split dose instead of at suppertime. Not clear how often  he checks his blood sugars as he did not bring his monitor again.  Encouraged him to work on his weight loss more 3. Secondary hypothyroidism: His free T4 is quite normal and he is not having any unusual fatigue.  He will continue the same dose  4. Secondary adrenal insufficiency. His blood pressure is not low and has no lightheadedness or nausea.  He'll continue hydrocortisone as before 5.  6. Hypertension: Blood pressure is good and he will continue  his amlodipine 10 mg dose. Continue followup with PCP for this also 6. Hypercholesterolemia: His LDL is relatively high at 103, currently being managed with 40 mg Zocor by PCP  [facility] 08/18/2014, 4:33 PM

## 2014-08-28 ENCOUNTER — Other Ambulatory Visit: Payer: Self-pay | Admitting: Endocrinology

## 2014-09-13 ENCOUNTER — Ambulatory Visit (INDEPENDENT_AMBULATORY_CARE_PROVIDER_SITE_OTHER): Payer: Medicare Other

## 2014-09-13 DIAGNOSIS — E230 Hypopituitarism: Secondary | ICD-10-CM

## 2014-09-13 MED ORDER — TESTOSTERONE CYPIONATE 200 MG/ML IM SOLN
200.0000 mg | Freq: Once | INTRAMUSCULAR | Status: AC
  Administered 2014-09-13: 200 mg via INTRAMUSCULAR

## 2014-10-03 ENCOUNTER — Other Ambulatory Visit: Payer: Self-pay | Admitting: Endocrinology

## 2014-10-18 ENCOUNTER — Ambulatory Visit (INDEPENDENT_AMBULATORY_CARE_PROVIDER_SITE_OTHER): Payer: Medicare Other | Admitting: *Deleted

## 2014-10-18 DIAGNOSIS — E291 Testicular hypofunction: Secondary | ICD-10-CM | POA: Diagnosis not present

## 2014-10-18 MED ORDER — TESTOSTERONE CYPIONATE 200 MG/ML IM SOLN: 300.0000 mg | Freq: Once | INTRAMUSCULAR | Status: DC

## 2014-11-22 ENCOUNTER — Ambulatory Visit: Payer: Medicare Other

## 2014-11-23 ENCOUNTER — Other Ambulatory Visit: Payer: Self-pay | Admitting: *Deleted

## 2014-11-23 ENCOUNTER — Ambulatory Visit: Payer: Medicare Other | Admitting: *Deleted

## 2014-11-23 DIAGNOSIS — E291 Testicular hypofunction: Secondary | ICD-10-CM

## 2014-11-23 MED ORDER — TESTOSTERONE CYPIONATE 200 MG/ML IM SOLN
300.0000 mg | Freq: Once | INTRAMUSCULAR | Status: AC
  Administered 2014-11-23: 300 mg via INTRAMUSCULAR

## 2014-11-23 MED ORDER — TESTOSTERONE CYPIONATE 200 MG/ML IM SOLN: 300.0000 mg | INTRAMUSCULAR | Status: DC

## 2014-12-13 ENCOUNTER — Ambulatory Visit (INDEPENDENT_AMBULATORY_CARE_PROVIDER_SITE_OTHER): Payer: Medicare Other | Admitting: *Deleted

## 2014-12-13 DIAGNOSIS — E230 Hypopituitarism: Secondary | ICD-10-CM

## 2014-12-13 MED ORDER — TESTOSTERONE CYPIONATE 200 MG/ML IM SOLN
300.0000 mg | Freq: Once | INTRAMUSCULAR | Status: AC
  Administered 2014-12-13: 300 mg via INTRAMUSCULAR

## 2014-12-27 ENCOUNTER — Other Ambulatory Visit: Payer: Self-pay | Admitting: Endocrinology

## 2015-01-03 ENCOUNTER — Ambulatory Visit (INDEPENDENT_AMBULATORY_CARE_PROVIDER_SITE_OTHER): Payer: Medicare Other | Admitting: *Deleted

## 2015-01-03 DIAGNOSIS — E291 Testicular hypofunction: Secondary | ICD-10-CM | POA: Diagnosis not present

## 2015-01-03 MED ORDER — TESTOSTERONE CYPIONATE 200 MG/ML IM SOLN
300.0000 mg | Freq: Once | INTRAMUSCULAR | Status: AC
  Administered 2015-01-03: 300 mg via INTRAMUSCULAR

## 2015-01-22 ENCOUNTER — Encounter: Payer: Self-pay | Admitting: Family Medicine

## 2015-01-22 ENCOUNTER — Ambulatory Visit (INDEPENDENT_AMBULATORY_CARE_PROVIDER_SITE_OTHER): Payer: Medicare Other | Admitting: Family Medicine

## 2015-01-22 VITALS — BP 134/78 | HR 82 | Temp 97.5°F | Resp 16 | Ht 70.75 in | Wt 225.8 lb

## 2015-01-22 DIAGNOSIS — Z0000 Encounter for general adult medical examination without abnormal findings: Secondary | ICD-10-CM | POA: Diagnosis not present

## 2015-01-22 DIAGNOSIS — E785 Hyperlipidemia, unspecified: Secondary | ICD-10-CM | POA: Diagnosis not present

## 2015-01-22 DIAGNOSIS — H9192 Unspecified hearing loss, left ear: Secondary | ICD-10-CM | POA: Diagnosis not present

## 2015-01-22 DIAGNOSIS — Z23 Encounter for immunization: Secondary | ICD-10-CM

## 2015-01-22 DIAGNOSIS — I10 Essential (primary) hypertension: Secondary | ICD-10-CM | POA: Diagnosis not present

## 2015-01-22 DIAGNOSIS — N410 Acute prostatitis: Secondary | ICD-10-CM | POA: Diagnosis not present

## 2015-01-22 DIAGNOSIS — Z125 Encounter for screening for malignant neoplasm of prostate: Secondary | ICD-10-CM

## 2015-01-22 DIAGNOSIS — R350 Frequency of micturition: Secondary | ICD-10-CM | POA: Diagnosis not present

## 2015-01-22 DIAGNOSIS — R300 Dysuria: Secondary | ICD-10-CM

## 2015-01-22 LAB — COMPREHENSIVE METABOLIC PANEL
ALT: 17 U/L (ref 9–46)
AST: 13 U/L (ref 10–35)
Albumin: 4.3 g/dL (ref 3.6–5.1)
Alkaline Phosphatase: 59 U/L (ref 40–115)
BUN: 17 mg/dL (ref 7–25)
CO2: 25 mmol/L (ref 20–31)
Calcium: 9.1 mg/dL (ref 8.6–10.3)
Chloride: 104 mmol/L (ref 98–110)
Creat: 1.23 mg/dL — ABNORMAL HIGH (ref 0.70–1.18)
Glucose, Bld: 94 mg/dL (ref 65–99)
Potassium: 4.8 mmol/L (ref 3.5–5.3)
Sodium: 141 mmol/L (ref 135–146)
Total Bilirubin: 0.5 mg/dL (ref 0.2–1.2)
Total Protein: 7.2 g/dL (ref 6.1–8.1)

## 2015-01-22 LAB — LIPID PANEL
Cholesterol: 148 mg/dL (ref 125–200)
HDL: 45 mg/dL (ref >=40)
LDL Cholesterol: 81 mg/dL (ref <130)
Total CHOL/HDL Ratio: 3.3 Ratio (ref <=5.0)
Triglycerides: 108 mg/dL (ref <150)
VLDL: 22 mg/dL (ref <30)

## 2015-01-22 LAB — POCT URINALYSIS DIPSTICK
Bilirubin, UA: NEGATIVE
Blood, UA: NEGATIVE
Glucose, UA: NEGATIVE
Ketones, UA: NEGATIVE
Leukocytes, UA: NEGATIVE
Nitrite, UA: NEGATIVE
Protein, UA: NEGATIVE
Spec Grav, UA: 1.015
Urobilinogen, UA: 0.2
pH, UA: 5

## 2015-01-22 MED ORDER — CIPROFLOXACIN HCL 500 MG PO TABS: 500.0000 mg | ORAL_TABLET | Freq: Two times a day (BID) | ORAL | Status: DC
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
## 2015-01-22 NOTE — Patient Instructions (Addendum)
I would consider stronger/different cholesterol medicine such as Crestor or Lipitor depending on your results form bloodwork today.  Follow up with [facility] for hearing, knees and dentist evaluation.  You should receive a call or letter about your lab results within the next week to 10 days.  Let me know when you had your last colonoscopy, and let me know when you are ready for prostate cancer screening.   Your urine test looked ok in office, but with the discomfort on the prostate, I am concerned about a possible prostate infection.  I will send off a PSA test to check this further, but you can start cipro twice per day for 10 days. If any new muscle or tendon pain, or mental status changes - these may be due to the Cipro.  Return right away if these occur. If your prostate test is elevated - recheck this level in next 4-6 weeks as should improve with antibiotic. Return to the clinic or go to the nearest emergency room if any of your symptoms worsen or new symptoms occur.    Keeping you healthy  Get these tests  Blood pressure- Have your blood pressure checked once a year by your healthcare provider.  Normal blood pressure is 120/80  Weight- Have your body mass index (BMI) calculated to screen for obesity.  BMI is a measure of body fat based on height and weight. You can also calculate your own BMI at [email or web address].  Cholesterol- Have your cholesterol checked every year.  Diabetes- Have your blood sugar checked regularly if you have high blood pressure, high cholesterol, have a family history of diabetes or if you are overweight.  Screening for Colon Cancer- Colonoscopy starting at age 2.  Screening may begin sooner depending on your family history and other health conditions. Follow up colonoscopy as directed by your Gastroenterologist.  Screening for Prostate Cancer- Both blood work (PSA) and a rectal exam help screen for Prostate Cancer.  Screening begins at age 53 with  African-American men and at age 47 with Caucasian men.  Screening may begin sooner depending on your family history.  Take these medicines  Aspirin- One aspirin daily can help prevent Heart disease and Stroke.  Flu shot- Every fall.  Tetanus- Every 10 years.  Zostavax- Once after the age of 33 to prevent Shingles.  Pneumonia shot- Once after the age of 29; if you are younger than 38, ask your healthcare provider if you need a Pneumonia shot.  Take these steps  Don't smoke- If you do smoke, talk to your doctor about quitting.  For tips on how to quit, go to www.smokefree.gov or call 1-800-QUIT-NOW.  Be physically active- Exercise 5 days a week for at least 30 minutes.  If you are not already physically active start slow and gradually work up to 30 minutes of moderate physical activity.  Examples of moderate activity include walking briskly, mowing the yard, dancing, swimming, bicycling, etc.  Eat a healthy diet- Eat a variety of healthy food such as fruits, vegetables, low fat milk, low fat cheese, yogurt, lean meant, poultry, fish, beans, tofu, etc. For more information go to www.thenutritionsource.org  Drink alcohol in moderation- Limit alcohol intake to less than two drinks a day. Never drink and drive.  Dentist- Brush and floss twice daily; visit your dentist twice a year.  Depression- Your emotional health is as important as your physical health. If you're feeling down, or losing interest in things you would normally enjoy please talk to your  healthcare provider.  Eye exam- Visit your eye doctor every year.  Safe sex- If you may be exposed to a sexually transmitted infection, use a condom.  Seat belts- Seat belts can save your life; always wear one.  Smoke/Carbon Monoxide detectors- These detectors need to be installed on the appropriate level of your home.  Replace batteries at least once a year.  Skin cancer- When out in the sun, cover up and use sunscreen 15 SPF or  higher.  Violence- If anyone is threatening you, please tell your healthcare provider.  Living Will/ Health care power of attorney- Speak with your healthcare provider and family.  Prostatitis The prostate gland is about the size and shape of a walnut. It is located just below your bladder. It produces one of the components of semen, which is made up of sperm and the fluids that help nourish and transport it out from the testicles. Prostatitis is inflammation of the prostate gland.  There are four types of prostatitis:  Acute bacterial prostatitis. This is the least common type of prostatitis. It starts quickly and usually is associated with a bladder infection, high fever, and shaking chills. It can occur at any age.  Chronic bacterial prostatitis. This is a persistent bacterial infection in the prostate. It usually develops from repeated acute bacterial prostatitis or acute bacterial prostatitis that was not properly treated. It can occur in men of any age but is most common in middle-aged men whose prostate has begun to enlarge. The symptoms are not as severe as those in acute bacterial prostatitis. Discomfort in the part of your body that is in front of your rectum and below your scrotum (perineum), lower abdomen, or in the head of your penis (glans) may represent your primary discomfort.  Chronic prostatitis (nonbacterial). This is the most common type of prostatitis. It is inflammation of the prostate gland that is not caused by a bacterial infection. The cause is unknown and may be associated with a viral infection or autoimmune disorder.  Prostatodynia (pelvic floor disorder). This is associated with increased muscular tone in the pelvis surrounding the prostate. CAUSES The causes of bacterial prostatitis are bacterial infection. The causes of the other types of prostatitis are unknown.  SYMPTOMS  Symptoms can vary depending upon the type of prostatitis that exists. There can also be  overlap in symptoms. Possible symptoms for each type of prostatitis are listed below. Acute Bacterial Prostatitis  Painful urination.  Fever or chills.  Muscle or joint pains.  Low back pain.  Low abdominal pain.  Inability to empty bladder completely. Chronic Bacterial Prostatitis, Chronic Nonbacterial Prostatitis, and Prostatodynia  Sudden urge to urinate.  Frequent urination.  Difficulty starting urine stream.  Weak urine stream.  Discharge from the urethra.  Dribbling after urination.  Rectal pain.  Pain in the testicles, penis, or tip of the penis.  Pain in the perineum.  Problems with sexual function.  Painful ejaculation.  Bloody semen. DIAGNOSIS  In order to diagnose prostatitis, your health care provider will ask about your symptoms. One or more urine samples will be taken and tested (urinalysis). If the urinalysis result is negative for bacteria, your health care provider may use a finger to feel your prostate (digital rectal exam). This exam helps your health care provider determine if your prostate is swollen and tender. It will also produce a specimen of semen that can be analyzed. TREATMENT  Treatment for prostatitis depends on the cause. If a bacterial infection is the cause, it  can be treated with antibiotic medicine. In cases of chronic bacterial prostatitis, the use of antibiotics for up to 1 month or 6 weeks may be necessary. Your health care provider may instruct you to take sitz baths to help relieve pain. A sitz bath is a bath of hot water in which your hips and buttocks are under water. This relaxes the pelvic floor muscles and often helps to relieve the pressure on your prostate. HOME CARE INSTRUCTIONS   Take all medicines as directed by your health care provider.  Take sitz baths as directed by your health care provider. SEEK MEDICAL CARE IF:   Your symptoms get worse, not better.  You have a fever. SEEK IMMEDIATE MEDICAL CARE IF:   You  have chills.  You feel nauseous or vomit.  You feel lightheaded or faint.  You are unable to urinate.  You have blood or blood clots in your urine. MAKE SURE YOU:  Understand these instructions.  Will watch your condition.  Will get help right away if you are not doing well or get worse. Document Released: 05/30/2000 Document Revised: 06/07/2013 Document Reviewed: 12/20/2012 [facility] Patient Information 2015 [place], [place]. This information is not intended to replace advice given to you by your health care provider. Make sure you discuss any questions you have with your health care provider.

## 2015-01-23 LAB — PSA, MEDICARE: PSA: 5.07 ng/mL — ABNORMAL HIGH (ref <=4.00)

## 2015-01-24 ENCOUNTER — Ambulatory Visit: Payer: Medicare Other

## 2015-01-26 ENCOUNTER — Other Ambulatory Visit: Payer: Self-pay | Admitting: *Deleted

## 2015-01-27 ENCOUNTER — Other Ambulatory Visit: Payer: Self-pay | Admitting: Endocrinology

## 2015-01-29 ENCOUNTER — Ambulatory Visit (INDEPENDENT_AMBULATORY_CARE_PROVIDER_SITE_OTHER): Payer: Medicare Other | Admitting: *Deleted

## 2015-01-29 DIAGNOSIS — E230 Hypopituitarism: Secondary | ICD-10-CM

## 2015-01-29 MED ORDER — TESTOSTERONE CYPIONATE 100 MG/ML IM SOLN
150.0000 mg | Freq: Once | INTRAMUSCULAR | Status: AC
  Administered 2015-01-29: 150 mg via INTRAMUSCULAR

## 2015-01-30 ENCOUNTER — Encounter: Payer: Self-pay | Admitting: Family Medicine

## 2015-02-15 ENCOUNTER — Other Ambulatory Visit: Payer: Medicare Other

## 2015-02-16 ENCOUNTER — Other Ambulatory Visit (INDEPENDENT_AMBULATORY_CARE_PROVIDER_SITE_OTHER): Payer: Medicare Other

## 2015-02-16 DIAGNOSIS — E119 Type 2 diabetes mellitus without complications: Secondary | ICD-10-CM

## 2015-02-16 DIAGNOSIS — E291 Testicular hypofunction: Secondary | ICD-10-CM

## 2015-02-16 LAB — COMPREHENSIVE METABOLIC PANEL
ALT: 20 U/L (ref 0–53)
AST: 15 U/L (ref 0–37)
Albumin: 4.1 g/dL (ref 3.5–5.2)
Alkaline Phosphatase: 61 U/L (ref 39–117)
BUN: 15 mg/dL (ref 6–23)
CO2: 29 mEq/L (ref 19–32)
Calcium: 9.1 mg/dL (ref 8.4–10.5)
Chloride: 105 mEq/L (ref 96–112)
Creatinine, Ser: 1.25 mg/dL (ref 0.40–1.50)
GFR: 73.18 mL/min (ref >60.00)
Glucose, Bld: 94 mg/dL (ref 70–99)
Potassium: 4.2 mEq/L (ref 3.5–5.1)
Sodium: 143 mEq/L (ref 135–145)
Total Bilirubin: 0.3 mg/dL (ref 0.2–1.2)
Total Protein: 7 g/dL (ref 6.0–8.3)

## 2015-02-16 LAB — HEMOGLOBIN A1C: Hgb A1c MFr Bld: 5.8 % (ref 4.6–6.5)

## 2015-02-16 LAB — URINALYSIS, ROUTINE W REFLEX MICROSCOPIC
Bilirubin Urine: NEGATIVE
Ketones, ur: NEGATIVE
Leukocytes, UA: NEGATIVE
Nitrite: NEGATIVE
RBC / HPF: NONE SEEN (ref 0–?)
Specific Gravity, Urine: 1.015 (ref 1.000–1.030)
Total Protein, Urine: NEGATIVE
Urine Glucose: NEGATIVE
Urobilinogen, UA: 0.2 (ref 0.0–1.0)
WBC, UA: NONE SEEN (ref 0–?)
pH: 6 (ref 5.0–8.0)

## 2015-02-16 LAB — T4, FREE: Free T4: 1.12 ng/dL (ref 0.60–1.60)

## 2015-02-16 LAB — TESTOSTERONE: Testosterone: 205.23 ng/dL — ABNORMAL LOW (ref 300.00–890.00)

## 2015-02-21 ENCOUNTER — Encounter: Payer: Self-pay | Admitting: Endocrinology

## 2015-02-21 ENCOUNTER — Ambulatory Visit (INDEPENDENT_AMBULATORY_CARE_PROVIDER_SITE_OTHER): Payer: Medicare Other | Admitting: Endocrinology

## 2015-02-21 VITALS — BP 124/68 | HR 78 | Temp 98.6°F | Resp 16 | Ht 70.75 in | Wt 227.0 lb

## 2015-02-21 DIAGNOSIS — E291 Testicular hypofunction: Secondary | ICD-10-CM | POA: Diagnosis not present

## 2015-02-21 DIAGNOSIS — E230 Hypopituitarism: Secondary | ICD-10-CM

## 2015-02-21 DIAGNOSIS — Z9861 Coronary angioplasty status: Secondary | ICD-10-CM | POA: Diagnosis not present

## 2015-02-21 DIAGNOSIS — E119 Type 2 diabetes mellitus without complications: Secondary | ICD-10-CM | POA: Diagnosis not present

## 2015-02-21 DIAGNOSIS — I10 Essential (primary) hypertension: Secondary | ICD-10-CM

## 2015-02-21 DIAGNOSIS — I2510 Atherosclerotic heart disease of native coronary artery without angina pectoris: Secondary | ICD-10-CM

## 2015-02-21 MED ORDER — TESTOSTERONE CYPIONATE 100 MG/ML IM SOLN
150.0000 mg | Freq: Once | INTRAMUSCULAR | Status: AC
  Administered 2015-02-21: 150 mg via INTRAMUSCULAR

## 2015-02-21 NOTE — Progress Notes (Signed)
Carl Gutierrez is a 69 y.o. male.            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  History of hypogonadism at baseline levels of about 18.  Initially had decreased libido, some fatigue and also erectile dysfunction  Again difficult to assess adequacy of his testosterone supplementation since he has not had any symptoms even when his testosterone levels are low.  His previous testosterone levels have ranged from 64-252 and did not get adequate levels with AndroGel because of poor compliance.   He was switched to testosterone injections and is supposed to come every 3 weeks but is not always on time for this His testosterone level previously was over 400 but now it is relatively low about 2 weeks after his injection, taking 300 mg every 3-4 weeks  Again he does does not feel any different with the change in his level and does not complain of any fatigue or libido changes  Lab Results  Component Value Date   TESTOSTERONE 205.23* 02/16/2015    HYPOTHYROIDISM, secondary:  He has secondary hypothyroidism and is currently taking 88 mcg daily.   His dose was reduced in 9/15 because of relatively higher free T4 level  No fatigue or cold intolerance  Free T4 level is now quite normal  Lab Results  Component Value Date   FREET4 1.12 02/16/2015   FREET4 0.90 05/18/2014   FREET4 1.34 02/14/2014   FREET4 1.09 10/03/2013    Has not had growth hormone deficiency when previously tested       Medication List       This list is accurate as of: 02/21/15  9:28 PM.  Always use your most recent med list.               amLODipine 10 MG tablet  Commonly known as:  NORVASC  Take 1 tablet (10 mg total) by mouth daily.     aspirin 81 MG tablet  Take  81 mg by mouth daily.     hydrocortisone 20 MG tablet  Commonly known as:  CORTEF  Take 20 mg by mouth every morning.     hydrocortisone 10 MG tablet  Commonly known as:  CORTEF  Take 10 mg by mouth daily. In afternoon     levothyroxine 88 MCG tablet  Commonly known as:  SYNTHROID, LEVOTHROID  TAKE 1 TABLET (88 MCG TOTAL) BY MOUTH DAILY.     lisinopril 20 MG tablet  Commonly known as:  PRINIVIL,ZESTRIL  TAKE 1 TABLET (20 MG TOTAL) BY MOUTH DAILY.     metFORMIN 500 MG tablet  Commonly known as:  GLUCOPHAGE  TAKE 3 TABLETS WITH EVENING MEAL DAILY     simvastatin 40 MG tablet  Commonly known as:  ZOCOR  Take 20 mg by mouth every evening.     testosterone cypionate 200 MG/ML injection  Commonly known as:  DEPO-TESTOSTERONE  Inject 1.5 mLs (300 mg total) into the muscle every 21 ( twenty-one) days.        Allergies:  Allergies  Allergen Reactions  . Hydrochlorothiazide     Leg and side pain with this  . Other     Blood pressure med name unknown.    Past Medical History  Diagnosis Date  . Panhypopituitarism  Status post pituitary adenoma removal in 2012  . Hyperlipidemia with target LDL less than 70   . Essential hypertension   . Diabetes mellitus     Low-dose metformin  . CAD S/P percutaneous coronary angioplasty February 2004; 2008    PCI-dLAD: 2.25 mm x 16 mm Express BMS; Ramus- 2.75 mm x 18 mm Cypher DES (2.8 mm); follow-up 11/'08: Patent LAD/ramus stents. 90% small OM. EF 50-60%.; Myoview 1/'12: Exercise 9 min, 10 METS with no ischemia/infarction.   . Hypothyroidism     Synthroid  . History of DVT of lower extremity      postoperative  . DJD (degenerative joint disease), lumbosacral      status post L4-L5 surgery    Past Surgical History  Procedure Laterality Date  . Brain surgery  2012    Removal of pituitary adenoma  . Spine surgery      L4-L5  . Coronary angioplasty with stent placement  February 2004    PCI-dLAD: 2.25 mm x 16 mm BMS;;PCI- RI:  2.75 mm 18 mm Cypher DES  . Cardiac catheterization  November 2008    Significant LAD tapering but no significant disease the distal stent. Patent Ramus stent. 90% lesion in small OM 2; small nondominant RCA. Medical therapy. EF 50-60%.  . Nm myoview ltd  January 2012    Exercise ~9 min - 10 METS; no ischemia or infarction    No family history on file.  Social History:  reports that he quit smoking about 21 years ago. He has never used smokeless tobacco. He reports that he does not drink alcohol or use illicit drugs.  Review of Systems    DIABETES:  He has had mild diabetes for the last few years and treated by his primary care physician with  metformin only  Home sugar not checked When his A1c was up to 7.4 in 9/15 he was told to increase his metformin to 3 tablets a day which he is taking at suppertime once a day  He was seen by the dietitian in 6/14 and was instructed on cut back on high fat foods, balanced breakfast   He has not been able to lose weight, has difficulty walking much because of knee pain   Recent A1c is upper normal although had been 6.5 on the last visit, has not done any differently with diet or exercise  Wt Readings from Last 3 Encounters:  02/21/15 227 lb (102.967 kg)  01/22/15 225 lb 12.8 oz (102.422 kg)  08/18/14 239 lb 12.8 oz (108.773 kg)    Labs:  Lab Results  Component Value Date   HGBA1C 5.8 02/16/2015   HGBA1C 6.5 08/15/2014   HGBA1C 6.2 05/18/2014   Lab Results  Component Value Date   MICROALBUR 2.9* 05/18/2014   LDLCALC 81 01/22/2015   CREATININE 1.25 02/16/2015   HYPERCHOLESTEROLEMIA: Treated with simvastatin  Lab Results  Component Value Date   CHOL 148 01/22/2015   HDL 45 01/22/2015   LDLCALC 81 01/22/2015   LDLDIRECT 85.0 05/18/2014   TRIG 108 01/22/2015   CHOLHDL 3.3 01/22/2015     EXAM:  BP 124/68 mmHg  Pulse 78  Temp(Src) 98.6 F (37 C)  Resp 16  Ht 5' 10.75" (1.797 m)  Wt 227 lb (102.967 kg)  BMI 31.89 kg/m2   SpO2 96%  Diabetic foot exam shows normal monofilament sensation in the toes and plantar surfaces, no skin lesions or ulcers on the feet and normal pedal pulses No edema  Assessment/Plan:  1. 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  Although it has been a few years since his surgery he still continues to demonstrate features of hypopituitarism and dependency on multiple hormone replacement therapies 2.   Hypogonadism: He has  been previously asymptomatic even with  very low testosterone levels. He has been  getting injections but not always every 3 weeks; however even though  his level is relatively  Low because of his lack of symptoms will not increase his dose above the 300 mg.   Reminded him that  he needs to be more consistent with coming  Every 3 weeks for injections.    3. Diabetes: His A1c much better now even though he has not monitored his blood sugars at home and taking only on metformin for treatment.  Encouraged him to try working on his diet and exercise for weight loss He also needs to check his blood sugars periodically Encouraged him to work on his weight loss more Discussed basic foot care  4. Secondary hypothyroidism: His free T4 is quite normal and he is not having any unusual fatigue.  He will continue the same dose of 88 g levothyroxine   5. Secondary adrenal insufficiency. His blood pressure is  good  and has no lightheadedness  on standing up or nausea.  He will continue hydrocortisone as before.  Discussed need to continue this indefinitely and also to take stress doses for any acute illness  6. Hypertension: Blood pressure is good and he will continue  his amlodipine 10 mg and lisinopril. Continue followup with PCP for this also 7. Hypercholesterolemia: His LDL is below 100,  currently being managed with 40 mg Zocor by PCP  Total visit time for multiple problem evaluation, exam, counseling and lab review = 25 minutes  Nikiya Starn 02/21/2015, 9:28 PM

## 2015-02-27 ENCOUNTER — Other Ambulatory Visit: Payer: Self-pay | Admitting: *Deleted

## 2015-02-27 MED ORDER — LEVOTHYROXINE SODIUM 88 MCG PO TABS: ORAL_TABLET | ORAL | Status: DC

## 2015-02-27 MED ORDER — METFORMIN HCL 500 MG PO TABS: ORAL_TABLET | ORAL | Status: DC

## 2015-02-28 ENCOUNTER — Ambulatory Visit: Payer: Medicare Other

## 2015-03-08 ENCOUNTER — Other Ambulatory Visit: Payer: Self-pay | Admitting: Family Medicine

## 2015-03-14 ENCOUNTER — Ambulatory Visit (INDEPENDENT_AMBULATORY_CARE_PROVIDER_SITE_OTHER): Payer: Medicare Other | Admitting: *Deleted

## 2015-03-14 DIAGNOSIS — E230 Hypopituitarism: Secondary | ICD-10-CM

## 2015-03-14 MED ORDER — TESTOSTERONE CYPIONATE 200 MG/ML IM SOLN
300.0000 mg | Freq: Once | INTRAMUSCULAR | Status: AC
  Administered 2015-03-14: 300 mg via INTRAMUSCULAR

## 2015-03-19 ENCOUNTER — Ambulatory Visit: Payer: Self-pay | Admitting: Family Medicine

## 2015-04-02 ENCOUNTER — Ambulatory Visit (INDEPENDENT_AMBULATORY_CARE_PROVIDER_SITE_OTHER): Payer: Medicare Other | Admitting: Family Medicine

## 2015-04-02 ENCOUNTER — Encounter: Payer: Self-pay | Admitting: Family Medicine

## 2015-04-02 VITALS — BP 151/89 | HR 80 | Temp 98.4°F | Resp 16 | Ht 70.5 in | Wt 228.0 lb

## 2015-04-02 DIAGNOSIS — I10 Essential (primary) hypertension: Secondary | ICD-10-CM

## 2015-04-02 DIAGNOSIS — R972 Elevated prostate specific antigen [PSA]: Secondary | ICD-10-CM | POA: Diagnosis not present

## 2015-04-02 DIAGNOSIS — I2510 Atherosclerotic heart disease of native coronary artery without angina pectoris: Secondary | ICD-10-CM

## 2015-04-02 DIAGNOSIS — R350 Frequency of micturition: Secondary | ICD-10-CM | POA: Diagnosis not present

## 2015-04-02 DIAGNOSIS — Z125 Encounter for screening for malignant neoplasm of prostate: Secondary | ICD-10-CM

## 2015-04-02 DIAGNOSIS — Z9861 Coronary angioplasty status: Secondary | ICD-10-CM

## 2015-04-02 DIAGNOSIS — R3989 Other symptoms and signs involving the genitourinary system: Secondary | ICD-10-CM

## 2015-04-02 NOTE — Progress Notes (Signed)
Subjective:  This chart was scribed for Carl Ray, MD by [name], Medical Scribe. This patient was seen in room 24 and the patient's care was started 5:07 PM.    Patient ID: Carl Gutierrez, male    DOB: 12-23-1945, 69 y.o.   MRN: [id number]  HPI Carl Gutierrez is a 69 y.o. male Here for follow up. Last visit with me for annual wellness visit was oct 8. He was treated for possible prostatitis with cipro 500 mg BID; PSA 5.07 elevated, which was up from 2.26 2 years ago. He is here for repeat PSA and reevaluation.   Urinary symptoms He still has urinary frequency. He denies any change after treatment. He denies history of prostate issues. He denies dysuria, fever, abd pain.   Elevated BP He checks BP regularly at home. He informs that his BP is up today (158/78) due to the foods he ate earlier.    Patient Active Problem List   Diagnosis Date Noted  . Obesity (BMI 30-39.9) 06/26/2013  . Essential hypertension 06/26/2013  . Panhypopituitarism ([place]) 11/17/2011  .   11/17/2011  . Diabetes mellitus ([place]) 11/17/2011  . Hyperlipidemia LDL goal <70 11/17/2011  . CAD S/P percutaneous coronary angioplasty 07/19/2002   Past Medical History  Diagnosis Date  . Panhypopituitarism ([place])     Status post pituitary adenoma removal in 2012  . Hyperlipidemia with target LDL less than 70   . Essential hypertension   . Diabetes mellitus     Low-dose metformin  . CAD S/P percutaneous coronary angioplasty February 2004; 2008    PCI-dLAD: 2.25 mm x 16 mm Express BMS; Ramus- 2.75 mm x 18 mm Cypher DES (2.8 mm); follow-up 11/'08: Patent LAD/ramus stents. 90% small OM. EF 50-60%.; Myoview 1/'12: Exercise 9 min, 10 METS with no ischemia/infarction.   . Hypothyroidism     Synthroid  . History of DVT of lower extremity      postoperative  . DJD (degenerative joint disease), lumbosacral      status post L4-L5 surgery   Past Surgical History  Procedure Laterality Date  . Brain surgery  2012   Removal of pituitary adenoma  . Spine surgery      L4-L5  . Coronary angioplasty with stent placement  February 2004    PCI-dLAD: 2.25 mm x 16 mm BMS;;PCI- RI: 2.75 mm 18 mm Cypher DES  . Cardiac catheterization  November 2008    Significant LAD tapering but no significant disease the distal stent. Patent Ramus stent. 90% lesion in small OM 2; small nondominant RCA. Medical therapy. EF 50-60%.  . Nm myoview ltd  January 2012    Exercise ~9 min - 10 METS; no ischemia or infarction   Allergies  Allergen Reactions  . Hydrochlorothiazide     Leg and side pain with this  . Other     Blood pressure med name unknown.   Prior to Admission medications   Medication Sig Start Date End Date Taking? Authorizing Provider  amLODipine (NORVASC) 10 MG tablet TAKE 1 TABLET (10 MG TOTAL) BY MOUTH DAILY. 03/09/15   Wendie Agreste, MD  aspirin 81 MG tablet Take 81 mg by mouth daily.    Historical Provider, MD  hydrocortisone (CORTEF) 10 MG tablet Take 10 mg by mouth daily. In afternoon    Historical Provider, MD  hydrocortisone (CORTEF) 20 MG tablet Take 20 mg by mouth every morning.     Historical Provider, MD  levothyroxine (SYNTHROID, LEVOTHROID) 88 MCG tablet TAKE 1  TABLET (88 MCG TOTAL) BY MOUTH DAILY. 02/27/15   Elayne Snare, MD  lisinopril (PRINIVIL,ZESTRIL) 20 MG tablet TAKE 1 TABLET (20 MG TOTAL) BY MOUTH DAILY. 04/20/13   Orma Flaming, MD  metFORMIN (GLUCOPHAGE) 500 MG tablet TAKE 3 TABLETS WITH EVENING MEAL DAILY 02/27/15   Elayne Snare, MD  simvastatin (ZOCOR) 40 MG tablet Take 20 mg by mouth every evening.     Historical Provider, MD   Social History   Social History  . Marital Status: Married    Spouse Name: N/A  . Number of Children: N/A  . Years of Education: N/A   Occupational History  . Not on file.   Social History Main Topics  . Smoking status: Former Smoker    Quit date: 06/24/1993  . Smokeless tobacco: Never Used  . Alcohol Use: No  . Drug Use: No  . Sexual Activity: Not on  file   Other Topics Concern  . Not on file   Social History Narrative   He is a married father of 2, grandfather 52. Exercises only occasionally. Not as much as he desires 2. 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   He does not smoke cigarettes -- quit in 1995. Does not drink alcohol.     Review of Systems  Constitutional: Negative for fever.  Gastrointestinal: Negative for abdominal pain.  Genitourinary: Positive for frequency. Negative for dysuria and urgency.       Objective:   Physical Exam  Filed Vitals:   04/02/15 1633 04/02/15 1639  BP: 158/78 151/89  Pulse: 80   Temp: 98.4 F (36.9 C)   TempSrc: Oral   Resp: 16   Height: 5' 10.5" (1.791 m)   Weight: 228 lb (103.42 kg)          Assessment & Plan:  [name] is a 69 y.o. male Urinary frequency - Plan: PSA, Medicare, Ambulatory referral to Urology Abnormal prostate exam - Plan: PSA, Medicare, Ambulatory referral to Urology Elevated PSA - Plan: PSA, Medicare, Ambulatory referral to Urology Special screening for malignant neoplasm of prostate - Plan: PSA, Medicare  -still persistent urinary frequency, without change from Cipro. ON exam nontender, but suspicious area on R/mid prostate.   -repeat PSA, but also will refer to Urology for further eval and repeat exam.   Essential hypertension  Elevated in office. If remaining over 140/90 out of office - adjust regimen. Watch diet.   No orders of the defined types were placed in this encounter.   Patient Instructions  We will check another PSA, but there is a possible firm area/enlargement of your prostate. I will refer you to urology for evaluation of this.  Return to the clinic or go to the nearest emergency room if any of your symptoms worsen or new symptoms occur.  Keep a record of your blood pressures outside of the office and if remaining over 140/90 - return for recheck and adjusting meds.    Urinary Frequency The number of times a normal person urinates  depends upon how much liquid they take in and how much liquid they are losing. If the temperature is hot and there is high humidity, then the person will sweat more and usually breathe a little more frequently. These factors decrease the amount of frequency of urination that would be considered normal. The amount you drink is easily determined, but the amount of fluid lost is sometimes more difficult to calculate.  Fluid is lost in two ways:  Sensible fluid loss is  usually measured by the amount of urine that you get rid of. Losses of fluid can also occur with diarrhea.  Insensible fluid loss is more difficult to measure. It is caused by evaporation. Insensible loss of fluid occurs through breathing and sweating. It usually ranges from a little less than a quart to a little more than a quart of fluid a day. In normal temperatures and activity levels, the average person may urinate 4 to 7 times in a 24-hour period. Needing to urinate more often than that could indicate a problem. If one urinates 4 to 7 times in 24 hours and has large volumes each time, that could indicate a different problem from one who urinates 4 to 7 times a day and has small volumes. The time of urinating is also important. Most urinating should be done during the waking hours. Getting up at night to urinate frequently can indicate some problems. CAUSES  The bladder is the organ in your lower abdomen that holds urine. Like a balloon, it swells some as it fills up. Your nerves sense this and tell you it is time to head for the bathroom. There are a number of reasons that you might feel the need to urinate more often than usual. They include:  Urinary tract infection. This is usually associated with other signs such as burning when you urinate.  In men, problems with the prostate (a walnut-size gland that is located near the tube that carries urine out of your body). There are two reasons why the prostate can cause an increased  frequency of urination:  An enlarged prostate that does not let the bladder empty well. If the bladder only half empties when you urinate, then it only has half the capacity to fill before you have to urinate again.  The nerves in the bladder become more hypersensitive with an increased size of the prostate even if the bladder empties completely.  Pregnancy.  Obesity. Excess weight is more likely to cause a problem for women than for men.  Bladder stones or other bladder problems.  Caffeine.  Alcohol.  Medications. For example, drugs that help the body get rid of extra fluid (diuretics) increase urine production. Some other medicines must be taken with lots of fluids.  Muscle or nerve weakness. This might be the result of a spinal cord injury, a stroke, multiple sclerosis, or Parkinson disease.  Long-standing diabetes can decrease the sensation of the bladder. This loss of sensation makes it harder to sense the bladder needs to be emptied. Over a period of years, the bladder is stretched out by constant overfilling. This weakens the bladder muscles so that the bladder does not empty well and has less capacity to fill with new urine.  Interstitial cystitis (also called painful bladder syndrome). This condition develops because the tissues that line the inside of the bladder are inflamed (inflammation is the body's way of reacting to injury or infection). It causes pain and frequent urination. It occurs in women more often than in men. DIAGNOSIS   To decide what might be causing your urinary frequency, your health care provider will probably:  Ask about symptoms you have noticed.  Ask about your overall health. This will include questions about any medications you are taking.  Do a physical examination.  Order some tests. These might include:  A blood test to check for diabetes or other health issues that could be contributing to the problem.  Urine testing. This could measure the  flow of urine  and the pressure on the bladder.  A test of your neurological system (the brain, spinal cord, and nerves). This is the system that senses the need to urinate.  A bladder test to check whether it is emptying completely when you urinate.  Cystoscopy. This test uses a thin tube with a tiny camera on it. It offers a look inside your urethra and bladder to see if there are problems.  Imaging tests. You might be given a contrast dye and then asked to urinate. X-rays are taken to see how your bladder is working. TREATMENT  It is important for you to be evaluated to determine if the amount or frequency that you have is unusual or abnormal. If it is found to be abnormal, the cause should be determined and this can usually be found out easily. Depending upon the cause, treatment could include medication, stimulation of the nerves, or surgery. There are not too many things that you can do as an individual to change your urinary frequency. It is important that you balance the amount of fluid intake needed to compensate for your activity and the temperature. Medical problems will be diagnosed and taken care of by your physician. There is no particular bladder training such as Kegel exercises that you can do to help urinary frequency. This is an exercise that is usually recommended for people who have leaking of urine when they laugh, cough, or sneeze. HOME CARE INSTRUCTIONS   Take any medications your health care provider prescribed or suggested. Follow the directions carefully.  Practice any lifestyle changes that are recommended. These might include:  Drinking less fluid or drinking at different times of the day. If you need to urinate often during the night, for example, you may need to stop drinking fluids early in the evening.  Cutting down on caffeine or alcohol. They both can make you need to urinate more often than normal. Caffeine is found in coffee, tea, and sodas.  Losing weight, if  that is recommended.  Keep a journal or a log. You might be asked to record how much you drink and when and where you feel the need to urinate. This will also help evaluate how well the treatment provided by your physician is working. SEEK MEDICAL CARE IF:   Your need to urinate often gets worse.  You feel increased pain or irritation when you urinate.  You notice blood in your urine.  You have questions about any medications that your health care provider recommended.  You notice blood, pus, or swelling at the site of any test or treatment procedure.  You develop a fever of more than 100.19F (38.1C). SEEK IMMEDIATE MEDICAL CARE IF:  You develop a fever of more than 102.21F (38.9C).   This information is not intended to replace advice given to you by your health care provider. Make sure you discuss any questions you have with your health care provider.   Document Released: 03/29/2009 Document Revised: 06/23/2014 Document Reviewed: 03/29/2009 Elsevier Interactive Patient Education [employer].       By signing my name below, I, [name], attest that this documentation has been prepared under the direction and in the presence of Carl Ray, MD. Electronically Signed: [name], [place]. 04/02/2015 , 5:16 PM .  I personally performed the services described in this documentation, which was scribed in my presence. The recorded information has been reviewed and considered, and addended by me as needed.

## 2015-04-02 NOTE — Patient Instructions (Addendum)
We will check another PSA, but there is a possible firm area/enlargement of your prostate. I will refer you to urology for evaluation of this.  Return to the clinic or go to the nearest emergency room if any of your symptoms worsen or new symptoms occur.  Keep a record of your blood pressures outside of the office and if remaining over 140/90 - return for recheck and adjusting meds.    Urinary Frequency The number of times a normal person urinates depends upon how much liquid they take in and how much liquid they are losing. If the temperature is hot and there is high humidity, then the person will sweat more and usually breathe a little more frequently. These factors decrease the amount of frequency of urination that would be considered normal. The amount you drink is easily determined, but the amount of fluid lost is sometimes more difficult to calculate.  Fluid is lost in two ways:  Sensible fluid loss is usually measured by the amount of urine that you get rid of. Losses of fluid can also occur with diarrhea.  Insensible fluid loss is more difficult to measure. It is caused by evaporation. Insensible loss of fluid occurs through breathing and sweating. It usually ranges from a little less than a quart to a little more than a quart of fluid a day. In normal temperatures and activity levels, the average person may urinate 4 to 7 times in a 24-hour period. Needing to urinate more often than that could indicate a problem. If one urinates 4 to 7 times in 24 hours and has large volumes each time, that could indicate a different problem from one who urinates 4 to 7 times a day and has small volumes. The time of urinating is also important. Most urinating should be done during the waking hours. Getting up at night to urinate frequently can indicate some problems. CAUSES  The bladder is the organ in your lower abdomen that holds urine. Like a balloon, it swells some as it fills up. Your nerves sense this  and tell you it is time to head for the bathroom. There are a number of reasons that you might feel the need to urinate more often than usual. They include:  Urinary tract infection. This is usually associated with other signs such as burning when you urinate.  In men, problems with the prostate (a walnut-size gland that is located near the tube that carries urine out of your body). There are two reasons why the prostate can cause an increased frequency of urination:  An enlarged prostate that does not let the bladder empty well. If the bladder only half empties when you urinate, then it only has half the capacity to fill before you have to urinate again.  The nerves in the bladder become more hypersensitive with an increased size of the prostate even if the bladder empties completely.  Pregnancy.  Obesity. Excess weight is more likely to cause a problem for women than for men.  Bladder stones or other bladder problems.  Caffeine.  Alcohol.  Medications. For example, drugs that help the body get rid of extra fluid (diuretics) increase urine production. Some other medicines must be taken with lots of fluids.  Muscle or nerve weakness. This might be the result of a spinal cord injury, a stroke, multiple sclerosis, or Parkinson disease.  Long-standing diabetes can decrease the sensation of the bladder. This loss of sensation makes it harder to sense the bladder needs to be emptied.  Over a period of years, the bladder is stretched out by constant overfilling. This weakens the bladder muscles so that the bladder does not empty well and has less capacity to fill with new urine.  Interstitial cystitis (also called painful bladder syndrome). This condition develops because the tissues that line the inside of the bladder are inflamed (inflammation is the body's way of reacting to injury or infection). It causes pain and frequent urination. It occurs in women more often than in men. DIAGNOSIS   To  decide what might be causing your urinary frequency, your health care provider will probably:  Ask about symptoms you have noticed.  Ask about your overall health. This will include questions about any medications you are taking.  Do a physical examination.  Order some tests. These might include:  A blood test to check for diabetes or other health issues that could be contributing to the problem.  Urine testing. This could measure the flow of urine and the pressure on the bladder.  A test of your neurological system (the brain, spinal cord, and nerves). This is the system that senses the need to urinate.  A bladder test to check whether it is emptying completely when you urinate.  Cystoscopy. This test uses a thin tube with a tiny camera on it. It offers a look inside your urethra and bladder to see if there are problems.  Imaging tests. You might be given a contrast dye and then asked to urinate. X-rays are taken to see how your bladder is working. TREATMENT  It is important for you to be evaluated to determine if the amount or frequency that you have is unusual or abnormal. If it is found to be abnormal, the cause should be determined and this can usually be found out easily. Depending upon the cause, treatment could include medication, stimulation of the nerves, or surgery. There are not too many things that you can do as an individual to change your urinary frequency. It is important that you balance the amount of fluid intake needed to compensate for your activity and the temperature. Medical problems will be diagnosed and taken care of by your physician. There is no particular bladder training such as Kegel exercises that you can do to help urinary frequency. This is an exercise that is usually recommended for people who have leaking of urine when they laugh, cough, or sneeze. HOME CARE INSTRUCTIONS   Take any medications your health care provider prescribed or suggested. Follow the  directions carefully.  Practice any lifestyle changes that are recommended. These might include:  Drinking less fluid or drinking at different times of the day. If you need to urinate often during the night, for example, you may need to stop drinking fluids early in the evening.  Cutting down on caffeine or alcohol. They both can make you need to urinate more often than normal. Caffeine is found in coffee, tea, and sodas.  Losing weight, if that is recommended.  Keep a journal or a log. You might be asked to record how much you drink and when and where you feel the need to urinate. This will also help evaluate how well the treatment provided by your physician is working. SEEK MEDICAL CARE IF:   Your need to urinate often gets worse.  You feel increased pain or irritation when you urinate.  You notice blood in your urine.  You have questions about any medications that your health care provider recommended.  You notice blood, pus,  or swelling at the site of any test or treatment procedure.  You develop a fever of more than 100.1F (38.1C). SEEK IMMEDIATE MEDICAL CARE IF:  You develop a fever of more than 102.56F (38.9C).   This information is not intended to replace advice given to you by your health care provider. Make sure you discuss any questions you have with your health care provider.   Document Released: 03/29/2009 Document Revised: 06/23/2014 Document Reviewed: 03/29/2009 Elsevier Interactive Patient Education [employer].

## 2015-04-03 DIAGNOSIS — Z23 Encounter for immunization: Secondary | ICD-10-CM | POA: Diagnosis not present

## 2015-04-03 LAB — PSA, MEDICARE: PSA: 5.07 ng/mL — ABNORMAL HIGH (ref <=4.00)

## 2015-04-10 ENCOUNTER — Telehealth: Payer: Self-pay | Admitting: Family Medicine

## 2015-04-10 NOTE — Telephone Encounter (Signed)
LEFT A MESSAGE FOR PATIENT TO RETURN CALL NEED TO FIND OUT WHAT DATE HE HAD HIS COLONOSCOPY ON AND HAVE HIM SEND [place] [facility] AND ALSO FIND OUT WHEN AND WHERE HIS LAST EYE EXAM WAS?

## 2015-04-11 ENCOUNTER — Ambulatory Visit: Payer: Medicare Other

## 2015-04-16 ENCOUNTER — Ambulatory Visit: Payer: Medicare Other

## 2015-04-17 LAB — HM DIABETES EYE EXAM

## 2015-04-23 ENCOUNTER — Telehealth: Payer: Self-pay | Admitting: Family Medicine

## 2015-04-23 NOTE — Telephone Encounter (Signed)
PATIENTS WIFE RETURNED CALL AND SHE IS GOING TO CALL [facility] AND HAVE THEM FAX OVER THE LAST COLONOSCOPY REPORT AND ALSO HAVE THEM SEND THE EYE EXAM HE [facility].

## 2015-05-16 ENCOUNTER — Ambulatory Visit: Payer: Medicare Other

## 2015-05-18 ENCOUNTER — Ambulatory Visit (INDEPENDENT_AMBULATORY_CARE_PROVIDER_SITE_OTHER): Payer: Self-pay | Admitting: Family Medicine

## 2015-05-18 VITALS — BP 168/94 | HR 80 | Temp 98.5°F | Resp 17 | Ht 70.5 in | Wt 226.0 lb

## 2015-05-18 DIAGNOSIS — Z0000 Encounter for general adult medical examination without abnormal findings: Secondary | ICD-10-CM

## 2015-05-18 DIAGNOSIS — Z021 Encounter for pre-employment examination: Secondary | ICD-10-CM

## 2015-05-18 NOTE — Patient Instructions (Signed)
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  Your last test was in 2012.  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 I will send an email to Dr. Ellyn Hack about this- please make an appointment soon and have Dr. Ellyn Hack send me the report.  When I get this in I can extend your card if your BP is under 140/90  Also you did have a trace of blood in your urine- I would recommend that you see your urologist to have this evaluated.    Take care!

## 2015-05-18 NOTE — Progress Notes (Signed)
[facility] [address] [place] [address] [phone]  Date:  05/18/2015   Name:  Carl Gutierrez   DOB:  03/17/1946   MRN:  [id number]  PCP:  Kennon Portela, MD    Chief Complaint: Employment Physical   History of Present Illness:  Carl Gutierrez is a 69 y.o. very pleasant male patient who presents with the following:  Here today for a DOT exam.    History of brain tumor- this was noted about 10 years ago,  It was removed via operation.  It was benign.  He does not have any sx from this currently, he never had any seizures.    He notes that his eyes "run water" some of the time.  He feels that he is [facility] but does not use hearing aids  He had heart stents about 12 years ago- he is not sure if he had an MI or not at that time.   His cardiologist is Dr. Ellyn Hack. Most recent note in system is from this past January- per notes he had 2 vessel angioplasty in 2004 He does have HTN and high cholesterol  History of kidney stones about 8- 10 years ago He does have DM and is on metformin.  Last A1c showed good control  Lab Results  Component Value Date   HGBA1C 5.8 02/16/2015   He did have back surgery about 8 years ago- they did an operation for "something like a bone spur."  He had a blood clot related to surgery back in 2004.  He had a cath in 2008, last stress in 2012.    Patient Active Problem List   Diagnosis Date Noted  . Obesity (BMI 30-39.9) 06/26/2013  . Essential hypertension 06/26/2013  . Panhypopituitarism ([place]) 11/17/2011  .   11/17/2011  . Diabetes mellitus ([place]) 11/17/2011  . Hyperlipidemia LDL goal <70 11/17/2011  . CAD S/P percutaneous coronary angioplasty 07/19/2002    Past Medical History  Diagnosis Date  . Panhypopituitarism ([place])     Status post pituitary adenoma removal in 2012  . Hyperlipidemia with target LDL less than 70   . Essential hypertension   . Diabetes mellitus     Low-dose metformin  . CAD S/P percutaneous  coronary angioplasty February 2004; 2008    PCI-dLAD: 2.25 mm x 16 mm Express BMS; Ramus- 2.75 mm x 18 mm Cypher DES (2.8 mm); follow-up 11/'08: Patent LAD/ramus stents. 90% small OM. EF 50-60%.; Myoview 1/'12: Exercise 9 min, 10 METS with no ischemia/infarction.   . Hypothyroidism     Synthroid  . History of DVT of lower extremity      postoperative  . DJD (degenerative joint disease), lumbosacral      status post L4-L5 surgery    Past Surgical History  Procedure Laterality Date  . Brain surgery  2012    Removal of pituitary adenoma  . Spine surgery      L4-L5  . Coronary angioplasty with stent placement  February 2004    PCI-dLAD: 2.25 mm x 16 mm BMS;;PCI- RI: 2.75 mm 18 mm Cypher DES  . Cardiac catheterization  November 2008    Significant LAD tapering but no significant disease the distal stent. Patent Ramus stent. 90% lesion in small OM 2; small nondominant RCA. Medical therapy. EF 50-60%.  . Nm myoview ltd  January 2012    Exercise ~9 min - 10 METS; no ischemia or infarction    Social History  Substance Use Topics  .  Smoking status: Former Smoker    Quit date: 06/24/1993  . Smokeless tobacco: Never Used  . Alcohol Use: No    No family history on file.  Allergies  Allergen Reactions  . Hydrochlorothiazide     Leg and side pain with this  . Other     Blood pressure med name unknown.    Medication list has been reviewed and updated.  Current Outpatient Prescriptions on File Prior to Visit  Medication Sig Dispense Refill  . amLODipine (NORVASC) 10 MG tablet TAKE 1 TABLET (10 MG TOTAL) BY MOUTH DAILY. 90 tablet 1  . aspirin 81 MG tablet Take 81 mg by mouth daily.    . hydrocortisone (CORTEF) 10 MG tablet Take 10 mg by mouth daily. In afternoon    . hydrocortisone (CORTEF) 20 MG tablet Take 20 mg by mouth every morning.     Marland Kitchen levothyroxine (SYNTHROID, LEVOTHROID) 88 MCG tablet TAKE 1 TABLET (88 MCG TOTAL) BY MOUTH DAILY. 90 tablet 1  . lisinopril (PRINIVIL,ZESTRIL)  20 MG tablet TAKE 1 TABLET (20 MG TOTAL) BY MOUTH DAILY. 90 tablet 0  . metFORMIN (GLUCOPHAGE) 500 MG tablet TAKE 3 TABLETS WITH EVENING MEAL DAILY 270 tablet 1  . simvastatin (ZOCOR) 40 MG tablet Take 20 mg by mouth every evening.     . [DISCONTINUED] testosterone cypionate (DEPO-TESTOSTERONE) 200 MG/ML injection Inject 1.5 mLs (300 mg total) into the muscle every 21 ( twenty-one) days. 10 mL 3   Current Facility-Administered Medications on File Prior to Visit  Medication Dose Route Frequency Provider Last Rate Last Dose  . testosterone cypionate (DEPOTESTOTERONE CYPIONATE) injection 300 mg  300 mg Intramuscular Once Elayne Snare, MD      . testosterone cypionate (DEPOTESTOTERONE CYPIONATE) injection 350 mg  350 mg Intramuscular Q21 days Elayne Snare, MD   350 mg at 10/18/14 1538    Review of Systems:  As per HPI- otherwise negative.   Physical Examination: Filed Vitals:   05/18/15 0841  BP: 161/93  Pulse: 80  Temp: 98.5 F (36.9 C)  Resp: 17   Filed Vitals:   05/18/15 0841  Height: 5' 10.5" (1.791 m)  Weight: 226 lb (102.513 kg)   Body mass index is 31.96 kg/(m^2). Ideal Body Weight: Weight in (lb) to have BMI = 25: 176.4  GEN: WDWN, NAD, Non-toxic, A & O x 3, overweight/ large build.  Looks well HEENT: Atraumatic, Normocephalic. Neck supple. No masses, No LAD.  Bilateral TM wnl, oropharynx normal.  PEERL,EOMI.   Ears and Nose: No external deformity. CV: RRR, No M/G/R. No JVD. No thrill. No extra heart sounds. PULM: CTA B, no wheezes, crackles, rhonchi. No retractions. No resp. distress. No accessory muscle use. ABD: S, NT, ND EXTR: No c/c/e NEURO Normal gait.  PSYCH: Normally interactive. Conversant. Not depressed or anxious appearing.  Calm demeanor.  No inguinal hernia, normal strength and DTR of all extremities  Assessment and Plan: Physical exam  DOT exam today.   His BP is too high, and he is overdue for a stress test per DOT regulations.  Gave a 3 month card today  and asked him to please see his cardiologist asap. If BP and stress test issues are resolved can issue a 1 year card He has trace hematuria today but already has a urology appt pending   Signed Lamar Blinks, MD

## 2015-05-22 DIAGNOSIS — R3129 Other microscopic hematuria: Secondary | ICD-10-CM | POA: Diagnosis not present

## 2015-05-22 DIAGNOSIS — R972 Elevated prostate specific antigen [PSA]: Secondary | ICD-10-CM | POA: Diagnosis not present

## 2015-05-22 DIAGNOSIS — N401 Enlarged prostate with lower urinary tract symptoms: Secondary | ICD-10-CM | POA: Diagnosis not present

## 2015-05-22 DIAGNOSIS — R351 Nocturia: Secondary | ICD-10-CM | POA: Diagnosis not present

## 2015-05-24 ENCOUNTER — Ambulatory Visit: Payer: Medicare Other

## 2015-05-29 ENCOUNTER — Ambulatory Visit: Payer: Medicare Other

## 2015-06-05 ENCOUNTER — Encounter: Payer: Self-pay | Admitting: Family Medicine

## 2015-06-08 DIAGNOSIS — R3129 Other microscopic hematuria: Secondary | ICD-10-CM | POA: Diagnosis not present

## 2015-06-14 ENCOUNTER — Ambulatory Visit (INDEPENDENT_AMBULATORY_CARE_PROVIDER_SITE_OTHER): Payer: Medicare Other | Admitting: *Deleted

## 2015-06-14 DIAGNOSIS — E230 Hypopituitarism: Secondary | ICD-10-CM

## 2015-06-14 MED ORDER — TESTOSTERONE CYPIONATE 100 MG/ML IM SOLN
150.0000 mg | Freq: Once | INTRAMUSCULAR | Status: AC
  Administered 2015-06-14: 150 mg via INTRAMUSCULAR

## 2015-07-06 DIAGNOSIS — R351 Nocturia: Secondary | ICD-10-CM | POA: Diagnosis not present

## 2015-07-06 DIAGNOSIS — R3129 Other microscopic hematuria: Secondary | ICD-10-CM | POA: Diagnosis not present

## 2015-07-06 DIAGNOSIS — N401 Enlarged prostate with lower urinary tract symptoms: Secondary | ICD-10-CM | POA: Diagnosis not present

## 2015-07-06 DIAGNOSIS — Z0000 Encounter for general adult medical examination without abnormal findings: Secondary | ICD-10-CM | POA: Diagnosis not present

## 2015-07-10 ENCOUNTER — Other Ambulatory Visit: Payer: Self-pay | Admitting: Urology

## 2015-07-11 ENCOUNTER — Ambulatory Visit (INDEPENDENT_AMBULATORY_CARE_PROVIDER_SITE_OTHER): Payer: Medicare Other | Admitting: *Deleted

## 2015-07-11 DIAGNOSIS — E230 Hypopituitarism: Secondary | ICD-10-CM | POA: Diagnosis not present

## 2015-07-11 MED ORDER — TESTOSTERONE CYPIONATE 100 MG/ML IM SOLN
150.0000 mg | Freq: Once | INTRAMUSCULAR | Status: AC
  Administered 2015-07-11: 150 mg via INTRAMUSCULAR

## 2015-07-12 ENCOUNTER — Ambulatory Visit (INDEPENDENT_AMBULATORY_CARE_PROVIDER_SITE_OTHER): Payer: Medicare Other | Admitting: Family Medicine

## 2015-07-12 VITALS — BP 115/72 | HR 70 | Temp 97.9°F | Resp 18 | Ht 71.0 in | Wt 223.8 lb

## 2015-07-12 DIAGNOSIS — E119 Type 2 diabetes mellitus without complications: Secondary | ICD-10-CM

## 2015-07-12 DIAGNOSIS — I10 Essential (primary) hypertension: Secondary | ICD-10-CM | POA: Diagnosis not present

## 2015-07-12 DIAGNOSIS — E785 Hyperlipidemia, unspecified: Secondary | ICD-10-CM | POA: Diagnosis not present

## 2015-07-12 LAB — GLUCOSE, POCT (MANUAL RESULT ENTRY): POC Glucose: 88 mg/dl (ref 70–99)

## 2015-07-12 LAB — POCT GLYCOSYLATED HEMOGLOBIN (HGB A1C): Hemoglobin A1C: 6.3

## 2015-07-12 MED ORDER — METOPROLOL SUCCINATE ER 25 MG PO TB24
25.0000 mg | ORAL_TABLET | Freq: Every day | ORAL | Status: DC
Start: 2015-07-12 — End: 2016-01-05

## 2015-07-12 NOTE — Progress Notes (Addendum)
Subjective:  By signing my name below, I, Rawaa Al Rifaie, attest that this documentation has been prepared under the direction and in the presence of [name], MD.  Leandra Kern, Medical Scribe. 07/12/2015.  2:03 PM.  I personally performed the services described in this documentation, which was scribed in my presence. The recorded information has been reviewed and considered, and addended by me as needed.     Patient ID: Carl Gutierrez, male    DOB: 1946/04/04, 70 y.o.   MRN: [id number]  Chief Complaint  Patient presents with  . Other    BP Meds.    HPI HPI Comments: Carl Gutierrez is a 70 y.o. male who [facility] for a follow up of HTN.  Pt's last visit for a DOT physical was in 05/2015 with Dr. Lorelei Pont.   HTN: Blood pressure at his last visit was 168/94. Pt was advised by Dr. Lorelei Pont to have a stress test done. Pt has a hx of PTCA, followed by Dr. Ellyn Hack. Pt has a history of cardiac disease, Carl Gutierrez had a Cath in 2008. Pt last had a stress test done was through [facility] in 2012 10 mets, low risk scan. Lab Results  Component Value Date   CREATININE 1.25 02/16/2015  Pt reports that Carl Gutierrez last followed up with a cardiologist was last year. Pt notes that his home blood pressure readings were ranging 142-154/ 70-86. Carl Gutierrez indicates that Carl Gutierrez is compliant with his amlodipine 10 mg, and lisinopril 20 mg, Carl Gutierrez does not miss any dosages. Pt states that Carl Gutierrez was on metrplol that was prescribed by Dr. Elder Cyphers previously, and reports that it was effective in lowering his blood pressure, and that is why Carl Gutierrez was taken off of it. Pt experienced muscle cramps with HCTZ.  HLD: Lab Results  Component Value Date   CHOL 148 01/22/2015   HDL 45 01/22/2015   LDLCALC 81 01/22/2015   LDLDIRECT 85.0 05/18/2014   TRIG 108 01/22/2015   CHOLHDL 3.3 01/22/2015   Lab Results  Component Value Date   ALT 20 02/16/2015   AST 15 02/16/2015   ALKPHOS 61 02/16/2015   BILITOT 0.3 02/16/2015   Pt is compliant with taking his cholesterol mediation.   Diabetes: Lab Results  Component Value Date   HGBA1C 5.8 02/16/2015   Lab Results  Component Value Date   MICROALBUR 2.9* 05/18/2014  Pt states that Carl Gutierrez checks his blood sugars intermittently. Carl Gutierrez denies experiencing new side effects with is medications.    Patient Active Problem List   Diagnosis Date Noted  . Obesity (BMI 30-39.9) 06/26/2013  . Essential hypertension 06/26/2013  . Panhypopituitarism ([place]) 11/17/2011  .   11/17/2011  . Diabetes mellitus ([place]) 11/17/2011  . Hyperlipidemia LDL goal <70 11/17/2011  . CAD S/P percutaneous coronary angioplasty 07/19/2002   Past Medical History  Diagnosis Date  . Panhypopituitarism ([place])     Status post pituitary adenoma removal in 2012  . Hyperlipidemia with target LDL less than 70   . Essential hypertension   . Diabetes mellitus     Low-dose metformin  . CAD S/P percutaneous coronary angioplasty February 2004; 2008    PCI-dLAD: 2.25 mm x 16 mm Express BMS; Ramus- 2.75 mm x 18 mm Cypher DES (2.8 mm); follow-up 11/'08: Patent LAD/ramus stents. 90% small OM. EF 50-60%.; Myoview 1/'12: Exercise 9 min, 10 METS with no ischemia/infarction.   . Hypothyroidism     Synthroid  . History of DVT of lower extremity  postoperative  . DJD (degenerative joint disease), lumbosacral      status post L4-L5 surgery   Past Surgical History  Procedure Laterality Date  . Brain surgery  2012    Removal of pituitary adenoma  . Spine surgery      L4-L5  . Coronary angioplasty with stent placement  February 2004    PCI-dLAD: 2.25 mm x 16 mm BMS;;PCI- RI: 2.75 mm 18 mm Cypher DES  . Cardiac catheterization  November 2008    Significant LAD tapering but no significant disease the distal stent. Patent Ramus stent. 90% lesion in small OM 2; small nondominant RCA. Medical therapy. EF 50-60%.  . Nm myoview ltd  January 2012    Exercise ~9 min - 10 METS; no ischemia or infarction   Allergies   Allergen Reactions  . Hydrochlorothiazide     Leg and side pain with this  . Other     Blood pressure med name unknown.   Prior to Admission medications   Medication Sig Start Date End Date Taking? Authorizing Provider  amLODipine (NORVASC) 10 MG tablet TAKE 1 TABLET (10 MG TOTAL) BY MOUTH DAILY. 03/09/15  Yes Wendie Agreste, MD  aspirin 81 MG tablet Take 81 mg by mouth daily.   Yes Historical Provider, MD  Cholecalciferol (VITAMIN D3) 2000 units TABS Take 2,000 Units by mouth.   Yes Historical Provider, MD  hydrocortisone (CORTEF) 10 MG tablet Take 10 mg by mouth daily. In afternoon   Yes Historical Provider, MD  hydrocortisone (CORTEF) 20 MG tablet Take 20 mg by mouth every morning.    Yes Historical Provider, MD  levothyroxine (SYNTHROID, LEVOTHROID) 88 MCG tablet TAKE 1 TABLET (88 MCG TOTAL) BY MOUTH DAILY. 02/27/15  Yes Elayne Snare, MD  lisinopril (PRINIVIL,ZESTRIL) 20 MG tablet TAKE 1 TABLET (20 MG TOTAL) BY MOUTH DAILY. 04/20/13  Yes Orma Flaming, MD  metFORMIN (GLUCOPHAGE) 500 MG tablet TAKE 3 TABLETS WITH EVENING MEAL DAILY 02/27/15  Yes Elayne Snare, MD  simvastatin (ZOCOR) 40 MG tablet Take 20 mg by mouth every evening.    Yes Historical Provider, MD   Social History   Social History  . Marital Status: Married    Spouse Name: N/A  . Number of Children: N/A  . Years of Education: N/A   Occupational History  . Not on file.   Social History Main Topics  . Smoking status: Former Smoker    Quit date: 06/24/1993  . Smokeless tobacco: Never Used  . Alcohol Use: No  . Drug Use: No  . Sexual Activity: Not on file   Other Topics Concern  . Not on file   Social History Narrative   Carl Gutierrez is a married father of 2, grandfather 73. Exercises only occasionally. Not as much as Carl Gutierrez desires 2. 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   Carl Gutierrez does not smoke cigarettes -- quit in 1995. Does not drink alcohol.    Review of Systems  Constitutional: Negative for fatigue and unexpected weight  change.  Eyes: Negative for visual disturbance.  Respiratory: Negative for cough, chest tightness and shortness of breath.   Cardiovascular: Negative for chest pain, palpitations and leg swelling.  Gastrointestinal: Negative for abdominal pain and blood in stool.  Neurological: Negative for dizziness, light-headedness and headaches.      Objective:   Physical Exam  Constitutional: Carl Gutierrez is oriented to person, place, and time. Carl Gutierrez appears well-developed and well-nourished. No distress.  HENT:  Head: Normocephalic and atraumatic.  Eyes: EOM  are normal. Pupils are equal, round, and reactive to light.  Neck: Neck supple. No JVD present. Carotid bruit is not present.  Cardiovascular: Normal rate, regular rhythm and normal heart sounds.   No murmur heard. Pulmonary/Chest: Effort normal and breath sounds normal. Carl Gutierrez has no rales.  Musculoskeletal: Carl Gutierrez exhibits no edema.  Neurological: Carl Gutierrez is alert and oriented to person, place, and time. No cranial nerve deficit.  Skin: Skin is warm and dry.  Psychiatric: Carl Gutierrez has a normal mood and affect. His behavior is normal.  Nursing note and vitals reviewed.   BP 115/72 mmHg  Pulse 70  Temp(Src) 97.9 F (36.6 C) (Oral)  Resp 18  Ht 5\' 11"  (1.803 m)  Wt 223 lb 12.8 oz (101.515 kg)  BMI 31.23 kg/m2  SpO2 98%   Results for orders placed or performed in visit on 07/12/15  POCT glycosylated hemoglobin (Hb A1C)  Result Value Ref Range   Hemoglobin A1C 6.3   POCT glucose (manual entry)  Result Value Ref Range   POC Glucose 88 70 - 99 mg/dl        Assessment & Plan:   Carl Gutierrez is a 70 y.o. male Controlled type 2 diabetes mellitus without complication, without long-term current use of insulin ([place]) - Plan: Microalbumin, urine, POCT glycosylated hemoglobin (Hb A1C), POCT glucose (manual entry)  - controlled. No med changes, and should follow up as planned with endocrinologist.   Essential hypertension - Plan: metoprolol succinate (TOPROL-XL) 25 MG  24 hr tablet  - restart Toprol low dose. Side effects discussed and orthostatic precautions. Follow-up with cardiologist to discuss stress testing as this may be needed for his DOT exam. Also discuss his blood pressure medications with cardiology.  Hyperlipidemia  Continue Zocor 40 mg daily. Controlled on August reading.   . metoprolol succinate (TOPROL-XL) 25 MG 24 hr tablet    Sig: Take 1 tablet (25 mg total) by mouth daily. Start with one half pill per day for 1 week, then can increase to one pill per day.    Dispense:  90 tablet    Refill:  1   Patient Instructions  Call your cardiologist for follow up and stress testing for DOT. Can restart metoprolol, but watch for low blood pressure symptoms such as lightheadedness with standing. Discuss blood pressure medicine with your cardiologist as well.   Diabetes test stable. Continue follow up with Dr. Dwyane Dee.   Return in 6 months, let me know if refills needed before then.

## 2015-07-12 NOTE — Patient Instructions (Addendum)
Call your cardiologist for follow up and stress testing for DOT. Can restart metoprolol, but watch for low blood pressure symptoms such as lightheadedness with standing. Discuss blood pressure medicine with your cardiologist as well.   Diabetes test stable. Continue follow up with Dr. Dwyane Dee.   Return in 6 months, let me know if refills needed before then.

## 2015-07-13 ENCOUNTER — Telehealth: Payer: Self-pay | Admitting: Cardiology

## 2015-07-13 LAB — MICROALBUMIN, URINE: Microalb, Ur: 0.8 mg/dL

## 2015-07-13 NOTE — Telephone Encounter (Signed)
New Message  Pt wife calling to speak w/ Rn concerning possible upcoming surgery w/ Dr Gaynelle Arabian and possible Stress test. Please call back and discuss.

## 2015-07-13 NOTE — Telephone Encounter (Signed)
Spoke with pt wife, pt has not been seen in one year. Follow up scheduled for surgical clearance

## 2015-07-16 ENCOUNTER — Encounter: Payer: Self-pay | Admitting: *Deleted

## 2015-07-17 ENCOUNTER — Ambulatory Visit (INDEPENDENT_AMBULATORY_CARE_PROVIDER_SITE_OTHER): Payer: Medicare Other | Admitting: Cardiology

## 2015-07-17 ENCOUNTER — Encounter: Payer: Self-pay | Admitting: Cardiology

## 2015-07-17 VITALS — BP 130/86 | HR 58 | Ht 70.5 in | Wt 219.6 lb

## 2015-07-17 DIAGNOSIS — Z021 Encounter for pre-employment examination: Secondary | ICD-10-CM

## 2015-07-17 DIAGNOSIS — Z01810 Encounter for preprocedural cardiovascular examination: Secondary | ICD-10-CM

## 2015-07-17 DIAGNOSIS — I10 Essential (primary) hypertension: Secondary | ICD-10-CM

## 2015-07-17 DIAGNOSIS — Z024 Encounter for examination for driving license: Secondary | ICD-10-CM | POA: Insufficient documentation

## 2015-07-17 DIAGNOSIS — Z9861 Coronary angioplasty status: Secondary | ICD-10-CM

## 2015-07-17 DIAGNOSIS — E669 Obesity, unspecified: Secondary | ICD-10-CM | POA: Diagnosis not present

## 2015-07-17 DIAGNOSIS — E785 Hyperlipidemia, unspecified: Secondary | ICD-10-CM

## 2015-07-17 DIAGNOSIS — I2510 Atherosclerotic heart disease of native coronary artery without angina pectoris: Secondary | ICD-10-CM | POA: Diagnosis not present

## 2015-07-17 NOTE — Assessment & Plan Note (Signed)
Most recent check from his PCP shows LDL calculated 81 which is definitely down from before. He is due for a check soon. As far as considering holding statin for a month to see this is partly related to his leg cramping, I would like to get the repeat labs. Then he can stop the simvastatin for a month to see if any symptoms improved.

## 2015-07-17 NOTE — Progress Notes (Signed)
PCP: Kennon Portela, MD  Clinic Note: Chief Complaint  Patient presents with  . Annual Exam    no chest pain , no sob, no swelling , STARTED ON METOPROLOL LAST WEEK  . Pre-op Exam    surgery on prostrate - by Dr Gaynelle Arabian 08/13/15    HPI: Carl Gutierrez is a 70 y.o. male with a PMH below who presents today for annual follow-up of CAD-PCI and cardiac risk factors. He also carries a diagnosis of panhypopituitarism.  Carl Gutierrez was last seen in January 2016. He is doing relatively well with no major complaints. Little bit of exertional dyspnea if he really pushes it.  He has not been on beta blocker due to history of bradycardia. His ambulation was limited more by knee pain  Recent Hospitalizations: none  Studies Reviewed: none  Interval History: Carl Gutierrez is doing quite well without any major complaints. His biggest issue has been bilateral knee pain with some thigh pain. This is limited his ability to walk for exercise. He is still active, and tries to walk as much the can. Overall he says his blood sugars have been well controlled, as have his lipids - he is due to have labs drawn soon by his PCP. I think for hypertension, he was started on beta blocker by his PCP recently. From a cardiovascular symptom standpoint, he has been relatively stable. Review of symptoms as follows:  No chest pain or shortness of breath with rest or exertion.  No PND, orthopnea or edema. No palpitations, lightheadedness, dizziness, weakness or syncope/near syncope. No TIA/amaurosis fugax symptoms. No claudication.  He also has a pending prostate evaluation with potential surgery on February 27.  ROS: A comprehensive was performed. Review of Systems  Constitutional: Positive for weight loss (Intentional). Negative for diaphoresis.  Respiratory: Negative for cough and wheezing.   Cardiovascular: Negative for chest pain and claudication.  Gastrointestinal: Negative for nausea, vomiting and blood in  stool.  Genitourinary: Negative for dysuria, hematuria and flank pain.  Musculoskeletal: Positive for myalgias (occasionally in the thighs, probably related to the knees.) and joint pain (mostly knees).  Neurological: Negative for dizziness, focal weakness, seizures, loss of consciousness and weakness.  Endo/Heme/Allergies: Does not bruise/bleed easily.  Psychiatric/Behavioral: Negative for depression.  All other systems reviewed and are negative.   Past Medical History  Diagnosis Date  . Panhypopituitarism ([place])     Status post pituitary adenoma removal in 2012  . Hyperlipidemia with target LDL less than 70   . Essential hypertension   . Diabetes mellitus     Low-dose metformin  . CAD S/P percutaneous coronary angioplasty February 2004; 2008    PCI-dLAD: 2.25 mm x 16 mm Express BMS; Ramus- 2.75 mm x 18 mm Cypher DES (2.8 mm); follow-up 11/'08: Patent LAD/ramus stents. 90% small OM. EF 50-60%.; Myoview 1/'12: Exercise 9 min, 10 METS with no ischemia/infarction.   . Hypothyroidism     Synthroid  . History of DVT of lower extremity      postoperative  . DJD (degenerative joint disease), lumbosacral      status post L4-L5 surgery    Past Surgical History  Procedure Laterality Date  . Brain surgery  2012    Removal of pituitary adenoma  . Spine surgery      L4-L5  . Coronary angioplasty with stent placement  February 2004    PCI-dLAD: 2.25 mm x 16 mm BMS;;PCI- RI: 2.75 mm 18 mm Cypher DES  . Cardiac catheterization  November 2008  Significant LAD tapering but no significant disease the distal stent. Patent Ramus stent. 90% lesion in small OM 2; small nondominant RCA. Medical therapy. EF 50-60%.  . Nm myoview ltd  January 2012    Exercise ~9 min - 10 METS; no ischemia or infarction   Prior to Admission medications   Medication Sig Start Date End Date Taking? Authorizing Provider  amLODipine (NORVASC) 10 MG tablet TAKE 1 TABLET (10 MG TOTAL) BY MOUTH DAILY. 03/09/15  Yes Wendie Agreste, MD  aspirin 81 MG tablet Take 81 mg by mouth daily.   Yes Historical Provider, MD  Cholecalciferol (VITAMIN D3) 2000 units TABS Take 2,000 Units by mouth.   Yes Historical Provider, MD  hydrocortisone (CORTEF) 10 MG tablet Take 10 mg by mouth daily. In afternoon   Yes Historical Provider, MD  hydrocortisone (CORTEF) 20 MG tablet Take 20 mg by mouth every morning.    Yes Historical Provider, MD  levothyroxine (SYNTHROID, LEVOTHROID) 88 MCG tablet TAKE 1 TABLET (88 MCG TOTAL) BY MOUTH DAILY. 02/27/15  Yes Elayne Snare, MD  lisinopril (PRINIVIL,ZESTRIL) 20 MG tablet TAKE 1 TABLET (20 MG TOTAL) BY MOUTH DAILY. 04/20/13  Yes Orma Flaming, MD  metFORMIN (GLUCOPHAGE) 500 MG tablet TAKE 3 TABLETS WITH EVENING MEAL DAILY 02/27/15  Yes Elayne Snare, MD  metoprolol succinate (TOPROL-XL) 25 MG 24 hr tablet Take 1 tablet (25 mg total) by mouth daily. Start with one half pill per day for 1 week, then can increase to one pill per day. 07/12/15  Yes Wendie Agreste, MD  simvastatin (ZOCOR) 40 MG tablet Take 20 mg by mouth every evening.    Yes Historical Provider, MD    Allergies  Allergen Reactions  . Hydrochlorothiazide     Leg and side pain with this  . Other     Blood pressure med name unknown.    Social History   Social History  . Marital Status: Married    Spouse Name: N/A  . Number of Children: N/A  . Years of Education: N/A   Social History Main Topics  . Smoking status: Former Smoker    Quit date: 06/24/1993  . Smokeless tobacco: Never Used  . Alcohol Use: No  . Drug Use: No  . Sexual Activity: Not Asked   Other Topics Concern  . None   Social History Narrative   He is a married father of 2, grandfather 58. Exercises only occasionally. Not as much as he desires 2. 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   He does not smoke cigarettes -- quit in 1995. Does not drink alcohol.    History reviewed. No pertinent family history.  Wt Readings from Last 3 Encounters:  07/17/15 219 lb  9.6 oz (99.61 kg)  07/12/15 223 lb 12.8 oz (101.515 kg)  05/18/15 226 lb (102.513 kg)    PHYSICAL EXAM BP 130/86 mmHg  Pulse 58  Ht 5' 10.5" (1.791 m)  Wt 219 lb 9.6 oz (99.61 kg)  BMI 31.05 kg/m2 Neck: no adenopathy, no carotid bruit, no JVD, supple, symmetrical, trachea midline and thyroid not enlarged, symmetric, no tenderness/mass/nodules HEENT: [place]/AT, EOMI, MMM, anicteric sclera Lungs: clear to auscultation bilaterally, normal percussion bilaterally and Nonlabored, good air movement Heart: regular rate and rhythm, S1, S2 normal, no murmur, click, rub or gallop and normal apical impulse Abdomen: soft, non-tender; bowel sounds normal; no masses, no organomegaly Extremities: extremities normal, atraumatic, no cyanosis or edema Pulses: 2+ and symmetric Neurologic: Alert and oriented X 3,  normal strength and tone. Normal symmetric reflexes. Normal coordination and gait   Adult ECG Report  Rate: 58 ;  Rhythm: sinus bradycardia and With 1 A-VB (202);   Narrative Interpretation: Stable, otherwise normal EKG with sinus bradycardia and first-degree AV block.   Other studies Reviewed: Additional studies/ records that were reviewed today include:  Recent Labs:   Lab Results  Component Value Date   CHOL 148 01/22/2015   HDL 45 01/22/2015   LDLCALC 81 01/22/2015   LDLDIRECT 85.0 05/18/2014   TRIG 108 01/22/2015   CHOLHDL 3.3 01/22/2015    ASSESSMENT / PLAN: Problem List Items Addressed This Visit    Preop cardiovascular exam   Obesity (BMI 30-39.9) (Chronic)    The patient understands the need to lose weight with diet and exercise. We have discussed specific strategies for this. He is actually doing pretty well, just limited because the knee pain.      Relevant Orders   EKG 12-Lead   Myocardial Perfusion Imaging   Hyperlipidemia LDL goal <70 - Primary (Chronic)    Most recent check from his PCP shows LDL calculated 81 which is definitely down from before. He is due for a  check soon. As far as considering holding statin for a month to see this is partly related to his leg cramping, I would like to get the repeat labs. Then he can stop the simvastatin for a month to see if any symptoms improved.      Relevant Orders   EKG 12-Lead   Myocardial Perfusion Imaging   Essential hypertension (Chronic)    Borderline control, but better than it has been. Would not increase the beta blocker beyond baseline dose without switching from metoprolol succinate to carvedilol. On max dose amlodipine and max effective dose of lisinopril. Next option will be HCTZ, but looks pretty good today.       Relevant Orders   EKG 12-Lead   Myocardial Perfusion Imaging   Encounter for CDL (commercial driving license) exam    I didn't know before he had to have [place] license evaluation. However he is instructed he needs it now. He is relatively symptomatically, therefore don't suspect that to be abnormal. Unfortunately, with his knee discomfort, I don't think he can walk on treadmill which would be the appropriate test. Therefore we will proceed with Lexiscan Myoview.       Relevant Orders   Myocardial Perfusion Imaging   CAD S/P percutaneous coronary angioplasty (Chronic)    No recurrent anginal symptoms with rest or exertion. He is on aspirin, statin, ACE inhibitor and beta blocker along with calcium channel blocker. Well-controlled symptoms. No suggestion of angina or heart failure.      Relevant Orders   EKG 12-Lead   Myocardial Perfusion Imaging      Current medicines are reviewed at length with the patient today. (+/- concerns) he asked about simvastatin and concern with knee pain The following changes have been made: I recommended that he have his lipids checked when scheduled, then try to have in 3 weeks off simvastatin to determine any potential myalgias. If he does continue to have the symptoms and is not related to statin. However if it is related to the statins, symptoms  relieved relatively quickly. After tasting good to consider a different option for treating dyslipidemia.  lexiscan Myoview. For further information please visit [email or web address]. Please follow instruction sheet, as given.  NO OTHER CHANGES TODAY.  AFTER YOUR PRIMARY CHECKS YOUR CHOLESTEROL ,AND  LEGS STILL  HURT YOU MAY TAKE HOLIDAY FROM CHOLESTEROL MEDICATION FOR 3-4 WEEKS, IF PAIN CONTINUES  RESTART MEDICATION.  Your physician wants you to follow-up in [place].  Studies Ordered:   Orders Placed This Encounter  Procedures  . Myocardial Perfusion Imaging  . EKG 12-Lead   rov 1 YR   Maureena Dabbs, Leonie Green, M.D., M.S. Interventional Cardiologist   Pager # [phone] Phone # [phone] [address] [place], [place] [address]

## 2015-07-17 NOTE — Assessment & Plan Note (Signed)
No recurrent anginal symptoms with rest or exertion. He is on aspirin, statin, ACE inhibitor and beta blocker along with calcium channel blocker. Well-controlled symptoms. No suggestion of angina or heart failure.

## 2015-07-17 NOTE — Assessment & Plan Note (Signed)
The patient understands the need to lose weight with diet and exercise. We have discussed specific strategies for this. He is actually doing pretty well, just limited because the knee pain.

## 2015-07-17 NOTE — Patient Instructions (Signed)
Your physician has requested that you have a lexiscan Myoview. For further information please visit [email or web address]. Please follow instruction sheet, as given.  NO OTHER CHANGES TODAY.  AFTER YOUR PRIMARY CHECKS YOUR CHOLESTEROL ,AND  LEGS STILL HURT YOU MAY TAKE HOLIDAY FROM CHOLESTEROL MEDICATION FOR 3-4 WEEKS, IF PAIN CONTINUES  RESTART MEDICATION.  Your physician wants you to follow-up in [place].  You will receive a reminder letter in the mail two months in advance. If you don't receive a letter, please call our office to schedule the follow-up appointment.  If you need a refill on your cardiac medications before your next appointment, please call your pharmacy.

## 2015-07-17 NOTE — Assessment & Plan Note (Signed)
Borderline control, but better than it has been. Would not increase the beta blocker beyond baseline dose without switching from metoprolol succinate to carvedilol. On max dose amlodipine and max effective dose of lisinopril. Next option will be HCTZ, but looks pretty good today.

## 2015-07-17 NOTE — Assessment & Plan Note (Signed)
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 However he is instructed he needs it now. He is relatively symptomatically, therefore don't suspect that to be abnormal. Unfortunately, with his knee discomfort, I don't think he can walk on treadmill which would be the appropriate test. Therefore we will proceed with Lexiscan Myoview.

## 2015-07-19 ENCOUNTER — Telehealth: Payer: Self-pay

## 2015-07-19 ENCOUNTER — Ambulatory Visit (HOSPITAL_COMMUNITY)
Admission: RE | Admit: 2015-07-19 | Discharge: 2015-07-19 | Disposition: A | Discharge status: Home / Self Care | Payer: Medicare Other | Source: Ambulatory Visit | Attending: Internal Medicine | Admitting: Internal Medicine

## 2015-07-19 DIAGNOSIS — E669 Obesity, unspecified: Secondary | ICD-10-CM | POA: Diagnosis not present

## 2015-07-19 DIAGNOSIS — E119 Type 2 diabetes mellitus without complications: Secondary | ICD-10-CM | POA: Insufficient documentation

## 2015-07-19 DIAGNOSIS — Z6831 Body mass index (BMI) 31.0-31.9, adult: Secondary | ICD-10-CM | POA: Insufficient documentation

## 2015-07-19 DIAGNOSIS — Z87891 Personal history of nicotine dependence: Secondary | ICD-10-CM | POA: Insufficient documentation

## 2015-07-19 DIAGNOSIS — E785 Hyperlipidemia, unspecified: Secondary | ICD-10-CM | POA: Diagnosis not present

## 2015-07-19 DIAGNOSIS — Z8249 Family history of ischemic heart disease and other diseases of the circulatory system: Secondary | ICD-10-CM | POA: Diagnosis not present

## 2015-07-19 DIAGNOSIS — Z9861 Coronary angioplasty status: Secondary | ICD-10-CM | POA: Diagnosis not present

## 2015-07-19 DIAGNOSIS — R9439 Abnormal result of other cardiovascular function study: Secondary | ICD-10-CM | POA: Diagnosis not present

## 2015-07-19 DIAGNOSIS — R0609 Other forms of dyspnea: Secondary | ICD-10-CM | POA: Diagnosis not present

## 2015-07-19 DIAGNOSIS — I10 Essential (primary) hypertension: Secondary | ICD-10-CM

## 2015-07-19 DIAGNOSIS — Z021 Encounter for pre-employment examination: Secondary | ICD-10-CM

## 2015-07-19 DIAGNOSIS — I2510 Atherosclerotic heart disease of native coronary artery without angina pectoris: Secondary | ICD-10-CM

## 2015-07-19 DIAGNOSIS — Z024 Encounter for examination for driving license: Secondary | ICD-10-CM

## 2015-07-19 LAB — MYOCARDIAL PERFUSION IMAGING
Estimated workload: 1 METS
LV dias vol: 115 mL
LV sys vol: 55 mL
Peak BP: 128 mmHg
Peak HR: 63 {beats}/min
Percent of predicted max HR: 42 %
Rest HR: 54 {beats}/min
SDS: 2
SRS: 0
SSS: 2
Stage 1 DBP: 77 mmHg
Stage 1 Grade: 0 %
Stage 1 HR: 55 {beats}/min
Stage 1 SBP: 154 mmHg
Stage 1 Speed: 0 mph
Stage 2 Grade: 0 %
Stage 2 HR: 55 {beats}/min
Stage 2 Speed: 0 mph
Stage 3 DBP: 88 mmHg
Stage 3 Grade: 0 %
Stage 3 HR: 63 {beats}/min
Stage 3 SBP: 128 mmHg
Stage 3 Speed: 0 mph
Stage 4 DBP: 76 mmHg
Stage 4 Grade: 0 %
Stage 4 HR: 74 {beats}/min
Stage 4 SBP: 127 mmHg
Stage 4 Speed: 0 mph
TID: 1.19

## 2015-07-19 MED ORDER — TECHNETIUM TC 99M SESTAMIBI GENERIC - CARDIOLITE
10.3000 | Freq: Once | INTRAVENOUS | Status: AC | PRN
  Administered 2015-07-19: 10.3 via INTRAVENOUS

## 2015-07-19 MED ORDER — TECHNETIUM TC 99M SESTAMIBI GENERIC - CARDIOLITE
31.5000 | Freq: Once | INTRAVENOUS | Status: AC | PRN
  Administered 2015-07-19: 31.5 via INTRAVENOUS

## 2015-07-19 MED ORDER — REGADENOSON 0.4 MG/5ML IV SOLN
0.4000 mg | Freq: Once | INTRAVENOUS | Status: AC
  Administered 2015-07-19: 0.4 mg via INTRAVENOUS

## 2015-07-19 NOTE — Telephone Encounter (Signed)
Needing A clearance from Dr. Carlota Raspberry - surgury scheduled Sent over on January 24 - feb   [phone] [facility]

## 2015-07-20 ENCOUNTER — Telehealth: Payer: Self-pay | Admitting: *Deleted

## 2015-07-20 NOTE — Telephone Encounter (Signed)
Left message to call back in regards to stress test

## 2015-07-20 NOTE — Progress Notes (Signed)
Quick Note:  The Stress Test was Read as LOW RISK.  Low Normal pump function with a perfusion defect that was considered consistent with diaphragmatic attenuation artifact.  Will need to review images in comparison to last study (which also had a similar finding.  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  Leonie Man, MD  ______

## 2015-07-20 NOTE — Telephone Encounter (Signed)
-----   Message from Leonie Man, MD sent at 07/20/2015  7:44 AM EST ----- The Stress Test was Read as LOW RISK.  Low Normal pump function with a perfusion defect that was considered consistent with diaphragmatic attenuation artifact.  Will need to review images in comparison to last study (which also had a similar finding.  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  Leonie Man, MD

## 2015-07-23 DIAGNOSIS — N138 Other obstructive and reflux uropathy: Secondary | ICD-10-CM | POA: Insufficient documentation

## 2015-07-23 DIAGNOSIS — N401 Enlarged prostate with lower urinary tract symptoms: Principal | ICD-10-CM

## 2015-07-23 NOTE — Telephone Encounter (Signed)
Form completed. Followed by Dr. Ellyn Hack for cardiology with hx of CAD, but recent low risk stress test. Cardiac clearance and aspirin hold time at discretion of Dr. Ellyn Hack. Will route this message to him as well.  Thanks.

## 2015-07-24 NOTE — Telephone Encounter (Signed)
Please forward note below from patient's cardiologist to his urologist. Thanks.

## 2015-07-24 NOTE — Telephone Encounter (Signed)
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 Ellyn Hack given to patient

## 2015-07-24 NOTE — Telephone Encounter (Signed)
Low risk pt for low risk surgery. OK to stop ASA 5-7 days pre-op.  Leonie Man, MD

## 2015-07-24 NOTE — Telephone Encounter (Signed)
Follow up ° ° ° ° ° °Returning a call to the nurse to get stress test results °

## 2015-07-25 DIAGNOSIS — M6281 Muscle weakness (generalized): Secondary | ICD-10-CM | POA: Diagnosis not present

## 2015-07-25 DIAGNOSIS — N138 Other obstructive and reflux uropathy: Secondary | ICD-10-CM | POA: Diagnosis not present

## 2015-07-25 NOTE — Telephone Encounter (Signed)
I believe pt goes to [facility].

## 2015-07-26 ENCOUNTER — Telehealth: Payer: Self-pay | Admitting: *Deleted

## 2015-07-26 NOTE — Telephone Encounter (Signed)
SIGNED SURGICAL CLEARANCE SENT TO DR TANNENBAUM'S OFFICE FOR TRANSVESICLE OPEN PROSTATECTOMY  PER DR HARDING , OKAY TO STOP ASPIRIN 5 DAYS PRE -OP IF NECESSARY

## 2015-08-01 NOTE — Patient Instructions (Addendum)
Carl Gutierrez  08/01/2015   Your procedure is scheduled on: Monday 08/13/2015   Report to [facility] Main  Entrance take [facility]  elevators to 3rd floor to  [place] at     -will call you for arrival time!  Call this number if you have problems the morning of surgery [phone]   Remember: ONLY 1 PERSON MAY GO WITH YOU TO SHORT STAY TO GET  READY MORNING OF YOUR SURGERY.  Do not eat food or drink liquids :After Midnight.     Take these medicines the morning of surgery with A SIP OF WATER: AMLODIPINE, LEVOTHYROXINE, METOPROLOL                             DO NOT TAKE ANY DIABETIC MEDICATIONS DAY OF YOUR SURGERY!                               You may not have any metal on your body including hair pins and              piercings  Do not wear jewelry, make-up, lotions, powders or perfumes, deodorant             Do not wear nail polish.  Do not shave  48 hours prior to surgery.              Men may shave face and neck.   Do not bring valuables to the hospital. [place].  Contacts, dentures or bridgework may not be worn into surgery.  Leave suitcase in the car. After surgery it may be brought to your room.                  Please read over the following fact sheets you were given: _____________________________________________________________________             [facility] - Preparing for Surgery Before surgery, you can play an important role.  Because skin is not sterile, your skin needs to be as free of germs as possible.  You can reduce the number of germs on your skin by washing with CHG (chlorahexidine gluconate) soap before surgery.  CHG is an antiseptic cleaner which kills germs and bonds with the skin to continue killing germs even after washing. Please DO NOT use if you have an allergy to CHG or antibacterial soaps.  If your skin becomes reddened/irritated stop using the CHG and inform your nurse  when you arrive at Short Stay. Do not shave (including legs and underarms) for at least 48 hours prior to the first CHG shower.  You may shave your face/neck. Please follow these instructions carefully:  1.  Shower with CHG Soap the night before surgery and the  morning of Surgery.  2.  If you choose to wash your hair, wash your hair first as usual with your  normal  shampoo.  3.  After you shampoo, rinse your hair and body thoroughly to remove the  shampoo.                           4.  Use CHG as you would any other liquid soap.  You can apply chg directly  to the skin and wash                       Gently with a scrungie or clean washcloth.  5.  Apply the CHG Soap to your body ONLY FROM THE NECK DOWN.   Do not use on face/ open                           Wound or open sores. Avoid contact with eyes, ears mouth and genitals (private parts).                       Wash face,  Genitals (private parts) with your normal soap.             6.  Wash thoroughly, paying special attention to the area where your surgery  will be performed.  7.  Thoroughly rinse your body with warm water from the neck down.  8.  DO NOT shower/wash with your normal soap after using and rinsing off  the CHG Soap.                9.  Pat yourself dry with a clean towel.            10.  Wear clean pajamas.            11.  Place clean sheets on your bed the night of your first shower and do not  sleep with pets. Day of Surgery : Do not apply any lotions/deodorants the morning of surgery.  Please wear clean clothes to the hospital/surgery center.  FAILURE TO FOLLOW THESE INSTRUCTIONS MAY RESULT IN THE CANCELLATION OF YOUR SURGERY PATIENT SIGNATURE_________________________________  NURSE SIGNATURE__________________________________  ________________________________________________________________________   Adam Phenix  An incentive spirometer is a tool that can help keep your lungs clear and active. This tool  measures how well you are filling your lungs with each breath. Taking long deep breaths may help reverse or decrease the chance of developing breathing (pulmonary) problems (especially infection) following:  A long period of time when you are unable to move or be active. BEFORE THE PROCEDURE   If the spirometer includes an indicator to show your best effort, your nurse or respiratory therapist will set it to a desired goal.  If possible, sit up straight or lean slightly forward. Try not to slouch.  Hold the incentive spirometer in an upright position. INSTRUCTIONS FOR USE   Sit on the edge of your bed if possible, or sit up as far as you can in bed or on a chair.  Hold the incentive spirometer in an upright position.  Breathe out normally.  Place the mouthpiece in your mouth and seal your lips tightly around it.  Breathe in slowly and as deeply as possible, raising the piston or the ball toward the top of the column.  Hold your breath for 3-5 seconds or for as long as possible. Allow the piston or ball to fall to the bottom of the column.  Remove the mouthpiece from your mouth and breathe out normally.  Rest for a few seconds and repeat Steps 1 through 7 at least 10 times every 1-2 hours when you are awake. Take your time and take a few normal breaths between deep breaths.  The spirometer may include an indicator to show your best effort. Use the indicator as a goal to work toward  during each repetition.  After each set of 10 deep breaths, practice coughing to be sure your lungs are clear. If you have an incision (the cut made at the time of surgery), support your incision when coughing by placing a pillow or rolled up towels firmly against it. Once you are able to get out of bed, walk around indoors and cough well. You may stop using the incentive spirometer when instructed by your caregiver.  RISKS AND COMPLICATIONS  Take your time so you do not get dizzy or light-headed.  If  you are in pain, you may need to take or ask for pain medication before doing incentive spirometry. It is harder to take a deep breath if you are having pain. AFTER USE  Rest and breathe slowly and easily.  It can be helpful to keep track of a log of your progress. Your caregiver can provide you with a simple table to help with this. If you are using the spirometer at home, follow these instructions: [place] IF:   You are having difficultly using the spirometer.  You have trouble using the spirometer as often as instructed.  Your pain medication is not giving enough relief while using the spirometer.  You develop fever of 100.5 F (38.1 C) or higher. SEEK IMMEDIATE MEDICAL CARE IF:   You cough up bloody sputum that had not been present before.  You develop fever of 102 F (38.9 C) or greater.  You develop worsening pain at or near the incision site. MAKE SURE YOU:   Understand these instructions.  Will watch your condition.  Will get help right away if you are not doing well or get worse. Document Released: 10/13/2006 Document Revised: 08/25/2011 Document Reviewed: 12/14/2006 ExitCare Patient Information 2014 [employer], [place].   ________________________________________________________________________  WHAT IS A BLOOD TRANSFUSION? Blood Transfusion Information  A transfusion is the replacement of blood or some of its parts. Blood is made up of multiple cells which provide different functions.  Red blood cells carry oxygen and are used for blood loss replacement.  White blood cells fight against infection.  Platelets control bleeding.  Plasma helps clot blood.  Other blood products are available for specialized needs, such as hemophilia or other clotting disorders. BEFORE THE TRANSFUSION  Who gives blood for transfusions?   Healthy volunteers who are fully evaluated to make sure their blood is safe. This is blood bank blood. Transfusion therapy is the  safest it has ever been in the practice of medicine. Before blood is taken from a donor, a complete history is taken to make sure that person has no history of diseases nor engages in risky social behavior (examples are intravenous drug use or sexual activity with multiple partners). The donor's travel history is screened to minimize risk of transmitting infections, such as malaria. The donated blood is tested for signs of infectious diseases, such as HIV and hepatitis. The blood is then tested to be sure it is compatible with you in order to minimize the chance of a transfusion reaction. If you or a relative donates blood, this is often done in anticipation of surgery and is not appropriate for emergency situations. It takes many days to process the donated blood. RISKS AND COMPLICATIONS Although transfusion therapy is very safe and saves many lives, the main dangers of transfusion include:   Getting an infectious disease.  Developing a transfusion reaction. This is an allergic reaction to something in the blood you were given. Every precaution is taken to  prevent this. The decision to have a blood transfusion has been considered carefully by your caregiver before blood is given. Blood is not given unless the benefits outweigh the risks. AFTER THE TRANSFUSION  Right after receiving a blood transfusion, you will usually feel much better and more energetic. This is especially true if your red blood cells have gotten low (anemic). The transfusion raises the level of the red blood cells which carry oxygen, and this usually causes an energy increase.  The nurse administering the transfusion will monitor you carefully for complications. HOME CARE INSTRUCTIONS  No special instructions are needed after a transfusion. You may find your energy is better. Speak with your caregiver about any limitations on activity for underlying diseases you may have. SEEK MEDICAL CARE IF:   Your condition is not improving  after your transfusion.  You develop redness or irritation at the intravenous (IV) site. SEEK IMMEDIATE MEDICAL CARE IF:  Any of the following symptoms occur over the next 12 hours:  Shaking chills.  You have a temperature by mouth above 102 F (38.9 C), not controlled by medicine.  Chest, back, or muscle pain.  People around you feel you are not acting correctly or are confused.  Shortness of breath or difficulty breathing.  Dizziness and fainting.  You get a rash or develop hives.  You have a decrease in urine output.  Your urine turns a dark color or changes to pink, red, or brown. Any of the following symptoms occur over the next 10 days:  You have a temperature by mouth above 102 F (38.9 C), not controlled by medicine.  Shortness of breath.  Weakness after normal activity.  The white part of the eye turns yellow (jaundice).  You have a decrease in the amount of urine or are urinating less often.  Your urine turns a dark color or changes to pink, red, or brown. Document Released: 05/30/2000 Document Revised: 08/25/2011 Document Reviewed: 01/17/2008 [facility] Patient Information 2014 [place], [place].  _______________________________________________________________________

## 2015-08-06 DIAGNOSIS — N138 Other obstructive and reflux uropathy: Secondary | ICD-10-CM | POA: Diagnosis not present

## 2015-08-06 DIAGNOSIS — R351 Nocturia: Secondary | ICD-10-CM | POA: Diagnosis not present

## 2015-08-06 DIAGNOSIS — M6281 Muscle weakness (generalized): Secondary | ICD-10-CM | POA: Diagnosis not present

## 2015-08-06 DIAGNOSIS — N401 Enlarged prostate with lower urinary tract symptoms: Secondary | ICD-10-CM | POA: Diagnosis not present

## 2015-08-07 ENCOUNTER — Encounter (HOSPITAL_COMMUNITY)
Admission: RE | Admit: 2015-08-07 | Discharge: 2015-08-07 | Disposition: A | Discharge status: Home / Self Care | Payer: Medicare Other | Source: Ambulatory Visit | Attending: Urology | Admitting: Urology

## 2015-08-07 ENCOUNTER — Encounter (HOSPITAL_COMMUNITY): Payer: Self-pay

## 2015-08-07 DIAGNOSIS — N400 Enlarged prostate without lower urinary tract symptoms: Secondary | ICD-10-CM | POA: Insufficient documentation

## 2015-08-07 DIAGNOSIS — Z0183 Encounter for blood typing: Secondary | ICD-10-CM | POA: Diagnosis not present

## 2015-08-07 DIAGNOSIS — L821 Other seborrheic keratosis: Secondary | ICD-10-CM | POA: Diagnosis not present

## 2015-08-07 DIAGNOSIS — Z01812 Encounter for preprocedural laboratory examination: Secondary | ICD-10-CM | POA: Insufficient documentation

## 2015-08-07 DIAGNOSIS — L723 Sebaceous cyst: Secondary | ICD-10-CM | POA: Diagnosis not present

## 2015-08-07 LAB — BASIC METABOLIC PANEL
Anion gap: 11 (ref 5–15)
BUN: 24 mg/dL — ABNORMAL HIGH (ref 6–20)
CO2: 21 mmol/L — ABNORMAL LOW (ref 22–32)
Calcium: 9 mg/dL (ref 8.9–10.3)
Chloride: 106 mmol/L (ref 101–111)
Creatinine, Ser: 1.26 mg/dL — ABNORMAL HIGH (ref 0.61–1.24)
GFR calc Af Amer: >60 mL/min (ref >60)
GFR calc non Af Amer: 56 mL/min — ABNORMAL LOW (ref >60)
Glucose, Bld: 221 mg/dL — ABNORMAL HIGH (ref 65–99)
Potassium: 4.2 mmol/L (ref 3.5–5.1)
Sodium: 138 mmol/L (ref 135–145)

## 2015-08-07 LAB — CBC
HCT: 45.7 % (ref 39.0–52.0)
Hemoglobin: 15.2 g/dL (ref 13.0–17.0)
MCH: 30.4 pg (ref 26.0–34.0)
MCHC: 33.3 g/dL (ref 30.0–36.0)
MCV: 91.4 fL (ref 78.0–100.0)
Platelets: 293 10*3/uL (ref 150–400)
RBC: 5 MIL/uL (ref 4.22–5.81)
RDW: 13.5 % (ref 11.5–15.5)
WBC: 5.9 10*3/uL (ref 4.0–10.5)

## 2015-08-07 LAB — ABO/RH: ABO/RH(D): O POS

## 2015-08-07 NOTE — Progress Notes (Signed)
Patient called and notified to arrive at Short Stay at [facility] at [place] am ! Patient and patient's wife verbalized understanding.

## 2015-08-07 NOTE — Progress Notes (Signed)
07/26/2015-Pre-operative clearance from Dr. Ellyn Hack on chart.

## 2015-08-09 ENCOUNTER — Telehealth: Payer: Self-pay

## 2015-08-09 NOTE — Telephone Encounter (Signed)
Patient received signed DOT card, but there was no amended CPE, Patient is going into surgery and states he has to have this done by Monday morning. States he received a signed card by Carl Gutierrez.  Carl Gutierrez, I will put paperwork in your box at 102, can you please make amendment to CPE for patient, and send down to the worker's comp desk at 104 so I can enter the new information into the [employer]?

## 2015-08-11 ENCOUNTER — Other Ambulatory Visit: Payer: Self-pay | Admitting: Physician Assistant

## 2015-08-11 NOTE — Telephone Encounter (Signed)
See epic encounter for DOT on 12.2.16.

## 2015-08-11 NOTE — Progress Notes (Addendum)
Addendum:  Per Dr. Lillie Fragmin note patient went for stress test.  The test was performed on 07/19/2015 and can be found in the CV procedure tab in [facility].  This was reviewed by me and showed no acute findings.  His last three BP in [facility] have been below 140/90.   His card was extended to one year from the day of this exam by Dr. Lorelei Pont.  Philis Fendt, MS, PA-C 5:42 PM, 08/11/2015

## 2015-08-13 ENCOUNTER — Inpatient Hospital Stay (HOSPITAL_COMMUNITY): Payer: Medicare Other | Admitting: Anesthesiology

## 2015-08-13 ENCOUNTER — Encounter (HOSPITAL_COMMUNITY)
Admission: RE | Disposition: A | Discharge status: Home / Self Care | Payer: Self-pay | Source: Ambulatory Visit | Attending: Urology

## 2015-08-13 ENCOUNTER — Encounter (HOSPITAL_COMMUNITY): Payer: Self-pay | Admitting: *Deleted

## 2015-08-13 ENCOUNTER — Inpatient Hospital Stay (HOSPITAL_COMMUNITY)
Admission: RE | Admit: 2015-08-13 | Discharge: 2015-08-16 | DRG: 707 | Disposition: A | Discharge status: Home / Self Care | Payer: Medicare Other | Source: Ambulatory Visit | Attending: Urology | Admitting: Urology

## 2015-08-13 DIAGNOSIS — N401 Enlarged prostate with lower urinary tract symptoms: Secondary | ICD-10-CM | POA: Diagnosis present

## 2015-08-13 DIAGNOSIS — R509 Fever, unspecified: Secondary | ICD-10-CM

## 2015-08-13 DIAGNOSIS — Z6832 Body mass index (BMI) 32.0-32.9, adult: Secondary | ICD-10-CM | POA: Diagnosis not present

## 2015-08-13 DIAGNOSIS — J9811 Atelectasis: Secondary | ICD-10-CM | POA: Diagnosis not present

## 2015-08-13 DIAGNOSIS — N400 Enlarged prostate without lower urinary tract symptoms: Secondary | ICD-10-CM | POA: Diagnosis present

## 2015-08-13 DIAGNOSIS — E669 Obesity, unspecified: Secondary | ICD-10-CM | POA: Diagnosis present

## 2015-08-13 DIAGNOSIS — Z79899 Other long term (current) drug therapy: Secondary | ICD-10-CM

## 2015-08-13 DIAGNOSIS — R351 Nocturia: Secondary | ICD-10-CM | POA: Diagnosis present

## 2015-08-13 DIAGNOSIS — Z86718 Personal history of other venous thrombosis and embolism: Secondary | ICD-10-CM

## 2015-08-13 DIAGNOSIS — E039 Hypothyroidism, unspecified: Secondary | ICD-10-CM | POA: Diagnosis present

## 2015-08-13 DIAGNOSIS — I252 Old myocardial infarction: Secondary | ICD-10-CM

## 2015-08-13 DIAGNOSIS — Z7952 Long term (current) use of systemic steroids: Secondary | ICD-10-CM | POA: Diagnosis not present

## 2015-08-13 DIAGNOSIS — K5730 Diverticulosis of large intestine without perforation or abscess without bleeding: Secondary | ICD-10-CM | POA: Diagnosis present

## 2015-08-13 DIAGNOSIS — Z7982 Long term (current) use of aspirin: Secondary | ICD-10-CM | POA: Diagnosis not present

## 2015-08-13 DIAGNOSIS — Z87891 Personal history of nicotine dependence: Secondary | ICD-10-CM | POA: Diagnosis not present

## 2015-08-13 DIAGNOSIS — I10 Essential (primary) hypertension: Secondary | ICD-10-CM | POA: Diagnosis not present

## 2015-08-13 DIAGNOSIS — E119 Type 2 diabetes mellitus without complications: Secondary | ICD-10-CM | POA: Diagnosis present

## 2015-08-13 DIAGNOSIS — Z87442 Personal history of urinary calculi: Secondary | ICD-10-CM

## 2015-08-13 DIAGNOSIS — Z833 Family history of diabetes mellitus: Secondary | ICD-10-CM | POA: Diagnosis not present

## 2015-08-13 DIAGNOSIS — N138 Other obstructive and reflux uropathy: Secondary | ICD-10-CM | POA: Diagnosis not present

## 2015-08-13 HISTORY — PX: PROSTATECTOMY: SHX69

## 2015-08-13 LAB — CBC
HCT: 46.5 % (ref 39.0–52.0)
Hemoglobin: 15.4 g/dL (ref 13.0–17.0)
MCH: 30.6 pg (ref 26.0–34.0)
MCHC: 33.1 g/dL (ref 30.0–36.0)
MCV: 92.4 fL (ref 78.0–100.0)
Platelets: 307 10*3/uL (ref 150–400)
RBC: 5.03 MIL/uL (ref 4.22–5.81)
RDW: 13.5 % (ref 11.5–15.5)
WBC: 11 10*3/uL — ABNORMAL HIGH (ref 4.0–10.5)

## 2015-08-13 LAB — BASIC METABOLIC PANEL
Anion gap: 11 (ref 5–15)
BUN: 17 mg/dL (ref 6–20)
CO2: 24 mmol/L (ref 22–32)
Calcium: 9 mg/dL (ref 8.9–10.3)
Chloride: 107 mmol/L (ref 101–111)
Creatinine, Ser: 1.43 mg/dL — ABNORMAL HIGH (ref 0.61–1.24)
GFR calc Af Amer: 55 mL/min — ABNORMAL LOW (ref >60)
GFR calc non Af Amer: 48 mL/min — ABNORMAL LOW (ref >60)
Glucose, Bld: 162 mg/dL — ABNORMAL HIGH (ref 65–99)
Potassium: 4.1 mmol/L (ref 3.5–5.1)
Sodium: 142 mmol/L (ref 135–145)

## 2015-08-13 LAB — GLUCOSE, CAPILLARY
Glucose-Capillary: 75 mg/dL (ref 65–99)
Glucose-Capillary: 79 mg/dL (ref 65–99)
Glucose-Capillary: 122 mg/dL — ABNORMAL HIGH (ref 65–99)
Glucose-Capillary: 125 mg/dL — ABNORMAL HIGH (ref 65–99)

## 2015-08-13 LAB — TYPE AND SCREEN
ABO/RH(D): O POS
Antibody Screen: NEGATIVE

## 2015-08-13 SURGERY — PROSTATECTOMY
Anesthesia: General

## 2015-08-13 MED ORDER — METOPROLOL TARTRATE 1 MG/ML IV SOLN
INTRAVENOUS | Status: DC | PRN
  Administered 2015-08-13: 2.5 mg via INTRAVENOUS

## 2015-08-13 MED ORDER — BELLADONNA ALKALOIDS-OPIUM 16.2-60 MG RE SUPP: 1.0000 | Freq: Four times a day (QID) | RECTAL | Status: DC | PRN

## 2015-08-13 MED ORDER — AMLODIPINE BESYLATE 10 MG PO TABS
10.0000 mg | ORAL_TABLET | Freq: Every day | ORAL | Status: DC
  Administered 2015-08-14 – 2015-08-16 (×3): 10 mg via ORAL
  Filled 2015-08-13 (×3): qty 1

## 2015-08-13 MED ORDER — LIDOCAINE HCL (CARDIAC) 20 MG/ML IV SOLN
INTRAVENOUS | Status: DC | PRN
  Administered 2015-08-13: 60 mg via INTRAVENOUS

## 2015-08-13 MED ORDER — LACTATED RINGERS IV SOLN: INTRAVENOUS | Status: DC | PRN

## 2015-08-13 MED ORDER — PHENYLEPHRINE 40 MCG/ML (10ML) SYRINGE FOR IV PUSH (FOR BLOOD PRESSURE SUPPORT)
PREFILLED_SYRINGE | INTRAVENOUS | Status: AC
  Filled 2015-08-13: qty 10

## 2015-08-13 MED ORDER — HYDROCORTISONE NA SUCCINATE PF 100 MG IJ SOLR
INTRAMUSCULAR | Status: AC
  Filled 2015-08-13: qty 2

## 2015-08-13 MED ORDER — HYDROCORTISONE NA SUCCINATE PF 100 MG IJ SOLR
50.0000 mg | Freq: Once | INTRAMUSCULAR | Status: AC
  Administered 2015-08-14: 50 mg via INTRAVENOUS
  Filled 2015-08-13: qty 2
  Filled 2015-08-13: qty 1

## 2015-08-13 MED ORDER — MORPHINE SULFATE (PF) 2 MG/ML IV SOLN
2.0000 mg | INTRAVENOUS | Status: DC | PRN
  Administered 2015-08-13: 2 mg via INTRAVENOUS
  Filled 2015-08-13: qty 2

## 2015-08-13 MED ORDER — NEOSTIGMINE METHYLSULFATE 10 MG/10ML IV SOLN
INTRAVENOUS | Status: DC | PRN
  Administered 2015-08-13: 4 mg via INTRAVENOUS

## 2015-08-13 MED ORDER — EPHEDRINE SULFATE 50 MG/ML IJ SOLN
INTRAMUSCULAR | Status: AC
  Filled 2015-08-13: qty 1

## 2015-08-13 MED ORDER — SENNA 8.6 MG PO TABS
1.0000 | ORAL_TABLET | Freq: Two times a day (BID) | ORAL | Status: DC
  Administered 2015-08-13 – 2015-08-16 (×6): 8.6 mg via ORAL
  Filled 2015-08-13 (×6): qty 1

## 2015-08-13 MED ORDER — PHENYLEPHRINE HCL 10 MG/ML IJ SOLN
INTRAMUSCULAR | Status: DC | PRN
  Administered 2015-08-13: 40 ug via INTRAVENOUS
  Administered 2015-08-13 (×2): 80 ug via INTRAVENOUS

## 2015-08-13 MED ORDER — MIDAZOLAM HCL 5 MG/5ML IJ SOLN
INTRAMUSCULAR | Status: DC | PRN
  Administered 2015-08-13: 2 mg via INTRAVENOUS

## 2015-08-13 MED ORDER — ONDANSETRON HCL 4 MG/2ML IJ SOLN: 4.0000 mg | Freq: Once | INTRAMUSCULAR | Status: DC | PRN

## 2015-08-13 MED ORDER — ALBUMIN HUMAN 5 % IV SOLN
INTRAVENOUS | Status: AC
  Filled 2015-08-13: qty 500

## 2015-08-13 MED ORDER — KETOROLAC TROMETHAMINE 30 MG/ML IJ SOLN
30.0000 mg | Freq: Once | INTRAMUSCULAR | Status: AC
  Administered 2015-08-13: 15 mg via INTRAVENOUS

## 2015-08-13 MED ORDER — LIDOCAINE HCL (CARDIAC) 20 MG/ML IV SOLN
INTRAVENOUS | Status: AC
  Filled 2015-08-13: qty 5

## 2015-08-13 MED ORDER — METOPROLOL TARTRATE 1 MG/ML IV SOLN
INTRAVENOUS | Status: AC
  Filled 2015-08-13: qty 5

## 2015-08-13 MED ORDER — HYDROCORTISONE 10 MG PO TABS
10.0000 mg | ORAL_TABLET | Freq: Every day | ORAL | Status: DC
  Filled 2015-08-13: qty 1

## 2015-08-13 MED ORDER — ROCURONIUM BROMIDE 100 MG/10ML IV SOLN
INTRAVENOUS | Status: DC | PRN
  Administered 2015-08-13: 50 mg via INTRAVENOUS
  Administered 2015-08-13: 10 mg via INTRAVENOUS

## 2015-08-13 MED ORDER — FENTANYL CITRATE (PF) 250 MCG/5ML IJ SOLN
INTRAMUSCULAR | Status: AC
  Filled 2015-08-13: qty 5

## 2015-08-13 MED ORDER — PROPOFOL 10 MG/ML IV BOLUS
INTRAVENOUS | Status: AC
  Filled 2015-08-13: qty 20

## 2015-08-13 MED ORDER — ONDANSETRON HCL 4 MG/2ML IJ SOLN
INTRAMUSCULAR | Status: DC | PRN
  Administered 2015-08-13: 4 mg via INTRAVENOUS

## 2015-08-13 MED ORDER — SODIUM CHLORIDE 0.9 % IR SOLN
3000.0000 mL | Status: DC
  Administered 2015-08-13 – 2015-08-15 (×5): 3000 mL

## 2015-08-13 MED ORDER — GLYCOPYRROLATE 0.2 MG/ML IJ SOLN
INTRAMUSCULAR | Status: AC
  Filled 2015-08-13: qty 3

## 2015-08-13 MED ORDER — PROPOFOL 10 MG/ML IV BOLUS
INTRAVENOUS | Status: DC | PRN
  Administered 2015-08-13: 20 mg via INTRAVENOUS
  Administered 2015-08-13: 180 mg via INTRAVENOUS
  Administered 2015-08-13: 30 mg via INTRAVENOUS

## 2015-08-13 MED ORDER — FENTANYL CITRATE (PF) 100 MCG/2ML IJ SOLN
INTRAMUSCULAR | Status: AC
  Filled 2015-08-13: qty 2

## 2015-08-13 MED ORDER — ONDANSETRON HCL 4 MG/2ML IJ SOLN
INTRAMUSCULAR | Status: AC
  Filled 2015-08-13: qty 2

## 2015-08-13 MED ORDER — OXYCODONE-ACETAMINOPHEN 5-325 MG PO TABS
1.0000 | ORAL_TABLET | ORAL | Status: DC | PRN
  Administered 2015-08-14 – 2015-08-16 (×3): 2 via ORAL
  Filled 2015-08-13 (×3): qty 2

## 2015-08-13 MED ORDER — HYDROCORTISONE NA SUCCINATE PF 100 MG IJ SOLR
100.0000 mg | Freq: Once | INTRAMUSCULAR | Status: DC
  Administered 2015-08-13: 100 mg via INTRAVENOUS
  Filled 2015-08-13: qty 2

## 2015-08-13 MED ORDER — SUCCINYLCHOLINE CHLORIDE 20 MG/ML IJ SOLN
INTRAMUSCULAR | Status: DC | PRN
  Administered 2015-08-13: 100 mg via INTRAVENOUS

## 2015-08-13 MED ORDER — HYDROCORTISONE 5 MG PO TABS
5.0000 mg | ORAL_TABLET | Freq: Every evening | ORAL | Status: DC
  Filled 2015-08-13: qty 1

## 2015-08-13 MED ORDER — CEFAZOLIN SODIUM-DEXTROSE 2-3 GM-% IV SOLR
2.0000 g | INTRAVENOUS | Status: AC
  Administered 2015-08-13: 2 g via INTRAVENOUS

## 2015-08-13 MED ORDER — EPHEDRINE SULFATE 50 MG/ML IJ SOLN
INTRAMUSCULAR | Status: DC | PRN
  Administered 2015-08-13: 10 mg via INTRAVENOUS

## 2015-08-13 MED ORDER — ROCURONIUM BROMIDE 100 MG/10ML IV SOLN
INTRAVENOUS | Status: AC
  Filled 2015-08-13: qty 1

## 2015-08-13 MED ORDER — MIDAZOLAM HCL 2 MG/2ML IJ SOLN
INTRAMUSCULAR | Status: AC
  Filled 2015-08-13: qty 2

## 2015-08-13 MED ORDER — CEFAZOLIN SODIUM-DEXTROSE 2-3 GM-% IV SOLR
INTRAVENOUS | Status: AC
  Filled 2015-08-13: qty 50

## 2015-08-13 MED ORDER — ACETAMINOPHEN 500 MG PO TABS
1000.0000 mg | ORAL_TABLET | Freq: Four times a day (QID) | ORAL | Status: DC | PRN
  Administered 2015-08-13: 1000 mg via ORAL
  Filled 2015-08-13: qty 2

## 2015-08-13 MED ORDER — BUPIVACAINE LIPOSOME 1.3 % IJ SUSP
20.0000 mL | Freq: Once | INTRAMUSCULAR | Status: AC
  Administered 2015-08-13: 20 mL
  Filled 2015-08-13: qty 20

## 2015-08-13 MED ORDER — DOCUSATE SODIUM 100 MG PO CAPS
100.0000 mg | ORAL_CAPSULE | Freq: Two times a day (BID) | ORAL | Status: DC
  Administered 2015-08-13 – 2015-08-16 (×6): 100 mg via ORAL
  Filled 2015-08-13 (×6): qty 1

## 2015-08-13 MED ORDER — LACTATED RINGERS IV SOLN
INTRAVENOUS | Status: DC | PRN
  Administered 2015-08-13 (×3): via INTRAVENOUS

## 2015-08-13 MED ORDER — FENTANYL CITRATE (PF) 100 MCG/2ML IJ SOLN
25.0000 ug | INTRAMUSCULAR | Status: DC | PRN
  Administered 2015-08-13: 50 ug via INTRAVENOUS

## 2015-08-13 MED ORDER — SODIUM CHLORIDE 0.9 % IJ SOLN
INTRAMUSCULAR | Status: AC
  Filled 2015-08-13: qty 10

## 2015-08-13 MED ORDER — DEXTROSE-NACL 5-0.45 % IV SOLN
INTRAVENOUS | Status: DC
  Administered 2015-08-13 – 2015-08-16 (×5): via INTRAVENOUS

## 2015-08-13 MED ORDER — NEOSTIGMINE METHYLSULFATE 10 MG/10ML IV SOLN
INTRAVENOUS | Status: AC
  Filled 2015-08-13: qty 1

## 2015-08-13 MED ORDER — FENTANYL CITRATE (PF) 100 MCG/2ML IJ SOLN
INTRAMUSCULAR | Status: DC | PRN
  Administered 2015-08-13 (×3): 50 ug via INTRAVENOUS
  Administered 2015-08-13: 100 ug via INTRAVENOUS
  Administered 2015-08-13 (×3): 50 ug via INTRAVENOUS
  Administered 2015-08-13: 100 ug via INTRAVENOUS

## 2015-08-13 MED ORDER — LEVOTHYROXINE SODIUM 88 MCG PO TABS
88.0000 ug | ORAL_TABLET | Freq: Every day | ORAL | Status: DC
  Administered 2015-08-14 – 2015-08-16 (×3): 88 ug via ORAL
  Filled 2015-08-13 (×3): qty 1

## 2015-08-13 MED ORDER — SODIUM CHLORIDE 0.9 % IR SOLN
Status: DC | PRN
  Administered 2015-08-13: 2000 mL

## 2015-08-13 MED ORDER — DEXAMETHASONE SODIUM PHOSPHATE 10 MG/ML IJ SOLN
INTRAMUSCULAR | Status: AC
  Filled 2015-08-13: qty 1

## 2015-08-13 MED ORDER — INSULIN ASPART 100 UNIT/ML ~~LOC~~ SOLN
0.0000 [IU] | Freq: Three times a day (TID) | SUBCUTANEOUS | Status: DC
  Administered 2015-08-14 – 2015-08-15 (×6): 2 [IU] via SUBCUTANEOUS

## 2015-08-13 MED ORDER — BUPIVACAINE LIPOSOME 1.3 % IJ SUSP
20.0000 mL | Freq: Once | INTRAMUSCULAR | Status: DC
  Filled 2015-08-13: qty 20

## 2015-08-13 MED ORDER — METOPROLOL SUCCINATE ER 25 MG PO TB24
25.0000 mg | ORAL_TABLET | Freq: Every day | ORAL | Status: DC
  Administered 2015-08-14 – 2015-08-16 (×3): 25 mg via ORAL
  Filled 2015-08-13 (×3): qty 1

## 2015-08-13 MED ORDER — CEFAZOLIN SODIUM 1-5 GM-% IV SOLN
1.0000 g | Freq: Three times a day (TID) | INTRAVENOUS | Status: AC
  Administered 2015-08-13 – 2015-08-14 (×2): 1 g via INTRAVENOUS
  Filled 2015-08-13 (×3): qty 50

## 2015-08-13 MED ORDER — SODIUM CHLORIDE 0.9 % IV BOLUS (SEPSIS): 1000.0000 mL | Freq: Once | INTRAVENOUS | Status: DC

## 2015-08-13 MED ORDER — GLYCOPYRROLATE 0.2 MG/ML IJ SOLN
INTRAMUSCULAR | Status: DC | PRN
  Administered 2015-08-13: 0.6 mg via INTRAVENOUS

## 2015-08-13 MED ORDER — KETOROLAC TROMETHAMINE 30 MG/ML IJ SOLN
INTRAMUSCULAR | Status: AC
  Filled 2015-08-13: qty 1

## 2015-08-13 SURGICAL SUPPLY — 53 items
BAG URINE DRAINAGE (UROLOGICAL SUPPLIES) ×2 IMPLANT
BLADE EXTENDED COATED 6.5IN (ELECTRODE) ×2 IMPLANT
BLADE HEX COATED 2.75 (ELECTRODE) ×2 IMPLANT
BLADE SURG SZ12 CARB STEEL (BLADE) ×2 IMPLANT
CATH FOLEY 2WAY SLVR 30CC 22FR (CATHETERS) ×2 IMPLANT
CATH FOLEY 2WAY SLVR 30CC 24FR (CATHETERS) IMPLANT
CATH FOLEY 3WAY 30CC 24FR (CATHETERS)
CATH URTH STD 24FR FL 3W 2 (CATHETERS) IMPLANT
CLIP LIGATING HEM O LOK PURPLE (MISCELLANEOUS) ×4 IMPLANT
COVER SURGICAL LIGHT HANDLE (MISCELLANEOUS) ×2 IMPLANT
DRAIN CHANNEL 10F 3/8 F FF (DRAIN) IMPLANT
DRAPE LAPAROTOMY T 102X78X121 (DRAPES) ×2 IMPLANT
DRAPE UTILITY 15X26 (DRAPE) ×2 IMPLANT
DRAPE WARM FLUID 44X44 (DRAPE) ×2 IMPLANT
ELECT REM PT RETURN 9FT ADLT (ELECTROSURGICAL) ×2
ELECTRODE REM PT RTRN 9FT ADLT (ELECTROSURGICAL) ×1 IMPLANT
EVACUATOR SILICONE 100CC (DRAIN) IMPLANT
GAUZE SPONGE 4X4 12PLY STRL (GAUZE/BANDAGES/DRESSINGS) ×2 IMPLANT
GAUZE SPONGE 4X4 16PLY XRAY LF (GAUZE/BANDAGES/DRESSINGS) ×2 IMPLANT
GLOVE BIOGEL M STRL SZ7.5 (GLOVE) ×2 IMPLANT
GOWN STRL REUS W/TWL XL LVL3 (GOWN DISPOSABLE) ×2 IMPLANT
GUIDEWIRE STR DUAL SENSOR (WIRE) ×2 IMPLANT
KIT BASIN OR (CUSTOM PROCEDURE TRAY) ×2 IMPLANT
NEEDLE HYPO 22GX1.5 SAFETY (NEEDLE) ×2 IMPLANT
NS IRRIG 1000ML POUR BTL (IV SOLUTION) ×4 IMPLANT
PACK GENERAL/GYN (CUSTOM PROCEDURE TRAY) ×2 IMPLANT
PLUG CATH AND CAP STER (CATHETERS) ×4 IMPLANT
SCRUB PCMX 4 OZ (MISCELLANEOUS) ×2 IMPLANT
SET IRRIG Y TYPE TUR BLADDER L (SET/KITS/TRAYS/PACK) IMPLANT
SPOGE SURGIFLO 8M (HEMOSTASIS)
SPONGE LAP 18X18 X RAY DECT (DISPOSABLE) ×2 IMPLANT
SPONGE LAP 4X18 X RAY DECT (DISPOSABLE) ×2 IMPLANT
SPONGE SURGIFLO 8M (HEMOSTASIS) IMPLANT
STAPLER SKIN PROX WIDE 3.9 (STAPLE) ×2 IMPLANT
STAPLER VISISTAT 35W (STAPLE) IMPLANT
SURGIFLO W/THROMBIN 8M KIT (HEMOSTASIS) ×2 IMPLANT
SURGILUBE 3G PEEL PACK STRL (MISCELLANEOUS) ×8 IMPLANT
SUT ETHILON 3 0 PS 1 (SUTURE) ×4 IMPLANT
SUT PDS AB 1 CTX 36 (SUTURE) ×4 IMPLANT
SUT PDS AB 1 TP1 96 (SUTURE) ×4 IMPLANT
SUT SILK 0 (SUTURE) ×1
SUT SILK 0 30XBRD TIE 6 (SUTURE) ×1 IMPLANT
SUT SILK 2 0 (SUTURE)
SUT SILK 2-0 30XBRD TIE 12 (SUTURE) IMPLANT
SUT VIC AB 0 UR5 27 (SUTURE) ×8 IMPLANT
SUT VIC AB 2-0 UR5 27 (SUTURE) ×6 IMPLANT
SUT VIC AB 2-0 UR6 27 (SUTURE) ×2 IMPLANT
SYR 30ML LL (SYRINGE) ×2 IMPLANT
SYR CONTROL 10ML LL (SYRINGE) ×2 IMPLANT
TAPE CLOTH SURG 4X10 WHT LF (GAUZE/BANDAGES/DRESSINGS) ×2 IMPLANT
TOWEL OR 17X26 10 PK STRL BLUE (TOWEL DISPOSABLE) ×4 IMPLANT
TOWEL OR NON WOVEN STRL DISP B (DISPOSABLE) ×2 IMPLANT
WATER STERILE IRR 1500ML POUR (IV SOLUTION) ×2 IMPLANT

## 2015-08-13 NOTE — Progress Notes (Signed)
Dr. Gaynelle Arabian in to see patient- made aware of patient's condition

## 2015-08-13 NOTE — Progress Notes (Signed)
Foley catheter draining well- urine light red in color- no clots noted.

## 2015-08-13 NOTE — Transfer of Care (Signed)
Last Vitals:  Filed Vitals:   08/13/15 0926  BP: 146/82  Pulse: 59  Temp: 36.5 C  Resp: 16   Immediate Anesthesia Transfer of Care Note  Patient: Carl Gutierrez  Procedure(s) Performed: Procedure(s) (LRB): TRANSVESICLE OPEN PROSTATECTOMY**  (N/A)  Patient Location: PACU  Anesthesia Type: General  Level of Consciousness: awake, alert  and oriented  Airway & Oxygen Therapy: Patient Spontanous Breathing and Patient connected to face mask oxygen  Post-op Assessment: Report given to PACU RN and Post -op Vital signs reviewed and stable  Post vital signs: Reviewed and stable  Complications: No apparent anesthesia complications

## 2015-08-13 NOTE — Progress Notes (Signed)
Dr. Carlota Raspberry in to see patient- made aware of patient's condition-manipulated Foley catheter  Several times- irrigated it with Normal Saline- several times- catheter started draining- Dr. Carlota Raspberry connected Foley catheter to CBI- Normal Saline- rapidly infusing- catheter beginning to drain- red-colored urine- with few clots

## 2015-08-13 NOTE — Progress Notes (Signed)
Lab results noted. 

## 2015-08-13 NOTE — Progress Notes (Signed)
Jiles Crocker, NP returned call- made aware of patient's condition- he will notify M.D. ON CALL

## 2015-08-13 NOTE — Progress Notes (Signed)
Dr. Nyoka Cowden repaged

## 2015-08-13 NOTE — Progress Notes (Signed)
Foley catheter stopped draining- CBI  Stopped infusing- attempted to hand irrigate Foley catheter with Normal Saline- unable to do so- met resistance- Dr. Carlota Raspberry paged

## 2015-08-13 NOTE — Progress Notes (Signed)
Dr. Gaynelle Arabian paged

## 2015-08-13 NOTE — H&P (Signed)
Reason For Visit Cystoscopy & review CT results   Active Problems Problems  1. Benign localized hyperplasia of prostate with urinary obstruction (N40.1,N13.8) 2. Elevated prostate specific antigen (PSA) (R97.20) 3. Microscopic hematuria (R31.29) 4. Nocturia (R35.1)  History of Present Illness     70 yo married male, returns today for cystoscopy & to review CT results for hx of microhematuria. Referred back by Dr. Carlota Raspberry for further evaluation of an elevated PSA of 5.07 on 04/02/15. He has recently had u/a showing hematuria on driver's licence for [employer]. Hx of kidney stone, 2011. Hx of pituitary tumor 2012.     He has previously been seen for an elevated PSA of 5.81 on 08/19/10. Previous PSA was 0.80 ( May, 2011). Pt is was using Testim 1%, but now having injectable T per Dr. Dwyane Dee. . He has frequency, nocturia x 2, ED and kidney stones (previously treated by Dr. Venia Minks). IPSS = 11/7.   Past Medical History Problems  1. History of Acute Myocardial Infarction 2. History of Gout (M10.9) 3. History of deep venous thrombosis (Z86.718) 4. History of diabetes mellitus (Z86.39) 5. History of hypercholesterolemia (Z86.39) 6. History of hypertension (Z86.79) 7. History of hypothyroidism (Z86.39) 8. History of kidney stones (Z87.442) 9. History of Thrombophlebitis Of Deep Vessels Of The Lower Extremity  Surgical History Problems  1. History of Back Surgery 2. History of Cath Stent Placement 3. History of Cystoscopy With Manipulation Of Ureteral Calculus 4. History of Neuroendosc Dissect Adhesion Excise Pituitary Tumor  Current Meds 1. AmLODIPine Besylate 10 MG Oral Tablet;  Therapy: (Recorded:06Dec2016) to Recorded 2. Aspirin 81 MG TABS;  Therapy: (Recorded:03Apr2012) to Recorded 3. Hydrocortisone 10 MG Oral Tablet;  Therapy: (Recorded:03Apr2012) to Recorded 4. Hydrocortisone 20 MG Oral Tablet;  Therapy: (Recorded:03Apr2012) to Recorded 5. Hydrocortisone 20 MG  Oral Tablet;  Therapy: (Recorded:06Dec2016) to Recorded 6. Levothyroxine Sodium 75 MCG Oral Tablet;  Therapy: (Recorded:03Apr2012) to Recorded 7. Lisinopril 5 MG Oral Tablet;  Therapy: (Recorded:03Apr2012) to Recorded 8. MetFORMIN HCl - 500 MG Oral Tablet;  Therapy: (Recorded:03Apr2012) to Recorded 9. Simvastatin 80 MG Oral Tablet;  Therapy: (Recorded:03Apr2012) to Recorded 10. Testosterone Cypionate 200 MG/ML OIL;   Therapy: (Recorded:06Dec2016) to Recorded 11. Vitamin D3 2000 UNIT Oral Tablet;   Therapy: (Recorded:03Apr2012) to Recorded  Allergies Medication  1. No Known Drug Allergies  Family History Problems  1. Family history of Diabetes Mellitus : Mother 2. Family history of Diabetes Mellitus : Brother 3. Family history of Diabetes Mellitus : Sister 4. Family history of Family Health Status Number Of Children : Mother   1 son, 1 daughter 5. Family history of Father Deceased At Age ___ : Mother   26, pneumonia 47. Family history of Malignant Pancreatic Neoplasm : Mother 7. Family history of Mother Deceased At Age ___ : Mother   43, pancreatic cancer  Social History Problems  1. Denied: History of Alcohol Use (History) 2. Denied: History of Caffeine Use 3. Former smoker (Z87.891)   < 1 ppd x 70yr, quit 333yrago 4. Marital History - Currently Married 5. Number of children   1 son/ 1 daughter 6. Occupation:   TrAdministratorReview of Systems Skin, eye, otolaryngeal, hematologic/lymphatic, cardiovascular, pulmonary, endocrine, musculoskeletal, gastrointestinal, neurological and psychiatric system(s) were reviewed and pertinent findings if present are noted and are otherwise negative.  Genitourinary: feelings of urinary urgency, nocturia, difficulty starting the urinary stream, weak urinary stream, urinary stream starts and stops, incomplete emptying of bladder and hematuria, but no urinary frequency, no  dysuria, no incontinence, no post-void dribbling and  initiating urination does not require straining.    Vitals Vital Signs [Data Includes: Last 1 Day]  Recorded: 20Jan2017 03:14PM  Blood Pressure: 154 / 86 Temperature: 97.1 F Heart Rate: 67  Physical Exam Constitutional: Well nourished and well developed . No acute distress.  ENT:. The ears and nose are normal in appearance.  Neck: The appearance of the neck is normal and no neck mass is present.  Pulmonary: No respiratory distress and normal respiratory rhythm and effort.  Cardiovascular: Heart rate and rhythm are normal . No peripheral edema.  Abdomen: The abdomen is mildly obese. The abdomen is soft and nontender. No masses are palpated. No CVA tenderness. No hernias are palpable. No hepatosplenomegaly noted.  Rectal: Estimated prostate size is 3+. Normal rectal tone, no rectal masses, prostate is smooth, symmetric and non-tender.  Genitourinary: Examination of the penis demonstrates no discharge, no masses, no lesions and a normal meatus. The penis is uncircumcised. The scrotum is without lesions. The right epididymis is palpably normal and non-tender. The left epididymis is palpably normal and non-tender. The right testis is non-tender and without masses. The left testis is non-tender and without masses.  Lymphatics: The femoral and inguinal nodes are not enlarged or tender.  Skin: Normal skin turgor, no visible rash and no visible skin lesions.  Neuro/Psych:. Mood and affect are appropriate.    Results/Data Urine [Data Includes: Last 1 Day]   20Jan2017  COLOR YELLOW   APPEARANCE CLEAR   SPECIFIC GRAVITY 1.025   pH 5.0   GLUCOSE NEGATIVE   BILIRUBIN NEGATIVE   KETONE NEGATIVE   BLOOD TRACE   PROTEIN NEGATIVE   NITRITE NEGATIVE   LEUKOCYTE ESTERASE NEGATIVE   SQUAMOUS EPITHELIAL/HPF 0-5 HPF  WBC 0-5 WBC/HPF  RBC 0-2 RBC/HPF  BACTERIA NONE SEEN HPF  CRYSTALS NONE SEEN HPF  CASTS NONE SEEN LPF  Yeast NONE SEEN HPF  Selected Results  AU CT-HEMATURIA PROTOCOL [id number]  12:00AM Carolan Clines   Test Name Result Flag Reference  AU CT-HEMATURIA PROTOCOL (Report)    ** RADIOLOGY REPORT BY [facility] **   CLINICAL DATA: Microhematuria, BPH, elevated PSA. History of renal stones.  EXAM: CT ABDOMEN AND PELVIS WITHOUT AND WITH CONTRAST  TECHNIQUE: Multidetector CT imaging of the abdomen and pelvis was performed following the standard protocol before and following the bolus administration of intravenous contrast.  CONTRAST: 125 mL Isovue 300 IV  COMPARISON: None.  FINDINGS: Lower chest: Lung bases are notable for mild bronchiectasis with compressive atelectasis in the medial right lower lobe.  Hepatobiliary: Liver is within normal limits. No suspicious/enhancing hepatic lesions.  Gallbladder is unremarkable. No intrahepatic or extrahepatic ductal dilatation.  Pancreas: Within normal limits.  Spleen: Within normal limits.  Adrenals/Urinary Tract: Adrenal glands are within normal limits.  Scattered bilateral renal cysts, measuring up to 1.5 cm in the posterior right lower pole (series 8/ image 45). No enhancing renal lesions.  No renal, ureteral, or bladder calculi. No hydronephrosis.  On delayed imaging, there are no filling defects in the bilateral opacified proximal collecting systems, ureters, or bladder.  Bladder is mildly thick-walled although underdistended.  Stomach/Bowel: Stomach is within normal limits.  No evidence of bowel obstruction.  Normal appendix (series 4/image 67).  Colonic diverticulosis, without evidence of diverticulitis.  Vascular/Lymphatic: Atherosclerotic calcifications of the abdominal aorta and branch vessels. No evidence of abdominal aortic aneurysm.  No suspicious abdominopelvic lymphadenopathy.  Reproductive: Prostatomegaly, with enlargement of the central gland which indents  the base of the bladder.  Other: No abdominopelvic ascites.  Musculoskeletal: Degenerative changes of  the visualized thoracolumbar spine.  IMPRESSION: Bilateral renal cysts, measuring up to 1.5 cm in the right lower pole. No enhancing renal lesions.  No renal, ureteral, or bladder calculi. No hydronephrosis.  Bladder is mildly thick-walled although underdistended, possibly reflecting chronic bladder outlet obstruction.  Prostatomegaly, suggesting BPH.   Electronically Signed  By: Julian Hy M.D.  On: 06/08/2015 11:06   PSA REFLEX TO FREE [id number] 01:54PM Carolan Clines  SPECIMEN TYPE: BLOOD   Test Name Result Flag Reference  PSA 4.38 ng/mL H <=4.00  TEST METHODOLOGY: ECLIA PSA (ELECTROCHEMILUMINESCENCE IMMUNOASSAY)  PSA, FREE 1.68 ng/mL    PSA, %FREE 38 %  > 25  PROBABILITY OF PROSTATE CANCER   (FOR MEN WITH NON-SUSPICIOUS DRE RESULTS AND PSA BETWEEN 4 AND   10 NG/ML, BY PATIENT AGE)     % FREE PSA                          PATIENT AGE                          72 TO 59 YEARS  60 TO 69 YEARS  >70 YEARS    <=10%                  49.2%           57.5%          64.5%    11 - 18%               26.9%           33.9%          40.8%    19 - 25%               18.3%           23.9%          29.7%    >25%                    9.1%           12.2%          15.8%   CREATININE with eGFR [id number] 01:53PM Carolan Clines  SPECIMEN TYPE: BLOOD   Test Name Result Flag Reference  CREATININE 1.11 mg/dL  0.50-1.50  Est GFR, African American 77 mL/min  >=60  Est GFR, NonAfrican American 66 mL/min  >=60  THE ESTIMATED GFR IS A CALCULATION VALID FOR ADULTS (>=53 YEARS OLD) THAT USES THE CKD-EPI ALGORITHM TO ADJUST FOR AGE AND SEX. IT IS   NOT TO BE USED FOR CHILDREN, PREGNANT WOMEN, HOSPITALIZED PATIENTS,    PATIENTS ON DIALYSIS, OR WITH RAPIDLY CHANGING KIDNEY FUNCTION. ACCORDING TO THE NKDEP, EGFR >89 IS NORMAL, 60-89 SHOWS MILD IMPAIRMENT, 30-59 SHOWS MODERATE IMPAIRMENT, 15-29 SHOWS SEVERE IMPAIRMENT AND <15 IS ESRD.   Procedure  Procedure: Cystoscopy  Chaperone  Present: kim lewis1 .  Indication: Hematuria.  Informed Consent: Risks, benefits, and potential adverse events were discussed and informed consent was obtained from the patient.  Prep: The patient was prepped with betadine.  Anesthesia:. Local anesthesia was administered intraurethrally with 2% lidocaine jelly.  Antibiotic prophylaxis: Ciprofloxacin.  Procedure Note:  Urethral meatus:1 . No abnormalities1 .  Anterior urethra:1  No abnormalities1 .  Prostatic urethra:1 . The lateral and median prostatic lobes were enlarged1 .  An enlarged intravesical median lobe was visualized1 .  Bladder: The ureteral orifices1  were in the normal anatomic position bilaterally1 . Examination of the bladder demonstrated1  moderate1 , grade 21  trabeculation1  cellules1 .     1 Amended By: Carolan Clines; Jul 31 2015 4:43 PM EST  Assessment Assessed  1. Microscopic hematuria (R31.29) 2. Benign localized hyperplasia of prostate with urinary obstruction (N40.1,N13.8) 3. Nocturia (R35.1)   Severe BPH, Will need transvesical prostatectomy. We have discussed the eccentric growth of his prostate with large  intravesical 1  portion of the Right median lobe obstructing the bladder outlet. Although I do not think he has any cancer in the specimen, we will send it to Pathology for evaluation from surgery.   1 Amended By: Carolan Clines; Jul 31 2015 4:42 PM EST  Plan Benign localized hyperplasia of prostate with urinary obstruction  1. PT/OT Referral Referral  Referral  Status: Hold For - Appointment,PreCert,Date of  Service,Physical Therapy  Requested for: 20Jan2017 Health Maintenance  2. UA With REFLEX; [Do Not Release]; Status:Resulted - Requires Verification;   Done:  [id number] 02:33PM  1. Open, transvesical prostatectomy.  2. Pre-op PT evaluation.   Discussion/Summary Dr. Nyoka Cowden ( [facility])     Signatures Electronically signed by : Carolan Clines, M.D.; Jul 31 2015  4:43PM  EST  cc: Ruffin Frederick  Note: Diagnoses: 1. Obesity                              2. Panhypopituitarism: daily cortisone:   Pt will need cortisone coverage                              3. Hypogonadism                              4. Diabetes  ( A1c 5.8)                              5. CAD post angioplasty                              6. Hyperlipidemia

## 2015-08-13 NOTE — Anesthesia Procedure Notes (Signed)
Procedure Name: Intubation Date/Time: 08/13/2015 11:35 AM Performed by: Mechele Claude Pre-anesthesia Checklist: Patient identified, Emergency Drugs available, Suction available and Patient being monitored Patient Re-evaluated:Patient Re-evaluated prior to inductionOxygen Delivery Method: Circle System Utilized Preoxygenation: Pre-oxygenation with 100% oxygen Intubation Type: IV induction Ventilation: Mask ventilation without difficulty Laryngoscope Size: Mac and 4 Grade View: Grade II Tube type: Oral Tube size: 7.5 mm Number of attempts: 1 Airway Equipment and Method: Stylet Placement Confirmation: ETT inserted through vocal cords under direct vision,  positive ETCO2 and breath sounds checked- equal and bilateral Secured at: 21 cm Tube secured with: Tape Dental Injury: Teeth and Oropharynx as per pre-operative assessment

## 2015-08-13 NOTE — Progress Notes (Signed)
Dr. Gaynelle Arabian made aware of patient's condition- Foley catheter irrigated with a total of 100 cc Normal Saline- 50 cc returns- bloody-clots

## 2015-08-13 NOTE — Progress Notes (Signed)
Dr. Pilar Jarvis in to check patient.

## 2015-08-13 NOTE — Progress Notes (Signed)
Dr. Carlota Raspberry made aware of patient's urine output colors- red-pink-red-catheter draining better.

## 2015-08-13 NOTE — Anesthesia Preprocedure Evaluation (Signed)
Anesthesia Evaluation  Patient identified by MRN, date of birth, ID band Patient awake    Reviewed: Allergy & Precautions, NPO status , Patient's Chart, lab work & pertinent test results  History of Anesthesia Complications Negative for: history of anesthetic complications  Airway Mallampati: II  TM Distance: >3 FB Neck ROM: Full    Dental no notable dental hx. (+) Dental Advisory Given   Pulmonary former smoker,    Pulmonary exam normal breath sounds clear to auscultation       Cardiovascular hypertension, Pt. on medications + CAD, + Past MI and + Cardiac Stents  Normal cardiovascular exam Rhythm:Regular Rate:Normal  Cleared by cardiology   Neuro/Psych negative neurological ROS  negative psych ROS   GI/Hepatic negative GI ROS, Neg liver ROS,   Endo/Other  diabetesHypothyroidism Panhypopituitary, obesity  Renal/GU negative Renal ROS  negative genitourinary   Musculoskeletal  (+) Arthritis ,   Abdominal   Peds negative pediatric ROS (+)  Hematology negative hematology ROS (+)   Anesthesia Other Findings   Reproductive/Obstetrics negative OB ROS                             Anesthesia Physical Anesthesia Plan  ASA: III  Anesthesia Plan: General   Post-op Pain Management:    Induction: Intravenous  Airway Management Planned: Oral ETT  Additional Equipment:   Intra-op Plan:   Post-operative Plan: Extubation in OR  Informed Consent: I have reviewed the patients History and Physical, chart, labs and discussed the procedure including the risks, benefits and alternatives for the proposed anesthesia with the patient or authorized representative who has indicated his/her understanding and acceptance.   Dental advisory given  Plan Discussed with: CRNA  Anesthesia Plan Comments:         Anesthesia Quick Evaluation

## 2015-08-13 NOTE — Interval H&P Note (Signed)
History and Physical Interval Note:  08/13/2015 10:45 AM  Carl Gutierrez  has presented today for surgery, with the diagnosis of BENIGN PROSTATIC HYPERPLASIA WITH LARGE VESICAL LOBE RIGHT SIDE  The various methods of treatment have been discussed with the patient and family. After consideration of risks, benefits and other options for treatment, the patient has consented to  Procedure(s): TRANSVESICLE OPEN PROSTATECTOMY**  (N/A) as a surgical intervention .  The patient's history has been reviewed, patient examined, no change in status, stable for surgery.  I have reviewed the patient's chart and labs.  Questions were answered to the patient's satisfaction.   Note: Pt has a hx of DVT, angioplasty ( remote): will need SCD and Lovenox post op; and hx of panhypopit, and will need consideration of cortisone coverage.; DM, with A1c 5.8.  Adekunle Rohrbach I Ieesha Abbasi

## 2015-08-13 NOTE — Progress Notes (Signed)
O.K. To go to floor per Dr. Pilar Jarvis

## 2015-08-13 NOTE — Op Note (Signed)
Preoperative diagnosis:  1. BPH  Postoperative diagnosis: 1. BPH  Procedure(s): 1. Open suprapubic simple prostatectomy  Surgeon: Dr. Gaynelle Arabian, MD Resident: Dr. Carlota Raspberry, MD  Anesthesia: General  Complications: None  EBL: 100 ml  Specimens: Prostate adenoma  Disposition of specimens: Pathology  Intraoperative findings: The patient was found to have a moderate sized prostate adenoma. Stress dose steroids were provided prior to starting the surgery.  Indication: 70 y.o. male with h/o BPH and urinary retention here for simple prostatectomy.   Description of procedure:  The patient was brought into the OR and placed in supine position. Anesthesia was induced and an ET tube placed. Stress dose steroids of 100 mg hydrocortisone was provided and antibiotics were administered. A time-out was performed prior to incision.  We began with a low midline incision from the bupis to below the umbilicus. This was carried down to the midline fascia which was entered and the muscle beneath was split easily in the midline exposing the bladder and peritoneum. The bladder was dissected out laterally and to about the level of the prostatic-vesicle junction. The omni retractor was placed for exposure.   The bladder was filled and a stay suture of 2-o vicryl was placed at the junction of the prostate and bladder. 2 more stay sutures were placed at about the middle of the bladder and the bladder was opened between these sutures in a vertical fashion. The omni was used to keep the bladder open with wet lap sponges to protect the mucosa.  The prostate was entered in a semi-circular fashion after clearly identifying the UOs bilaterally. The capsul was opened with electrocautery and  Blunt finger dissection was used to  Remove the adenoma, which came out in 1 intact specimen after finger dissecting out the urethral portion.  The prostatic bed was irrigated and 2 interrupted 2-0 vicryl sutures were used at the  5 and 7 o'clock position to both bring the bladder neck into the fossa and allow for hemostasis which was excellent after placement of the sutures. Surgiflow hemostatic agent was placed in the bed and pressure was maintained with a lap sponge over the hemostatic agent for 3 minutes by the clock. After this hemostasis was confirmed. The final 20 F 3-way foley was placed with 30 cc's in the balloon and placed on a light traction.   The bladder was then closed in 2 layers starting with the previously placed 2-0 vicryl suture for the mucosal layer and the a sero-muscular layer over this with 2-0 vicryl. The bladder was then filled to confirm no leaks were present. A JP drain was placed with a separate stab incision in the LLQ and the drain placed in the space of retzius. 40 ml of Experil was place in the fascial and skin layers for local anesthetic.   The fascia was closed with 0 looped PDS meeting in the middle. The skin was closed with staples and an island dressing placed. All counts were correct. The patient was awoken from anesthesia and transferred to the PACU for post op care. With the foley draining to gravity and not on CBI.      Urology Attending Note: I was present for, and participated in , all aspects of this patient's surgical care.

## 2015-08-13 NOTE — Progress Notes (Signed)
Jiles Crocker, NP called - to locate Dr. Pilar Jarvis

## 2015-08-13 NOTE — Progress Notes (Signed)
CBC and B Met drawn by lab.

## 2015-08-13 NOTE — Progress Notes (Signed)
Dr. Pilar Jarvis in to see patient-made aware of patient's condition- irrigated Foley catheter-had difficult time- Foley catheter balloon deflated- catheter manipulated-balloon reinflated- catheter irrigated with Normal Saline- Foley catheter draining well- urine light red in color- CBI infusing well- patient felt much better after this was done

## 2015-08-13 NOTE — Progress Notes (Signed)
Upon arrival to PACU- Foley catheter to straight drainage- urine grossly bloody -clots noted-draining very little

## 2015-08-14 ENCOUNTER — Encounter (HOSPITAL_COMMUNITY): Payer: Self-pay | Admitting: Urology

## 2015-08-14 ENCOUNTER — Inpatient Hospital Stay (HOSPITAL_COMMUNITY): Payer: Medicare Other

## 2015-08-14 LAB — CBC
HCT: 37.1 % — ABNORMAL LOW (ref 39.0–52.0)
HCT: 34.3 % — ABNORMAL LOW (ref 39.0–52.0)
Hemoglobin: 12.2 g/dL — ABNORMAL LOW (ref 13.0–17.0)
Hemoglobin: 11.3 g/dL — ABNORMAL LOW (ref 13.0–17.0)
MCH: 30.2 pg (ref 26.0–34.0)
MCH: 30.6 pg (ref 26.0–34.0)
MCHC: 32.9 g/dL (ref 30.0–36.0)
MCHC: 32.9 g/dL (ref 30.0–36.0)
MCV: 91.8 fL (ref 78.0–100.0)
MCV: 93 fL (ref 78.0–100.0)
Platelets: 245 10*3/uL (ref 150–400)
Platelets: 243 10*3/uL (ref 150–400)
RBC: 4.04 MIL/uL — ABNORMAL LOW (ref 4.22–5.81)
RBC: 3.69 MIL/uL — ABNORMAL LOW (ref 4.22–5.81)
RDW: 13.5 % (ref 11.5–15.5)
RDW: 13.6 % (ref 11.5–15.5)
WBC: 13.3 10*3/uL — ABNORMAL HIGH (ref 4.0–10.5)
WBC: 13 10*3/uL — ABNORMAL HIGH (ref 4.0–10.5)

## 2015-08-14 LAB — BASIC METABOLIC PANEL
Anion gap: 8 (ref 5–15)
BUN: 20 mg/dL (ref 6–20)
CO2: 23 mmol/L (ref 22–32)
Calcium: 7.9 mg/dL — ABNORMAL LOW (ref 8.9–10.3)
Chloride: 105 mmol/L (ref 101–111)
Creatinine, Ser: 1.55 mg/dL — ABNORMAL HIGH (ref 0.61–1.24)
GFR calc Af Amer: 50 mL/min — ABNORMAL LOW (ref >60)
GFR calc non Af Amer: 43 mL/min — ABNORMAL LOW (ref >60)
Glucose, Bld: 133 mg/dL — ABNORMAL HIGH (ref 65–99)
Potassium: 4.3 mmol/L (ref 3.5–5.1)
Sodium: 136 mmol/L (ref 135–145)

## 2015-08-14 LAB — GLUCOSE, CAPILLARY
Glucose-Capillary: 126 mg/dL — ABNORMAL HIGH (ref 65–99)
Glucose-Capillary: 126 mg/dL — ABNORMAL HIGH (ref 65–99)
Glucose-Capillary: 145 mg/dL — ABNORMAL HIGH (ref 65–99)
Glucose-Capillary: 152 mg/dL — ABNORMAL HIGH (ref 65–99)

## 2015-08-14 MED ORDER — BACITRACIN-NEOMYCIN-POLYMYXIN 400-5-5000 EX OINT: 1.0000 "application " | TOPICAL_OINTMENT | Freq: Three times a day (TID) | CUTANEOUS | Status: DC | PRN

## 2015-08-14 MED ORDER — ACETAMINOPHEN 325 MG PO TABS
325.0000 mg | ORAL_TABLET | ORAL | Status: DC
  Administered 2015-08-14: 325 mg via ORAL
  Filled 2015-08-14 (×2): qty 1

## 2015-08-14 MED ORDER — HEPARIN SODIUM (PORCINE) 5000 UNIT/ML IJ SOLN
5000.0000 [IU] | Freq: Three times a day (TID) | INTRAMUSCULAR | Status: DC
  Administered 2015-08-14 – 2015-08-16 (×7): 5000 [IU] via SUBCUTANEOUS
  Filled 2015-08-14 (×7): qty 1

## 2015-08-14 MED ORDER — VANCOMYCIN HCL 10 G IV SOLR: 1250.0000 mg | INTRAVENOUS | Status: DC

## 2015-08-14 MED ORDER — VANCOMYCIN HCL 10 G IV SOLR
2000.0000 mg | Freq: Once | INTRAVENOUS | Status: AC
  Administered 2015-08-14: 2000 mg via INTRAVENOUS
  Filled 2015-08-14: qty 2000

## 2015-08-14 NOTE — Progress Notes (Signed)
Contact made with Dr. Arlyn Leak office spoke with NP Dianne Dun regarding patient elevated temp > than  102. Mingo Amber gave TVO with Readback for Tylenol 325mg  PO, Stat CXR, Blood Culture x 2, CBC,  Pharm consult Vancomycin dosing.

## 2015-08-14 NOTE — Progress Notes (Signed)
Pharmacy Antibiotic Note  Carl Gutierrez is a 70 y.o. male admitted on 08/13/2015 with fever.  Pharmacy has been consulted for Vancomcyin dosing. Patient underwent prostatectomy on 2/27.  Received Cefazolin 1gm IV q8h x 2 doses (last dose 2/28 @ 0422.  Patient now febrile (102.28F)  Plan:  Vancomycin 2gm IV x 1 followed by 1250mg  IV q24h  F/U cultures/sensitivities  Check Vancomycin Trough level when appropriate  Height: 5\' 10"  (177.8 cm) Weight: 223 lb (101.152 kg) IBW/kg (Calculated) : 73  Temp (24hrs), Avg:99.6 F (37.6 C), Min:98.2 F (36.8 C), Max:102.1 F (38.9 C)   Recent Labs Lab 08/13/15 1458 08/14/15 0424  WBC 11.0* 13.3*  CREATININE 1.43* 1.55*    Estimated Creatinine Clearance: 52.1 mL/min (by C-G formula based on Cr of 1.55).    Allergies  Allergen Reactions  . Hydrochlorothiazide     Leg and side pain with this  . Other     Blood pressure med name unknown.    Antimicrobials this admission: 2/27 Cefazolin >> 2/28 2/28 Vanc >>    Dose adjustments this admission:   Goal: Vancomycin trough:15-20 mcg/ml  Microbiology results: 2/28 BCx:    Thank you for allowing pharmacy to be a part of this patient's care.  Everette Rank, PharmD 08/14/2015 9:36 PM

## 2015-08-14 NOTE — Telephone Encounter (Signed)
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 Patient must bring paperwork with him to [facility], and updated CPE paperwork with amendment can be sent to [facility], as well as scanned into our records.

## 2015-08-14 NOTE — Progress Notes (Signed)
UROLOGY PROGRESS NOTES  Assessment/Plan: Carl Gutierrez is a 70 y.o. male POD 0 status post a open simple prostatectomy. Currently stable.  -Continue current pain regimen  -regular diet -med lock -Foley to drainage - SQ heparin - stress dose steroids -Monitor Is and Os -shower and antibiotic ointment to glans/catheter.   Subjective: Recovering on the floor. Pain controlled on PO medication with IV breakthrough. Denies N/V. Foley draining clear urine without clot on slow CBI. JP draining SS drainage, 40 cc overnight.  Post-op Hb  Lab Results  Component Value Date   HGB 12.2* 08/14/2015     Objective:  Vital signs in last 24 hours: Filed Vitals:   08/13/15 1845 08/14/15 0431  BP: 126/81 105/84  Pulse: 75 89  Temp: 97.4 F (36.3 C) 98.2 F (36.8 C)  Resp: 18 18     Intake/Output last 24 hours:  Intake/Output Summary (Last 24 hours) at 08/14/15 0732 Last data filed at 08/14/15 [id number]  Gross per 24 hour  Intake 14936.67 ml  Output  13170 ml  Net 1766.67 ml     Physical Exam: General: No acute distress Pulmonary: Normal work of breathing Cardiovascular: Pulse regular rate and rhythm Abdomen: Soft, nontender, nondistended, incision c/d/i Foley: secure, draining clear urine without clot. No blood on foley.  Extremities: Warm and well perfused Neuro: Intact, no obvious deficits  Data Review: WBC      Lab Results  Component Value Date   WBC 13.3* 08/14/2015   HGB 12.2* 08/14/2015   HCT 37.1* 08/14/2015   PLT 245 08/14/2015     Lab Results  Component Value Date   NA 136 08/14/2015   K 4.3 08/14/2015   CL 105 08/14/2015   CO2 23 08/14/2015   BUN 20 08/14/2015   CREATININE 1.55* 08/14/2015   CALCIUM 7.9* 08/14/2015   MG 1.8 12/15/2009   Urology Attending Note: Pt seen and examined this AM. Minimal pain. + walking already.  Catheter was opulled down into prostatic fossa last evening, and deflated and pushed back into bladder and re-inflated by Dr. Pilar Jarvis.  Draining serosanguinous urine overnight. JP mild bloody drainage. Agree with plans ( above). Will follow Cr, glucose, hgb.

## 2015-08-14 NOTE — Progress Notes (Signed)
Pt ambulated hall post -op day 1. During ambulation pt O2 Saturation dropped in to low to mid 19' s while on room air. Pt denied dizziness, SOB, and remain asymptomatic.

## 2015-08-15 ENCOUNTER — Ambulatory Visit: Payer: Medicare Other

## 2015-08-15 LAB — CBC WITH DIFFERENTIAL/PLATELET
Basophils Absolute: 0 10*3/uL (ref 0.0–0.1)
Basophils Relative: 0 %
Eosinophils Absolute: 0.1 10*3/uL (ref 0.0–0.7)
Eosinophils Relative: 1 %
HCT: 33 % — ABNORMAL LOW (ref 39.0–52.0)
Hemoglobin: 10.9 g/dL — ABNORMAL LOW (ref 13.0–17.0)
Lymphocytes Relative: 14 %
Lymphs Abs: 1.6 10*3/uL (ref 0.7–4.0)
MCH: 30.9 pg (ref 26.0–34.0)
MCHC: 33 g/dL (ref 30.0–36.0)
MCV: 93.5 fL (ref 78.0–100.0)
Monocytes Absolute: 1.6 10*3/uL — ABNORMAL HIGH (ref 0.1–1.0)
Monocytes Relative: 14 %
Neutro Abs: 7.8 10*3/uL — ABNORMAL HIGH (ref 1.7–7.7)
Neutrophils Relative %: 71 %
Platelets: 247 10*3/uL (ref 150–400)
RBC: 3.53 MIL/uL — ABNORMAL LOW (ref 4.22–5.81)
RDW: 13.8 % (ref 11.5–15.5)
WBC: 11.1 10*3/uL — ABNORMAL HIGH (ref 4.0–10.5)

## 2015-08-15 LAB — GLUCOSE, CAPILLARY
Glucose-Capillary: 133 mg/dL — ABNORMAL HIGH (ref 65–99)
Glucose-Capillary: 130 mg/dL — ABNORMAL HIGH (ref 65–99)
Glucose-Capillary: 143 mg/dL — ABNORMAL HIGH (ref 65–99)
Glucose-Capillary: 132 mg/dL — ABNORMAL HIGH (ref 65–99)

## 2015-08-15 LAB — PROTIME-INR
INR: 1.18 (ref 0.00–1.49)
Prothrombin Time: 15.2 s (ref 11.6–15.2)

## 2015-08-15 LAB — BASIC METABOLIC PANEL
Anion gap: 6 (ref 5–15)
BUN: 17 mg/dL (ref 6–20)
CO2: 25 mmol/L (ref 22–32)
Calcium: 7.9 mg/dL — ABNORMAL LOW (ref 8.9–10.3)
Chloride: 107 mmol/L (ref 101–111)
Creatinine, Ser: 1.4 mg/dL — ABNORMAL HIGH (ref 0.61–1.24)
GFR calc Af Amer: 57 mL/min — ABNORMAL LOW (ref >60)
GFR calc non Af Amer: 49 mL/min — ABNORMAL LOW (ref >60)
Glucose, Bld: 137 mg/dL — ABNORMAL HIGH (ref 65–99)
Potassium: 4.2 mmol/L (ref 3.5–5.1)
Sodium: 138 mmol/L (ref 135–145)

## 2015-08-15 LAB — CREATININE, FLUID (PLEURAL, PERITONEAL, JP DRAINAGE): Creat, Fluid: 21.2 mg/dL

## 2015-08-15 MED ORDER — HYDROCORTISONE 10 MG PO TABS
10.0000 mg | ORAL_TABLET | Freq: Every evening | ORAL | Status: DC
  Administered 2015-08-15: 10 mg via ORAL
  Filled 2015-08-15 (×2): qty 1

## 2015-08-15 MED ORDER — SENNA 8.6 MG PO TABS: 1.0000 | ORAL_TABLET | Freq: Two times a day (BID) | ORAL | Status: DC

## 2015-08-15 MED ORDER — DOCUSATE SODIUM 100 MG PO CAPS: 100.0000 mg | ORAL_CAPSULE | Freq: Two times a day (BID) | ORAL | Status: DC

## 2015-08-15 MED ORDER — SULFAMETHOXAZOLE-TRIMETHOPRIM 800-160 MG PO TABS: 1.0000 | ORAL_TABLET | Freq: Two times a day (BID) | ORAL | Status: DC

## 2015-08-15 MED ORDER — ACETAMINOPHEN 325 MG PO TABS
325.0000 mg | ORAL_TABLET | ORAL | Status: DC | PRN
Start: 2015-08-15 — End: 2015-08-16

## 2015-08-15 MED ORDER — HYDROCORTISONE 20 MG PO TABS
20.0000 mg | ORAL_TABLET | Freq: Every day | ORAL | Status: DC
  Administered 2015-08-15 – 2015-08-16 (×2): 20 mg via ORAL
  Filled 2015-08-15 (×2): qty 1

## 2015-08-15 MED ORDER — OXYCODONE-ACETAMINOPHEN 5-325 MG PO TABS: 1.0000 | ORAL_TABLET | ORAL | Status: DC | PRN

## 2015-08-15 NOTE — Anesthesia Postprocedure Evaluation (Signed)
Anesthesia Post Note  Patient: Carl Gutierrez  Procedure(s) Performed: Procedure(s) (LRB): TRANSVESICLE OPEN PROSTATECTOMY**  (N/A)  Anesthesia Type: General Level of consciousness: awake and alert Pain management: pain level controlled Vital Signs Assessment: post-procedure vital signs reviewed and stable Respiratory status: spontaneous breathing, nonlabored ventilation, respiratory function stable and patient connected to nasal cannula oxygen Cardiovascular status: blood pressure returned to baseline and stable Postop Assessment: no signs of nausea or vomiting Anesthetic complications: no    Last Vitals:  Filed Vitals:   08/15/15 0450 08/15/15 1528  BP: 144/68 151/73  Pulse: 72 68  Temp: 37.3 C 37.1 C  Resp: 18 18    Last Pain:  Filed Vitals:   08/15/15 2023  PainSc: 2                  Ruba Outen JENNETTE

## 2015-08-15 NOTE — Progress Notes (Signed)
Urology Progress Note  2 Days Post-Op   Subjective: 1. Overnight fever 102. Chest clear.    O: CXR: atelectasis vs early pneumonia. O2 sats reported to have dipped overnight. On-call NP ordered vanc. coverage   A: Pt has been walking, and using incentive spirometer, but will need more aggressive IS. O2 sats dipped overnight.  P: aggressive IS. Ck CBC, B-met this AM  2.  DM: increased BS: ? 2nd pneumonic process        P: ck A1c. May need IM consult.  3. Post op open prostatectomy:     O: on SCD and anticoagulant protocol 2ndary remote hx of LLE DVT.          Exam: Neg Homan's sign. No evidence of phlebitis. JP 70cc, bloody.          Foley bloody-clears with CBI. No clots.     . Ambulation:   positive Flatus:    positive Bowel movement  negative  Pain: some relief  Objective:  Blood pressure 144/68, pulse 72, temperature 99.2 F (37.3 C), temperature source Oral, resp. rate 18, height _0  (1.778 m), weight 101.152 kg (223 lb), SpO2 95 %.  Physical Exam:  General:  No acute distress, awake Resp: clear to auscultation bilaterally Genitourinary:  Bloody urine , clears with CBI Foley: no clots. JP 70 cc.     I/O last 3 completed shifts: In: 12026.7 [P.O.:600; I.V.:3176.7; Other:8200; IV Piggyback:50] Out: 2620 [Urine:8900; Drains:200]  Recent Labs     08/14/15  0424  08/14/15  2218  HGB  12.2*  11.3*  WBC  13.3*  13.0*  PLT  245  243    Recent Labs     08/14/15  0424  08/15/15  0433  NA  136  138  K  4.3  4.2  CL  105  107  CO2  23  25  BUN  20  17  CREATININE  1.55*  1.40*  CALCIUM  7.9*  7.9*  GFRNONAA  43*  49*  GFRAA  50*  57*     Recent Labs     08/15/15  0433  INR  1.18      Assessment/Plan:  Continue any current medications. Repeat CXR in AM.  Review antibiotic.  Watch O2sat.

## 2015-08-16 LAB — CBC
HCT: 31.9 % — ABNORMAL LOW (ref 39.0–52.0)
Hemoglobin: 10.6 g/dL — ABNORMAL LOW (ref 13.0–17.0)
MCH: 30.6 pg (ref 26.0–34.0)
MCHC: 33.2 g/dL (ref 30.0–36.0)
MCV: 92.2 fL (ref 78.0–100.0)
Platelets: 272 10*3/uL (ref 150–400)
RBC: 3.46 MIL/uL — ABNORMAL LOW (ref 4.22–5.81)
RDW: 13.4 % (ref 11.5–15.5)
WBC: 12.5 10*3/uL — ABNORMAL HIGH (ref 4.0–10.5)

## 2015-08-16 LAB — BASIC METABOLIC PANEL
Anion gap: 6 (ref 5–15)
BUN: 13 mg/dL (ref 6–20)
CO2: 26 mmol/L (ref 22–32)
Calcium: 8.1 mg/dL — ABNORMAL LOW (ref 8.9–10.3)
Chloride: 106 mmol/L (ref 101–111)
Creatinine, Ser: 1.16 mg/dL (ref 0.61–1.24)
GFR calc Af Amer: >60 mL/min (ref >60)
GFR calc non Af Amer: >60 mL/min (ref >60)
Glucose, Bld: 144 mg/dL — ABNORMAL HIGH (ref 65–99)
Potassium: 4.3 mmol/L (ref 3.5–5.1)
Sodium: 138 mmol/L (ref 135–145)

## 2015-08-16 LAB — GLUCOSE, CAPILLARY
Glucose-Capillary: 118 mg/dL — ABNORMAL HIGH (ref 65–99)
Glucose-Capillary: 117 mg/dL — ABNORMAL HIGH (ref 65–99)

## 2015-08-16 LAB — HEMOGLOBIN A1C
Hgb A1c MFr Bld: 5.9 % — ABNORMAL HIGH (ref 4.8–5.6)
Mean Plasma Glucose: 123 mg/dL

## 2015-08-16 NOTE — Care Management Important Message (Signed)
Important Message  Patient Details  Name: Carl Gutierrez MRN: [id number] Date of Birth: [birth year]   Medicare Important Message Given:  Yes    Camillo Flaming 08/16/2015, 1:07 [place] Message  Patient Details  Name: Carl Gutierrez MRN: [id number] Date of Birth: 08/09/1945   Medicare Important Message Given:  Yes    Camillo Flaming 08/16/2015, 1:07 PM

## 2015-08-16 NOTE — Discharge Summary (Signed)
Date of admission: 08/13/2015  Date of discharge: 08/16/2015  Admission diagnosis: BPH  Discharge diagnosis: Same  Secondary diagnoses:   History and Physical: For full details, please see admission history and physical. Briefly, Carl Gutierrez is a 70 y.o. year old patient with BPH.   Hospital Course: The patient underwent an open simple prostatectomy on 08/13/15. The patient was started on CBI and had stress dose steroids for chronic steroid use. He tolerated the procedure well and was transfere to the floor. He was started on Heparin ppx on POD#1 for previous DVT. On POD#1 his diet was advanced, and his pain was controlled. A JP creatinine was sent POD#2 and was positive for urine. The patient was sent home POD#3 with foley and JP in place. The patient will record JP volumes at home and will return 2 weeks post op for cystogram and foley removal.  Laboratory values:  Recent Labs  08/14/15 2218 08/15/15 0800 08/16/15 0504  HGB 11.3* 10.9* 10.6*  HCT 34.3* 33.0* 31.9*    Recent Labs  08/15/15 0433 08/16/15 0504  CREATININE 1.40* 1.16    Disposition: Home  Discharge instruction: The patient was instructed to be ambulatory but told to refrain from heavy lifting, strenuous activity, or driving.   Discharge medications:    Medication List    TAKE these medications        amLODipine 10 MG tablet  Commonly known as:  NORVASC  TAKE 1 TABLET (10 MG TOTAL) BY MOUTH DAILY.     aspirin 81 MG tablet  Take 81 mg by mouth daily.     docusate sodium 100 MG capsule  Commonly known as:  COLACE  Take 1 capsule (100 mg total) by mouth 2 (two) times daily.     hydrocortisone 20 MG tablet  Commonly known as:  CORTEF  Take 10-20 mg by mouth 2 (two) times daily. Takes 1 tablet in the morning and 1/2 tablet at night     levothyroxine 88 MCG tablet  Commonly known as:  SYNTHROID, LEVOTHROID  TAKE 1 TABLET (88 MCG TOTAL) BY MOUTH DAILY.     lisinopril 20 MG tablet  Commonly known as:   PRINIVIL,ZESTRIL  TAKE 1 TABLET (20 MG TOTAL) BY MOUTH DAILY.     metFORMIN 500 MG tablet  Commonly known as:  GLUCOPHAGE  TAKE 3 TABLETS WITH EVENING MEAL DAILY     metoprolol succinate 25 MG 24 hr tablet  Commonly known as:  TOPROL-XL  Take 1 tablet (25 mg total) by mouth daily. Start with one half pill per day for 1 week, then can increase to one pill per day.     oxyCODONE-acetaminophen 5-325 MG tablet  Commonly known as:  PERCOCET/ROXICET  Take 1-2 tablets by mouth every 4 (four) hours as needed for moderate pain.     senna 8.6 MG Tabs tablet  Commonly known as:  SENOKOT  Take 1 tablet (8.6 mg total) by mouth 2 (two) times daily.     simvastatin 40 MG tablet  Commonly known as:  ZOCOR  Take 20 mg by mouth every evening.     sulfamethoxazole-trimethoprim 800-160 MG tablet  Commonly known as:  BACTRIM DS,SEPTRA DS  Take 1 tablet by mouth 2 (two) times daily. Start taking this the day before your catheter comes out.     Vitamin D3 2000 units Tabs  Take 2,000 Units by mouth.        Followup:  2 weeks Urology Attending note: Pt seen and examined and I  agree with Dr. Vonna Kotyk discharge plans.

## 2015-08-16 NOTE — Progress Notes (Signed)
Patient and his wife given discharge, medication and follow up instructions, verbalized understanding, IV removed, prescription given, family to transport home.

## 2015-08-16 NOTE — Progress Notes (Signed)
UROLOGY PROGRESS NOTES  Assessment/Plan: Carl Gutierrez is a 70 y.o. male 3 Days Post-Op status post a open simple prostatectomy. Currently stable. JP creatinine positive on 08/15/15 with CBI running and increased JP output.   -Continue current pain regimen  -regular diet -med lock -Foley to drainage, no CBI - SQ heparin - continue home steroids -Monitor Is and Os -shower and antibiotic ointment to glans/catheter.   Subjective: Recovering on the floor. Pain controlled on PO medication with IV breakthrough. Denies N/V.   Post-op Hb  Lab Results  Component Value Date   HGB 10.6* 08/16/2015     Objective:  Vital signs in last 24 hours: Filed Vitals:   08/15/15 2144 08/16/15 0535  BP: 143/72 140/77  Pulse: 71 76  Temp: 98.7 F (37.1 C) 98.5 F (36.9 C)  Resp: 18 18     Intake/Output last 24 hours:  Intake/Output Summary (Last 24 hours) at 08/16/15 0729 Last data filed at 08/16/15 [id number]  Gross per 24 hour  Intake  16930 ml  Output  20730 ml  Net  -3800 ml     Physical Exam: General: No acute distress Pulmonary: Normal work of breathing Cardiovascular: Pulse regular rate and rhythm Abdomen: Soft, nontender, nondistended, incision c/d/i Foley: secure, draining clear urine without clot. Extremities: Warm and well perfused Neuro: Intact, no obvious deficits  Data Review: WBC      Lab Results  Component Value Date   WBC 12.5* 08/16/2015   HGB 10.6* 08/16/2015   HCT 31.9* 08/16/2015   PLT 272 08/16/2015     Lab Results  Component Value Date   NA 138 08/16/2015   K 4.3 08/16/2015   CL 106 08/16/2015   CO2 26 08/16/2015   BUN 13 08/16/2015   CREATININE 1.16 08/16/2015   CALCIUM 8.1* 08/16/2015   MG 1.8 12/15/2009  Urology Attending Note: Pt seen and examined this AM. Agree with Drl Carlota Raspberry. Pt has urine per JP, and may be able to be discharged if he is able to drain his own JP drain.

## 2015-08-20 LAB — CULTURE, BLOOD (ROUTINE X 2)
Culture: NO GROWTH
Culture: NO GROWTH

## 2015-08-22 ENCOUNTER — Other Ambulatory Visit: Payer: Medicare Other

## 2015-08-24 ENCOUNTER — Telehealth: Payer: Self-pay | Admitting: Family Medicine

## 2015-08-24 ENCOUNTER — Other Ambulatory Visit (HOSPITAL_COMMUNITY): Payer: Self-pay | Admitting: Urology

## 2015-08-24 ENCOUNTER — Encounter (HOSPITAL_COMMUNITY): Payer: Self-pay | Admitting: Emergency Medicine

## 2015-08-24 ENCOUNTER — Ambulatory Visit (HOSPITAL_BASED_OUTPATIENT_CLINIC_OR_DEPARTMENT_OTHER)
Admission: RE | Admit: 2015-08-24 | Discharge: 2015-08-24 | Disposition: A | Discharge status: Home / Self Care | Payer: Medicare Other | Source: Ambulatory Visit | Attending: Urology | Admitting: Urology

## 2015-08-24 ENCOUNTER — Emergency Department (HOSPITAL_COMMUNITY)
Admission: EM | Admit: 2015-08-24 | Discharge: 2015-08-24 | Disposition: A | Discharge status: Home / Self Care | Payer: Medicare Other | Attending: Emergency Medicine | Admitting: Emergency Medicine

## 2015-08-24 DIAGNOSIS — E039 Hypothyroidism, unspecified: Secondary | ICD-10-CM | POA: Diagnosis not present

## 2015-08-24 DIAGNOSIS — I82403 Acute embolism and thrombosis of unspecified deep veins of lower extremity, bilateral: Secondary | ICD-10-CM

## 2015-08-24 DIAGNOSIS — Z7982 Long term (current) use of aspirin: Secondary | ICD-10-CM | POA: Diagnosis not present

## 2015-08-24 DIAGNOSIS — M47817 Spondylosis without myelopathy or radiculopathy, lumbosacral region: Secondary | ICD-10-CM | POA: Diagnosis not present

## 2015-08-24 DIAGNOSIS — Z7984 Long term (current) use of oral hypoglycemic drugs: Secondary | ICD-10-CM | POA: Insufficient documentation

## 2015-08-24 DIAGNOSIS — M79662 Pain in left lower leg: Secondary | ICD-10-CM

## 2015-08-24 DIAGNOSIS — N401 Enlarged prostate with lower urinary tract symptoms: Secondary | ICD-10-CM | POA: Diagnosis not present

## 2015-08-24 DIAGNOSIS — M7989 Other specified soft tissue disorders: Secondary | ICD-10-CM | POA: Diagnosis not present

## 2015-08-24 DIAGNOSIS — M79605 Pain in left leg: Secondary | ICD-10-CM

## 2015-08-24 DIAGNOSIS — I10 Essential (primary) hypertension: Secondary | ICD-10-CM | POA: Insufficient documentation

## 2015-08-24 DIAGNOSIS — I824Z3 Acute embolism and thrombosis of unspecified deep veins of distal lower extremity, bilateral: Secondary | ICD-10-CM | POA: Diagnosis not present

## 2015-08-24 DIAGNOSIS — E785 Hyperlipidemia, unspecified: Secondary | ICD-10-CM | POA: Diagnosis not present

## 2015-08-24 DIAGNOSIS — R2242 Localized swelling, mass and lump, left lower limb: Secondary | ICD-10-CM | POA: Diagnosis present

## 2015-08-24 DIAGNOSIS — Z9861 Coronary angioplasty status: Secondary | ICD-10-CM | POA: Diagnosis not present

## 2015-08-24 DIAGNOSIS — Z9889 Other specified postprocedural states: Secondary | ICD-10-CM | POA: Insufficient documentation

## 2015-08-24 DIAGNOSIS — E119 Type 2 diabetes mellitus without complications: Secondary | ICD-10-CM | POA: Diagnosis not present

## 2015-08-24 DIAGNOSIS — Z87891 Personal history of nicotine dependence: Secondary | ICD-10-CM | POA: Insufficient documentation

## 2015-08-24 DIAGNOSIS — Z7952 Long term (current) use of systemic steroids: Secondary | ICD-10-CM | POA: Diagnosis not present

## 2015-08-24 DIAGNOSIS — N138 Other obstructive and reflux uropathy: Secondary | ICD-10-CM | POA: Diagnosis not present

## 2015-08-24 DIAGNOSIS — Z79899 Other long term (current) drug therapy: Secondary | ICD-10-CM | POA: Diagnosis not present

## 2015-08-24 DIAGNOSIS — R936 Abnormal findings on diagnostic imaging of limbs: Secondary | ICD-10-CM

## 2015-08-24 LAB — CBC
HCT: 32.5 % — ABNORMAL LOW (ref 39.0–52.0)
Hemoglobin: 10.4 g/dL — ABNORMAL LOW (ref 13.0–17.0)
MCH: 30.1 pg (ref 26.0–34.0)
MCHC: 32 g/dL (ref 30.0–36.0)
MCV: 94.2 fL (ref 78.0–100.0)
Platelets: 615 10*3/uL — ABNORMAL HIGH (ref 150–400)
RBC: 3.45 MIL/uL — ABNORMAL LOW (ref 4.22–5.81)
RDW: 13.7 % (ref 11.5–15.5)
WBC: 12.2 10*3/uL — ABNORMAL HIGH (ref 4.0–10.5)

## 2015-08-24 LAB — COMPREHENSIVE METABOLIC PANEL
ALT: 41 U/L (ref 17–63)
AST: 27 U/L (ref 15–41)
Albumin: 3.5 g/dL (ref 3.5–5.0)
Alkaline Phosphatase: 58 U/L (ref 38–126)
Anion gap: 12 (ref 5–15)
BUN: 21 mg/dL — ABNORMAL HIGH (ref 6–20)
CO2: 21 mmol/L — ABNORMAL LOW (ref 22–32)
Calcium: 8.6 mg/dL — ABNORMAL LOW (ref 8.9–10.3)
Chloride: 107 mmol/L (ref 101–111)
Creatinine, Ser: 1.73 mg/dL — ABNORMAL HIGH (ref 0.61–1.24)
GFR calc Af Amer: 44 mL/min — ABNORMAL LOW (ref >60)
GFR calc non Af Amer: 38 mL/min — ABNORMAL LOW (ref >60)
Glucose, Bld: 131 mg/dL — ABNORMAL HIGH (ref 65–99)
Potassium: 4.2 mmol/L (ref 3.5–5.1)
Sodium: 140 mmol/L (ref 135–145)
Total Bilirubin: 0.5 mg/dL (ref 0.3–1.2)
Total Protein: 7.1 g/dL (ref 6.5–8.1)

## 2015-08-24 LAB — PROTIME-INR
INR: 1.28 (ref 0.00–1.49)
Prothrombin Time: 15.6 seconds — ABNORMAL HIGH (ref 11.6–15.2)

## 2015-08-24 MED ORDER — APIXABAN 5 MG PO TABS: 10.0000 mg | ORAL_TABLET | Freq: Two times a day (BID) | ORAL | Status: DC

## 2015-08-24 MED ORDER — APIXABAN (ELIQUIS) EDUCATION KIT FOR DVT/PE PATIENTS: 1.0000 | PACK | Freq: Once | Status: DC

## 2015-08-24 NOTE — Progress Notes (Signed)
VASCULAR LAB PRELIMINARY  PRELIMINARY  PRELIMINARY  PRELIMINARY  Bilateral lower extremity venous duplex completed.    Preliminary report:  Right -: Postive DVT noted in the distal posterior tibial veins .  No evidence of superficial thrombosis.  No Baker's cyst. Left: Positive for acute DVT coursing from the posterior tibial vein through the popliteal, femoral and common femoral veins  No evidence of superficial thrombosis.  No Baker's cyst.  Carl Gutierrez, RVS 08/24/2015, 4:37 PM

## 2015-08-24 NOTE — ED Notes (Signed)
Spoke with Vascular US, pt was positive for DVT

## 2015-08-24 NOTE — ED Provider Notes (Signed)
CSN: [id number]     Arrival date & time 08/24/15  1652 History   First MD Initiated Contact with Patient 08/24/15 2018     Chief Complaint  Patient presents with  . DVT     (Consider location/radiation/quality/duration/timing/severity/associated sxs/prior Treatment) HPI 70 year old male who presents with DVT. History of prior DVT in the setting of back surgery, for which she was on warfarin 4-5 years ago, but currently is not anticoagulated. Also history of hypertension, hyperlipidemia diabetes and CAD. He was recently admitted in the hospital by urology and underwent prostatectomy for BPH. States that during hospitalization and afterwards developed swelling in the left lower extremity. Was seen for follow-up for catheter removal by the urologist today, and sent from the office for ultrasound of his lower extremities. Ultrasound was notable for acute DVT in the left posterior tibial vein through popliteal, femoral, and common femoral veins. Also evidence of DVT in the distal posterior tibial veins on the right lower extremity. Since the ED for treatment. Denies any chest pain, difficulty breathing, fevers or chills.   Past Medical History  Diagnosis Date  . Panhypopituitarism ([place])     Status post pituitary adenoma removal in 2012  . Hyperlipidemia with target LDL less than 70   . Essential hypertension   . Diabetes mellitus     Low-dose metformin  . CAD S/P percutaneous coronary angioplasty February 2004; 2008    PCI-dLAD: 2.25 mm x 16 mm Express BMS; Ramus- 2.75 mm x 18 mm Cypher DES (2.8 mm); follow-up 11/'08: Patent LAD/ramus stents. 90% small OM. EF 50-60%.; Myoview 1/'12: Exercise 9 min, 10 METS with no ischemia/infarction.   . Hypothyroidism     Synthroid  . History of DVT of lower extremity      postoperative  . DJD (degenerative joint disease), lumbosacral      status post L4-L5 surgery   Past Surgical History  Procedure Laterality Date  . Brain surgery  2012    Removal of  pituitary adenoma  . Spine surgery      L4-L5  . Coronary angioplasty with stent placement  February 2004    PCI-dLAD: 2.25 mm x 16 mm BMS;;PCI- RI: 2.75 mm 18 mm Cypher DES  . Cardiac catheterization  November 2008    Significant LAD tapering but no significant disease the distal stent. Patent Ramus stent. 90% lesion in small OM 2; small nondominant RCA. Medical therapy. EF 50-60%.  . Nm myoview ltd  January 2012    Exercise ~9 min - 10 METS; no ischemia or infarction  . Prostatectomy N/A 08/13/2015    Procedure: TRANSVESICLE OPEN PROSTATECTOMY** ;  Surgeon: Carolan Clines, MD;  Location: [facility];  Service: Urology;  Laterality: N/A;   History reviewed. No pertinent family history. Social History  Substance Use Topics  . Smoking status: Former Smoker    Quit date: 06/24/1993  . Smokeless tobacco: Never Used  . Alcohol Use: No    Review of Systems 10/14 systems reviewed and are negative other than those stated in the HPI   Allergies  Hydrochlorothiazide and Other  Home Medications   Prior to Admission medications   Medication Sig Start Date End Date Taking? Authorizing Provider  amLODipine (NORVASC) 10 MG tablet TAKE 1 TABLET (10 MG TOTAL) BY MOUTH DAILY. 03/09/15  Yes Wendie Agreste, MD  aspirin 81 MG tablet Take 81 mg by mouth daily.   Yes Historical Provider, MD  docusate sodium (COLACE) 100 MG capsule Take 1 capsule (100 mg total) by  mouth 2 (two) times daily. Patient taking differently: Take 100 mg by mouth daily as needed for mild constipation or moderate constipation.  08/15/15  Yes Christell Faith, MD  hydrocortisone (CORTEF) 20 MG tablet Take 10-20 mg by mouth 2 (two) times daily. Takes 1 tablet in the morning and 1/2 tablet at night   Yes Historical Provider, MD  levothyroxine (SYNTHROID, LEVOTHROID) 88 MCG tablet TAKE 1 TABLET (88 MCG TOTAL) BY MOUTH DAILY. 02/27/15  Yes Elayne Snare, MD  lisinopril (PRINIVIL,ZESTRIL) 20 MG tablet TAKE 1 TABLET (20 MG TOTAL) BY MOUTH  DAILY. 04/20/13  Yes Orma Flaming, MD  metFORMIN (GLUCOPHAGE) 500 MG tablet TAKE 3 TABLETS WITH EVENING MEAL DAILY Patient taking differently: Take 500-1,000 mg by mouth 2 (two) times daily. 1 tablet in the morning and 2 tablets at night 02/27/15  Yes Elayne Snare, MD  metoprolol succinate (TOPROL-XL) 25 MG 24 hr tablet Take 1 tablet (25 mg total) by mouth daily. Start with one half pill per day for 1 week, then can increase to one pill per day. Patient taking differently: Take 25 mg by mouth daily.  07/12/15  Yes Wendie Agreste, MD  senna (SENOKOT) 8.6 MG TABS tablet Take 1 tablet (8.6 mg total) by mouth 2 (two) times daily. 08/15/15  Yes Christell Faith, MD  simvastatin (ZOCOR) 40 MG tablet Take 20 mg by mouth every evening.    Yes Historical Provider, MD  sulfamethoxazole-trimethoprim (BACTRIM DS,SEPTRA DS) 800-160 MG tablet Take 1 tablet by mouth 2 (two) times daily. Start taking this the day before your catheter comes out. Patient taking differently: Take 1 tablet by mouth 2 (two) times daily. ABT Start Date 08/23/15 & End Date 08/25/15 08/15/15  Yes Christell Faith, MD  apixaban (ELIQUIS) 5 MG TABS tablet Take 2 tablets (10 mg total) by mouth 2 (two) times daily. Take 10 mg (2 tablets) by mouth 2 times a day for 7 days. Then take 5 mg (1 tablet) by mouth two times a day. 08/24/15   Forde Dandy, MD  apixaban Arne Cleveland) KIT 1 kit by Does not apply route once. 08/24/15   Forde Dandy, MD  oxyCODONE-acetaminophen (PERCOCET/ROXICET) 5-325 MG tablet Take 1-2 tablets by mouth every 4 (four) hours as needed for moderate pain. 08/15/15   Christell Faith, MD   BP 111/73 mmHg  Pulse 64  Temp(Src) 97.9 F (36.6 C) (Oral)  Resp 18  SpO2 100% Physical Exam Physical Exam  Nursing note and vitals reviewed. Constitutional: Well developed, well nourished, non-toxic, and in no acute distress Head: Normocephalic and atraumatic.  Mouth/Throat: Oropharynx is clear and moist.  Neck: Normal range of motion. Neck supple.   Cardiovascular: Normal rate and regular rhythm.   Pulmonary/Chest: Effort normal and breath sounds normal.  Abdominal: Soft. There is no tenderness. There is no rebound and no guarding.  Musculoskeletal: Edema involving LLE > RLE.  Neurological: Alert, no facial droop, fluent speech, moves all extremities symmetrically Skin: Skin is warm and dry.  Psychiatric: Cooperative  ED Course  Procedures (including critical care time) Labs Review Labs Reviewed  COMPREHENSIVE METABOLIC PANEL - Abnormal; Notable for the following:    CO2 21 (*)    Glucose, Bld 131 (*)    BUN 21 (*)    Creatinine, Ser 1.73 (*)    Calcium 8.6 (*)    GFR calc non Af Amer 38 (*)    GFR calc Af Amer 44 (*)    All other components within  normal limits  CBC - Abnormal; Notable for the following:    WBC 12.2 (*)    RBC 3.45 (*)    Hemoglobin 10.4 (*)    HCT 32.5 (*)    Platelets 615 (*)    All other components within normal limits  PROTIME-INR - Abnormal; Notable for the following:    Prothrombin Time 15.6 (*)    All other components within normal limits    Imaging Review No results found. I have personally reviewed and evaluated these images and lab results as part of my medical decision-making.   EKG Interpretation None      MDM   Final diagnoses:  DVT (deep venous thrombosis), bilateral    70 year old male with prior history of DVT in the setting of surgery who presents with an acute DVT in the setting of recent prostatectomy. Is well-appearing and in no acute distress on presentation. Vital signs within normal limits. No signs or symptoms of potential PE. With some renal insufficiency on blood work but no evidence of coagulopathy or thrombocytopenia. Discussed with pharmacist regarding appropriate anticoagulation in setting of renal insufficiency. Options include warfarin with lovenox bridge and apixaban. I discussed options with the patient as well as risk and benefits of each option, and they  expressed preference for apixaban. Provided with prescription. Patient will follow-up closely with primary care doctor for reevaluation after the weekend. Strict return and follow-up instructions are reviewed. He expressed understanding of all discharge instructions and felt comfortable with the plan of care.    Forde Dandy, MD 08/25/15 0010

## 2015-08-24 NOTE — ED Notes (Addendum)
Pt was in the hospital for a week, a week ago. Pt has been having bilateral leg swelling since then. Pt had a vascular US on his legs today which showed blood clots in both legs. No CP or SOB. Not on blood thinners at present.

## 2015-08-24 NOTE — ED Notes (Signed)
MD at bedside. 

## 2015-08-24 NOTE — Telephone Encounter (Signed)
Call report received from [facility] vascular lab.  Recently admitted February 27 through March 2 for open prostatectomy on February 27. Heparin prophylaxis postop with history of DVT. Apparently had recent left leg swelling,  ultrasound ordered by Dr. Gaynelle Arabian with call report to me.  Study was positive for DVT in the left leg from the posterior tibialis to mid common femoral as well as a small clot in the right posterior tibialis distally. Advised the ultrasound tech to walk him over [facility] emergency room for further evaluation and to discuss appropriate anticoagulation. Charge nurse advised at [employer] .

## 2015-08-24 NOTE — Discharge Instructions (Signed)
Return for worsening symptoms, including worsening pain, difficulty breathing, chest pain, or any other symptoms concerning to you.  Deep Vein Thrombosis A deep vein thrombosis (DVT) is a blood clot (thrombus) that usually occurs in a deep, larger vein of the lower leg or the pelvis, or in an upper extremity such as the arm. These are dangerous and can lead to serious and even life-threatening complications if the clot travels to the lungs. A DVT can damage the valves in your leg veins so that instead of flowing upward, the blood pools in the lower leg. This is called post-thrombotic syndrome, and it can result in pain, swelling, discoloration, and sores on the leg. CAUSES A DVT is caused by the formation of a blood clot in your leg, pelvis, or arm. Usually, several things contribute to the formation of blood clots. A clot may develop when:  Your blood flow slows down.  Your vein becomes damaged in some way.  You have a condition that makes your blood clot more easily. RISK FACTORS A DVT is more likely to develop in:  People who are older, especially over 59 years of age.  People who are overweight (obese).  People who sit or lie still for a long time, such as during long-distance travel (over 4 hours), bed rest, hospitalization, or during recovery from certain medical conditions like a stroke.  People who do not engage in much physical activity (sedentary lifestyle).  People who have chronic breathing disorders.  People who have a personal or family history of blood clots or blood clotting disease.  People who have peripheral vascular disease (PVD), diabetes, or some types of cancer.  People who have heart disease, especially if the person had a recent heart attack or has congestive heart failure.  People who have neurological diseases that affect the legs (leg paresis).  People who have had a traumatic injury, such as breaking a hip or leg.  People who have recently had major or  lengthy surgery, especially on the hip, knee, or abdomen.  People who have had a central line placed inside a large vein.  People who take medicines that contain the hormone estrogen. These include birth control pills and hormone replacement therapy.  Pregnancy or during childbirth or the postpartum period.  Long plane flights (over 8 hours). SIGNS AND SYMPTOMS Symptoms of a DVT can include:   Swelling of your leg or arm, especially if one side is much worse.  Warmth and redness of your leg or arm, especially if one side is much worse.  Pain in your arm or leg. If the clot is in your leg, symptoms may be more noticeable or worse when you stand or walk.  A feeling of pins and needles, if the clot is in the arm. The symptoms of a DVT that has traveled to the lungs (pulmonary embolism, PE) usually start suddenly and include:  Shortness of breath while active or at rest.  Coughing or coughing up blood or blood-tinged mucus.  Chest pain that is often worse with deep breaths.  Rapid or irregular heartbeat.  Feeling light-headed or dizzy.  Fainting.  Feeling anxious.  Sweating. There may also be pain and swelling in a leg if that is where the blood clot started. These symptoms may represent a serious problem that is an emergency. Do not wait to see if the symptoms will go away. Get medical help right away. Call your local emergency services (911 in the [place] Do not drive yourself to the  hospital. DIAGNOSIS Your health care provider will take a medical history and perform a physical exam. You may also have other tests, including:  Blood tests to assess the clotting properties of your blood.  Imaging tests, such as CT, ultrasound, MRI, X-ray, and other tests to see if you have clots anywhere in your body. TREATMENT After a DVT is identified, it can be treated. The type of treatment that you receive depends on many factors, such as the cause of your DVT, your risk for bleeding or  developing more clots, and other medical conditions that you have. Sometimes, a combination of treatments is necessary. Treatment options may be combined and include:  Monitoring the blood clot with ultrasound.  Taking medicines by mouth, such as newer blood thinners (anticoagulants), thrombolytics, or warfarin.  Taking anticoagulant medicine by injection or through an IV tube.  Wearing compression stockings or using different types ofdevices.  Surgery (rare) to remove the blood clot or to place a filter in your abdomen to stop the blood clot from traveling to your lungs. Treatments for a DVT are often divided into immediate treatment and long-term treatment (up to 3 months after DVT). You can work with your health care provider to choose the treatment program that is best for you. HOME CARE INSTRUCTIONS If you are taking a newer oral anticoagulant:  Take the medicine every single day at the same time each day.  Understand what foods and drugs interact with this medicine.  Understand that there are no regular blood tests required when using this medicine.  Understand the side effects of this medicine, including excessive bruising or bleeding. Ask your health care provider or pharmacist about other possible side effects. If you are taking warfarin:  Understand how to take warfarin and know which foods can affect how warfarin works in Veterinary surgeon.  Understand that it is dangerous to take too much or too little warfarin. Too much warfarin increases the risk of bleeding. Too little warfarin continues to allow the risk for blood clots.  Follow your PT and INR blood testing schedule. The PT and INR results allow your health care provider to adjust your dose of warfarin. It is very important that you have your PT and INR tested as often as told by your health care provider.  Avoid major changes in your diet, or tell your health care provider before you change your diet. Arrange a visit with a  registered dietitian to answer your questions. Many foods, especially foods that are high in vitamin K, can interfere with warfarin and affect the PT and INR results. Eat a consistent amount of foods that are high in vitamin K, such as:  Spinach, kale, broccoli, cabbage, collard greens, turnip greens, Brussels sprouts, peas, cauliflower, seaweed, and parsley.  Beef liver and pork liver.  Green tea.  Soybean oil.  Tell your health care provider about any and all medicines, vitamins, and supplements that you take, including aspirin and other over-the-counter anti-inflammatory medicines. Be especially cautious with aspirin and anti-inflammatory medicines. Do not take those before you ask your health care provider if it is safe to do so. This is important because many medicines can interfere with warfarin and affect the PT and INR results.  Do not start or stop taking any over-the-counter or prescription medicine unless your health care provider or pharmacist tells you to do so. If you take warfarin, you will also need to do these things:  Hold pressure over cuts for longer than usual.  Tell  your dentist and other health care providers that you are taking warfarin before you have any procedures in which bleeding may occur.  Avoid alcohol or drink very small amounts. Tell your health care provider if you change your alcohol intake.  Do not use tobacco products, including cigarettes, chewing tobacco, and e-cigarettes. If you need help quitting, ask your health care provider.  Avoid contact sports. General Instructions  Take over-the-counter and prescription medicines only as told by your health care provider. Anticoagulant medicines can have side effects, including easy bruising and difficulty stopping bleeding. If you are prescribed an anticoagulant, you will also need to do these things:  Hold pressure over cuts for longer than usual.  Tell your dentist and other health care providers that  you are taking anticoagulants before you have any procedures in which bleeding may occur.  Avoid contact sports.  Wear a medical alert bracelet or carry a medical alert card that says you have had a PE.  Ask your health care provider how soon you can go back to your normal activities. Stay active to prevent new blood clots from forming.  Make sure to exercise while traveling or when you have been sitting or standing for a long period of time. It is very important to exercise. Exercise your legs by walking or by tightening and relaxing your leg muscles often. Take frequent walks.  Wear compression stockings as told by your health care provider to help prevent more blood clots from forming.  Do not use tobacco products, including cigarettes, chewing tobacco, and e-cigarettes. If you need help quitting, ask your health care provider.  Keep all follow-up appointments with your health care provider. This is important. PREVENTION Take these actions to decrease your risk of developing another DVT:  Exercise regularly. For at least 30 minutes every day, engage in:  Activity that involves moving your arms and legs.  Activity that encourages good blood flow through your body by increasing your heart rate.  Exercise your arms and legs every hour during long-distance travel (over 4 hours). Drink plenty of water and avoid drinking alcohol while traveling.  Avoid sitting or lying in bed for long periods of time without moving your legs.  Maintain a weight that is appropriate for your height. Ask your health care provider what weight is healthy for you.  If you are a woman who is over 47 years of age, avoid unnecessary use of medicines that contain estrogen. These include birth control pills.  Do not smoke, especially if you take estrogen medicines. If you need help quitting, ask your health care provider. If you are hospitalized, prevention measures may include:  Early walking after surgery, as  soon as your health care provider says that it is safe.  Receiving anticoagulants to prevent blood clots.If you cannot take anticoagulants, other options may be available, such as wearing compression stockings or using different types of devices. SEEK IMMEDIATE MEDICAL CARE IF:  You have new or increased pain, swelling, or redness in an arm or leg.  You have numbness or tingling in an arm or leg.  You have shortness of breath while active or at rest.  You have chest pain.  You have a rapid or irregular heartbeat.  You feel light-headed or dizzy.  You cough up blood.  You notice blood in your vomit, bowel movement, or urine. These symptoms may represent a serious problem that is an emergency. Do not wait to see if the symptoms will go away. Get medical help  right away. Call your local emergency services (911 in the [place] Do not drive yourself to the hospital.   This information is not intended to replace advice given to you by your health care provider. Make sure you discuss any questions you have with your health care provider.   Document Released: 06/02/2005 Document Revised: 02/21/2015 Document Reviewed: 09/27/2014 Elsevier Interactive Patient Education [employer].

## 2015-08-24 NOTE — Progress Notes (Signed)
[facility] consulted for Elequis assistance.  EDCM provided patient with 30 day free trial card of Elequis.  Patient's wife reports she will call patient's pcp on Monday to make a follow up appointment.  Patient thankful for assistance.  No further EDCm needs at this time.

## 2015-08-29 ENCOUNTER — Other Ambulatory Visit: Payer: Self-pay | Admitting: Endocrinology

## 2015-08-29 ENCOUNTER — Ambulatory Visit: Payer: Medicare Other | Admitting: Endocrinology

## 2015-08-30 ENCOUNTER — Other Ambulatory Visit: Payer: Self-pay | Admitting: Endocrinology

## 2015-09-05 ENCOUNTER — Encounter: Payer: Self-pay | Admitting: Family Medicine

## 2015-09-05 ENCOUNTER — Ambulatory Visit (INDEPENDENT_AMBULATORY_CARE_PROVIDER_SITE_OTHER): Payer: Medicare Other | Admitting: Family Medicine

## 2015-09-05 VITALS — BP 117/76 | HR 65 | Temp 98.1°F | Resp 17 | Ht 70.5 in | Wt 207.0 lb

## 2015-09-05 DIAGNOSIS — I824Y3 Acute embolism and thrombosis of unspecified deep veins of proximal lower extremity, bilateral: Secondary | ICD-10-CM

## 2015-09-05 DIAGNOSIS — Z9861 Coronary angioplasty status: Secondary | ICD-10-CM

## 2015-09-05 DIAGNOSIS — I2510 Atherosclerotic heart disease of native coronary artery without angina pectoris: Secondary | ICD-10-CM | POA: Diagnosis not present

## 2015-09-05 DIAGNOSIS — I82409 Acute embolism and thrombosis of unspecified deep veins of unspecified lower extremity: Secondary | ICD-10-CM | POA: Diagnosis not present

## 2015-09-05 MED ORDER — APIXABAN 5 MG PO TABS: 5.0000 mg | ORAL_TABLET | Freq: Two times a day (BID) | ORAL | Status: DC

## 2015-09-05 NOTE — Patient Instructions (Addendum)
     IF you received an x-ray today, you will receive an invoice from [facility]. Please contact [facility] at [phone] with questions or concerns regarding your invoice.   IF you received labwork today, you will receive an invoice from [employer]. Please contact [facility] at [phone] with questions or concerns regarding your invoice.   Our billing staff will not be able to assist you with questions regarding bills from these companies.  You will be contacted with the lab results as soon as they are available. The fastest way to get your results is to activate your My Chart account. Instructions are located on the last page of this paperwork. If you have not heard from [place] regarding the results in 2 weeks, please contact this office.     I will refer you to hematologist to determine timing of Eliquis and whether you need other testing for your blood clots.   As far as DOT clearance, you will usually need to be on blood thinners for 1 month, then evaluated to determine clearance. If doing well at 1 month, anticipate 1 year card.    Return to the clinic or go to the nearest emergency room if any of your symptoms worsen or new symptoms occur.

## 2015-09-05 NOTE — Progress Notes (Signed)
Subjective:  By signing my name below, I, [name], attest that this documentation has been prepared under the direction and in the presence of [name], MD. Electronically Signed: [name], [place]. 09/05/2015 , 12:36 PM .  Patient was seen in Room 23 .   Patient ID: Carl Gutierrez, male    DOB: [birth year], 70 y.o.   MRN: [id number] Chief Complaint  Patient presents with  . Hospitalization Follow-up    blood clot   HPI Carl Gutierrez is a 70 y.o. male   Hospital follow up Pt is here for hospitalization follow up after recent surgery for prostatectomy for BPH on 08/13/15.  He had previous DVT so he was started on heparin post op early; discharged post op day 3, and had some leg pain and swelling. His urologist ordered venous doppler bilaterally on 08/24/15. He was noted to have DVT (left > right) in his lower extremities, bilateral DVT evaluated on 08/24/15. Options were discussed and started patient on eliquis 21m bid initially for 1 week, and transitioned to 573mbid. He denies any complications with the eliquis.   His last DVT was in 2011 and believes he was "on blood thinners for less than 6 months". He noticed some changes in his urine; sometimes it would be clear and sometimes a little darker. He denies finding any blood clots. He's mentioned this to Dr. TaGaynelle Arabianand was informed that this was expected. He denies chest pain, shortness of breath, or blood in stool.    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 CoLorelei Pontn 05/18/15. She informed the patient that he needed a stress test done. The patient complied and had stress test performed, done by Dr. HaEllyn Hackn 07/19/15. Low risk stress test with EF 52%. However, there are some complications as Dr. CoLorelei Ponts no longer working at this practice.   Patient Active Problem List   Diagnosis Date Noted  . BPH (benign prostatic hyperplasia) 08/13/2015  . Benign localized hyperplasia of prostate with urinary obstruction  07/23/2015  . Preop cardiovascular exam 07/17/2015  . Encounter for CDL (commercial driving license) exam 0183/[id number]. Obesity (BMI 30-39.9) 06/26/2013  . Essential hypertension 06/26/2013  . Panhypopituitarism ([facility]06/08/2011  .   11/17/2011  . Diabetes [place] ([place]/08/2011  . Hyperlipidemia LDL goal <70 11/17/2011  . CAD S/P percutaneous coronary angioplasty 07/19/2002   Past Medical History  Diagnosis Date  . Panhypopituitarism ([facility]    Status post pituitary adenoma removal in 2012  . Hyperlipidemia with target LDL less than 70   . Essential hypertension   . Diabetes mellitus     Low-dose metformin  . CAD S/P percutaneous coronary angioplasty February 2004; 2008    PCI-dLAD: 2.25 mm x 16 mm Express BMS; Ramus- 2.75 mm x 18 mm Cypher DES (2.8 mm); follow-up 11/'08: Patent LAD/ramus stents. 90% small OM. EF 50-60%.; Myoview 1/'12: Exercise 9 min, 10 METS with no ischemia/infarction.   . Hypothyroidism     Synthroid  . History of DVT of lower extremity      postoperative  . DJD (degenerative joint disease), lumbosacral      status post L4-L5 surgery   Past Surgical History  Procedure Laterality Date  . Brain surgery  2012    Removal of pituitary adenoma  . Spine surgery      L4-L5  . Coronary angioplasty with stent placement  February 2004    PCI-dLAD: 2.25 mm x 16 mm BMS;;PCI- RI: 2.75 mm 18  mm Cypher DES  . Cardiac catheterization  November 2008    Significant LAD tapering but no significant disease the distal stent. Patent Ramus stent. 90% lesion in small OM 2; small nondominant RCA. Medical therapy. EF 50-60%.  . Nm myoview ltd  January 2012    Exercise ~9 min - 10 METS; no ischemia or infarction  . Prostatectomy N/A 08/13/2015    Procedure: TRANSVESICLE OPEN PROSTATECTOMY** ;  Surgeon: Carolan Clines, MD;  Location: [facility];  Service: Urology;  Laterality: N/A;   Allergies  Allergen Reactions  . Hydrochlorothiazide     Leg and side pain with this  . Other      Blood pressure med name unknown.   Prior to Admission medications   Medication Sig Start Date End Date Taking? Authorizing Provider  amLODipine (NORVASC) 10 MG tablet TAKE 1 TABLET (10 MG TOTAL) BY MOUTH DAILY. 03/09/15   Wendie Agreste, MD  apixaban (ELIQUIS) 5 MG TABS tablet Take 2 tablets (10 mg total) by mouth 2 (two) times daily. Take 10 mg (2 tablets) by mouth 2 times a day for 7 days. Then take 5 mg (1 tablet) by mouth two times a day. 08/24/15   Forde Dandy, MD  apixaban Arne [place]) KIT 1 kit by Does not apply route once. 08/24/15   Forde Dandy, MD  aspirin 81 MG tablet Take 81 mg by mouth daily.    Historical Provider, MD  docusate sodium (COLACE) 100 MG capsule Take 1 capsule (100 mg total) by mouth 2 (two) times daily. Patient taking differently: Take 100 mg by mouth daily as needed for mild constipation or moderate constipation.  08/15/15   Christell Faith, MD  hydrocortisone (CORTEF) 20 MG tablet Take 10-20 mg by mouth 2 (two) times daily. Takes 1 tablet in the morning and 1/2 tablet at night    Historical Provider, MD  levothyroxine (SYNTHROID, LEVOTHROID) 88 MCG tablet TAKE 1 TABLET (88 MCG TOTAL) BY MOUTH DAILY. 08/29/15   Elayne Snare, MD  levothyroxine (SYNTHROID, LEVOTHROID) 88 MCG tablet TAKE 1 TABLET (88 MCG TOTAL) BY MOUTH DAILY. 08/31/15   Elayne Snare, MD  lisinopril (PRINIVIL,ZESTRIL) 20 MG tablet TAKE 1 TABLET (20 MG TOTAL) BY MOUTH DAILY. 04/20/13   Orma Flaming, MD  metFORMIN (GLUCOPHAGE) 500 MG tablet TAKE 3 TABLETS WITH EVENING MEAL DAILY 08/29/15   Elayne Snare, MD  metFORMIN (GLUCOPHAGE) 500 MG tablet TAKE 3 TABLETS WITH EVENING MEAL DAILY 08/31/15   Elayne Snare, MD  metoprolol succinate (TOPROL-XL) 25 MG 24 hr tablet Take 1 tablet (25 mg total) by mouth daily. Start with one half pill per day for 1 week, then can increase to one pill per day. Patient taking differently: Take 25 mg by mouth daily.  07/12/15   Wendie Agreste, MD  oxyCODONE-acetaminophen (PERCOCET/ROXICET)  5-325 MG tablet Take 1-2 tablets by mouth every 4 (four) hours as needed for moderate pain. 08/15/15   Christell Faith, MD  senna (SENOKOT) 8.6 MG TABS tablet Take 1 tablet (8.6 mg total) by mouth 2 (two) times daily. 08/15/15   Christell Faith, MD  simvastatin (ZOCOR) 40 MG tablet Take 20 mg by mouth every evening.     Historical Provider, MD  sulfamethoxazole-trimethoprim (BACTRIM DS,SEPTRA DS) 800-160 MG tablet Take 1 tablet by mouth 2 (two) times daily. Start taking this the day before your catheter comes out. Patient taking differently: Take 1 tablet by mouth 2 (two) times daily. ABT Start Date 08/23/15 & End Date  08/25/15 08/15/15   Christell Faith, MD   Social History   Social History  . Marital Status: Married    Spouse Name: N/A  . Number of Children: N/A  . Years of Education: N/A   Occupational History  . Not on file.   Social History Main Topics  . Smoking status: Former Smoker    Quit date: 06/24/1993  . Smokeless tobacco: Never Used  . Alcohol Use: No  . Drug Use: No  . Sexual Activity: Not on file   Other Topics Concern  . Not on file   Social History Narrative   He is a married father of 2, grandfather 62. Exercises only occasionally. Not as much as he desires 2. 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   He does not smoke cigarettes -- quit in 1995. Does not drink alcohol.   Review of Systems  Constitutional: Negative for fever, chills and fatigue.  Respiratory: Negative for shortness of breath and wheezing.   Cardiovascular: Positive for leg swelling. Negative for chest pain.  Gastrointestinal: Negative for nausea, vomiting and blood in stool.  Genitourinary: Negative for hematuria.  Neurological: Negative for dizziness, weakness and headaches.       Objective:   Physical Exam  Constitutional: He is oriented to person, place, and time. He appears well-developed and well-nourished. No distress.  HENT:  Head: Normocephalic and atraumatic.  Eyes: EOM are normal. Pupils are  equal, round, and reactive to light.  Neck: Neck supple.  Cardiovascular: Normal rate, regular rhythm and normal heart sounds.   No murmur heard. Pulmonary/Chest: Effort normal and breath sounds normal. No respiratory distress. He has no wheezes.  Musculoskeletal: Normal range of motion.  Full strength in lower extremities, no calf swelling  Neurological: He is alert and oriented to person, place, and time.  Skin: Skin is warm and dry.  Psychiatric: He has a normal mood and affect. His behavior is normal.  Nursing note and vitals reviewed.   Filed Vitals:   09/05/15 1146  BP: 117/76  Pulse: 65  Temp: 98.1 F (36.7 C)  TempSrc: Oral  Resp: 17  Height: 5' 10.5" (1.791 m)  Weight: 207 lb (93.895 kg)  SpO2: 97%      Assessment & Plan:   Carl Gutierrez is a 70 y.o. male Acute deep vein thrombosis (DVT) of proximal vein of both lower extremities ([place]) - Plan: Ambulatory referral to Hematology, apixaban (ELIQUIS) 5 MG TABS tablet, DISCONTINUED: apixaban (ELIQUIS) 5 MG TABS tablet Recurrent deep vein thrombosis (DVT) ([place]) - Plan: Ambulatory referral to Hematology, apixaban (ELIQUIS) 5 MG TABS tablet, DISCONTINUED: apixaban (ELIQUIS) 5 MG TABS tablet  -DVT, bilateral lower extremities after recent surgery. However has had previous DVT after surgery as above. Denies any chest pain, dyspnea, leg pain currently. Lower extremity edema has improved.  -Due to recurrent nature, will have him evaluated by hematology to decide if other testing needed, and duration of anticoagulation. Continue Eliquis 5 mg twice a day for now.  In regards to his DOT physical, previous paperwork was provided regarding clearance after having low risk stress test. However discussed with current DVT, and recent start on anticoagulation, typically would need to be on anticoagulation for 1 month with stability and recheck to determine readiness to drive. Understanding expressed.  Meds ordered this encounter  Medications    . DISCONTD: apixaban (ELIQUIS) 5 MG TABS tablet    Sig: Take 1 tablet (5 mg total) by mouth 2 (two) times daily. Take 10 mg (  2 tablets) by mouth 2 times a day for 7 days. Then take 5 mg (1 tablet) by mouth two times a day.    Dispense:  60 tablet    Refill:  1  . apixaban (ELIQUIS) 5 MG TABS tablet    Sig: Take 1 tablet (5 mg total) by mouth 2 (two) times daily.    Dispense:  60 tablet    Refill:  1   Patient Instructions       IF you received an x-ray today, you will receive an invoice from [facility]. Please contact [facility] at [phone] with questions or concerns regarding your invoice.   IF you received labwork today, you will receive an invoice from [employer]. Please contact [facility] at [phone] with questions or concerns regarding your invoice.   Our billing staff will not be able to assist you with questions regarding bills from these companies.  You will be contacted with the lab results as soon as they are available. The fastest way to get your results is to activate your My Chart account. Instructions are located on the last page of this paperwork. If you have not heard from [place] regarding the results in 2 weeks, please contact this office.     I will refer you to hematologist to determine timing of Eliquis and whether you need other testing for your blood clots.   As far as DOT clearance, you will usually need to be on blood thinners for 1 month, then evaluated to determine clearance. If doing well at 1 month, anticipate 1 year card.    Return to the clinic or go to the nearest emergency room if any of your symptoms worsen or new symptoms occur.    I personally performed the services described in this documentation, which was scribed in my presence. The recorded information has been reviewed and considered, and addended by me as needed.

## 2015-09-06 DIAGNOSIS — M6281 Muscle weakness (generalized): Secondary | ICD-10-CM | POA: Diagnosis not present

## 2015-09-06 DIAGNOSIS — N393 Stress incontinence (female) (male): Secondary | ICD-10-CM | POA: Diagnosis not present

## 2015-09-06 DIAGNOSIS — R351 Nocturia: Secondary | ICD-10-CM | POA: Diagnosis not present

## 2015-09-14 ENCOUNTER — Encounter: Payer: Self-pay | Admitting: Hematology and Oncology

## 2015-09-17 ENCOUNTER — Telehealth: Payer: Self-pay | Admitting: Hematology and Oncology

## 2015-09-17 NOTE — Telephone Encounter (Signed)
Mailed np packet Faxed np letter to referring office Intake Referring Dr. Carlota Raspberry Dx-DVT

## 2015-09-20 DIAGNOSIS — N393 Stress incontinence (female) (male): Secondary | ICD-10-CM | POA: Diagnosis not present

## 2015-09-20 DIAGNOSIS — M6281 Muscle weakness (generalized): Secondary | ICD-10-CM | POA: Diagnosis not present

## 2015-09-20 DIAGNOSIS — R351 Nocturia: Secondary | ICD-10-CM | POA: Diagnosis not present

## 2015-09-26 ENCOUNTER — Other Ambulatory Visit: Payer: Self-pay | Admitting: Family Medicine

## 2015-10-01 ENCOUNTER — Ambulatory Visit (HOSPITAL_BASED_OUTPATIENT_CLINIC_OR_DEPARTMENT_OTHER): Payer: Medicare Other

## 2015-10-01 ENCOUNTER — Telehealth: Payer: Self-pay | Admitting: *Deleted

## 2015-10-01 ENCOUNTER — Ambulatory Visit (HOSPITAL_BASED_OUTPATIENT_CLINIC_OR_DEPARTMENT_OTHER): Payer: Medicare Other | Admitting: Hematology and Oncology

## 2015-10-01 ENCOUNTER — Encounter: Payer: Self-pay | Admitting: Hematology and Oncology

## 2015-10-01 ENCOUNTER — Telehealth: Payer: Self-pay | Admitting: Hematology and Oncology

## 2015-10-01 VITALS — BP 118/68 | HR 73 | Temp 98.3°F | Resp 18 | Ht 70.5 in | Wt 211.2 lb

## 2015-10-01 DIAGNOSIS — R748 Abnormal levels of other serum enzymes: Secondary | ICD-10-CM

## 2015-10-01 DIAGNOSIS — R319 Hematuria, unspecified: Secondary | ICD-10-CM | POA: Diagnosis not present

## 2015-10-01 DIAGNOSIS — I82409 Acute embolism and thrombosis of unspecified deep veins of unspecified lower extremity: Secondary | ICD-10-CM

## 2015-10-01 DIAGNOSIS — R7989 Other specified abnormal findings of blood chemistry: Secondary | ICD-10-CM

## 2015-10-01 HISTORY — DX: Acute embolism and thrombosis of unspecified deep veins of unspecified lower extremity: I82.409

## 2015-10-01 LAB — BASIC METABOLIC PANEL
Anion Gap: 10 mEq/L (ref 3–11)
BUN: 19.2 mg/dL (ref 7.0–26.0)
CO2: 23 mEq/L (ref 22–29)
Calcium: 9.2 mg/dL (ref 8.4–10.4)
Chloride: 107 mEq/L (ref 98–109)
Creatinine: 1.3 mg/dL (ref 0.7–1.3)
EGFR: 64 mL/min/{1.73_m2} — ABNORMAL LOW (ref >90)
Glucose: 163 mg/dl — ABNORMAL HIGH (ref 70–140)
Potassium: 4.1 mEq/L (ref 3.5–5.1)
Sodium: 140 mEq/L (ref 136–145)

## 2015-10-01 LAB — CBC WITH DIFFERENTIAL/PLATELET
BASO%: 1.1 % (ref 0.0–2.0)
Basophils Absolute: 0.1 10*3/uL (ref 0.0–0.1)
EOS%: 1.4 % (ref 0.0–7.0)
Eosinophils Absolute: 0.1 10*3/uL (ref 0.0–0.5)
HCT: 37.2 % — ABNORMAL LOW (ref 38.4–49.9)
HGB: 11.8 g/dL — ABNORMAL LOW (ref 13.0–17.1)
LYMPH%: 20.7 % (ref 14.0–49.0)
MCH: 28.5 pg (ref 27.2–33.4)
MCHC: 31.8 g/dL — ABNORMAL LOW (ref 32.0–36.0)
MCV: 89.6 fL (ref 79.3–98.0)
MONO#: 0.7 10*3/uL (ref 0.1–0.9)
MONO%: 10.1 % (ref 0.0–14.0)
NEUT#: 4.9 10*3/uL (ref 1.5–6.5)
NEUT%: 66.7 % (ref 39.0–75.0)
Platelets: 357 10*3/uL (ref 140–400)
RBC: 4.15 10*6/uL — ABNORMAL LOW (ref 4.20–5.82)
RDW: 13.9 % (ref 11.0–14.6)
WBC: 7.3 10*3/uL (ref 4.0–10.3)
lymph#: 1.5 10*3/uL (ref 0.9–3.3)

## 2015-10-01 MED ORDER — RIVAROXABAN 20 MG PO TABS: 20.0000 mg | ORAL_TABLET | Freq: Every day | ORAL | Status: DC

## 2015-10-01 NOTE — Progress Notes (Signed)
[place] NOTE  Patient Care Team: Wendie Agreste, MD as PCP - General (Family Medicine) Wendie Agreste, MD as Consulting Physician (Family Medicine)  CHIEF COMPLAINTS/PURPOSE OF CONSULTATION:  Recurrent DVT  HISTORY OF PRESENTING ILLNESS:  Carl Gutierrez 69 y.o. male is here with his wife for recurrent DVT. The patient is a Administrator. He had remote pituitary surgery in 2011. After his surgery, the patient has lower extremity DVT and was appropriately anticoagulated. He has hormone deficiency and has been placed on testosterone replacement therapy up until the time of recent prostate surgery. On 08/13/2015, he underwent open suprapubic simple prostatectomy. Biopsy was benign for prostate cancer. The patient was immobile for almost 7 days after the surgery. He has mild intermittent hematuria since surgery. Subsequently, the patient noted significant left lower extremity edema.  On 08/24/2015, he underwent ultrasound venous Doppler of both lower extremity and was found to have possible benign chronic changes in the right leg and extensive DVT on the left leg. He was placed on Eliquis starter pack. Recently, he has to pay over $400 out of pocket to pay for his second prescription Since his prescription, he denies lower extremity swelling, warmth, tenderness & erythema.  He denies recent chest pain on exertion, shortness of breath on minimal exertion, pre-syncopal episodes, hemoptysis, or palpitation. He had no prior history or diagnosis of cancer. His age appropriate screening programs are up-to-date. He had prior surgeries before and had post surgical thromboembolic event after pituitary surgery. He had other surgeries without associated postoperative DVT The patient had been exposed to testosterone replacement therapy recently. There is no family history of blood clots or miscarriages.  MEDICAL HISTORY:  Past Medical History  Diagnosis Date  .  Panhypopituitarism ([place])     Status post pituitary adenoma removal in 2012  . Hyperlipidemia with target LDL less than 70   . Essential hypertension   . Diabetes mellitus     Low-dose metformin  . CAD S/P percutaneous coronary angioplasty February 2004; 2008    PCI-dLAD: 2.25 mm x 16 mm Express BMS; Ramus- 2.75 mm x 18 mm Cypher DES (2.8 mm); follow-up 11/'08: Patent LAD/ramus stents. 90% small OM. EF 50-60%.; Myoview 1/'12: Exercise 9 min, 10 METS with no ischemia/infarction.   . Hypothyroidism     Synthroid  . History of DVT of lower extremity      postoperative  . DJD (degenerative joint disease), lumbosacral      status post L4-L5 surgery  . Recurrent deep vein thrombosis (DVT) ([place]) 10/01/2015    SURGICAL HISTORY: Past Surgical History  Procedure Laterality Date  . Brain surgery  2012    Removal of pituitary adenoma  . Spine surgery      L4-L5  . Coronary angioplasty with stent placement  February 2004    PCI-dLAD: 2.25 mm x 16 mm BMS;;PCI- RI: 2.75 mm 18 mm Cypher DES  . Cardiac catheterization  November 2008    Significant LAD tapering but no significant disease the distal stent. Patent Ramus stent. 90% lesion in small OM 2; small nondominant RCA. Medical therapy. EF 50-60%.  . Nm myoview ltd  January 2012    Exercise ~9 min - 10 METS; no ischemia or infarction  . Prostatectomy N/A 08/13/2015    Procedure: TRANSVESICLE OPEN PROSTATECTOMY** ;  Surgeon: Carolan Clines, MD;  Location: [facility];  Service: Urology;  Laterality: N/A;    SOCIAL HISTORY: Social History   Social History  . Marital Status: Married  Spouse Name: N/A  . Number of Children: N/A  . Years of Education: N/A   Occupational History  . Not on file.   Social History Main Topics  . Smoking status: Former Smoker    Quit date: 06/24/1993  . Smokeless tobacco: Never Used  . Alcohol Use: No  . Drug Use: No  . 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 Not on file   Social History Narrative   He is a married father of 2, grandfather 83. Exercises only occasionally. Not as much as he desires 2. 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   He does not smoke cigarettes -- quit in 1995. Does not drink alcohol.    FAMILY HISTORY: Family History  Problem Relation Age of Onset  . Cancer Mother     pancreatic ca    ALLERGIES:  is allergic to hydrochlorothiazide and other.  MEDICATIONS:  Current Outpatient Prescriptions  Medication Sig Dispense Refill  . amLODipine (NORVASC) 10 MG tablet TAKE 1 TABLET (10 MG TOTAL) BY MOUTH DAILY. 90 tablet 0  . apixaban (ELIQUIS) 5 MG TABS tablet Take 1 tablet (5 mg total) by mouth 2 (two) times daily. 60 tablet 1  . apixaban (ELIQUIS) KIT 1 kit by Does not apply route once. 1 kit 0  . aspirin 81 MG tablet Take 81 mg by mouth daily. Reported on 09/05/2015    . hydrocortisone (CORTEF) 20 MG tablet Take 10-20 mg by mouth 2 (two) times daily. Takes 1 tablet in the morning and 1/2 tablet at night    . levothyroxine (SYNTHROID, LEVOTHROID) 88 MCG tablet TAKE 1 TABLET (88 MCG TOTAL) BY MOUTH DAILY. 90 tablet 1  . levothyroxine (SYNTHROID, LEVOTHROID) 88 MCG tablet TAKE 1 TABLET (88 MCG TOTAL) BY MOUTH DAILY. 90 tablet 1  . lisinopril (PRINIVIL,ZESTRIL) 20 MG tablet TAKE 1 TABLET (20 MG TOTAL) BY MOUTH DAILY. 90 tablet 0  . metFORMIN (GLUCOPHAGE) 500 MG tablet TAKE 3 TABLETS WITH EVENING MEAL DAILY 270 tablet 1  . metFORMIN (GLUCOPHAGE) 500 MG tablet TAKE 3 TABLETS WITH EVENING MEAL DAILY 270 tablet 1  . metoprolol succinate (TOPROL-XL) 25 MG 24 hr tablet Take 1 tablet (25 mg total) by mouth daily. Start with one half pill per day for 1 week, then can increase to one pill per day. (Patient taking differently: Take 25 mg by mouth daily. ) 90 tablet 1  . rivaroxaban (XARELTO) 20 MG TABS tablet Take 1 tablet (20 mg total) by mouth daily with supper. 30 tablet 0  . senna (SENOKOT) 8.6  MG TABS tablet Take 1 tablet (8.6 mg total) by mouth 2 (two) times daily. (Patient not taking: Reported on 09/05/2015) 120 each 0  . simvastatin (ZOCOR) 40 MG tablet Take 20 mg by mouth every evening.     . [DISCONTINUED] testosterone cypionate (DEPO-TESTOSTERONE) 200 MG/ML injection Inject 1.5 mLs (300 mg total) into the muscle every 21 ( twenty-one) days. 10 mL 3   Current Facility-Administered Medications  Medication Dose Route Frequency Provider Last Rate Last Dose  . testosterone cypionate (DEPOTESTOTERONE CYPIONATE) injection 300 mg  300 mg Intramuscular Once Elayne Snare, MD      . testosterone cypionate (DEPOTESTOTERONE CYPIONATE) injection 350 mg  350 mg Intramuscular Q21 days Elayne Snare, MD   350 mg at 10/18/14 1538    REVIEW OF SYSTEMS:   Constitutional: Denies fevers, chills  or abnormal night sweats Eyes: Denies blurriness of vision, double vision or watery eyes Ears, nose, mouth, throat, and face: Denies mucositis or sore throat Respiratory: Denies cough, dyspnea or wheezes Cardiovascular: Denies palpitation, chest discomfort or lower extremity swelling Gastrointestinal:  Denies nausea, heartburn or change in bowel habits Skin: Denies abnormal skin rashes Lymphatics: Denies new lymphadenopathy or easy bruising Neurological:Denies numbness, tingling or new weaknesses Behavioral/Psych: Mood is stable, no new changes  All other systems were reviewed with the patient and are negative.  PHYSICAL EXAMINATION: ECOG PERFORMANCE STATUS: 1 - Symptomatic but completely ambulatory  Filed Vitals:   10/01/15 1342  BP: 118/68  Pulse: 73  Temp: 98.3 F (36.8 C)  Resp: 18   Filed Weights   10/01/15 1342  Weight: 211 lb 3.2 oz (95.8 kg)    GENERAL:alert, no distress and comfortable SKIN: skin color, texture, turgor are normal, no rashes or significant lesions EYES: normal, conjunctiva are pink and non-injected, sclera clear OROPHARYNX:no exudate, no erythema and lips, buccal mucosa,  and tongue normal  NECK: supple, thyroid normal size, non-tender, without nodularity LYMPH:  no palpable lymphadenopathy in the cervical, axillary or inguinal LUNGS: clear to auscultation and percussion with normal breathing effort HEART: regular rate & rhythm and no murmurs and no lower extremity edema ABDOMEN:abdomen soft, non-tender and normal bowel sounds Musculoskeletal:no cyanosis of digits and no clubbing  PSYCH: alert & oriented x 3 with fluent speech NEURO: no focal motor/sensory deficits  LABORATORY DATA:  Lab Results  Component Value Date   WBC 12.2* 08/24/2015   HGB 10.4* 08/24/2015   HCT 32.5* 08/24/2015   MCV 94.2 08/24/2015   PLT 615* 08/24/2015     Chemistry      Component Value Date/Time   NA 140 08/24/2015 1748   K 4.2 08/24/2015 1748   CL 107 08/24/2015 1748   CO2 21* 08/24/2015 1748   BUN 21* 08/24/2015 1748   CREATININE 1.73* 08/24/2015 1748   CREATININE 1.23* 01/22/2015 1542      Component Value Date/Time   CALCIUM 8.6* 08/24/2015 1748   ALKPHOS 58 08/24/2015 1748   AST 27 08/24/2015 1748   ALT 41 08/24/2015 1748   BILITOT 0.5 08/24/2015 1748      RADIOGRAPHIC STUDIES: I reviewed the report of the venous Doppler dated 08/24/2015  ASSESSMENT & PLAN:   1) Recurrent DVT  I reviewed with the patient about the plan for care for recurrent DVT.  Both episodes of DVT appeared to be provoked. His first episode of DVT in 2011 was provoked due to immobility after surgery to the pituitary gland. He was appropriately anticoagulated. This last episode of blood clot appeared to be provoked again by recent exposure to testosterone injection, prostate surgery and immobilization.   There is no benefit for testing testing for thrombophilia disorder.   For recent provoked DVT, the duration of therapy is for 3 months. Due to extensive clot seen, I plan to repeat ultrasound venous Doppler in 2 months before discontinuation of treatment  We discussed about various  options of anticoagulation therapies including warfarin, low molecular weight heparin such as Lovenox or newer agents such as Rivaroxaban. Some of the risks and benefits discussed including costs involved, the need for monitoring, risks of life-threatening bleeding/hospitalization, reversibility of each agent in the event of bleeding or overdose, safety profile of each drug and taking into account other social issues such as ease of administration of medications, etc. Ultimately, we have made an informed decision for the patient to  continue his treatment with Eliquis until he finishes current prescription. I gave him a coupon to get $0 co-pay for Xarelto. If he is not able to get more than one month supply, I will give him some free samples for Xarelto.  I am concerned about recent elevated kidney function. The patient stated he has not eaten today. I recommend he goes to have his lunch now and eat something and then come back within an hour to repeat blood work. I suspect he has mild chronic renal failure, with worsening renal failure recently due to dehydration. I will call the patient with test results.  I recommend he increase mobility by walking as much as possible.  Finally, at the end of our consultation today, I reinforced the importance of preventive strategies such as avoiding hormonal supplement, avoiding cigarette smoking, keeping up-to-date with screening programs for early cancer detection, frequent ambulation for long distance travel and aggressive DVT prophylaxis in all surgical settings.  2) Mild hematuria For his mild, persistent hematuria, it could be related to recent surgery and the fact that he is on chronic anticoagulation therapy. I recommend observation only for now I recommend close follow-up with urologist  3) Elevated creatinine Recheck labs as above  Orders Placed This Encounter  Procedures  . CBC with Differential/Platelet    Standing Status: Future     Number of  Occurrences:      Standing Expiration Date: 11/04/2016  . Basic metabolic panel    Standing Status: Future     Number of Occurrences:      Standing Expiration Date: 11/04/2016    All questions were answered. The patient knows to call the clinic with any problems, questions or concerns. I spent 40 minutes counseling the patient face to face. The total time spent in the appointment was 55 minutes and more than 50% was on counseling.     [facility], Aubreanna Percle, MD 10/01/2015 2:12 PM

## 2015-10-01 NOTE — Telephone Encounter (Signed)
per pof to sch pt appt-MW did override-gave pt copyo f avs-adv central sch willc all to sch [place]

## 2015-10-01 NOTE — Telephone Encounter (Signed)
Per staff phone call and POF I have schedueld appts. Scheduler advised of appts.  JMW  

## 2015-10-02 ENCOUNTER — Telehealth: Payer: Self-pay | Admitting: *Deleted

## 2015-10-02 NOTE — Telephone Encounter (Signed)
Informed pt of lab results look good per Dr. Alvy Bimler,  Kidney function is wnl.  He verbalized understanding.

## 2015-10-02 NOTE — Telephone Encounter (Signed)
-----   Message from Heath Lark, MD sent at 10/02/2015  8:20 AM EDT ----- Regarding: labs from yesterday PLs let him know labs look great ----- Message -----    From: [facility]    Sent: 10/01/2015   3:32 PM      To: Heath Lark, MD

## 2015-10-11 DIAGNOSIS — M6281 Muscle weakness (generalized): Secondary | ICD-10-CM | POA: Diagnosis not present

## 2015-10-11 DIAGNOSIS — R351 Nocturia: Secondary | ICD-10-CM | POA: Diagnosis not present

## 2015-10-11 DIAGNOSIS — N393 Stress incontinence (female) (male): Secondary | ICD-10-CM | POA: Diagnosis not present

## 2015-10-17 DIAGNOSIS — N138 Other obstructive and reflux uropathy: Secondary | ICD-10-CM | POA: Diagnosis not present

## 2015-10-17 DIAGNOSIS — Z0000 Encounter for general adult medical examination without abnormal findings: Secondary | ICD-10-CM | POA: Diagnosis not present

## 2015-11-23 ENCOUNTER — Other Ambulatory Visit: Payer: Medicare Other

## 2015-11-23 ENCOUNTER — Other Ambulatory Visit: Payer: Self-pay | Admitting: Hematology and Oncology

## 2015-11-23 ENCOUNTER — Ambulatory Visit: Payer: Medicare Other | Admitting: Hematology and Oncology

## 2015-11-23 ENCOUNTER — Telehealth: Payer: Self-pay | Admitting: *Deleted

## 2015-11-23 DIAGNOSIS — I82409 Acute embolism and thrombosis of unspecified deep veins of unspecified lower extremity: Secondary | ICD-10-CM

## 2015-11-23 NOTE — Telephone Encounter (Signed)
Pt is unable to come in any earlier today for Doppler US due to his work schedule.  He would like to r/s to next week.  I scheduled doppler to next Wed at 11 am and Dr. Alvy Bimler can see pt on Thursday at [place] pt back w/ these appts and he says he needs later in day appointments.  He says any day next week,s except Monday, work as long at it can be as late as possible in the day.  Informed pt I will work on it and get back w/ him. Doppler US can be done late at [facility]- Scheduled for Wed 6/14 at 4 pm.   Pt to arrive at 3:45 pm.

## 2015-11-23 NOTE — Telephone Encounter (Signed)
I can see him at 330 pm 6/16 That's the latest I can offer

## 2015-11-23 NOTE — Telephone Encounter (Signed)
-----   Message from Heath Lark, MD sent at 11/23/2015 10:38 AM EDT ----- Regarding: Doppler US Hi cameo,   I have order doppler to be one 6/8. It's not done Do you think you can still try to get it done today before I see him? Otherwise we need to reschedule his appt

## 2015-11-26 ENCOUNTER — Telehealth: Payer: Self-pay | Admitting: Hematology and Oncology

## 2015-11-26 ENCOUNTER — Telehealth: Payer: Self-pay | Admitting: *Deleted

## 2015-11-26 NOTE — Telephone Encounter (Signed)
Informed pt of US Doppler appt made at [facility] Wed 6/14 at 4 pm. He will need to arrive at 3:45 pm, Use main entrance off [address] and use [employer].   Appt w/ Dr. Alvy Bimler will be Friday 6/14  3pm for lab and 3:30 pm for Dr. Alvy Bimler at [facility].  POF was sent on Friday 6/9.  Pt confirmed appts.

## 2015-11-26 NOTE — Telephone Encounter (Signed)
cld pt and adv per pof a appt sch @ 3:00 on 6/16-sent Melissa email to override appt for Dr Alvy Bimler on 6/16@3 :30 -30 min per pof-pt aware

## 2015-11-28 ENCOUNTER — Ambulatory Visit (HOSPITAL_COMMUNITY): Payer: Medicare Other

## 2015-11-29 ENCOUNTER — Ambulatory Visit (HOSPITAL_COMMUNITY)
Admission: RE | Admit: 2015-11-29 | Discharge: 2015-11-29 | Disposition: A | Discharge status: Home / Self Care | Payer: Medicare Other | Source: Ambulatory Visit | Attending: Hematology and Oncology | Admitting: Hematology and Oncology

## 2015-11-29 DIAGNOSIS — I82409 Acute embolism and thrombosis of unspecified deep veins of unspecified lower extremity: Secondary | ICD-10-CM | POA: Insufficient documentation

## 2015-11-29 NOTE — Progress Notes (Signed)
VASCULAR LAB PRELIMINARY  PRELIMINARY  PRELIMINARY  PRELIMINARY  Bilateral lower extremity venous duplex completed.    Preliminary report:  Right:  No evidence of DVT, superficial thrombosis, or Baker's cyst. Left: Chronic DVT noted in the popliteal vein.  No evidence of superficial thrombosis.  No Baker's cyst.  Jaeleah Smyser, RVS 11/29/2015, 4:51 PM

## 2015-11-30 ENCOUNTER — Encounter: Payer: Self-pay | Admitting: Hematology and Oncology

## 2015-11-30 ENCOUNTER — Other Ambulatory Visit (HOSPITAL_BASED_OUTPATIENT_CLINIC_OR_DEPARTMENT_OTHER): Payer: Medicare Other

## 2015-11-30 ENCOUNTER — Ambulatory Visit (HOSPITAL_BASED_OUTPATIENT_CLINIC_OR_DEPARTMENT_OTHER): Payer: Medicare Other | Admitting: Hematology and Oncology

## 2015-11-30 VITALS — BP 149/70 | HR 70 | Temp 98.7°F | Resp 18 | Ht 70.5 in | Wt 224.6 lb

## 2015-11-30 DIAGNOSIS — R7989 Other specified abnormal findings of blood chemistry: Secondary | ICD-10-CM

## 2015-11-30 DIAGNOSIS — I82409 Acute embolism and thrombosis of unspecified deep veins of unspecified lower extremity: Secondary | ICD-10-CM

## 2015-11-30 DIAGNOSIS — D631 Anemia in chronic kidney disease: Secondary | ICD-10-CM | POA: Diagnosis not present

## 2015-11-30 DIAGNOSIS — R748 Abnormal levels of other serum enzymes: Secondary | ICD-10-CM

## 2015-11-30 DIAGNOSIS — N189 Chronic kidney disease, unspecified: Secondary | ICD-10-CM | POA: Diagnosis not present

## 2015-11-30 LAB — CBC WITH DIFFERENTIAL/PLATELET
BASO%: 0.9 % (ref 0.0–2.0)
Basophils Absolute: 0.1 10*3/uL (ref 0.0–0.1)
EOS%: 1.3 % (ref 0.0–7.0)
Eosinophils Absolute: 0.1 10*3/uL (ref 0.0–0.5)
HCT: 35.8 % — ABNORMAL LOW (ref 38.4–49.9)
HGB: 11.4 g/dL — ABNORMAL LOW (ref 13.0–17.1)
LYMPH%: 19.8 % (ref 14.0–49.0)
MCH: 26.9 pg — ABNORMAL LOW (ref 27.2–33.4)
MCHC: 31.7 g/dL — ABNORMAL LOW (ref 32.0–36.0)
MCV: 84.8 fL (ref 79.3–98.0)
MONO#: 0.7 10*3/uL (ref 0.1–0.9)
MONO%: 10.1 % (ref 0.0–14.0)
NEUT#: 4.7 10*3/uL (ref 1.5–6.5)
NEUT%: 67.9 % (ref 39.0–75.0)
Platelets: 331 10*3/uL (ref 140–400)
RBC: 4.23 10*6/uL (ref 4.20–5.82)
RDW: 15.1 % — ABNORMAL HIGH (ref 11.0–14.6)
WBC: 7 10*3/uL (ref 4.0–10.3)
lymph#: 1.4 10*3/uL (ref 0.9–3.3)

## 2015-11-30 LAB — COMPREHENSIVE METABOLIC PANEL
ALT: 18 U/L (ref 0–55)
AST: 16 U/L (ref 5–34)
Albumin: 3.8 g/dL (ref 3.5–5.0)
Alkaline Phosphatase: 88 U/L (ref 40–150)
Anion Gap: 10 mEq/L (ref 3–11)
BUN: 23.9 mg/dL (ref 7.0–26.0)
CO2: 21 mEq/L — ABNORMAL LOW (ref 22–29)
Calcium: 9.3 mg/dL (ref 8.4–10.4)
Chloride: 110 mEq/L — ABNORMAL HIGH (ref 98–109)
Creatinine: 1.4 mg/dL — ABNORMAL HIGH (ref 0.7–1.3)
EGFR: 59 mL/min/{1.73_m2} — ABNORMAL LOW (ref >90)
Glucose: 130 mg/dl (ref 70–140)
Potassium: 4.3 mEq/L (ref 3.5–5.1)
Sodium: 140 mEq/L (ref 136–145)
Total Bilirubin: <0.3 mg/dL (ref 0.20–1.20)
Total Protein: 7.4 g/dL (ref 6.4–8.3)

## 2015-11-30 NOTE — Assessment & Plan Note (Signed)
This is likely anemia of chronic disease. The patient denies recent history of bleeding such as epistaxis, hematuria or hematochezia. He is asymptomatic from the anemia. We will observe for now.  

## 2015-11-30 NOTE — Assessment & Plan Note (Signed)
This is likely due to chronic renal failure from aging or hypertension. Monitor closely

## 2015-11-30 NOTE — Progress Notes (Signed)
[place] OFFICE PROGRESS NOTE  Wendie Agreste, MD SUMMARY OF HEMATOLOGIC HISTORY:  Carl Gutierrez male is here with his wife for recurrent DVT. The patient is a Administrator. He had remote pituitary surgery in 2011. After his surgery, the patient has lower extremity DVT and was appropriately anticoagulated. He has hormone deficiency and has been placed on testosterone replacement therapy up until the time of recent prostate surgery. On 08/13/2015, he underwent open suprapubic simple prostatectomy. Biopsy was benign for prostate cancer. The patient was immobile for almost 7 days after the surgery. He has mild intermittent hematuria since surgery. Subsequently, the patient noted significant left lower extremity edema.  On 08/24/2015, he underwent ultrasound venous Doppler of both lower extremity and was found to have possible benign chronic changes in the right leg and extensive DVT on the left leg. He was placed on Eliquis starter pack. Recently, he has to pay over $400 out of pocket to pay for his second prescription Since his prescription, he denies lower extremity swelling, warmth, tenderness & erythema.  He denies recent chest pain on exertion, shortness of breath on minimal exertion, pre-syncopal episodes, hemoptysis, or palpitation. He had no prior history or diagnosis of cancer. His age appropriate screening programs are up-to-date. He had prior surgeries before and had post surgical thromboembolic event after pituitary surgery. He had other surgeries without associated postoperative DVT The patient had been exposed to testosterone replacement therapy recently. There is no family history of blood clots or miscarriages. The patient was placed on Eliquis Repeat venous Doppler ultrasound 11/29/2015 show persistent chronic thrombotic changes in the left leg INTERVAL HISTORY: Carl Gutierrez 70 y.o. male returns for further follow-up. He feels well. He has occasional achiness  on his left leg. He stopped taking anticoagulation therapy at the end of May due to inability to afford his medication. He resume 81 mg aspirin recently. The patient denies any recent signs or symptoms of bleeding such as spontaneous epistaxis, hematuria or hematochezia.   I have reviewed the past medical history, past surgical history, social history and family history with the patient and they are unchanged from previous note.  ALLERGIES:  is allergic to hydrochlorothiazide and other.  MEDICATIONS:  Current Outpatient Prescriptions  Medication Sig Dispense Refill  . amLODipine (NORVASC) 10 MG tablet TAKE 1 TABLET (10 MG TOTAL) BY MOUTH DAILY. 90 tablet 0  . apixaban (ELIQUIS) 5 MG TABS tablet Take 1 tablet (5 mg total) by mouth 2 (two) times daily. 60 tablet 1  . apixaban (ELIQUIS) KIT 1 kit by Does not apply route once. 1 kit 0  . aspirin 81 MG tablet Take 81 mg by mouth daily. Reported on 09/05/2015    . hydrocortisone (CORTEF) 20 MG tablet Take 10-20 mg by mouth 2 (two) times daily. Takes 1 tablet in the morning and 1/2 tablet at night    . levothyroxine (SYNTHROID, LEVOTHROID) 88 MCG tablet TAKE 1 TABLET (88 MCG TOTAL) BY MOUTH DAILY. 90 tablet 1  . levothyroxine (SYNTHROID, LEVOTHROID) 88 MCG tablet TAKE 1 TABLET (88 MCG TOTAL) BY MOUTH DAILY. 90 tablet 1  . lisinopril (PRINIVIL,ZESTRIL) 20 MG tablet TAKE 1 TABLET (20 MG TOTAL) BY MOUTH DAILY. 90 tablet 0  . metFORMIN (GLUCOPHAGE) 500 MG tablet TAKE 3 TABLETS WITH EVENING MEAL DAILY 270 tablet 1  . metFORMIN (GLUCOPHAGE) 500 MG tablet TAKE 3 TABLETS WITH EVENING MEAL DAILY 270 tablet 1  . metoprolol succinate (TOPROL-XL) 25 MG 24 hr tablet Take 1 tablet (25 mg  total) by mouth daily. Start with one half pill per day for 1 week, then can increase to one pill per day. (Patient taking differently: Take 25 mg by mouth daily. ) 90 tablet 1  . senna (SENOKOT) 8.6 MG TABS tablet Take 1 tablet (8.6 mg total) by mouth 2 (two) times daily. 120  each 0  . simvastatin (ZOCOR) 40 MG tablet Take 20 mg by mouth every evening.     . [DISCONTINUED] testosterone cypionate (DEPO-TESTOSTERONE) 200 MG/ML injection Inject 1.5 mLs (300 mg total) into the muscle every 21 ( twenty-one) days. 10 mL 3   Current Facility-Administered Medications  Medication Dose Route Frequency Provider Last Rate Last Dose  . testosterone cypionate (DEPOTESTOTERONE CYPIONATE) injection 300 mg  300 mg Intramuscular Once Elayne Snare, MD      . testosterone cypionate (DEPOTESTOTERONE CYPIONATE) injection 350 mg  350 mg Intramuscular Q21 days Elayne Snare, MD   350 mg at 10/18/14 1538     REVIEW OF SYSTEMS:   Constitutional: Denies fevers, chills or night sweats Eyes: Denies blurriness of vision Ears, nose, mouth, throat, and face: Denies mucositis or sore throat Respiratory: Denies cough, dyspnea or wheezes Cardiovascular: Denies palpitation, chest discomfort  Gastrointestinal:  Denies nausea, heartburn or change in bowel habits Skin: Denies abnormal skin rashes Lymphatics: Denies new lymphadenopathy or easy bruising Neurological:Denies numbness, tingling or new weaknesses Behavioral/Psych: Mood is stable, no new changes  All other systems were reviewed with the patient and are negative.  PHYSICAL EXAMINATION: ECOG PERFORMANCE STATUS: 0 - Asymptomatic  Filed Vitals:   11/30/15 1506  BP: 149/70  Pulse: 70  Temp: 98.7 F (37.1 C)  Resp: 18   Filed Weights   11/30/15 1506  Weight: 224 lb 9.6 oz (101.878 kg)    GENERAL:alert, no distress and comfortable SKIN: skin color, texture, turgor are normal, no rashes or significant lesions EYES: normal, Conjunctiva are pink and non-injected, sclera clear OROPHARYNX:no exudate, no erythema and lips, buccal mucosa, and tongue normal  NECK: supple, thyroid normal size, non-tender, without nodularity LYMPH:  no palpable lymphadenopathy in the cervical, axillary or inguinal LUNGS: clear to auscultation and percussion  with normal breathing effort HEART: regular rate & rhythm and no murmurs and no lower extremity edema ABDOMEN:abdomen soft, non-tender and normal bowel sounds Musculoskeletal:no cyanosis of digits and no clubbing  NEURO: alert & oriented x 3 with fluent speech, no focal motor/sensory deficits  LABORATORY DATA:  I have reviewed the data as listed     Component Value Date/Time   NA 140 11/30/2015 1454   NA 140 08/24/2015 1748   K 4.3 11/30/2015 1454   K 4.2 08/24/2015 1748   CL 107 08/24/2015 1748   CO2 21* 11/30/2015 1454   CO2 21* 08/24/2015 1748   GLUCOSE 130 11/30/2015 1454   GLUCOSE 131* 08/24/2015 1748   BUN 23.9 11/30/2015 1454   BUN 21* 08/24/2015 1748   CREATININE 1.4* 11/30/2015 1454   CREATININE 1.73* 08/24/2015 1748   CREATININE 1.23* 01/22/2015 1542   CALCIUM 9.3 11/30/2015 1454   CALCIUM 8.6* 08/24/2015 1748   PROT 7.4 11/30/2015 1454   PROT 7.1 08/24/2015 1748   ALBUMIN 3.8 11/30/2015 1454   ALBUMIN 3.5 08/24/2015 1748   AST 16 11/30/2015 1454   AST 27 08/24/2015 1748   ALT 18 11/30/2015 1454   ALT 41 08/24/2015 1748   ALKPHOS 88 11/30/2015 1454   ALKPHOS 58 08/24/2015 1748   BILITOT <0.30 11/30/2015 1454   BILITOT 0.5 08/24/2015 1748  GFRNONAA 38* 08/24/2015 1748   GFRAA 44* 08/24/2015 1748    No results found for: SPEP, UPEP  Lab Results  Component Value Date   WBC 7.0 11/30/2015   NEUTROABS 4.7 11/30/2015   HGB 11.4* 11/30/2015   HCT 35.8* 11/30/2015   MCV 84.8 11/30/2015   PLT 331 11/30/2015      Chemistry      Component Value Date/Time   NA 140 11/30/2015 1454   NA 140 08/24/2015 1748   K 4.3 11/30/2015 1454   K 4.2 08/24/2015 1748   CL 107 08/24/2015 1748   CO2 21* 11/30/2015 1454   CO2 21* 08/24/2015 1748   BUN 23.9 11/30/2015 1454   BUN 21* 08/24/2015 1748   CREATININE 1.4* 11/30/2015 1454   CREATININE 1.73* 08/24/2015 1748   CREATININE 1.23* 01/22/2015 1542      Component Value Date/Time   CALCIUM 9.3 11/30/2015 1454    CALCIUM 8.6* 08/24/2015 1748   ALKPHOS 88 11/30/2015 1454   ALKPHOS 58 08/24/2015 1748   AST 16 11/30/2015 1454   AST 27 08/24/2015 1748   ALT 18 11/30/2015 1454   ALT 41 08/24/2015 1748   BILITOT <0.30 11/30/2015 1454   BILITOT 0.5 08/24/2015 1748     ASSESSMENT & PLAN:  Recurrent deep vein thrombosis (DVT) ([place]) The patient has recurrent thrombosis which are provoked. Recent ultrasound venous Doppler showed persistent clot but chronic changes. I do not feel strongly that the patient needs to go on chronic long-term anticoagulation treatment. I recommend he resume aspirin therapy but to increase the dose to 162 mg daily indefinitely. We discussed some of the high risk situation that could precipitate recurrent thrombosis such as trauma, surgery, prolonged immobilization, hormone replacement therapy and cancer. I do not have to see the patient back in the future but recommend him to call me if he needs perioperative aggressive DVT management The patient has mild achiness in his left leg which I suspect is due to mild thrombophlebitis/post phlebitis syndrome. I recommend elastic compression hose for minimum 2 years and I gave him a prescription. I have not made a return appointment for the patient to come back.  Anemia in chronic kidney disease This is likely anemia of chronic disease. The patient denies recent history of bleeding such as epistaxis, hematuria or hematochezia. He is asymptomatic from the anemia. We will observe for now.   Elevated serum creatinine This is likely due to chronic renal failure from aging or hypertension. Monitor closely   All questions were answered. The patient knows to call the clinic with any problems, questions or concerns. No barriers to learning was detected.  I spent 15 minutes counseling the patient face to face. The total time spent in the appointment was 20 minutes and more than 50% was on counseling.     [facility], Maddy Graham, MD 6/16/20174:24  PM

## 2015-11-30 NOTE — Assessment & Plan Note (Signed)
The patient has recurrent thrombosis which are provoked. Recent ultrasound venous Doppler showed persistent clot but chronic changes. I do not feel strongly that the patient needs to go on chronic long-term anticoagulation treatment. I recommend he resume aspirin therapy but to increase the dose to 162 mg daily indefinitely. We discussed some of the high risk situation that could precipitate recurrent thrombosis such as trauma, surgery, prolonged immobilization, hormone replacement therapy and cancer. I do not have to see the patient back in the future but recommend him to call me if he needs perioperative aggressive DVT management The patient has mild achiness in his left leg which I suspect is due to mild thrombophlebitis/post phlebitis syndrome. I recommend elastic compression hose for minimum 2 years and I gave him a prescription. I have not made a return appointment for the patient to come back.

## 2015-12-28 ENCOUNTER — Other Ambulatory Visit: Payer: Self-pay | Admitting: Family Medicine

## 2016-01-05 ENCOUNTER — Other Ambulatory Visit: Payer: Self-pay | Admitting: Family Medicine

## 2016-01-09 ENCOUNTER — Ambulatory Visit: Payer: Medicare Other | Admitting: Family Medicine

## 2016-01-10 ENCOUNTER — Ambulatory Visit (INDEPENDENT_AMBULATORY_CARE_PROVIDER_SITE_OTHER): Payer: Medicare Other | Admitting: Family Medicine

## 2016-01-10 ENCOUNTER — Encounter: Payer: Self-pay | Admitting: Family Medicine

## 2016-01-10 VITALS — BP 140/76 | HR 66 | Temp 98.2°F | Resp 18 | Ht 70.5 in | Wt 225.0 lb

## 2016-01-10 DIAGNOSIS — I2510 Atherosclerotic heart disease of native coronary artery without angina pectoris: Secondary | ICD-10-CM

## 2016-01-10 DIAGNOSIS — Z9861 Coronary angioplasty status: Secondary | ICD-10-CM

## 2016-01-10 DIAGNOSIS — E119 Type 2 diabetes mellitus without complications: Secondary | ICD-10-CM | POA: Diagnosis not present

## 2016-01-10 DIAGNOSIS — E039 Hypothyroidism, unspecified: Secondary | ICD-10-CM | POA: Diagnosis not present

## 2016-01-10 DIAGNOSIS — E785 Hyperlipidemia, unspecified: Secondary | ICD-10-CM | POA: Diagnosis not present

## 2016-01-10 DIAGNOSIS — I10 Essential (primary) hypertension: Secondary | ICD-10-CM | POA: Diagnosis not present

## 2016-01-10 LAB — COMPLETE METABOLIC PANEL WITH GFR
ALT: 12 U/L (ref 9–46)
AST: 11 U/L (ref 10–35)
Albumin: 4.3 g/dL (ref 3.6–5.1)
Alkaline Phosphatase: 64 U/L (ref 40–115)
BUN: 17 mg/dL (ref 7–25)
CO2: 26 mmol/L (ref 20–31)
Calcium: 9.5 mg/dL (ref 8.6–10.3)
Chloride: 106 mmol/L (ref 98–110)
Creat: 1.23 mg/dL — ABNORMAL HIGH (ref 0.70–1.18)
GFR, Est African American: 67 mL/min (ref >=60)
GFR, Est Non African American: 58 mL/min — ABNORMAL LOW (ref >=60)
Glucose, Bld: 96 mg/dL (ref 65–99)
Potassium: 4.8 mmol/L (ref 3.5–5.3)
Sodium: 139 mmol/L (ref 135–146)
Total Bilirubin: 0.3 mg/dL (ref 0.2–1.2)
Total Protein: 7.2 g/dL (ref 6.1–8.1)

## 2016-01-10 LAB — LIPID PANEL
Cholesterol: 157 mg/dL (ref 125–200)
HDL: 53 mg/dL (ref >=40)
LDL Cholesterol: 76 mg/dL (ref <130)
Total CHOL/HDL Ratio: 3 Ratio (ref <=5.0)
Triglycerides: 138 mg/dL (ref <150)
VLDL: 28 mg/dL (ref <30)

## 2016-01-10 LAB — TSH: TSH: 3.31 mIU/L (ref 0.40–4.50)

## 2016-01-10 LAB — POCT GLYCOSYLATED HEMOGLOBIN (HGB A1C): Hemoglobin A1C: 6.1

## 2016-01-10 MED ORDER — AMLODIPINE BESYLATE 10 MG PO TABS: ORAL_TABLET | ORAL | 1 refills | Status: DC

## 2016-01-10 MED ORDER — METOPROLOL SUCCINATE ER 25 MG PO TB24: ORAL_TABLET | ORAL | 1 refills | Status: DC

## 2016-01-10 MED ORDER — METFORMIN HCL 500 MG PO TABS: ORAL_TABLET | ORAL | 1 refills | Status: DC

## 2016-01-10 MED ORDER — LEVOTHYROXINE SODIUM 88 MCG PO TABS: ORAL_TABLET | ORAL | 1 refills | Status: DC

## 2016-01-10 NOTE — Patient Instructions (Addendum)
  Keep a record of your blood pressures outside of the office and if remaining over 140/90 - let me know, as we would need to change the doses.   Call your cardiologist to make sure you are not overdue for follow up and determine next appt.   Follow up with Dr. Dwyane Dee for your thyroid and diabetes, but I will check those labs today, and I did refill both her metformin and thyroid medication.  Follow-up with me in the next 6 months.   IF you received an x-ray today, you will receive an invoice from [facility]. Please contact [facility] at [phone] with questions or concerns regarding your invoice.   IF you received labwork today, you will receive an invoice from [employer]. Please contact [facility] at [phone] with questions or concerns regarding your invoice.   Our billing staff will not be able to assist you with questions regarding bills from these companies.  You will be contacted with the lab results as soon as they are available. The fastest way to get your results is to activate your My Chart account. Instructions are located on the last page of this paperwork. If you have not heard from [place] regarding the results in 2 weeks, please contact this office.

## 2016-01-10 NOTE — Progress Notes (Signed)
By signing my name below, I, [name], attest that this documentation has been prepared under the direction and in the presence of [name], MD.  Electronically Signed: Verlee Gutierrez, Medical Scribe. 01/10/16. 8:26 AM.  Subjective:    Patient ID: Carl Gutierrez, male    DOB: 03-22-1946, 70 y.o.   MRN: [id number]  HPI Chief Complaint  Patient presents with  . Medication Refill    6 MONTH FOLLOW UP/METOPROLOL,AMLODIPINE,LEVOTHYROXINE,METFORMIN    HPI Comments: Carl Gutierrez is a 70 y.o. male with a PMHx of DM, HLD, HTN, and who presents to the [facility] for medication refill. Most recently seen by me for a DVT in March. Pt is fasting this morning. Pt states he's taking all of his prescribed medication.  DM: Takes Metformin TID- splits it up. Last discussed in January- tolerating medications at that time, DM controlled. He was continued on same medications and told to follow-up with endocrinologist Dr. Dwyane Dee. Pt hasn't seen Dr. Dwyane Dee since 2 weeks before his prostate surgery around March. Pt hasn't been taking his blood sugar at home. Pt denies complications with his DM- no retinopathy, or neuropathy changes reported. Pt denies experiencing any side affects on his medication. Lab Results  Component Value Date   HGBA1C 5.9 (H) 08/15/2015    HTN: Elevated today at 160/80. Hx of coronary disease, with hx of PTCA followed by Dr. Ellyn Hack, Cath in 2008. He had some elevated borderline readings at home, so started Metoprolol low dose at 25 mg QD. Blood pressure at last visit in March was controlled at 117/76. Pt states his bp "varies" around 140/90. Pt didn't take his bp medication this morning. Pt hasn't seen Dr. Ellyn Hack this year- last saw him around Christmas. Pt denies experiencing any side affects on his medication. Lab Results  Component Value Date   CREATININE 1.4 (H) 11/30/2015    HLD: Takes Zocor 20 mg QD. Pt denies experiencing any side affects on his  medication. Lab Results  Component Value Date   CHOL 148 01/22/2015   HDL 45 01/22/2015   LDLCALC 81 01/22/2015   LDLDIRECT 85.0 05/18/2014   TRIG 108 01/22/2015   CHOLHDL 3.3 01/22/2015   Lab Results  Component Value Date   ALT 18 11/30/2015   AST 16 11/30/2015   ALKPHOS 88 11/30/2015   BILITOT <0.30 11/30/2015    Hypothyroidism: Followed by Dr. Dwyane Dee. Lab Results  Component Value Date   TSH 2.203 07/12/2012     Patient Active Problem List   Diagnosis Date Noted  . Anemia in chronic kidney disease 11/30/2015  . Recurrent deep vein thrombosis (DVT) ([place]) 10/01/2015  . Hematuria, unspecified 10/01/2015  . Elevated serum creatinine 10/01/2015  . BPH (benign prostatic hyperplasia) 08/13/2015  . Benign localized hyperplasia of prostate with urinary obstruction 07/23/2015  . Preop cardiovascular exam 07/17/2015  . Encounter for CDL (commercial driving license) exam 73/53/2992  . Obesity (BMI 30-39.9) 06/26/2013  . Essential hypertension 06/26/2013  . Panhypopituitarism ([place]) 11/17/2011  .   11/17/2011  . Diabetes mellitus ([place]) 11/17/2011  . Hyperlipidemia LDL goal <70 11/17/2011  . CAD S/P percutaneous coronary angioplasty 07/19/2002   Past Medical History:  Diagnosis Date  . CAD S/P percutaneous coronary angioplasty February 2004; 2008   PCI-dLAD: 2.25 mm x 16 mm Express BMS; Ramus- 2.75 mm x 18 mm Cypher DES (2.8 mm); follow-up 11/'08: Patent LAD/ramus stents. 90% small OM. EF 50-60%.; Myoview 1/'12: Exercise 9 min, 10 METS with no ischemia/infarction.   Marland Kitchen  Diabetes mellitus    Low-dose metformin  . DJD (degenerative joint disease), lumbosacral     status post L4-L5 surgery  . Essential hypertension   . History of DVT of lower extremity     postoperative  . Hyperlipidemia with target LDL less than 70   . Hypothyroidism    Synthroid  . Panhypopituitarism ([place])    Status post pituitary adenoma removal in 2012  . Recurrent deep vein thrombosis (DVT) ([place])  10/01/2015   Past Surgical History:  Procedure Laterality Date  . BRAIN SURGERY  2012   Removal of pituitary adenoma  . CARDIAC CATHETERIZATION  November 2008   Significant LAD tapering but no significant disease the distal stent. Patent Ramus stent. 90% lesion in small OM 2; small nondominant RCA. Medical therapy. EF 50-60%.  . CORONARY ANGIOPLASTY WITH STENT PLACEMENT  February 2004   PCI-dLAD: 2.25 mm x 16 mm BMS;;PCI- RI: 2.75 mm 18 mm Cypher DES  . NM MYOVIEW LTD  January 2012   Exercise ~9 min - 10 METS; no ischemia or infarction  . PROSTATECTOMY N/A 08/13/2015   Procedure: TRANSVESICLE OPEN PROSTATECTOMY** ;  Surgeon: Carolan Clines, MD;  Location: [facility];  Service: Urology;  Laterality: N/A;  . SPINE SURGERY     L4-L5   Allergies  Allergen Reactions  . Hydrochlorothiazide     Leg and side pain with this  . Other     Blood pressure med name unknown.   Prior to Admission medications   Medication Sig Start Date End Date Taking? Authorizing Provider  amLODipine (NORVASC) 10 MG tablet TAKE 1 TABLET (10 MG TOTAL) BY MOUTH DAILY. 01/01/16  Yes Wendie Agreste, MD  apixaban (ELIQUIS) 5 MG TABS tablet Take 1 tablet (5 mg total) by mouth 2 (two) times daily. 09/05/15  Yes Wendie Agreste, MD  apixaban Arne [place]) KIT 1 kit by Does not apply route once. 08/24/15  Yes Forde Dandy, MD  aspirin 81 MG tablet Take 81 mg by mouth daily. Reported on 09/05/2015   Yes Historical Provider, MD  hydrocortisone (CORTEF) 20 MG tablet Take 10-20 mg by mouth 2 (two) times daily. Takes 1 tablet in the morning and 1/2 tablet at night   Yes Historical Provider, MD  levothyroxine (SYNTHROID, LEVOTHROID) 88 MCG tablet TAKE 1 TABLET (88 MCG TOTAL) BY MOUTH DAILY. 08/29/15  Yes Elayne Snare, MD  levothyroxine (SYNTHROID, LEVOTHROID) 88 MCG tablet TAKE 1 TABLET (88 MCG TOTAL) BY MOUTH DAILY. 08/31/15  Yes Elayne Snare, MD  lisinopril (PRINIVIL,ZESTRIL) 20 MG tablet TAKE 1 TABLET (20 MG TOTAL) BY MOUTH DAILY. 04/20/13   Yes Orma Flaming, MD  metFORMIN (GLUCOPHAGE) 500 MG tablet TAKE 3 TABLETS WITH EVENING MEAL DAILY 08/29/15  Yes Elayne Snare, MD  metFORMIN (GLUCOPHAGE) 500 MG tablet TAKE 3 TABLETS WITH EVENING MEAL DAILY 08/31/15  Yes Elayne Snare, MD  metoprolol succinate (TOPROL-XL) 25 MG 24 hr tablet TAKE 1 TABLET BY MOUTH DAILY. START WITH 1/2 PER DAY FOR 1 WEEK, THEN CAN INCREASE TO 1 PILL PER DAY 01/05/16  Yes Wendie Agreste, MD  senna (SENOKOT) 8.6 MG TABS tablet Take 1 tablet (8.6 mg total) by mouth 2 (two) times daily. 08/15/15  Yes Christell Faith, MD  simvastatin (ZOCOR) 40 MG tablet Take 20 mg by mouth every evening.    Yes Historical Provider, MD   Social History   Social History  . Marital status: Married    Spouse name: N/A  . Number of children: N/A  .  Years of education: N/A   Occupational History  . Not on file.   Social History Main Topics  . Smoking status: Former Smoker    Quit date: 06/24/1993  . Smokeless tobacco: Never Used  . Alcohol use No  . Drug use: No  . 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 Not on file   Social History Narrative   He is a married father of 2, grandfather 60. Exercises only occasionally. Not as much as he desires 2. 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   He does not smoke cigarettes -- quit in 1995. Does not drink alcohol.   Review of Systems  Constitutional: Negative for fatigue and unexpected weight change.  Eyes: Negative for visual disturbance.  Respiratory: Negative for cough, chest tightness and shortness of breath.   Cardiovascular: Negative for chest pain, palpitations and leg swelling.  Gastrointestinal: Negative for abdominal pain and blood in stool.  Neurological: Negative for dizziness, light-headedness, numbness and headaches.   Objective:  Physical Exam  Constitutional: He is oriented to person, place, and time. He appears well-developed and  well-nourished.  HENT:  Head: Normocephalic and atraumatic.  Eyes: EOM are normal. Pupils are equal, round, and reactive to light.  Neck: No JVD present. Carotid bruit is not present.  Cardiovascular: Normal rate, regular rhythm and normal heart sounds.  Exam reveals no gallop and no friction rub.   No murmur heard. Pulmonary/Chest: Effort normal and breath sounds normal. No respiratory distress. He has no wheezes. He has no rales.  Musculoskeletal: He exhibits no edema.  Neurological: He is alert and oriented to person, place, and time.  Skin: Skin is warm and dry.  Psychiatric: He has a normal mood and affect.  Vitals reviewed.  BP (!) 160/80   Pulse 66   Temp 98.2 F (36.8 C) (Oral)   Resp 18   Ht 5' 10.5" (1.791 m)   Wt 225 lb (102.1 kg)   SpO2 98%   BMI 31.83 kg/m   Assessment & Plan:   Delyle Weider is a 70 y.o. male Type 2 diabetes mellitus without complication, without long-term current use of insulin ([place]) - Plan: POCT glycosylated hemoglobin (Hb A1C), metFORMIN (GLUCOPHAGE) 500 MG tablet  - Prior controlled. Tolerating metformin. A1c pending. Follow-up with Dr. Dwyane Dee his endocrinologist, but will refill meds and check labs for now.  Essential hypertension - Plan: COMPLETE METABOLIC PANEL WITH GFR, amLODipine (NORVASC) 10 MG tablet, metoprolol succinate (TOPROL-XL) 25 MG 24 hr tablet  - Elevated in office today, controlled at last visit. Has not taken medications yet this morning. Check home blood pressure readings on medications, and if reading over 140/90, call or return to discuss changes. Tolerating current doses of medications, refilled.  Hyperlipemia - Plan: COMPLETE METABOLIC PANEL WITH GFR, Lipid panel  - Tolerating 20 mg of Zocor. Discussed any higher doses would need to change to different medication as he is on amlodipine. Will check lipids, CMP.  Hypothyroidism, unspecified hypothyroidism type - Plan: TSH, levothyroxine (SYNTHROID, LEVOTHROID) 88 MCG tablet  -  Check TSH, refilled Synthroid, advised to follow-up with his endocrinologist.  Meds ordered this encounter  Medications  . amLODipine (NORVASC) 10 MG tablet    Sig: TAKE 1 TABLET (10 MG TOTAL) BY MOUTH DAILY.    Dispense:  90 tablet    Refill:  1  . levothyroxine (SYNTHROID, LEVOTHROID) 88  MCG tablet    Sig: TAKE 1 TABLET (88 MCG TOTAL) BY MOUTH DAILY.    Dispense:  90 tablet    Refill:  1  . metFORMIN (GLUCOPHAGE) 500 MG tablet    Sig: TAKE 3 TABLETS WITH EVENING MEAL DAILY    Dispense:  270 tablet    Refill:  1  . metoprolol succinate (TOPROL-XL) 25 MG 24 hr tablet    Sig: TAKE 1 tablet by mouth each DAY    Dispense:  90 tablet    Refill:  1   Patient Instructions    Keep a record of your blood pressures outside of the office and if remaining over 140/90 - let me know, as we would need to change the doses.   Call your cardiologist to make sure you are not overdue for follow up and determine next appt.   Follow up with Dr. Dwyane Dee for your thyroid and diabetes, but I will check those labs today, and I did refill both her metformin and thyroid medication.  Follow-up with me in the next 6 months.   IF you received an x-ray today, you will receive an invoice from [facility]. Please contact [facility] at [phone] with questions or concerns regarding your invoice.   IF you received labwork today, you will receive an invoice from [employer]. Please contact [facility] at [phone] with questions or concerns regarding your invoice.   Our billing staff will not be able to assist you with questions regarding bills from these companies.  You will be contacted with the lab results as soon as they are available. The fastest way to get your results is to activate your My Chart account. Instructions are located on the last page of this paperwork. If you have not heard from [place] regarding the results in 2 weeks, please contact this office.        I personally performed the services described in this documentation, which was scribed in my presence. The recorded information has been reviewed and considered, and addended by me as needed.   Signed,   [name], MD [facility].  01/10/16 8:41 AM

## 2016-01-16 ENCOUNTER — Encounter: Payer: Self-pay | Admitting: *Deleted

## 2016-03-14 ENCOUNTER — Other Ambulatory Visit: Payer: Self-pay | Admitting: *Deleted

## 2016-03-14 DIAGNOSIS — E119 Type 2 diabetes mellitus without complications: Secondary | ICD-10-CM

## 2016-03-14 DIAGNOSIS — E230 Hypopituitarism: Secondary | ICD-10-CM

## 2016-03-17 ENCOUNTER — Other Ambulatory Visit (INDEPENDENT_AMBULATORY_CARE_PROVIDER_SITE_OTHER): Payer: Medicare Other

## 2016-03-17 DIAGNOSIS — E230 Hypopituitarism: Secondary | ICD-10-CM

## 2016-03-17 DIAGNOSIS — E119 Type 2 diabetes mellitus without complications: Secondary | ICD-10-CM | POA: Diagnosis not present

## 2016-03-17 LAB — BASIC METABOLIC PANEL
BUN: 21 mg/dL (ref 6–23)
CO2: 25 mEq/L (ref 19–32)
Calcium: 8.9 mg/dL (ref 8.4–10.5)
Chloride: 107 mEq/L (ref 96–112)
Creatinine, Ser: 1.14 mg/dL (ref 0.40–1.50)
GFR: 81.14 mL/min (ref >60.00)
Glucose, Bld: 108 mg/dL — ABNORMAL HIGH (ref 70–99)
Potassium: 4.5 mEq/L (ref 3.5–5.1)
Sodium: 140 mEq/L (ref 135–145)

## 2016-03-17 LAB — TESTOSTERONE: Testosterone: 47.48 ng/dL — ABNORMAL LOW (ref 300.00–890.00)

## 2016-03-20 ENCOUNTER — Ambulatory Visit: Payer: Medicare Other | Admitting: Endocrinology

## 2016-03-24 DIAGNOSIS — Z23 Encounter for immunization: Secondary | ICD-10-CM | POA: Diagnosis not present

## 2016-03-27 ENCOUNTER — Other Ambulatory Visit: Payer: Self-pay | Admitting: Family Medicine

## 2016-04-04 ENCOUNTER — Other Ambulatory Visit: Payer: Self-pay | Admitting: Family Medicine

## 2016-04-14 ENCOUNTER — Encounter: Payer: Self-pay | Admitting: Endocrinology

## 2016-04-14 ENCOUNTER — Ambulatory Visit (INDEPENDENT_AMBULATORY_CARE_PROVIDER_SITE_OTHER): Payer: Medicare Other | Admitting: Endocrinology

## 2016-04-14 VITALS — BP 136/84 | HR 83 | Ht 71.0 in | Wt 230.0 lb

## 2016-04-14 DIAGNOSIS — I2510 Atherosclerotic heart disease of native coronary artery without angina pectoris: Secondary | ICD-10-CM

## 2016-04-14 DIAGNOSIS — Z9861 Coronary angioplasty status: Secondary | ICD-10-CM | POA: Diagnosis not present

## 2016-04-14 DIAGNOSIS — E230 Hypopituitarism: Secondary | ICD-10-CM

## 2016-04-14 DIAGNOSIS — E119 Type 2 diabetes mellitus without complications: Secondary | ICD-10-CM | POA: Diagnosis not present

## 2016-04-14 NOTE — Progress Notes (Signed)
Carl Gutierrez is a 70 y.o. male.            Chief complaint: Followup   History of Present Illness:  HYPOPITUITARISM: This is secondary to his pituitary tumor which was treated surgically in 07/2010 He has usually been asymptomatic with his multiple hormone deficiencies except at the time of diagnosis.  History of hypogonadism at baseline levels of about 18.  Initially had decreased libido, some fatigue and also erectile dysfunction His previous testosterone levels have ranged from 64-252 and did not get adequate levels with AndroGel because of poor compliance.  Marland Kitchen  He was switched to testosterone injections and had variable compliance with cutting for injections  His testosterone level previously was over 400 with injections but on his last visit the level was relatively low about 2 weeks after his 300 mg dosage Records show that his last injection was in January 2017  Subsequently has not come back for injections He had DVT after his prostate surgery in February but he was told by the emergency room doctor in March that his DVT was from testosterone injections and not to take them any more, however had not been taking them for nearly 2 months anyway  Again he does does not feel any different since stopping the testosterone injections  and does not complain of any fatigue or libido changes  Lab Results  Component Value Date   TESTOSTERONE 47.48 Repeated and verified X2. (L) 03/17/2016    HYPOTHYROIDISM, secondary:  He has secondary hypothyroidism and is currently taking 88 mcg daily.   His dose was reduced in 9/15 because of relatively higher free T4 level  No fatigue or cold intolerance  Free T4 level Has not been checked this year   Lab Results  Component Value Date   FREET4 1.12 02/16/2015   FREET4 0.90 05/18/2014   FREET4 1.34 02/14/2014   FREET4 1.09 10/03/2013    Has not had growth hormone deficiency when previously tested       Medication List       Accurate  as of 04/14/16  4:11 PM. Always use your most recent med list.          amLODipine 10 MG tablet Commonly known as:  NORVASC TAKE 1 TABLET (10 MG TOTAL) BY MOUTH DAILY.   apixaban 5 MG Tabs tablet Commonly known as:  ELIQUIS Take 1 tablet (5 mg total) by mouth 2 (two) times daily.   apixaban Kit Commonly known as:  ELIQUIS 1 kit by Does not apply route once.   aspirin 81 MG tablet Take 81 mg by mouth daily. Reported on 09/05/2015   hydrocortisone 20 MG tablet Commonly known as:  CORTEF Take 10-20 mg by mouth 2 (two) times daily. Takes 1 tablet in the morning and 1/2 tablet at night   levothyroxine 88 MCG tablet Commonly known as:  SYNTHROID, LEVOTHROID TAKE 1 TABLET (88 MCG TOTAL) BY MOUTH DAILY.   lisinopril 20 MG tablet Commonly known as:  PRINIVIL,ZESTRIL TAKE 1 TABLET (20 MG TOTAL) BY MOUTH DAILY.   metFORMIN 500 MG tablet Commonly known as:  GLUCOPHAGE TAKE 3 TABLETS WITH EVENING MEAL DAILY   metoprolol succinate 25 MG 24 hr tablet Commonly known as:  TOPROL-XL TAKE 1 TABLET BY MOUTH DAILY. START WITH ONE HALF TAB DAILY FOR 1ST WEEK, THEN TAKE 1 TAB DAILY   senna 8.6 MG Tabs tablet Commonly known as:  SENOKOT Take 1 tablet (8.6 mg total) by mouth 2 (two) times daily.  Allergies:  Allergies  Allergen Reactions  . Hydrochlorothiazide     Leg and side pain with this  . Other     Blood pressure med name unknown.    Past Medical History:  Diagnosis Date  . CAD S/P percutaneous coronary angioplasty February 2004; 2008   PCI-dLAD: 2.25 mm x 16 mm Express BMS; Ramus- 2.75 mm x 18 mm Cypher DES (2.8 mm); follow-up 11/'08: Patent LAD/ramus stents. 90% small OM. EF 50-60%.; Myoview 1/'12: Exercise 9 min, 10 METS with no ischemia/infarction.   . Diabetes mellitus    Low-dose metformin  . DJD (degenerative joint disease), lumbosacral     status post L4-L5 surgery  . Essential hypertension   . History of DVT of lower extremity     postoperative  .  Hyperlipidemia with target LDL less than 70   . Hypothyroidism    Synthroid  . Panhypopituitarism ([place])    Status post pituitary adenoma removal in 2012  . Recurrent deep vein thrombosis (DVT) ([place]) 10/01/2015    Past Surgical History:  Procedure Laterality Date  . BRAIN SURGERY  2012   Removal of pituitary adenoma  . CARDIAC CATHETERIZATION  November 2008   Significant LAD tapering but no significant disease the distal stent. Patent Ramus stent. 90% lesion in small OM 2; small nondominant RCA. Medical therapy. EF 50-60%.  . CORONARY ANGIOPLASTY WITH STENT PLACEMENT  February 2004   PCI-dLAD: 2.25 mm x 16 mm BMS;;PCI- RI: 2.75 mm 18 mm Cypher DES  . NM MYOVIEW LTD  January 2012   Exercise ~9 min - 10 METS; no ischemia or infarction  . PROSTATECTOMY N/A 08/13/2015   Procedure: TRANSVESICLE OPEN PROSTATECTOMY** ;  Surgeon: Carolan Clines, MD;  Location: [facility];  Service: Urology;  Laterality: N/A;  . SPINE SURGERY     L4-L5    Family History  Problem Relation Age of Onset  . Cancer Mother     pancreatic ca    Social History:  reports that he quit smoking about 22 years ago. He has never used smokeless tobacco. He reports that he does not drink alcohol or use drugs.  Review of Systems    DIABETES:  He has had mild diabetes for the last few years and treated by his primary care physician with  metformin only  When his A1c was up to 7.4 in 9/15 he was told to increase his metformin to 3 tablets a day He was seen by the dietitian in 6/14 and was instructed on cut back on high fat foods, balanced breakfast  Currently being followed by PCP with labs as follows:  Wt Readings from Last 3 Encounters:  04/14/16 230 lb (104.3 kg)  01/10/16 225 lb (102.1 kg)  11/30/15 224 lb 9.6 oz (101.9 kg)    Lab Results  Component Value Date   HGBA1C 6.1 01/10/2016   HGBA1C 5.9 (H) 08/15/2015   HGBA1C 6.3 07/12/2015   Lab Results  Component Value Date   MICROALBUR 0.8 07/12/2015    LDLCALC 76 01/10/2016   CREATININE 1.14 03/17/2016   HYPERCHOLESTEROLEMIA: Treated with simvastatin  Lab Results  Component Value Date   CHOL 157 01/10/2016   HDL 53 01/10/2016   LDLCALC 76 01/10/2016   LDLDIRECT 85.0 05/18/2014   TRIG 138 01/10/2016   CHOLHDL 3.0 01/10/2016     EXAM:  BP 136/84   Pulse 83   Ht '5\' 11"'$  (1.803 m)   Wt 230 lb (104.3 kg)   SpO2 96%   BMI  32.08 kg/m     Assessment/Plan:   PANHYPOPITUITARISM: He has multiple Persistent hormone defects.      Hypogonadism: He has  been previously asymptomatic even with  very low testosterone levels.  He has not been seen for a year As discussed above he has not taken any injections in January Review of his records indicate that despite not having any testosterone injections since January he was told at the emergency room that he had DVT from the testosterone He appears to have had DVT after his prostate surgery late every  Discussed recent importance of testosterone hormone replacement long-term identified that his levels are very low However he refuses to consider starting any treatment now  Secondary hypothyroidism: His free T4 will need to be rechecked  Secondary adrenal insufficiency. His blood pressure is  good  without orthostatic symptoms Recent electrolytes normal  Nyliah Nierenberg 04/14/2016, 4:11 PM

## 2016-05-23 ENCOUNTER — Other Ambulatory Visit (INDEPENDENT_AMBULATORY_CARE_PROVIDER_SITE_OTHER): Payer: Medicare Other

## 2016-05-23 DIAGNOSIS — E230 Hypopituitarism: Secondary | ICD-10-CM

## 2016-05-23 DIAGNOSIS — E119 Type 2 diabetes mellitus without complications: Secondary | ICD-10-CM | POA: Diagnosis not present

## 2016-05-23 LAB — COMPREHENSIVE METABOLIC PANEL
ALT: 14 U/L (ref 0–53)
AST: 14 U/L (ref 0–37)
Albumin: 4.3 g/dL (ref 3.5–5.2)
Alkaline Phosphatase: 62 U/L (ref 39–117)
BUN: 16 mg/dL (ref 6–23)
CO2: 27 mEq/L (ref 19–32)
Calcium: 9.3 mg/dL (ref 8.4–10.5)
Chloride: 106 mEq/L (ref 96–112)
Creatinine, Ser: 1.18 mg/dL (ref 0.40–1.50)
GFR: 77.93 mL/min (ref >60.00)
Glucose, Bld: 117 mg/dL — ABNORMAL HIGH (ref 70–99)
Potassium: 4.6 mEq/L (ref 3.5–5.1)
Sodium: 140 mEq/L (ref 135–145)
Total Bilirubin: 0.3 mg/dL (ref 0.2–1.2)
Total Protein: 7.4 g/dL (ref 6.0–8.3)

## 2016-05-23 LAB — URINALYSIS, ROUTINE W REFLEX MICROSCOPIC
Bilirubin Urine: NEGATIVE
Hgb urine dipstick: NEGATIVE
Ketones, ur: NEGATIVE
Leukocytes, UA: NEGATIVE
Nitrite: NEGATIVE
RBC / HPF: NONE SEEN (ref 0–?)
Specific Gravity, Urine: 1.02 (ref 1.000–1.030)
Total Protein, Urine: NEGATIVE
Urine Glucose: NEGATIVE
Urobilinogen, UA: 0.2 (ref 0.0–1.0)
pH: 5.5 (ref 5.0–8.0)

## 2016-05-23 LAB — TSH: TSH: 1.51 u[IU]/mL (ref 0.35–4.50)

## 2016-05-23 LAB — MICROALBUMIN / CREATININE URINE RATIO
Creatinine,U: 137.8 mg/dL
Microalb Creat Ratio: 0.9 mg/g (ref 0.0–30.0)
Microalb, Ur: 1.2 mg/dL (ref 0.0–1.9)

## 2016-05-23 LAB — T4, FREE: Free T4: 1.16 ng/dL (ref 0.60–1.60)

## 2016-05-23 LAB — HEMOGLOBIN A1C: Hgb A1c MFr Bld: 6.4 % (ref 4.6–6.5)

## 2016-05-23 LAB — TESTOSTERONE: Testosterone: 54 ng/dL — ABNORMAL LOW (ref 300.00–890.00)

## 2016-05-28 ENCOUNTER — Ambulatory Visit (INDEPENDENT_AMBULATORY_CARE_PROVIDER_SITE_OTHER): Payer: Medicare Other | Admitting: Endocrinology

## 2016-05-28 ENCOUNTER — Encounter: Payer: Self-pay | Admitting: Endocrinology

## 2016-05-28 VITALS — BP 140/86 | HR 67 | Ht 71.0 in | Wt 234.0 lb

## 2016-05-28 DIAGNOSIS — E230 Hypopituitarism: Secondary | ICD-10-CM | POA: Diagnosis not present

## 2016-05-28 DIAGNOSIS — Z9861 Coronary angioplasty status: Secondary | ICD-10-CM

## 2016-05-28 DIAGNOSIS — E1165 Type 2 diabetes mellitus with hyperglycemia: Secondary | ICD-10-CM

## 2016-05-28 DIAGNOSIS — I2510 Atherosclerotic heart disease of native coronary artery without angina pectoris: Secondary | ICD-10-CM | POA: Diagnosis not present

## 2016-05-28 NOTE — Patient Instructions (Signed)
Walk 2x daily  Low Carb lo fat diet

## 2016-05-28 NOTE — Progress Notes (Signed)
Carl Gutierrez is a 70 y.o. male.            Chief complaint: Followup .  His problems  History of Present Illness:  HYPOPITUITARISM: This is secondary to his pituitary tumor which was treated surgically in 07/2010 He has usually been asymptomatic with his multiple hormone deficiencies except at the time of diagnosis.  History of hypogonadism at baseline levels of about 18.  Initially had decreased libido, some fatigue and also erectile dysfunction His previous testosterone levels have ranged from 64-252 and did not get adequate levels with AndroGel because of poor compliance.  Marland Kitchen  He was switched to testosterone injections and had variable compliance with cutting for injections  His testosterone level previously was over 400 with injections but on his last visit the level was relatively low about 2 weeks after his 300 mg dosage Records show that his last injection was in January 2017  He had DVT after his prostate surgery in February but he was told by the emergency room doctor in March that his DVT was from testosterone injections and not to take them any more, however had not been taking them for nearly 2 months anyway  Currently does not complain of any fatigue or issues with libido  He is persistently refusing to consider treatment despite very low levels  Lab Results  Component Value Date   TESTOSTERONE 54.00 (L) 05/23/2016    HYPOTHYROIDISM, secondary:  He has secondary hypothyroidism and is  taking 88 mcg Levothyroxine supplement daily.   His dose was reduced in 9/15 because of relatively higher free T4 level  No fatigue although has gained weight Free T4 level is consistently normal   Lab Results  Component Value Date   FREET4 1.16 05/23/2016   FREET4 1.12 02/16/2015   FREET4 0.90 05/18/2014   FREET4 1.34 02/14/2014    ADRENAL insufficiency:  He has been continued on steroid replacement with hydrocortisone 20 mg in the morning and 10 mg the evening He is  compliant with this No lightheadedness or nausea Electrolytes normal  Has not had growth hormone deficiency when previously tested       Medication List       Accurate as of 05/28/16  3:43 PM. Always use your most recent med list.          amLODipine 10 MG tablet Commonly known as:  NORVASC TAKE 1 TABLET (10 MG TOTAL) BY MOUTH DAILY.   apixaban 5 MG Tabs tablet Commonly known as:  ELIQUIS Take 1 tablet (5 mg total) by mouth 2 (two) times daily.   apixaban Kit Commonly known as:  ELIQUIS 1 kit by Does not apply route once.   aspirin 81 MG tablet Take 81 mg by mouth daily. Reported on 09/05/2015   hydrocortisone 20 MG tablet Commonly known as:  CORTEF Take 10-20 mg by mouth 2 (two) times daily. Takes 1 tablet in the morning and 1/2 tablet at night   levothyroxine 88 MCG tablet Commonly known as:  SYNTHROID, LEVOTHROID TAKE 1 TABLET (88 MCG TOTAL) BY MOUTH DAILY.   lisinopril 20 MG tablet Commonly known as:  PRINIVIL,ZESTRIL TAKE 1 TABLET (20 MG TOTAL) BY MOUTH DAILY.   metFORMIN 500 MG tablet Commonly known as:  GLUCOPHAGE TAKE 3 TABLETS WITH EVENING MEAL DAILY   metoprolol succinate 25 MG 24 hr tablet Commonly known as:  TOPROL-XL TAKE 1 TABLET BY MOUTH DAILY. START WITH ONE HALF TAB DAILY FOR 1ST WEEK, THEN TAKE 1 TAB DAILY   senna  8.6 MG Tabs tablet Commonly known as:  SENOKOT Take 1 tablet (8.6 mg total) by mouth 2 (two) times daily.       Allergies:  Allergies  Allergen Reactions  . Hydrochlorothiazide     Leg and side pain with this  . Other     Blood pressure med name unknown.    Past Medical History:  Diagnosis Date  . CAD S/P percutaneous coronary angioplasty February 2004; 2008   PCI-dLAD: 2.25 mm x 16 mm Express BMS; Ramus- 2.75 mm x 18 mm Cypher DES (2.8 mm); follow-up 11/'08: Patent LAD/ramus stents. 90% small OM. EF 50-60%.; Myoview 1/'12: Exercise 9 min, 10 METS with no ischemia/infarction.   . Diabetes mellitus    Low-dose  metformin  . DJD (degenerative joint disease), lumbosacral     status post L4-L5 surgery  . Essential hypertension   . History of DVT of lower extremity     postoperative  . Hyperlipidemia with target LDL less than 70   . Hypothyroidism    Synthroid  . Panhypopituitarism ([place])    Status post pituitary adenoma removal in 2012  . Recurrent deep vein thrombosis (DVT) ([place]) 10/01/2015    Past Surgical History:  Procedure Laterality Date  . BRAIN SURGERY  2012   Removal of pituitary adenoma  . CARDIAC CATHETERIZATION  November 2008   Significant LAD tapering but no significant disease the distal stent. Patent Ramus stent. 90% lesion in small OM 2; small nondominant RCA. Medical therapy. EF 50-60%.  . CORONARY ANGIOPLASTY WITH STENT PLACEMENT  February 2004   PCI-dLAD: 2.25 mm x 16 mm BMS;;PCI- RI: 2.75 mm 18 mm Cypher DES  . NM MYOVIEW LTD  January 2012   Exercise ~9 min - 10 METS; no ischemia or infarction  . PROSTATECTOMY N/A 08/13/2015   Procedure: TRANSVESICLE OPEN PROSTATECTOMY** ;  Surgeon: Carolan Clines, MD;  Location: [facility];  Service: Urology;  Laterality: N/A;  . SPINE SURGERY     L4-L5    Family History  Problem Relation Age of Onset  . Cancer Mother     pancreatic ca    Social History:  reports that he quit smoking about 22 years ago. He has never used smokeless tobacco. He reports that he does not drink alcohol or use drugs.  Review of Systems    DIABETES Type II:  He has had mild diabetes for the last few years and treated by his primary care physician with metformin only  When his A1c was up to 7.4 in 9/15 he was told to increase his metformin to 3 tablets a day He was seen by the dietitian in 6/14 and was instructed on cut back on high fat foods, to eat balanced breakfast   He does not want to check his blood sugars at home His A1c has gradually increase in his fasting glucose was 117 He has been off his diet recently and gained weight again  Walking a  little only, is not motivated    Wt Readings from Last 3 Encounters:  05/28/16 234 lb (106.1 kg)  04/14/16 230 lb (104.3 kg)  01/10/16 225 lb (102.1 kg)    Lab Results  Component Value Date   HGBA1C 6.4 05/23/2016   HGBA1C 6.1 01/10/2016   HGBA1C 5.9 (H) 08/15/2015   Lab Results  Component Value Date   MICROALBUR 1.2 05/23/2016   [place] 76 01/10/2016   CREATININE 1.18 05/23/2016    HYPERCHOLESTEROLEMIA: Treated with simvastatin and followed by PCP  Lab  Results  Component Value Date   CHOL 157 01/10/2016   HDL 53 01/10/2016   LDLCALC 76 01/10/2016   LDLDIRECT 85.0 05/18/2014   TRIG 138 01/10/2016   CHOLHDL 3.0 01/10/2016     EXAM:  BP 140/86   Pulse 67   Ht _0  (1.803 m)   Wt 234 lb (106.1 kg)   SpO2 96%   BMI 32.64 kg/m     Assessment/Plan:   PANHYPOPITUITARISM: He has multiple Persistent hormone defects.      Hypogonadism: He has  been previously asymptomatic even with  very low testosterone levels.  Previously treated with injectable testosterone, has not taken any since 1/17 His testosterone level is only 54 Discussed his low levels but he thinks that he is asymptomatic and doesn't want to treat the low levels   Secondary hypothyroidism: His free T4 is stable and he will continue 88 g  Secondary adrenal insufficiency.  Subjectively doing well and no orthostasis  DIABETES:  Recommended increasing his metformin to 2000 mg but he refuses He thinks he can do better with his diet and try to lose weight Discussed A1c results and this is trending higher now He also refuses to consider home glucose monitoring  Kaitlinn Iversen 05/28/2016, 3:43 PM

## 2016-07-24 ENCOUNTER — Encounter: Payer: Self-pay | Admitting: Cardiology

## 2016-07-24 ENCOUNTER — Ambulatory Visit (INDEPENDENT_AMBULATORY_CARE_PROVIDER_SITE_OTHER): Payer: Medicare Other | Admitting: Cardiology

## 2016-07-24 VITALS — BP 156/77 | HR 64 | Ht 69.5 in | Wt 235.8 lb

## 2016-07-24 DIAGNOSIS — Z9861 Coronary angioplasty status: Secondary | ICD-10-CM | POA: Diagnosis not present

## 2016-07-24 DIAGNOSIS — E785 Hyperlipidemia, unspecified: Secondary | ICD-10-CM | POA: Diagnosis not present

## 2016-07-24 DIAGNOSIS — I10 Essential (primary) hypertension: Secondary | ICD-10-CM

## 2016-07-24 DIAGNOSIS — I2510 Atherosclerotic heart disease of native coronary artery without angina pectoris: Secondary | ICD-10-CM | POA: Diagnosis not present

## 2016-07-24 DIAGNOSIS — E669 Obesity, unspecified: Secondary | ICD-10-CM | POA: Diagnosis not present

## 2016-07-24 MED ORDER — AMLODIPINE BESYLATE 10 MG PO TABS
10.0000 mg | ORAL_TABLET | Freq: Every day | ORAL | 3 refills | Status: DC
Start: 2016-07-24 — End: 2017-09-22

## 2016-07-24 MED ORDER — LISINOPRIL 20 MG PO TABS
ORAL_TABLET | ORAL | 3 refills | Status: DC
Start: 2016-07-24 — End: 2019-09-22

## 2016-07-24 MED ORDER — METOPROLOL SUCCINATE ER 25 MG PO TB24: 25.0000 mg | ORAL_TABLET | Freq: Every day | ORAL | 3 refills | Status: DC

## 2016-07-24 NOTE — Progress Notes (Signed)
PCP: Wendie Agreste, MD  Clinic Note: Chief Complaint  Patient presents with  . Follow-up    1 year; Pt states no Sx.   . Coronary Artery Disease    HPI: Carl Gutierrez is a 71 y.o. male with a PMH below who presents today for annual follow-up of CAD-PCI and cardiac risk factors. He also carries a diagnosis of panhypopituitarism.  Carl Gutierrez was last seen in January 2016. He is doing relatively well with no major complaints. Little bit of exertional dyspnea if he really pushes it.  He has not been on beta blocker due to history of bradycardia. His ambulation was limited more by knee pain  Recent Hospitalizations:  - Surgery - Prostatectomy -- followed by L peroneal,PT, Fem & Common Fem V DVT in March-June only Pop V  Studies Reviewed:   Myoview, February 2017: No Ischemia/Infarction   Interval History: Carl Gutierrez is doing quite well without any major complaints. He remains active and denies any cardiac symptoms such as resting or exertional chest discomfort or dyspnea.  No PND, orthopnea or edema.  His myalgias seem to have improved having stopped statin. He did restart. He still has bilateral knee pain with some thigh pain which limits ability to walk for exercise. He is still active, and tries to walk as much the can. He is still working full schedule as a Programmer, multimedia.  From a cardiovascular symptom standpoint, he has been relatively stable. Review of symptoms as follows:  No palpitations, lightheadedness, dizziness, weakness or syncope/near syncope. No TIA/amaurosis fugax symptoms. No claudication.  He is recovering from a cold now has been taking some cold medicine. He thinks it may be contributing to his blood pressure being high today. He also had a rush him work to get here in time for his appointment. He says at home his blood pressures are better controlled.  ROS: A comprehensive was performed. Review of Systems  Constitutional: Negative for diaphoresis and  weight loss (Unfortunately gained some of the weight back).  Respiratory: Negative for cough and wheezing.   Cardiovascular: Negative for chest pain and claudication.  Gastrointestinal: Negative for blood in stool, nausea and vomiting.  Genitourinary: Negative for dysuria, flank pain and hematuria.  Musculoskeletal: Positive for joint pain (mostly knees) and myalgias (occasionally in the thighs, probably related to the knees.).  Neurological: Negative for dizziness, focal weakness, seizures, loss of consciousness and weakness.  Endo/Heme/Allergies: Does not bruise/bleed easily.  Psychiatric/Behavioral: Negative for depression.  All other systems reviewed and are negative.   Past Medical History:  Diagnosis Date  . CAD S/P percutaneous coronary angioplasty February 2004; 2008   PCI-dLAD: 2.25 mm x 16 mm Express BMS; Ramus- 2.75 mm x 18 mm Cypher DES (2.8 mm); follow-up 11/'08: Patent LAD/ramus stents. 90% small OM. EF 50-60%.; Myoview 1/'12: Exercise 9 min, 10 METS with no ischemia/infarction.   . Diabetes mellitus    Low-dose metformin  . DJD (degenerative joint disease), lumbosacral     status post L4-L5 surgery  . Essential hypertension   . History of DVT of lower extremity     postoperative  . Hyperlipidemia with target LDL less than 70   . Hypothyroidism    Synthroid  . Panhypopituitarism ([place])    Status post pituitary adenoma removal in 2012  . Recurrent deep vein thrombosis (DVT) ([place]) 10/01/2015    Past Surgical History:  Procedure Laterality Date  . BRAIN SURGERY  2012   Removal of pituitary adenoma  . CARDIAC CATHETERIZATION  November 2008   Significant LAD tapering but no significant disease the distal stent. Patent Ramus stent. 90% lesion in small OM 2; small nondominant RCA. Medical therapy. EF 50-60%.  . CORONARY ANGIOPLASTY WITH STENT PLACEMENT  February 2004   PCI-dLAD: 2.25 mm x 16 mm BMS;;PCI- RI: 2.75 mm 18 mm Cypher DES  . NM MYOVIEW LTD  January 2012    Exercise ~9 min - 10 METS; no ischemia or infarction  . PROSTATECTOMY N/A 08/13/2015   Procedure: TRANSVESICLE OPEN PROSTATECTOMY** ;  Surgeon: Carolan Clines, MD;  Location: [facility];  Service: Urology;  Laterality: N/A;  . SPINE SURGERY     L4-L5   Current Meds  Medication Sig  . amLODipine (NORVASC) 10 MG tablet Take 1 tablet (10 mg total) by mouth daily.  Marland Kitchen aspirin 81 MG tablet Take 81 mg by mouth daily. Reported on 09/05/2015  . hydrocortisone (CORTEF) 20 MG tablet Take 10-20 mg by mouth 2 (two) times daily. Takes 1 tablet in the morning and 1/2 tablet at night  . levothyroxine (SYNTHROID, LEVOTHROID) 88 MCG tablet TAKE 1 TABLET (88 MCG TOTAL) BY MOUTH DAILY.  Marland Kitchen lisinopril (PRINIVIL,ZESTRIL) 20 MG tablet TAKE 1 TABLET (20 MG TOTAL) BY MOUTH DAILY.  . metFORMIN (GLUCOPHAGE) 500 MG tablet TAKE 3 TABLETS WITH EVENING MEAL DAILY  . metoprolol succinate (TOPROL-XL) 25 MG 24 hr tablet Take 1 tablet (25 mg total) by mouth daily.  . [DISCONTINUED] amLODipine (NORVASC) 10 MG tablet TAKE 1 TABLET (10 MG TOTAL) BY MOUTH DAILY.  . [DISCONTINUED] lisinopril (PRINIVIL,ZESTRIL) 20 MG tablet TAKE 1 TABLET (20 MG TOTAL) BY MOUTH DAILY.  . [DISCONTINUED] metoprolol succinate (TOPROL-XL) 25 MG 24 hr tablet TAKE 1 TABLET BY MOUTH DAILY. START WITH ONE HALF TAB DAILY FOR 1ST WEEK, THEN TAKE 1 TAB DAILY   Current Facility-Administered Medications for the 07/24/16 encounter (Office Visit) with Leonie Man, MD  Medication  . testosterone cypionate (DEPOTESTOTERONE CYPIONATE) injection 350 mg     Allergies  Allergen Reactions  . Hydrochlorothiazide     Leg and side pain with this  . Other     Blood pressure med name unknown.    Social History   Social History  . Marital status: Married    Spouse name: N/A  . Number of children: N/A  . Years of education: N/A   Social History Main Topics  . Smoking status: Former Smoker    Quit date: 06/24/1993  . Smokeless tobacco: Never Used  . Alcohol use  No  . Drug use: No  . 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 None   Social History Narrative   He is a married father of 2, grandfather 45. Exercises only occasionally. Not as much as he desires 2. 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   He does not smoke cigarettes -- quit in 1995. Does not drink alcohol.    Family History  Problem Relation Age of Onset  . Cancer Mother     pancreatic ca    Wt Readings from Last 3 Encounters:  07/24/16 107 kg (235 lb 12.8 oz)  05/28/16 106.1 kg (234 lb)  04/14/16 104.3 kg (230 lb)    PHYSICAL EXAM BP (!) 156/77   Pulse 64   Ht 5' 9.5" (1.765 m)   Wt 107 kg (235 lb 12.8 oz)   BMI 34.32 kg/m  Neck: no  adenopathy, no carotid bruit, no JVD, supple, symmetrical, trachea midline and thyroid not enlarged, symmetric, no tenderness/mass/nodules HEENT: [place]/AT, EOMI, MMM, anicteric sclera Lungs: clear to auscultation bilaterally, normal percussion bilaterally and Nonlabored, good air movement Heart: regular rate and rhythm, S1, S2 normal, no murmur, click, rub or gallop and normal apical impulse Abdomen: soft, non-tender; bowel sounds normal; no masses, no organomegaly Extremities: extremities normal, atraumatic, no cyanosis or edema Pulses: 2+ and symmetric Neurologic: Alert and oriented X 3, normal strength and tone. Normal symmetric reflexes. Normal coordination and gait   Adult ECG Report  Rate: 64;  Rhythm: sinus bradycardia and With 1 A-VB;   Narrative Interpretation: Stable, otherwise normal EKG with sinus bradycardia and first-degree AV block.   Other studies Reviewed: Additional studies/ records that were reviewed today include:  Recent Labs:   Lab Results  Component Value Date   CHOL 157 01/10/2016   HDL 53 01/10/2016   LDLCALC 76 01/10/2016   LDLDIRECT 85.0 05/18/2014   TRIG 138 01/10/2016   CHOLHDL 3.0 01/10/2016    ASSESSMENT /  PLAN: Problem List Items Addressed This Visit    CAD S/P percutaneous coronary angioplasty (Chronic)    No recurrent anginal symptoms. Remains on aspirin, beta blocker, ACE inhibitor and calcium channel blocker. No longer on statin because of myalgias. 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 Did well with no ischemia.       Relevant Medications   lisinopril (PRINIVIL,ZESTRIL) 20 MG tablet   metoprolol succinate (TOPROL-XL) 25 MG 24 hr tablet   amLODipine (NORVASC) 10 MG tablet   Other Relevant Orders   EKG 12-Lead   Hyperlipidemia LDL goal <70 (Chronic)    He doesn't have a statin listed as a medication, and his most recent lipids from July 2017 showed that his LDL was still 19 which is pretty close to goal. This is been followed by his PCP. He should be due for having labs checked shortly when he has his physical. Depending what those levels look like, we may need to reconsider treatment options.      Relevant Medications   lisinopril (PRINIVIL,ZESTRIL) 20 MG tablet   metoprolol succinate (TOPROL-XL) 25 MG 24 hr tablet   amLODipine (NORVASC) 10 MG tablet   Obesity (BMI 30-39.9) (Chronic)    The patient understands the need to lose weight with diet and exercise. We have discussed specific strategies for this.      Essential hypertension - Primary (Chronic)    Blood pressure is low but high today, but he thinks is probably related to being on cold medicine. He also was rushing. Plan will be to follow his pressures at home, if they are elevated, would probably need to consider either converting from Toprol to carvedilol or adding HCTZ.      Relevant Medications   lisinopril (PRINIVIL,ZESTRIL) 20 MG tablet   metoprolol succinate (TOPROL-XL) 25 MG 24 hr tablet   amLODipine (NORVASC) 10 MG tablet   Other Relevant Orders   EKG 12-Lead      Current medicines are reviewed at length with the patient today. (+/- concerns) he asked about simvastatin and concern  with knee pain Patient Instructions   KEEP  RECORDING OF BLOOD PRESSURE READING  CONTACT OFFICE IF BLOOD PRESSURE STAY ABOVE 140 /90   NO OTHER CHANGES WITH MEDICATIONS    Your physician wants you to follow-up in [place] DR Charnika Herbst. You will receive a reminder letter in the mail two months  in advance. If you don't receive a letter, please call our office to schedule the follow-up appointment.   If you need a refill on your cardiac medications before your next appointment, please call your pharmacy.     Studies Ordered:   Orders Placed This Encounter  Procedures  . EKG 12-Lead     Glenetta Hew, M.D., M.S. Interventional Cardiologist   Pager # [phone] Phone # [phone] [address] [place],  [address]

## 2016-07-24 NOTE — Patient Instructions (Addendum)
KEEP  RECORDING OF BLOOD PRESSURE READING  CONTACT OFFICE IF BLOOD PRESSURE STAY ABOVE 140 /90   NO OTHER CHANGES WITH MEDICATIONS    Your physician wants you to follow-up in 12 MONTHS WITH DR HARDING. You will receive a reminder letter in the mail two months in advance. If you don't receive a letter, please call our office to schedule the follow-up appointment.   If you need a refill on your cardiac medications before your next appointment, please call your pharmacy.

## 2016-07-26 ENCOUNTER — Encounter: Payer: Self-pay | Admitting: Cardiology

## 2016-07-26 NOTE — Assessment & Plan Note (Signed)
Blood pressure is low but high today, but he thinks is probably related to being on cold medicine. He also was rushing. Plan will be to follow his pressures at home, if they are elevated, would probably need to consider either converting from Toprol to carvedilol or adding HCTZ.

## 2016-07-26 NOTE — Assessment & Plan Note (Signed)
The patient understands the need to lose weight with diet and exercise. We have discussed specific strategies for this.  

## 2016-07-26 NOTE — Assessment & Plan Note (Signed)
He doesn't have a statin listed as a medication, and his most recent lipids from July 2017 showed that his LDL was still 27 which is pretty close to goal. This is been followed by his PCP. He should be due for having labs checked shortly when he has his physical. Depending what those levels look like, we may need to reconsider treatment options.

## 2016-07-26 NOTE — Assessment & Plan Note (Signed)
No recurrent anginal symptoms. Remains on aspirin, beta blocker, ACE inhibitor and calcium channel blocker. No longer on statin because of myalgias. 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 Did well with no ischemia.

## 2016-08-29 ENCOUNTER — Other Ambulatory Visit: Payer: Self-pay | Admitting: Family Medicine

## 2016-08-29 DIAGNOSIS — E039 Hypothyroidism, unspecified: Secondary | ICD-10-CM

## 2016-08-29 DIAGNOSIS — E119 Type 2 diabetes mellitus without complications: Secondary | ICD-10-CM

## 2016-09-19 ENCOUNTER — Other Ambulatory Visit (INDEPENDENT_AMBULATORY_CARE_PROVIDER_SITE_OTHER): Payer: Medicare Other

## 2016-09-19 DIAGNOSIS — E1165 Type 2 diabetes mellitus with hyperglycemia: Secondary | ICD-10-CM

## 2016-09-19 DIAGNOSIS — E230 Hypopituitarism: Secondary | ICD-10-CM

## 2016-09-19 LAB — BASIC METABOLIC PANEL
BUN: 21 mg/dL (ref 6–23)
CO2: 26 mEq/L (ref 19–32)
Calcium: 9.3 mg/dL (ref 8.4–10.5)
Chloride: 106 mEq/L (ref 96–112)
Creatinine, Ser: 1.22 mg/dL (ref 0.40–1.50)
GFR: 74.92 mL/min (ref >60.00)
Glucose, Bld: 108 mg/dL — ABNORMAL HIGH (ref 70–99)
Potassium: 4.5 mEq/L (ref 3.5–5.1)
Sodium: 139 mEq/L (ref 135–145)

## 2016-09-19 LAB — HEMOGLOBIN A1C: Hgb A1c MFr Bld: 6.6 % — ABNORMAL HIGH (ref 4.6–6.5)

## 2016-09-19 LAB — TESTOSTERONE: Testosterone: 61.77 ng/dL — ABNORMAL LOW (ref 300.00–890.00)

## 2016-09-19 LAB — T4, FREE: Free T4: 1.1 ng/dL (ref 0.60–1.60)

## 2016-09-24 ENCOUNTER — Ambulatory Visit (INDEPENDENT_AMBULATORY_CARE_PROVIDER_SITE_OTHER): Payer: Medicare Other | Admitting: Endocrinology

## 2016-09-24 ENCOUNTER — Encounter: Payer: Self-pay | Admitting: Endocrinology

## 2016-09-24 VITALS — BP 132/74 | HR 84 | Ht 70.0 in | Wt 241.0 lb

## 2016-09-24 DIAGNOSIS — I10 Essential (primary) hypertension: Secondary | ICD-10-CM | POA: Diagnosis not present

## 2016-09-24 DIAGNOSIS — Z9861 Coronary angioplasty status: Secondary | ICD-10-CM | POA: Diagnosis not present

## 2016-09-24 DIAGNOSIS — E230 Hypopituitarism: Secondary | ICD-10-CM | POA: Diagnosis not present

## 2016-09-24 DIAGNOSIS — I2510 Atherosclerotic heart disease of native coronary artery without angina pectoris: Secondary | ICD-10-CM | POA: Diagnosis not present

## 2016-09-24 DIAGNOSIS — E1165 Type 2 diabetes mellitus with hyperglycemia: Secondary | ICD-10-CM | POA: Diagnosis not present

## 2016-09-24 MED ORDER — SITAGLIP PHOS-METFORMIN HCL ER 100-1000 MG PO TB24: ORAL_TABLET | ORAL | 2 refills | Status: DC

## 2016-09-24 NOTE — Patient Instructions (Signed)
Walk daily  Low fat diet

## 2016-09-24 NOTE — Progress Notes (Addendum)
Carl Gutierrez is a 71 y.o. male.            Chief complaint: Followup of various problems  History of Present Illness:  HYPOPITUITARISM: This is secondary to his pituitary tumor which was treated surgically in 07/2010 He has usually been asymptomatic with his multiple hormone deficiencies except at the time of diagnosis.  History of hypogonadism at baseline levels of about 18.  Initially had decreased libido, some fatigue and also erectile dysfunction His previous testosterone levels have ranged from 64-252 and did not get adequate levels with AndroGel because of poor compliance.  Marland Kitchen  He was switched to testosterone injections and had variable compliance with getting his injections His testosterone level previously was over 400 with injections but on his last visit the level was relatively low about 2 weeks after his 300 mg dosage Records show that his last injection was in January 2017  Subsequently the patient had refused to take any treatment and does not think he feels any more fatigued with stopping the injections despite very low levels  Lab Results  Component Value Date   TESTOSTERONE 61.77 (L) 09/19/2016    HYPOTHYROIDISM, secondary:  He has secondary hypothyroidism and is  taking 88 mcg Levothyroxine supplement daily.   His dose was reduced in 9/15 because of relatively higher free T4 level  No fatigue although has gained weight Again Free T4 level is consistently normal   Lab Results  Component Value Date   FREET4 1.10 09/19/2016   FREET4 1.16 05/23/2016   FREET4 1.12 02/16/2015   FREET4 0.90 05/18/2014    ADRENAL insufficiency:  He has been continued on steroid replacement with hydrocortisone 20 mg in the morning and 10 mg the evening He is compliant with this No lightheadedness, Weakness or nausea Electrolytes normal  Has not had growth hormone deficiency when previously tested   DIABETES: See review of systems  Allergies as of 09/24/2016      Reactions     Hydrochlorothiazide    Leg and side pain with this   Other    Blood pressure med name unknown.      Medication List       Accurate as of 09/24/16  9:35 PM. Always use your most recent med list.          amLODipine 10 MG tablet Commonly known as:  NORVASC Take 1 tablet (10 mg total) by mouth daily.   aspirin 81 MG tablet Take 81 mg by mouth daily. Reported on 09/05/2015   hydrocortisone 20 MG tablet Commonly known as:  CORTEF Take 10-20 mg by mouth 2 (two) times daily. Takes 1 tablet in the morning and 1/2 tablet at night   levothyroxine 88 MCG tablet Commonly known as:  SYNTHROID, LEVOTHROID TAKE 1 TABLET BY MOUTH EVERY DAY   lisinopril 20 MG tablet Commonly known as:  PRINIVIL,ZESTRIL TAKE 1 TABLET (20 MG TOTAL) BY MOUTH DAILY.   metoprolol succinate 25 MG 24 hr tablet Commonly known as:  TOPROL-XL Take 1 tablet (25 mg total) by mouth daily.   SitaGLIPtin-MetFORMIN HCl [phone] MG Tb24 1 tab daily with dinner       Allergies:  Allergies  Allergen Reactions  . Hydrochlorothiazide     Leg and side pain with this  . Other     Blood pressure med name unknown.    Past Medical History:  Diagnosis Date  . CAD S/P percutaneous coronary angioplasty February 2004; 2008   PCI-dLAD: 2.25 mm x 16 mm Express  BMS; Ramus- 2.75 mm x 18 mm Cypher DES (2.8 mm); follow-up 11/'08: Patent LAD/ramus stents. 90% small OM. EF 50-60%.; Myoview 1/'12: Exercise 9 min, 10 METS with no ischemia/infarction.   . Diabetes mellitus    Low-dose metformin  . DJD (degenerative joint disease), lumbosacral     status post L4-L5 surgery  . Essential hypertension   . History of DVT of lower extremity     postoperative  . Hyperlipidemia with target LDL less than 70   . Hypothyroidism    Synthroid  . Panhypopituitarism ()    Status post pituitary adenoma removal in 2012  . Recurrent deep vein thrombosis (DVT) ([place]) 10/01/2015    Past Surgical History:  Procedure Laterality Date  .  BRAIN SURGERY  2012   Removal of pituitary adenoma  . CARDIAC CATHETERIZATION  November 2008   Significant LAD tapering but no significant disease the distal stent. Patent Ramus stent. 90% lesion in small OM 2; small nondominant RCA. Medical therapy. EF 50-60%.  . CORONARY ANGIOPLASTY WITH STENT PLACEMENT  February 2004   PCI-dLAD: 2.25 mm x 16 mm BMS;;PCI- RI: 2.75 mm 18 mm Cypher DES  . NM MYOVIEW LTD  January 2012   Exercise ~9 min - 10 METS; no ischemia or infarction  . PROSTATECTOMY N/A 08/13/2015   Procedure: TRANSVESICLE OPEN PROSTATECTOMY** ;  Surgeon: Carolan Clines, MD;  Location: [facility];  Service: Urology;  Laterality: N/A;  . SPINE SURGERY     L4-L5    Family History  Problem Relation Age of Onset  . Cancer Mother     pancreatic ca    Social History:  reports that he quit smoking about 23 years ago. He has never used smokeless tobacco. He reports that he does not drink alcohol or use drugs.  Review of Systems    DIABETES Type II:  He has had mild diabetes for the last few years and treated by his primary care physician with metformin only  When his A1c was up to 7.4 in 9/15 he was told to increase his metformin to 3 tablets a day  He was seen by the dietitian in 6/14 and was instructed on cut back on high fat foods, to eat balanced breakfast   He does not want to check his blood sugars at home His A1c has gradually Increased and now it is 6.6 Recently his fasting glucose was near normal indicating he has high postprandial readings  He has been off his diet But not making good choices when eating out frequently  and has gained weight again  Walking a little only, is still not motivated, he says that he is having some knee pain   Wt Readings from Last 3 Encounters:  09/24/16 241 lb (109.3 kg)  07/24/16 235 lb 12.8 oz (107 kg)  05/28/16 234 lb (106.1 kg)    Lab Results  Component Value Date   HGBA1C 6.6 (H) 09/19/2016   HGBA1C 6.4 05/23/2016   HGBA1C 6.1  01/10/2016   Lab Results  Component Value Date   MICROALBUR 1.2 05/23/2016   LDLCALC 76 01/10/2016   CREATININE 1.22 09/19/2016    HYPERCHOLESTEROLEMIA: Treated with simvastatin and followed by PCP, No recent labs available  Lab Results  Component Value Date   CHOL 157 01/10/2016   HDL 53 01/10/2016   LDLCALC 76 01/10/2016   LDLDIRECT 85.0 05/18/2014   TRIG 138 01/10/2016   CHOLHDL 3.0 01/10/2016    HYPERTENSION: Treated with amlodipine and lisinopril, appears controlled  EXAM:  BP 132/74   Pulse 84   Ht 5\' 10"  (1.778 m)   Wt 241 lb (109.3 kg)   BMI 34.58 kg/m   Blood pressure checked both sitting and standing today  Assessment/Plan:   PANHYPOPITUITARISM: He has multiple Persistent hormone defects.     Hypogonadism: He has  been asymptomatic even with  very low testosterone levels.  Previously treated with injectable testosterone And did not report any change in subjective energy level He has not taken any treatment since 1/17 His testosterone level is still very low as before Although he may have long-term effects of low testosterone he refuses to consider treatment again   Secondary hypothyroidism: His free T4 is stable and he will continue 88 g  Secondary adrenal insufficiency.  Subjectively doing well with hydrocortisone supplementation and no orthostasis  DIABETES:  He is again not able to comply with diet and exercise program for weight loss and his A1c is increasing He refuses to consider checking blood sugars at home Also declines consultation with dietitian as recommended This is despite his not watching his diet and eating out frequently  Discussed that he is having weight gain and progression of his diabetes with metformin alone Recommended changing metformin to Janumet 100/1000 daily and he agrees to try this We will need to follow-up in 3 months Given  patient education booklet on eating out  [facility] 09/24/2016, 9:35 PM   Counseling time  on subjects discussed above is over 50% of today's 25 minute visit

## 2016-10-23 ENCOUNTER — Telehealth: Payer: Self-pay | Admitting: Endocrinology

## 2016-10-23 NOTE — Telephone Encounter (Signed)
I contacted the patient and advised we did have a discount card he could come by and pick up. Card placed upfront for the patient to pick up.

## 2016-10-23 NOTE — Telephone Encounter (Signed)
Patient wife stated he need another co pay card for medication Janumet pharmacy stated it would be 400 dollars with out it. Please advise

## 2016-10-29 ENCOUNTER — Telehealth: Payer: Self-pay | Admitting: Family Medicine

## 2016-10-29 ENCOUNTER — Telehealth: Payer: Self-pay | Admitting: Endocrinology

## 2016-10-29 NOTE — Telephone Encounter (Signed)
[facility] called and said that the Discount Card for this patients medication only works for the free trial and he is having to pay for everything else now, she said the Janumet is $412 and the patient was going to pay that but they wanted to know if Dr. Dwyane Dee would be willing ot break it down into a script for the Januvia and the Metformin because it might be more cost efficient.  Pharmacy requests call back.

## 2016-10-29 NOTE — Telephone Encounter (Signed)
[facility] called back and said disregard this message.

## 2016-10-29 NOTE — Telephone Encounter (Signed)
Noted  

## 2016-10-29 NOTE — Telephone Encounter (Signed)
See message and please advise if ok to change during Dr. Ronnie Derby absence. Thanks!

## 2016-10-29 NOTE — Telephone Encounter (Signed)
error 

## 2016-11-13 ENCOUNTER — Encounter: Payer: Self-pay | Admitting: Family Medicine

## 2016-11-13 ENCOUNTER — Ambulatory Visit (INDEPENDENT_AMBULATORY_CARE_PROVIDER_SITE_OTHER): Payer: Medicare Other | Admitting: Family Medicine

## 2016-11-13 VITALS — BP 136/83 | HR 56 | Temp 97.4°F | Resp 18 | Ht 71.65 in | Wt 233.0 lb

## 2016-11-13 DIAGNOSIS — Z0000 Encounter for general adult medical examination without abnormal findings: Secondary | ICD-10-CM

## 2016-11-13 DIAGNOSIS — E1165 Type 2 diabetes mellitus with hyperglycemia: Secondary | ICD-10-CM | POA: Diagnosis not present

## 2016-11-13 NOTE — Progress Notes (Signed)
By signing my name below, I, [name], attest that this documentation has been prepared under the direction and in the presence of [name], MD.  Electronically Signed: [name], Medical Scribe. 11/13/16. 11:30 AM.  Subjective:    Patient ID: Carl Gutierrez, male    DOB: [birth year], 71 y.o.   MRN: [id number]  HPI Chief Complaint  Patient presents with  . Annual Exam    HPI Comments: Carl Gutierrez is a 71 y.o. male who presents to Primary Care at [facility] for his complete physical. He has a PMHx of DM, HTN, CAD, and lipoma. Followed by Dr Ellyn Hack.  HTN: Takes norvasc 10 mg QD, toprol-xl 25 mg QD, lisinopril 20 mg QD, and ASA 81 mg QD. Pt is no longer taking toprol-xl and was switched to a new medication, but isn't sure if he'll stay on it due to financial concerns. Lab Results  Component Value Date   CREATININE 1.22 09/19/2016   DM: He is followed by endocrinology, Dr. Dwyane Dee. He has a lipid panel, CNP, A1c and T4 pending for his next endocrine visit. Lab Results  Component Value Date   HGBA1C 6.6 (H) 09/19/2016   Lab Results  Component Value Date   MICROALBUR 1.2 05/23/2016   Cancer Screening: Prostate: Pt defers to DRE and blood work today. Lab Results  Component Value Date   PSA 5.07 (H) 04/02/2015   PSA 5.07 (H) 01/22/2015   PSA 2.26 07/12/2012  Colon: Colonoscopy reportedly done in 2011 at the [place], repeat in 5 years. Plans on going back to the [place] to get it done.  Immunizations: Pt has not had his shingles vaccine and deferred it. Immunization History  Administered Date(s) Administered  . Influenza-Unspecified 03/16/2013, 04/05/2014, 04/03/2015, 04/22/2016  . Pneumococcal Conjugate-13 01/22/2015  . Pneumococcal Polysaccharide-23 06/16/2009  . Tdap 06/16/2005   Hep C Screening: He has not had this performed. Pt deferred screening today.  Vision: Pt is followed by an ophthalmologist with his last appt was Nov 2016. Reports intermittent right eye  watering and itching. He took rx'ed drops from his ophtho in the past for relief of his sxs.   Visual Acuity Screening   Right eye Left eye Both eyes  Without correction: 20/40 20/25 20/20   With correction:      Dentist: Pt has all his natural teeth and he is not followed by a dentist. [facility] [place] [facility].  Activity/Exercise: Pt walks daily for exercise.  Depression Screening: Depression screen [facility] 2/9 11/13/2016 01/10/2016 09/05/2015 07/12/2015 05/18/2015  Decreased Interest 0 0 0 0 0  Down, Depressed, Hopeless 0 0 0 0 0  PHQ - 2 Score 0 0 0 0 0   Advance Directive: Pt does not have a living will and plans on completing it with his wife soon. Pt agrees to get paperwork.  Fall Screening: Fall Risk  11/13/2016 01/10/2016 09/05/2015 04/02/2015 01/22/2015  Falls in the past year? No No No No No   Functional Status Survey: Hearing Difficulty: Reports intermittent hearing loss since he's been in the service. Last had it check 1.5 years ago at the [place]. They didn't recommend hearing aids and he hasn't had any changes in his hearing since. Is the patient deaf or have difficulty hearing?: Yes (pt states he has little difficulty with hearing in both ears) Does the patient have difficulty seeing, even when wearing glasses/contacts?: No Does the patient have difficulty concentrating, remembering, or making decisions?: No Does the patient  have difficulty walking or climbing stairs?: No Does the patient have difficulty dressing or bathing?: No Does the patient have difficulty doing errands alone such as visiting a doctor's office or shopping?: No  Patient Active Problem List   Diagnosis Date Noted  . Anemia in chronic kidney disease 11/30/2015  . Recurrent deep vein thrombosis (DVT) ([place]) 10/01/2015  . Hematuria, unspecified 10/01/2015  . Elevated serum creatinine 10/01/2015  . BPH (benign prostatic hyperplasia) 08/13/2015  . Benign localized hyperplasia of  prostate with urinary obstruction 07/23/2015  . Preop cardiovascular exam 07/17/2015  . Encounter for CDL (commercial driving license) exam 87/56/4332  . Obesity (BMI 30-39.9) 06/26/2013  . Essential hypertension 06/26/2013  . Panhypopituitarism ([place]) 11/17/2011  .   11/17/2011  . Diabetes mellitus ([place]) 11/17/2011  . Hyperlipidemia LDL goal <70 11/17/2011  . CAD S/P percutaneous coronary angioplasty 07/19/2002   Past Medical History:  Diagnosis Date  . CAD S/P percutaneous coronary angioplasty February 2004; 2008   PCI-dLAD: 2.25 mm x 16 mm Express BMS; Ramus- 2.75 mm x 18 mm Cypher DES (2.8 mm); follow-up 11/'08: Patent LAD/ramus stents. 90% small OM. EF 50-60%.; Myoview 1/'12: Exercise 9 min, 10 METS with no ischemia/infarction.   . Clotting disorder ([place])   . Diabetes mellitus    Low-dose metformin  . DJD (degenerative joint disease), lumbosacral     status post L4-L5 surgery  . Essential hypertension   . History of DVT of lower extremity     postoperative  . Hyperlipidemia with target LDL less than 70   . Hypothyroidism    Synthroid  . Panhypopituitarism ([place])    Status post pituitary adenoma removal in 2012  . Recurrent deep vein thrombosis (DVT) ([place]) 10/01/2015   Past Surgical History:  Procedure Laterality Date  . BRAIN SURGERY  2012   Removal of pituitary adenoma  . CARDIAC CATHETERIZATION  November 2008   Significant LAD tapering but no significant disease the distal stent. Patent Ramus stent. 90% lesion in small OM 2; small nondominant RCA. Medical therapy. EF 50-60%.  . CORONARY ANGIOPLASTY WITH STENT PLACEMENT  February 2004   PCI-dLAD: 2.25 mm x 16 mm BMS;;PCI- RI: 2.75 mm 18 mm Cypher DES  . NM MYOVIEW LTD  January 2012   Exercise ~9 min - 10 METS; no ischemia or infarction  . PROSTATECTOMY N/A 08/13/2015   Procedure: TRANSVESICLE OPEN PROSTATECTOMY** ;  Surgeon: Carolan Clines, MD;  Location: [facility];  Service: Urology;  Laterality: N/A;  . SPINE SURGERY       L4-L5   Allergies  Allergen Reactions  . Hydrochlorothiazide     Leg and side pain with this  . Other     Blood pressure med name unknown.   Prior to Admission medications   Medication Sig Start Date End Date Taking? Authorizing Provider  amLODipine (NORVASC) 10 MG tablet Take 1 tablet (10 mg total) by mouth daily. 07/24/16  Yes Leonie Man, MD  aspirin 81 MG tablet Take 81 mg by mouth daily. Reported on 09/05/2015   Yes [provider]  hydrocortisone (CORTEF) 20 MG tablet Take 10-20 mg by mouth 2 (two) times daily. Takes 1 tablet in the morning and 1/2 tablet at night   Yes [provider]  levothyroxine (SYNTHROID, LEVOTHROID) 88 MCG tablet TAKE 1 TABLET BY MOUTH EVERY DAY 09/03/16  Yes Wendie Agreste, MD  lisinopril (PRINIVIL,ZESTRIL) 20 MG tablet TAKE 1 TABLET (20 MG TOTAL) BY MOUTH DAILY. 07/24/16  Yes Leonie Man, MD  SitaGLIPtin-MetFORMIN HCl [phone] MG TB24 1 tab daily with dinner 09/24/16  Yes Elayne Snare, MD  metoprolol succinate (TOPROL-XL) 25 MG 24 hr tablet Take 1 tablet (25 mg total) by mouth daily. Patient not taking: Reported on 09/24/2016 07/24/16   Leonie Man, MD   Social History   Social History  . Marital status: Married    Spouse name: N/A  . Number of children: N/A  . Years of education: N/A   Occupational History  . Not on file.   Social History Main Topics  . Smoking status: Former Smoker    Quit date: 06/24/1993  . Smokeless tobacco: Never Used  . Alcohol use No  . Drug use: No  . 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 Not on file   Social History Narrative   He is a married father of 2, grandfather 26. Exercises only occasionally. Not as much as he desires 2. 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   He does not smoke cigarettes -- quit in 1995. Does not drink alcohol.   Review of Systems  Eyes: Positive for itching.  13 point ROS  positive for the above only. Objective:  Physical Exam  Constitutional: He is oriented to person, place, and time. He appears well-developed and well-nourished.  HENT:  Head: Normocephalic and atraumatic.  Right Ear: External ear normal.  Left Ear: External ear normal.  Mouth/Throat: Oropharynx is clear and moist.  Eyes: Conjunctivae and EOM are normal. Pupils are equal, round, and reactive to light.  Neck: Normal range of motion. Neck supple. No thyromegaly present.  Cardiovascular: Normal rate, regular rhythm, normal heart sounds and intact distal pulses.   Pulmonary/Chest: Effort normal and breath sounds normal. No respiratory distress. He has no wheezes.  Abdominal: Soft. He exhibits mass. He exhibits no distension. There is no tenderness. Hernia confirmed negative in the right inguinal area and confirmed negative in the left inguinal area.  Small firm area, left abdomen  Genitourinary: Prostate normal.  Musculoskeletal: Normal range of motion. He exhibits no edema or tenderness.  Lymphadenopathy:    He has no cervical adenopathy.  Neurological: He is alert and oriented to person, place, and time. He has normal reflexes.  Skin: Skin is warm and dry.  Psychiatric: He has a normal mood and affect. His behavior is normal.  Vitals reviewed.  Vitals:   11/13/16 1034 11/13/16 1047  BP: (!) 159/82 136/83  Pulse: (!) 56   Resp: 18   Temp: 97.4 F (36.3 C)   TempSrc: Oral   SpO2: 98%   Weight: 233 lb (105.7 kg)   Height: 5' 11.65" (1.82 m)    Body mass index is 31.91 kg/m.  Assessment & Plan:  Carl Gutierrez is a 71 y.o. male Medicare annual wellness visit, subsequent  -  - anticipatory guidance as below in AVS, screening labs if needed. Health maintenance items as above in HPI discussed/recommended as applicable.   - no concerning responses on depression, fall, or functional status screening. Any positive responses noted as above. Advanced directives discussed as in CHL.   -  [facility]  -[facility] with [facility], let me know if resources needed locally  -Some screening, immunizations refused as above.  Type 2 diabetes mellitus with hyperglycemia, without long-term current use of insulin ([place]) - Plan: [facility] Diabetes  Foot Exam  -Continue follow-up with endocrinologist.  No orders of the defined types were placed in this encounter.  Patient Instructions   [facility] to determine [facility] and schedule that if it is due.   If you change mind on prostate cancer testing, return to have that performed.  If you change mind on shingles vaccine, let me know and I can send that to your pharmacy.  Schedule appointment with dentist, and follow up with eye care provider within a year.  Can schedule repeat hearing test if needed through [place].  Let me know if you need a referral to audiology locally.   Artificial eyedrops such as Systane can be used if you are having some dry eye symptoms. Otherwise discuss the previous eyedrops with your eye care provider.  Keeping you healthy  Get these tests  Blood pressure- Have your blood pressure checked once a year by your healthcare provider.  Normal blood pressure is 120/80  Weight- Have your body mass index (BMI) calculated to screen for obesity.  BMI is a measure of body fat based on height and weight. You can also calculate your own BMI at ViewBanking.[email or web address].  Cholesterol- Have your cholesterol checked every year.  Diabetes- Have your blood sugar checked regularly if you have high blood pressure, high cholesterol, have a family history of diabetes or if you are overweight.  Screening for Colon Cancer- Colonoscopy starting at age 48.  Screening may begin sooner depending on your family history and other health conditions. Follow up colonoscopy as directed by your Gastroenterologist.  Screening for Prostate Cancer- Both blood work (PSA) and a rectal exam help screen for  Prostate Cancer.  Screening begins at age 43 with African-American men and at age 27 with Caucasian men.  Screening may begin sooner depending on your family history.  Take these medicines  Aspirin- One aspirin daily can help prevent Heart disease and Stroke.  Flu shot- Every fall.  Tetanus- Every 10 years.  Zostavax- Once after the age of 72 to prevent Shingles.  Pneumonia shot- Once after the age of 24; if you are younger than 42, ask your healthcare provider if you need a Pneumonia shot.  Take these steps  Don't smoke- If you do smoke, talk to your doctor about quitting.  For tips on how to quit, go to www.smokefree.gov or call 1-800-QUIT-NOW.  Be physically active- Exercise 5 days a week for at least 30 minutes.  If you are not already physically active start slow and gradually work up to 30 minutes of moderate physical activity.  Examples of moderate activity include walking briskly, mowing the yard, dancing, swimming, bicycling, etc.  Eat a healthy diet- Eat a variety of healthy food such as fruits, vegetables, low fat milk, low fat cheese, yogurt, lean meant, poultry, fish, beans, tofu, etc. For more information go to www.thenutritionsource.org  Drink alcohol in moderation- Limit alcohol intake to less than two drinks a day. Never drink and drive.  Dentist- Brush and floss twice daily; visit your dentist twice a year.  Depression- Your emotional health is as important as your physical health. If you're feeling down, or losing interest in things you would normally enjoy please talk to your healthcare provider.  Eye exam- Visit your eye doctor every year.  Safe sex- If you may be exposed to a sexually transmitted infection, use a condom.  Seat belts- Seat belts can save your life; always wear one.  Smoke/Carbon Monoxide detectors- These detectors need to be installed on  the appropriate level of your home.  Replace batteries at least once a year.  Skin cancer- When out in the  sun, cover up and use sunscreen 15 SPF or higher.  Violence- If anyone is threatening you, please tell your healthcare provider.  Living Will/ Health care power of attorney- Speak with your healthcare provider and family.    IF you received an x-ray today, you will receive an invoice from [facility]. Please contact [facility] at [phone] with questions or concerns regarding your invoice.   IF you received labwork today, you will receive an invoice from [facility]. Please contact [facility] at [phone] with questions or concerns regarding your invoice.   Our billing staff will not be able to assist you with questions regarding bills from these companies.  You will be contacted with the lab results as soon as they are available. The fastest way to get your results is to activate your My Chart account. Instructions are located on the last page of this paperwork. If you have not heard from [place] regarding the results in 2 weeks, please contact this office.       I personally performed the services described in this documentation, which was scribed in my presence. The recorded information has been reviewed and considered for accuracy and completeness, addended by me as needed, and agree with information above.  Signed,   [name], MD [facility].  11/15/16 11:50 AM

## 2016-11-13 NOTE — Patient Instructions (Addendum)
[facility] and schedule that if it is due.   If you change mind on prostate cancer testing, return to have that performed.  If you change mind on shingles vaccine, let me know and I can send that to your pharmacy.  Schedule appointment with dentist, and follow up with eye care provider within a year.  Can schedule repeat hearing test if needed through [place].  Let me know if you need a referral to audiology locally.   Artificial eyedrops such as Systane can be used if you are having some dry eye symptoms. Otherwise discuss the previous eyedrops with your eye care provider.  Keeping you healthy  Get these tests  Blood pressure- Have your blood pressure checked once a year by your healthcare provider.  Normal blood pressure is 120/80  Weight- Have your body mass index (BMI) calculated to screen for obesity.  BMI is a measure of body fat based on height and weight. You can also calculate your own BMI at ViewBanking.[email or web address].  Cholesterol- Have your cholesterol checked every year.  Diabetes- Have your blood sugar checked regularly if you have high blood pressure, high cholesterol, have a family history of diabetes or if you are overweight.  Screening for Colon Cancer- Colonoscopy starting at age 33.  Screening may begin sooner depending on your family history and other health conditions. Follow up colonoscopy as directed by your Gastroenterologist.  Screening for Prostate Cancer- Both blood work (PSA) and a rectal exam help screen for Prostate Cancer.  Screening begins at age 15 with African-American men and at age 41 with Caucasian men.  Screening may begin sooner depending on your family history.  Take these medicines  Aspirin- One aspirin daily can help prevent Heart disease and Stroke.  Flu shot- Every fall.  Tetanus- Every 10 years.  Zostavax- Once after the age of 89 to prevent Shingles.  Pneumonia shot- Once after the age of 75; if you are younger than  50, ask your healthcare provider if you need a Pneumonia shot.  Take these steps  Don't smoke- If you do smoke, talk to your doctor about quitting.  For tips on how to quit, go to www.smokefree.gov or call 1-800-QUIT-NOW.  Be physically active- Exercise 5 days a week for at least 30 minutes.  If you are not already physically active start slow and gradually work up to 30 minutes of moderate physical activity.  Examples of moderate activity include walking briskly, mowing the yard, dancing, swimming, bicycling, etc.  Eat a healthy diet- Eat a variety of healthy food such as fruits, vegetables, low fat milk, low fat cheese, yogurt, lean meant, poultry, fish, beans, tofu, etc. For more information go to www.thenutritionsource.org  Drink alcohol in moderation- Limit alcohol intake to less than two drinks a day. Never drink and drive.  Dentist- Brush and floss twice daily; visit your dentist twice a year.  Depression- Your emotional health is as important as your physical health. If you're feeling down, or losing interest in things you would normally enjoy please talk to your healthcare provider.  Eye exam- Visit your eye doctor every year.  Safe sex- If you may be exposed to a sexually transmitted infection, use a condom.  Seat belts- Seat belts can save your life; always wear one.  Smoke/Carbon Monoxide detectors- These detectors need to be installed on the appropriate level of your home.  Replace batteries at least once a year.  Skin cancer- When out in the sun, cover up  and use sunscreen 15 SPF or higher.  Violence- If anyone is threatening you, please tell your healthcare provider.  Living Will/ Health care power of attorney- Speak with your healthcare provider and family.    IF you received an x-ray today, you will receive an invoice from [facility]. Please contact [facility] at [phone] with questions or concerns regarding your invoice.   IF you received  labwork today, you will receive an invoice from [place]. Please contact [facility] at [phone] with questions or concerns regarding your invoice.   Our billing staff will not be able to assist you with questions regarding bills from these companies.  You will be contacted with the lab results as soon as they are available. The fastest way to get your results is to activate your My Chart account. Instructions are located on the last page of this paperwork. If you have not heard from [place] regarding the results in 2 weeks, please contact this office.

## 2016-11-18 DIAGNOSIS — N401 Enlarged prostate with lower urinary tract symptoms: Secondary | ICD-10-CM | POA: Diagnosis not present

## 2016-11-18 DIAGNOSIS — R351 Nocturia: Secondary | ICD-10-CM | POA: Diagnosis not present

## 2016-12-20 ENCOUNTER — Other Ambulatory Visit: Payer: Self-pay | Admitting: Endocrinology

## 2016-12-20 ENCOUNTER — Other Ambulatory Visit: Payer: Self-pay | Admitting: Family Medicine

## 2016-12-26 ENCOUNTER — Other Ambulatory Visit (INDEPENDENT_AMBULATORY_CARE_PROVIDER_SITE_OTHER): Payer: Medicare Other

## 2016-12-26 DIAGNOSIS — E230 Hypopituitarism: Secondary | ICD-10-CM | POA: Diagnosis not present

## 2016-12-26 DIAGNOSIS — E119 Type 2 diabetes mellitus without complications: Secondary | ICD-10-CM

## 2016-12-26 DIAGNOSIS — E1165 Type 2 diabetes mellitus with hyperglycemia: Secondary | ICD-10-CM | POA: Diagnosis not present

## 2016-12-26 LAB — URINALYSIS, ROUTINE W REFLEX MICROSCOPIC
Bilirubin Urine: NEGATIVE
Hgb urine dipstick: NEGATIVE
Ketones, ur: NEGATIVE
Leukocytes, UA: NEGATIVE
Nitrite: NEGATIVE
RBC / HPF: NONE SEEN (ref 0–?)
Specific Gravity, Urine: 1.02 (ref 1.000–1.030)
Total Protein, Urine: NEGATIVE
Urine Glucose: NEGATIVE
Urobilinogen, UA: 0.2 (ref 0.0–1.0)
WBC, UA: NONE SEEN (ref 0–?)
pH: 5.5 (ref 5.0–8.0)

## 2016-12-26 LAB — COMPREHENSIVE METABOLIC PANEL
ALT: 14 U/L (ref 0–53)
AST: 12 U/L (ref 0–37)
Albumin: 4 g/dL (ref 3.5–5.2)
Alkaline Phosphatase: 56 U/L (ref 39–117)
BUN: 21 mg/dL (ref 6–23)
CO2: 22 mEq/L (ref 19–32)
Calcium: 9.1 mg/dL (ref 8.4–10.5)
Chloride: 109 mEq/L (ref 96–112)
Creatinine, Ser: 1.25 mg/dL (ref 0.40–1.50)
GFR: 72.8 mL/min (ref >60.00)
Glucose, Bld: 104 mg/dL — ABNORMAL HIGH (ref 70–99)
Potassium: 4 mEq/L (ref 3.5–5.1)
Sodium: 141 mEq/L (ref 135–145)
Total Bilirubin: 0.3 mg/dL (ref 0.2–1.2)
Total Protein: 7 g/dL (ref 6.0–8.3)

## 2016-12-26 LAB — LIPID PANEL
Cholesterol: 122 mg/dL (ref 0–200)
HDL: 38.3 mg/dL — ABNORMAL LOW (ref >39.00)
LDL Cholesterol: 65 mg/dL (ref 0–99)
NonHDL: 83.74
Total CHOL/HDL Ratio: 3
Triglycerides: 95 mg/dL (ref 0.0–149.0)
VLDL: 19 mg/dL (ref 0.0–40.0)

## 2016-12-26 LAB — T4, FREE: Free T4: 1.05 ng/dL (ref 0.60–1.60)

## 2016-12-26 LAB — HEMOGLOBIN A1C: Hgb A1c MFr Bld: 6.3 % (ref 4.6–6.5)

## 2016-12-27 ENCOUNTER — Ambulatory Visit (INDEPENDENT_AMBULATORY_CARE_PROVIDER_SITE_OTHER): Payer: Medicare Other | Admitting: Family Medicine

## 2016-12-27 ENCOUNTER — Encounter: Payer: Self-pay | Admitting: Family Medicine

## 2016-12-27 VITALS — BP 154/76 | HR 60 | Temp 98.7°F | Resp 16 | Ht 71.0 in | Wt 233.6 lb

## 2016-12-27 DIAGNOSIS — S80821A Blister (nonthermal), right lower leg, initial encounter: Secondary | ICD-10-CM | POA: Diagnosis not present

## 2016-12-27 DIAGNOSIS — Z9861 Coronary angioplasty status: Secondary | ICD-10-CM

## 2016-12-27 DIAGNOSIS — I2510 Atherosclerotic heart disease of native coronary artery without angina pectoris: Secondary | ICD-10-CM | POA: Diagnosis not present

## 2016-12-27 NOTE — Progress Notes (Signed)
By signing my name below, I, Carl Gutierrez, attest that this documentation has been prepared under the direction and in the presence of Carl Ray, MD.  Electronically Signed: Verlee Gutierrez, Medical Scribe. 12/27/16. 3:41 PM.  Subjective:    Patient ID: Carl Gutierrez, male    DOB: [birth year], 71 y.o.   MRN: [id number]  HPI Chief Complaint  Patient presents with  . Joint Swelling    right ankle, boil, 1 week ago, itching     HPI Comments: Carl Gutierrez is a 72 y.o. male who presents to Primary Care at [facility] complaining of growing blister located on right medial ankle onset 6 days. History of DM but Most recent A1c was controlled.   Reports associated sxs of itchiness. Blister started out as a little bump and pt suspects it was a bug bite. Tuesday (4 days ago) it was the size of a quarter, and it's been growing ever since. Pt put alcohol on the concerned area without relief of his sxs. Pt had a cookout in the yard last Saturday. Pt has never had a blister like today and would like to drain it here today. Denies feeing sick, rash (genital/mouth), or calf pain. No pain, just itching. No other rash.  Patient Active Problem List   Diagnosis Date Noted  . Anemia in chronic kidney disease 11/30/2015  . Recurrent deep vein thrombosis (DVT) ([place]) 10/01/2015  . Hematuria, unspecified 10/01/2015  . Elevated serum creatinine 10/01/2015  . BPH (benign prostatic hyperplasia) 08/13/2015  . Benign localized hyperplasia of prostate with urinary obstruction 07/23/2015  . Preop cardiovascular exam 07/17/2015  . Encounter for CDL (commercial driving license) exam 31/54/0086  . Obesity (BMI 30-39.9) 06/26/2013  . Essential hypertension 06/26/2013  . Panhypopituitarism ([place]) 11/17/2011  .   11/17/2011  . Diabetes mellitus ([place]) 11/17/2011  . Hyperlipidemia LDL goal <70 11/17/2011  . CAD S/P percutaneous coronary angioplasty 07/19/2002   Past Medical History:  Diagnosis Date  . CAD S/P  percutaneous coronary angioplasty February 2004; 2008   PCI-dLAD: 2.25 mm x 16 mm Express BMS; Ramus- 2.75 mm x 18 mm Cypher DES (2.8 mm); follow-up 11/'08: Patent LAD/ramus stents. 90% small OM. EF 50-60%.; Myoview 1/'12: Exercise 9 min, 10 METS with no ischemia/infarction.   . Clotting disorder ([place])   . Diabetes mellitus    Low-dose metformin  . DJD (degenerative joint disease), lumbosacral     status post L4-L5 surgery  . Essential hypertension   . History of DVT of lower extremity     postoperative  . Hyperlipidemia with target LDL less than 70   . Hypothyroidism    Synthroid  . Panhypopituitarism ([place])    Status post pituitary adenoma removal in 2012  . Recurrent deep vein thrombosis (DVT) ([place]) 10/01/2015   Past Surgical History:  Procedure Laterality Date  . BRAIN SURGERY  2012   Removal of pituitary adenoma  . CARDIAC CATHETERIZATION  November 2008   Significant LAD tapering but no significant disease the distal stent. Patent Ramus stent. 90% lesion in small OM 2; small nondominant RCA. Medical therapy. EF 50-60%.  . CORONARY ANGIOPLASTY WITH STENT PLACEMENT  February 2004   PCI-dLAD: 2.25 mm x 16 mm BMS;;PCI- RI: 2.75 mm 18 mm Cypher DES  . NM MYOVIEW LTD  January 2012   Exercise ~9 min - 10 METS; no ischemia or infarction  . PROSTATECTOMY N/A 08/13/2015   Procedure: TRANSVESICLE OPEN PROSTATECTOMY** ;  Surgeon: Carl Clines, MD;  Location: [facility] ORS;  Service: Urology;  Laterality: N/A;  . SPINE SURGERY     L4-L5   Allergies  Allergen Reactions  . Hydrochlorothiazide     Leg and side pain with this  . Other     Blood pressure med name unknown.   Prior to Admission medications   Medication Sig Start Date End Date Taking? Authorizing Provider  amLODipine (NORVASC) 10 MG tablet Take 1 tablet (10 mg total) by mouth daily. 07/24/16  Yes Carl Man, MD  aspirin 81 MG tablet Take 81 mg by mouth daily. Reported on 09/05/2015   Yes [provider]    hydrocortisone (CORTEF) 20 MG tablet Take 10-20 mg by mouth 2 (two) times daily. Takes 1 tablet in the morning and 1/2 tablet at night   Yes [provider]  JANUMET XR [phone] MG TB24 TAKE 1 TAB DAILY WITH DINNER 12/21/16  Yes Carl Snare, MD  levothyroxine (SYNTHROID, LEVOTHROID) 88 MCG tablet TAKE 1 TABLET BY MOUTH EVERY DAY 09/03/16  Yes Carl Agreste, MD  lisinopril (PRINIVIL,ZESTRIL) 20 MG tablet TAKE 1 TABLET (20 MG TOTAL) BY MOUTH DAILY. 07/24/16  Yes Carl Man, MD  metoprolol succinate (TOPROL-XL) 25 MG 24 hr tablet Take 1 tablet (25 mg total) by mouth daily. 07/24/16  Yes Carl Man, MD   Social History   Social History  . Marital status: Married    Spouse name: N/A  . Number of children: N/A  . Years of education: N/A   Occupational History  . Not on file.   Social History Main Topics  . Smoking status: Former Smoker    Quit date: 06/24/1993  . Smokeless tobacco: Never Used  . Alcohol use No  . Drug use: No  . 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 Not on file   Social History Narrative   He is a married father of 2, grandfather 22. Exercises only occasionally. Not as much as he desires 2. 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   He does not smoke cigarettes -- quit in 1995. Does not drink alcohol.   Review of Systems  Constitutional: Negative for fatigue and fever.  HENT: Negative for mouth sores.   Genitourinary: Negative for genital sores.  Musculoskeletal: Negative for myalgias.  Skin: Negative for rash.       Boil   Objective:  Physical Exam  Constitutional: He appears well-developed and well-nourished. No distress.  HENT:  Head: Normocephalic and atraumatic.  Eyes: Conjunctivae are normal.  Neck: Neck supple.  Cardiovascular: Normal rate.   Pulmonary/Chest: Effort normal.  Neurological: He is alert.  Skin: Skin is warm and dry.  Bullae measuring  4.5 x 3 cm, on his right medial lower leg without surround erythema or rash Appears to have clear fluid centrally  Psychiatric: He has a normal mood and affect. His behavior is normal.  Nursing note and vitals reviewed.   Vitals:   12/27/16 1450  BP: (!) 154/76  Pulse: 60  Resp: 16  Temp: 98.7 F (37.1 C)  TempSrc: Oral  SpO2: 98%  Weight: 233 lb 9.6 oz (106 kg)  Height: 5\' 11"  (1.803 m)  Body mass index is 32.58 kg/m. Assessment & Plan:   Estevan Kersh is a 71 y.o. male Blister of right lower leg, initial encounter Possible insect/spider bite with secondary bulla. No surrounding signs of infection, nonpainful, just itching.  - Decided  to leave blister intact for protection of the underlying skin and lessen chance of secondary infection. Wound care precautions discussed if area does open, RTC precautions if worsening   No orders of the defined types were placed in this encounter.  Patient Instructions   Based on your symptoms I suspect an insect or spider did bite you in that area on your leg which then caused a blister. As a blister is not painful right now, it would be best to leave it intact and much her body heal that area on its own. If it does rupture or open, clean area with soap and water at least once a day and keep it covered with a bandage. Polysporin antibiotic over open area can also be used at that time. Watch for any redness or pus discharge from area - return here or other medical provider if that does occur.   Blisters, Adult A blister is a raised bubble of skin filled with liquid. Blisters often develop in an area of the skin that repeatedly rubs or presses against another surface (friction blister). Friction blisters can occur on any part of the body, but they usually develop on the hands or feet. Long-term pressure on the same area of the skin can also lead to areas of hardened skin (calluses). What are the causes? A blister can be caused by:  An injury.  A  burn.  An allergic reaction.  An infection.  Exposure to irritating chemicals.  Friction, especially in an area with a lot of heat and moisture.  Friction blisters often result from:  Sports.  Repetitive activities.  Using tools and doing other activities without wearing gloves.  Shoes that are too tight or too loose.  What are the signs or symptoms? A blister is often round and looks like a bump. It may:  Itch.  Be painful to the touch.  Before a blister forms, the skin may:  Become red.  Feel warm.  Itch.  Be painful to the touch.  How is this diagnosed? A blister is diagnosed with a physical exam. How is this treated? Treatment usually involves protecting the area where the blister has formed until the skin has healed. Other treatments may include:  A bandage (dressing) to cover the blister.  Extra padding around and over the blister, so that it does not rub on anything.  Antibiotic ointment.  Most blisters break open, dry up, and go away on their own within 1-2 weeks. Blisters that are very painful may be drained before they break open on their own. If the blister is large or painful, it can be drained by: 1. Sterilizing a small needle with rubbing alcohol. 2. Washing your hands with soap and water. 3. Inserting the needle in the edge of the blister to make a small hole. Some fluid will drain out of the hole. Let the top or roof of the blister stay in place. This helps the skin heal. 4. Washing the blister with mild soap and water. 5. Covering the blister with antibiotic ointment, if prescribed by your health care provider, and a dressing.  Some blisters may need to be drained by a health care provider. Follow these instructions at home:  Protect the area where the blister has formed as told by your health care provider.  Keep your blister clean and dry. This helps to prevent infection.  Do not pop your blister. This can cause infection.  If you  were prescribed an antibiotic, use it as told  by your health care provider. Do not stop using the antibiotic even if your condition improves.  Wear different shoes until the blister heals.  Avoid the activity that caused the blister until your blister heals.  Check your blister every day for signs of infection. Check for: ? More redness, swelling, or pain. ? More fluid or blood. ? Warmth. ? Pus or a bad smell. ? The blister getting better and then getting worse. How is this prevented? Taking these steps can help to prevent blisters that are caused by friction. Make sure you:  Wear comfortable shoes that fit well.  Always wear socks with shoes.  Wear extra socks or use tape, bandages, or pads over blister-prone areas as needed. You may also apply petroleum jelly under bandages in blister-prone areas.  Wear protective gear, such as gloves, when participating in sports or activities that can cause blisters.  Wear loose-fitting, moisture-wicking clothes when participating in sports or activities.  Use powders as needed to keep your feet dry.  Contact a health care provider if:  You have more redness, swelling, or pain around your blister.  You have more fluid or blood coming from your blister.  Your blister feels warm to the touch.  You have pus or a bad smell coming from your blister.  You have a fever or chills.  Your blister gets better and then it gets worse. This information is not intended to replace advice given to you by your health care provider. Make sure you discuss any questions you have with your health care provider. Document Released: 07/10/2004 Document Revised: 01/30/2016 Document Reviewed: 12/14/2015 Elsevier Interactive Patient Education  2018 [employer].    IF you received an x-Gutierrez today, you will receive an invoice from [facility]. Please contact [facility] at [phone] with questions or concerns regarding your invoice.   IF  you received labwork today, you will receive an invoice from [place]. Please contact [facility] at [phone] with questions or concerns regarding your invoice.   Our billing staff will not be able to assist you with questions regarding bills from these companies.  You will be contacted with the lab results as soon as they are available. The fastest way to get your results is to activate your My Chart account. Instructions are located on the last page of this paperwork. If you have not heard from [place] regarding the results in 2 weeks, please contact this office.       I personally performed the services described in this documentation, which was scribed in my presence. The recorded information has been reviewed and considered for accuracy and completeness, addended by me as needed, and agree with information above.  Signed,   Carl Ray, MD Primary Care at [facility].  12/27/16 4:02 PM

## 2016-12-27 NOTE — Patient Instructions (Addendum)
Based on your symptoms I suspect an insect or spider did bite you in that area on your leg which then caused a blister. As a blister is not painful right now, it would be best to leave it intact and much her body heal that area on its own. If it does rupture or open, clean area with soap and water at least once a day and keep it covered with a bandage. Polysporin antibiotic over open area can also be used at that time. Watch for any redness or pus discharge from area - return here or other medical provider if that does occur.   Blisters, Adult A blister is a raised bubble of skin filled with liquid. Blisters often develop in an area of the skin that repeatedly rubs or presses against another surface (friction blister). Friction blisters can occur on any part of the body, but they usually develop on the hands or feet. Long-term pressure on the same area of the skin can also lead to areas of hardened skin (calluses). What are the causes? A blister can be caused by:  An injury.  A burn.  An allergic reaction.  An infection.  Exposure to irritating chemicals.  Friction, especially in an area with a lot of heat and moisture.  Friction blisters often result from:  Sports.  Repetitive activities.  Using tools and doing other activities without wearing gloves.  Shoes that are too tight or too loose.  What are the signs or symptoms? A blister is often round and looks like a bump. It may:  Itch.  Be painful to the touch.  Before a blister forms, the skin may:  Become red.  Feel warm.  Itch.  Be painful to the touch.  How is this diagnosed? A blister is diagnosed with a physical exam. How is this treated? Treatment usually involves protecting the area where the blister has formed until the skin has healed. Other treatments may include:  A bandage (dressing) to cover the blister.  Extra padding around and over the blister, so that it does not rub on anything.  Antibiotic  ointment.  Most blisters break open, dry up, and go away on their own within 1-2 weeks. Blisters that are very painful may be drained before they break open on their own. If the blister is large or painful, it can be drained by: 1. Sterilizing a small needle with rubbing alcohol. 2. Washing your hands with soap and water. 3. Inserting the needle in the edge of the blister to make a small hole. Some fluid will drain out of the hole. Let the top or roof of the blister stay in place. This helps the skin heal. 4. Washing the blister with mild soap and water. 5. Covering the blister with antibiotic ointment, if prescribed by your health care provider, and a dressing.  Some blisters may need to be drained by a health care provider. Follow these instructions at home:  Protect the area where the blister has formed as told by your health care provider.  Keep your blister clean and dry. This helps to prevent infection.  Do not pop your blister. This can cause infection.  If you were prescribed an antibiotic, use it as told by your health care provider. Do not stop using the antibiotic even if your condition improves.  Wear different shoes until the blister heals.  Avoid the activity that caused the blister until your blister heals.  Check your blister every day for signs of infection. Check  for: ? More redness, swelling, or pain. ? More fluid or blood. ? Warmth. ? Pus or a bad smell. ? The blister getting better and then getting worse. How is this prevented? Taking these steps can help to prevent blisters that are caused by friction. Make sure you:  Wear comfortable shoes that fit well.  Always wear socks with shoes.  Wear extra socks or use tape, bandages, or pads over blister-prone areas as needed. You may also apply petroleum jelly under bandages in blister-prone areas.  Wear protective gear, such as gloves, when participating in sports or activities that can cause blisters.  Wear  loose-fitting, moisture-wicking clothes when participating in sports or activities.  Use powders as needed to keep your feet dry.  Contact a health care provider if:  You have more redness, swelling, or pain around your blister.  You have more fluid or blood coming from your blister.  Your blister feels warm to the touch.  You have pus or a bad smell coming from your blister.  You have a fever or chills.  Your blister gets better and then it gets worse. This information is not intended to replace advice given to you by your health care provider. Make sure you discuss any questions you have with your health care provider. Document Released: 07/10/2004 Document Revised: 01/30/2016 Document Reviewed: 12/14/2015 Elsevier Interactive Patient Education  2018 [employer].    IF you received an x-ray today, you will receive an invoice from [facility]. Please contact [facility] at [phone] with questions or concerns regarding your invoice.   IF you received labwork today, you will receive an invoice from [place]. Please contact [facility] at [phone] with questions or concerns regarding your invoice.   Our billing staff will not be able to assist you with questions regarding bills from these companies.  You will be contacted with the lab results as soon as they are available. The fastest way to get your results is to activate your My Chart account. Instructions are located on the last page of this paperwork. If you have not heard from [place] regarding the results in 2 weeks, please contact this office.

## 2016-12-29 ENCOUNTER — Telehealth: Payer: Self-pay | Admitting: Family Medicine

## 2016-12-29 NOTE — Telephone Encounter (Signed)
Pts wife is calling to check on the status of pt's Norvasc prescription.  [facility] had sent [facility] few [facility]ill [facility] and have not heard anything.  Pt is currently out of medication.  [facility].  Please advise [phone]

## 2016-12-30 ENCOUNTER — Other Ambulatory Visit: Payer: Self-pay | Admitting: Family Medicine

## 2016-12-30 NOTE — Telephone Encounter (Signed)
30 day rx sent to pharmacy.

## 2016-12-31 ENCOUNTER — Encounter: Payer: Self-pay | Admitting: Endocrinology

## 2016-12-31 ENCOUNTER — Ambulatory Visit (INDEPENDENT_AMBULATORY_CARE_PROVIDER_SITE_OTHER): Payer: Medicare Other | Admitting: Endocrinology

## 2016-12-31 VITALS — BP 142/82 | HR 65 | Ht 71.0 in | Wt 235.2 lb

## 2016-12-31 DIAGNOSIS — I2510 Atherosclerotic heart disease of native coronary artery without angina pectoris: Secondary | ICD-10-CM | POA: Diagnosis not present

## 2016-12-31 DIAGNOSIS — E119 Type 2 diabetes mellitus without complications: Secondary | ICD-10-CM | POA: Diagnosis not present

## 2016-12-31 DIAGNOSIS — E782 Mixed hyperlipidemia: Secondary | ICD-10-CM | POA: Diagnosis not present

## 2016-12-31 DIAGNOSIS — Z9861 Coronary angioplasty status: Secondary | ICD-10-CM

## 2016-12-31 DIAGNOSIS — E230 Hypopituitarism: Secondary | ICD-10-CM

## 2016-12-31 DIAGNOSIS — I10 Essential (primary) hypertension: Secondary | ICD-10-CM | POA: Diagnosis not present

## 2016-12-31 NOTE — Progress Notes (Signed)
Carl Gutierrez is a 71 y.o. male.            Chief complaint: Followup   History of Present Illness:  HYPOPITUITARISM: This is secondary to his pituitary tumor which was treated surgically in 07/2010 He has usually been asymptomatic with his multiple hormone deficiencies except at the time of diagnosis.  History of hypogonadism at baseline levels of about 18.  Initially had decreased libido, some fatigue and also erectile dysfunction His previous testosterone levels have ranged from 64-252 and did not get adequate levels with AndroGel because of poor compliance.  Marland Kitchen  He was switched to testosterone injections and had variable compliance with getting his injections His testosterone level previously was over 400 with injections but on his last visit the level was relatively low about 2 weeks after his 300 mg dosage Records show that his last injection was in January 2017  Subsequently the patient had refused to take any treatment and does not think   Again he says that he feels fairly good with his energy level and not any worse with stopping his testosterone injections Testosterone level has been subsequently consistently low  Lab Results  Component Value Date   TESTOSTERONE 61.77 (L) 09/19/2016    HYPOTHYROIDISM, secondary:  He has secondary hypothyroidism and is  taking 88 mcg Levothyroxine supplement daily.   His dose was reduced in 9/15 because of relatively higher free T4 level  Subsequently has had consistent dosage  No fatigue recently He has lost weight with improving diet Free T4 level is consistently normal   Lab Results  Component Value Date   FREET4 1.05 12/26/2016   FREET4 1.10 09/19/2016   FREET4 1.16 05/23/2016   FREET4 1.12 02/16/2015    ADRENAL insufficiency:  He has been continued on steroid replacement with hydrocortisone 20 mg in the morning and 10 mg the evening He is compliant with this daily No lightheadedness, weak feeling or nausea Electrolytes  normal  Has not had growth hormone deficiency when previously tested   DIABETES: See review of systems   Allergies as of 12/31/2016      Reactions   Hydrochlorothiazide    Leg and side pain with this   Other    Blood pressure med name unknown.      Medication List       Accurate as of 12/31/16  3:56 PM. Always use your most recent med list.          amLODipine 10 MG tablet Commonly known as:  NORVASC Take 1 tablet (10 mg total) by mouth daily.   aspirin 81 MG tablet Take 81 mg by mouth daily. Reported on 09/05/2015   hydrocortisone 20 MG tablet Commonly known as:  CORTEF Take 10-20 mg by mouth 2 (two) times daily. Takes 1 tablet in the morning and 1/2 tablet at night   JANUMET XR [phone] MG Tb24 Generic drug:  SitaGLIPtin-MetFORMIN HCl TAKE 1 TAB DAILY WITH DINNER   levothyroxine 88 MCG tablet Commonly known as:  SYNTHROID, LEVOTHROID TAKE 1 TABLET BY MOUTH EVERY DAY   lisinopril 20 MG tablet Commonly known as:  PRINIVIL,ZESTRIL TAKE 1 TABLET (20 MG TOTAL) BY MOUTH DAILY.   metoprolol succinate 25 MG 24 hr tablet Commonly known as:  TOPROL-XL Take 1 tablet (25 mg total) by mouth daily.       Allergies:  Allergies  Allergen Reactions  . Hydrochlorothiazide     Leg and side pain with this  . Other     Blood  pressure med name unknown.    Past Medical History:  Diagnosis Date  . CAD S/P percutaneous coronary angioplasty February 2004; 2008   PCI-dLAD: 2.25 mm x 16 mm Express BMS; Ramus- 2.75 mm x 18 mm Cypher DES (2.8 mm); follow-up 11/'08: Patent LAD/ramus stents. 90% small OM. EF 50-60%.; Myoview 1/'12: Exercise 9 min, 10 METS with no ischemia/infarction.   . Clotting disorder ([place])   . Diabetes mellitus    Low-dose metformin  . DJD (degenerative joint disease), lumbosacral     status post L4-L5 surgery  . Essential hypertension   . History of DVT of lower extremity     postoperative  . Hyperlipidemia with target LDL less than 70   .  Hypothyroidism    Synthroid  . Panhypopituitarism ([place])    Status post pituitary adenoma removal in 2012  . Recurrent deep vein thrombosis (DVT) ([place]) 10/01/2015    Past Surgical History:  Procedure Laterality Date  . BRAIN SURGERY  2012   Removal of pituitary adenoma  . CARDIAC CATHETERIZATION  November 2008   Significant LAD tapering but no significant disease the distal stent. Patent Ramus stent. 90% lesion in small OM 2; small nondominant RCA. Medical therapy. EF 50-60%.  . CORONARY ANGIOPLASTY WITH STENT PLACEMENT  February 2004   PCI-dLAD: 2.25 mm x 16 mm BMS;;PCI- RI: 2.75 mm 18 mm Cypher DES  . NM MYOVIEW LTD  January 2012   Exercise ~9 min - 10 METS; no ischemia or infarction  . PROSTATECTOMY N/A 08/13/2015   Procedure: TRANSVESICLE OPEN PROSTATECTOMY** ;  Surgeon: Carolan Clines, MD;  Location: [facility];  Service: Urology;  Laterality: N/A;  . SPINE SURGERY     L4-L5    Family History  Problem Relation Age of Onset  . Cancer Mother        pancreatic ca    Social History:  reports that he quit smoking about 23 years ago. He has never used smokeless tobacco. He reports that he does not drink alcohol or use drugs.  Review of Systems    DIABETES Type II:  He has had mild diabetes for the last few years and previously treated by his primary care physician with metformin only  When his A1c was up to 7.4 in 9/15 he was told to increase his metformin to 3 tablets a day  He was seen by the dietitian in 6/14 and was instructed on cut back on high fat foods, to eat balanced breakfast   He does not want to check his blood sugars at home  His A1c had increased to 6.6 in April Since he likely had post prandial hyperglycemia was changed from metformin to Janumet XR He also has started making better choices with eating, he was given information on healthy choices especially with eating out previously has gradually Increased and now it is 6.6  His weight has come down A1c is  also improved to 6.3  Walking a little only, is still not motivated, he says that he is having some knee pain   Wt Readings from Last 3 Encounters:  12/31/16 235 lb 3.2 oz (106.7 kg)  12/27/16 233 lb 9.6 oz (106 kg)  11/13/16 233 lb (105.7 kg)    Lab Results  Component Value Date   HGBA1C 6.3 12/26/2016   HGBA1C 6.6 (H) 09/19/2016   HGBA1C 6.4 05/23/2016   Lab Results  Component Value Date   MICROALBUR 1.2 05/23/2016   LDLCALC 65 12/26/2016   CREATININE 1.25 12/26/2016  HYPERCHOLESTEROLEMIA: Treated with simvastatin and followed by PCP LDL below 100  Lab Results  Component Value Date   CHOL 122 12/26/2016   HDL 38.30 (L) 12/26/2016   LDLCALC 65 12/26/2016   LDLDIRECT 85.0 05/18/2014   TRIG 95.0 12/26/2016   CHOLHDL 3 12/26/2016    HYPERTENSION: Treated with amlodipine and lisinopril, Also followed by PCP  He is asking about a blister on the right leg, this was seen by PCP and had to be a ?  spider bite  EXAM:  BP (!) 142/82   Pulse 65   Ht 5\' 11"  (1.803 m)   Wt 235 lb 3.2 oz (106.7 kg)   SpO2 96%   BMI 32.80 kg/m   He has a very large blister on the right lower leg medially which is relatively soft, does not appear inflamed with some reddish serum at the base  Assessment/Plan:   PANHYPOPITUITARISM: He has Persistent hormone defects.     Hypogonadism: He has  been asymptomatic even with  very low testosterone levels.  No complaints of tiredness or decrease muscle strength Still does not complain of fatigue, is again not interested in treatment  Secondary hypothyroidism: His free T4 is stable and he will continue 88 g  Secondary adrenal insufficiency.  Subjectively doing well with hydrocortisone supplementation and will continue  DIABETES:  He has done well with improving diet and switching from metformin to Janumet XR Glucose 104 A1c better at 6.3 He again refuses to check blood sugar at home He will continue same regimen, encouraged him to be  as active as possible  Leg BLISTER: He is going to follow-up with PCP.  Advised him not to take any aspirin at this time in case he has any underlying bleeding or has bleeding tendency with intervention   Total visit time for evaluation and management of various problems, counseling and review of labs and medications = 25 minutes  Earnesteen Birnie 12/31/2016, 3:56 PM

## 2017-01-01 DIAGNOSIS — S80861A Insect bite (nonvenomous), right lower leg, initial encounter: Secondary | ICD-10-CM | POA: Diagnosis not present

## 2017-02-16 DIAGNOSIS — M109 Gout, unspecified: Secondary | ICD-10-CM | POA: Diagnosis not present

## 2017-02-20 DIAGNOSIS — M79672 Pain in left foot: Secondary | ICD-10-CM | POA: Diagnosis not present

## 2017-03-03 ENCOUNTER — Other Ambulatory Visit: Payer: Self-pay | Admitting: Family Medicine

## 2017-03-03 DIAGNOSIS — E039 Hypothyroidism, unspecified: Secondary | ICD-10-CM

## 2017-03-14 ENCOUNTER — Other Ambulatory Visit: Payer: Self-pay | Admitting: Endocrinology

## 2017-04-02 DIAGNOSIS — Z23 Encounter for immunization: Secondary | ICD-10-CM | POA: Diagnosis not present

## 2017-06-07 NOTE — Progress Notes (Signed)
Injection visit  Carl Gutierrez

## 2017-06-24 ENCOUNTER — Other Ambulatory Visit: Payer: Self-pay | Admitting: Endocrinology

## 2017-06-25 ENCOUNTER — Other Ambulatory Visit (INDEPENDENT_AMBULATORY_CARE_PROVIDER_SITE_OTHER): Payer: Medicare Other

## 2017-06-25 DIAGNOSIS — E119 Type 2 diabetes mellitus without complications: Secondary | ICD-10-CM

## 2017-06-25 DIAGNOSIS — E230 Hypopituitarism: Secondary | ICD-10-CM | POA: Diagnosis not present

## 2017-06-25 DIAGNOSIS — E782 Mixed hyperlipidemia: Secondary | ICD-10-CM

## 2017-06-25 LAB — COMPREHENSIVE METABOLIC PANEL
ALT: 15 U/L (ref 0–53)
AST: 12 U/L (ref 0–37)
Albumin: 4.1 g/dL (ref 3.5–5.2)
Alkaline Phosphatase: 65 U/L (ref 39–117)
BUN: 21 mg/dL (ref 6–23)
CO2: 24 mEq/L (ref 19–32)
Calcium: 9 mg/dL (ref 8.4–10.5)
Chloride: 107 mEq/L (ref 96–112)
Creatinine, Ser: 1.27 mg/dL (ref 0.40–1.50)
GFR: 71.38 mL/min (ref >60.00)
Glucose, Bld: 141 mg/dL — ABNORMAL HIGH (ref 70–99)
Potassium: 4.4 mEq/L (ref 3.5–5.1)
Sodium: 140 mEq/L (ref 135–145)
Total Bilirubin: 0.3 mg/dL (ref 0.2–1.2)
Total Protein: 7.2 g/dL (ref 6.0–8.3)

## 2017-06-25 LAB — MICROALBUMIN / CREATININE URINE RATIO
Creatinine,U: 89.3 mg/dL
Microalb Creat Ratio: 1.4 mg/g (ref 0.0–30.0)
Microalb, Ur: 1.2 mg/dL (ref 0.0–1.9)

## 2017-06-25 LAB — LIPID PANEL
Cholesterol: 152 mg/dL (ref 0–200)
HDL: 36.3 mg/dL — ABNORMAL LOW (ref >39.00)
LDL Cholesterol: 88 mg/dL (ref 0–99)
NonHDL: 115.31
Total CHOL/HDL Ratio: 4
Triglycerides: 139 mg/dL (ref 0.0–149.0)
VLDL: 27.8 mg/dL (ref 0.0–40.0)

## 2017-06-25 LAB — TESTOSTERONE: Testosterone: 59.19 ng/dL — ABNORMAL LOW (ref 300.00–890.00)

## 2017-06-25 LAB — HEMOGLOBIN A1C: Hgb A1c MFr Bld: 6.8 % — ABNORMAL HIGH (ref 4.6–6.5)

## 2017-06-25 LAB — T4, FREE: Free T4: 1.03 ng/dL (ref 0.60–1.60)

## 2017-07-01 ENCOUNTER — Ambulatory Visit: Payer: Medicare Other | Admitting: Endocrinology

## 2017-07-02 ENCOUNTER — Ambulatory Visit (INDEPENDENT_AMBULATORY_CARE_PROVIDER_SITE_OTHER): Payer: Medicare Other | Admitting: Endocrinology

## 2017-07-02 ENCOUNTER — Encounter: Payer: Self-pay | Admitting: Endocrinology

## 2017-07-02 VITALS — BP 138/68 | HR 68 | Temp 98.3°F | Wt 238.2 lb

## 2017-07-02 DIAGNOSIS — E1165 Type 2 diabetes mellitus with hyperglycemia: Secondary | ICD-10-CM | POA: Diagnosis not present

## 2017-07-02 DIAGNOSIS — E230 Hypopituitarism: Secondary | ICD-10-CM | POA: Diagnosis not present

## 2017-07-02 NOTE — Progress Notes (Signed)
Carl Gutierrez is a 72 y.o. male.            Chief complaint: Followup for endocrinology problems   History of Present Illness:  HYPOPITUITARISM: This is secondary to his pituitary tumor which was treated surgically in 07/2010 He has usually been asymptomatic with his multiple hormone deficiencies except at the time of diagnosis.  History of hypogonadism at baseline levels of about 18.  Initially had decreased libido, some fatigue and also erectile dysfunction His previous testosterone levels have ranged from 64-252 and did not get adequate levels with AndroGel because of poor compliance.  Marland Kitchen  He was switched to testosterone injections and had variable compliance with getting his injections His testosterone level previously was over 400 with injections but on his last visit the level was relatively low about 2 weeks after his 300 mg dosage Records show that his last injection was in January 2017  Subsequently the patient had refused to take any treatment despite explaining to him the benefits of long-term testosterone supplementation and adverse effects of continued hypogonadism  Again he says that he feels fairly good with his energy level  Testosterone level has been consistently low  Lab Results  Component Value Date   TESTOSTERONE 59.19 (L) 06/25/2017    HYPOTHYROIDISM, secondary:  He has secondary hypothyroidism and is  taking 88 mcg Levothyroxine supplement daily.   His dose was reduced in 9/15 because of relatively higher free T4 level  Subsequently has had consistent free T4 levels   No fatigue recently He has taking levothyroxine regularly  Lab Results  Component Value Date   FREET4 1.03 06/25/2017   FREET4 1.05 12/26/2016   FREET4 1.10 09/19/2016   FREET4 1.16 05/23/2016    ADRENAL insufficiency:  He has been continued on steroid replacement with hydrocortisone 20 mg in the morning and 10 mg the evening He is compliant with this as directed No lightheadedness,  weak feeling or nausea Electrolytes normal  Has not had growth hormone deficiency when previously tested   DIABETES: See review of systems   Allergies as of 07/02/2017      Reactions   Hydrochlorothiazide    Leg and side pain with this   Other    Blood pressure med name unknown.      Medication List        Accurate as of 07/02/17 11:13 AM. Always use your most recent med list.          amLODipine 10 MG tablet Commonly known as:  NORVASC Take 1 tablet (10 mg total) by mouth daily.   aspirin 81 MG tablet Take 81 mg by mouth daily. Reported on 09/05/2015   hydrocortisone 20 MG tablet Commonly known as:  CORTEF Take 10-20 mg by mouth 2 (two) times daily. Takes 1 tablet in the morning and 1/2 tablet at night   JANUMET XR [phone] MG Tb24 Generic drug:  SitaGLIPtin-MetFORMIN HCl TAKE 1 TABLET BY MOUTH EVERY DAY WITH DINNER   levothyroxine 88 MCG tablet Commonly known as:  SYNTHROID, LEVOTHROID TAKE 1 TABLET BY MOUTH EVERY DAY   lisinopril 20 MG tablet Commonly known as:  PRINIVIL,ZESTRIL TAKE 1 TABLET (20 MG TOTAL) BY MOUTH DAILY.   metoprolol succinate 25 MG 24 hr tablet Commonly known as:  TOPROL-XL Take 1 tablet (25 mg total) by mouth daily.       Allergies:  Allergies  Allergen Reactions  . Hydrochlorothiazide     Leg and side pain with this  . Other  Blood pressure med name unknown.    Past Medical History:  Diagnosis Date  . CAD S/P percutaneous coronary angioplasty February 2004; 2008   PCI-dLAD: 2.25 mm x 16 mm Express BMS; Ramus- 2.75 mm x 18 mm Cypher DES (2.8 mm); follow-up 11/'08: Patent LAD/ramus stents. 90% small OM. EF 50-60%.; Myoview 1/'12: Exercise 9 min, 10 METS with no ischemia/infarction.   . Clotting disorder ([place])   . Diabetes mellitus    Low-dose metformin  . DJD (degenerative joint disease), lumbosacral     status post L4-L5 surgery  . Essential hypertension   . History of DVT of lower extremity     postoperative  .  Hyperlipidemia with target LDL less than 70   . Hypothyroidism    Synthroid  . Panhypopituitarism ([place])    Status post pituitary adenoma removal in 2012  . Recurrent deep vein thrombosis (DVT) ([place]) 10/01/2015    Past Surgical History:  Procedure Laterality Date  . BRAIN SURGERY  2012   Removal of pituitary adenoma  . CARDIAC CATHETERIZATION  November 2008   Significant LAD tapering but no significant disease the distal stent. Patent Ramus stent. 90% lesion in small OM 2; small nondominant RCA. Medical therapy. EF 50-60%.  . CORONARY ANGIOPLASTY WITH STENT PLACEMENT  February 2004   PCI-dLAD: 2.25 mm x 16 mm BMS;;PCI- RI: 2.75 mm 18 mm Cypher DES  . NM MYOVIEW LTD  January 2012   Exercise ~9 min - 10 METS; no ischemia or infarction  . PROSTATECTOMY N/A 08/13/2015   Procedure: TRANSVESICLE OPEN PROSTATECTOMY** ;  Surgeon: Carolan Clines, MD;  Location: [facility];  Service: Urology;  Laterality: N/A;  . SPINE SURGERY     L4-L5    Family History  Problem Relation Age of Onset  . Cancer Mother        pancreatic ca    Social History:  reports that he quit smoking about 24 years ago. he has never used smokeless tobacco. He reports that he does not drink alcohol or use drugs.  Review of Systems    DIABETES Type II:  He has had mild diabetes for the last few years and previously treated by his primary care physician with metformin only  When his A1c was up to 7.4 in 9/15 he was told to increase his metformin to 3 tablets a day  He was seen by the dietitian in 6/14 and was instructed on cut back on high fat foods, to eat balanced breakfast    Recent history:   Current oral hypoglycemic therapy: Janumet XR 100/1000 daily  He does not want to check his blood sugars at home  His A1c had improved in 7/18 with his trying to lose weight but is now to 6.8 He thinks he has not been following his diet as well or cutting back on portions and making good choices He is recently retired  and he thinks he can cut back on eating out No exercise currently  Lab fasting glucose was 141   Wt Readings from Last 3 Encounters:  07/02/17 238 lb 4 oz (108.1 kg)  12/31/16 235 lb 3.2 oz (106.7 kg)  12/27/16 233 lb 9.6 oz (106 kg)    Lab Results  Component Value Date   HGBA1C 6.8 (H) 06/25/2017   HGBA1C 6.3 12/26/2016   HGBA1C 6.6 (H) 09/19/2016   Lab Results  Component Value Date   MICROALBUR 1.2 06/25/2017   Equality 88 06/25/2017   CREATININE 1.27 06/25/2017    HYPERCHOLESTEROLEMIA:  Treated with simvastatin and followed by PCP LDL below 100  Lab Results  Component Value Date   CHOL 152 06/25/2017   HDL 36.30 (L) 06/25/2017   LDLCALC 88 06/25/2017   LDLDIRECT 85.0 05/18/2014   TRIG 139.0 06/25/2017   CHOLHDL 4 06/25/2017    HYPERTENSION: Treated with amlodipine 10 mg and lisinopril, Also followed by PCP    EXAM:  BP 138/68 (BP Location: Left Arm, Patient Position: Sitting, Cuff Size: Normal)   Pulse 68   Temp 98.3 F (36.8 C) (Oral)   Wt 238 lb 4 oz (108.1 kg)   SpO2 95%   BMI 33.23 kg/m      Assessment/Plan:   PANHYPOPITUITARISM: Persistent hormone defects  Have been present with testosterone, thyroid and cortisone.     Hypogonadism: He has  been  reportedly asymptomatic even with  very low testosterone levels.  No complaints of tiredness or decrease muscle strength Again discussed benefits of treatment and he refuses to consider this now, does not want to add another medication  Secondary hypothyroidism: His free T4 is stable and he will continue 88 g  Secondary adrenal insufficiency.  Subjectively doing well with hydrocortisone supplementation and will continue  DIABETES:  He  previously had improved his diet and also with using Janumet XR his A1c was down to 6.3 He is not doing as well with diet recently and has gained some weight A1c is higher at 6.8  Fasting glucose is relatively higher at 141  Discussed need for improved control  and weight loss He thinks he can do better with diet especially with not working and being able to eat at home more He again refuses to check his blood sugars at home Will have him follow-up in 4 months    Total visit time for evaluation and management of various problems, counseling and review of labs and medications = 25 minutes  Analeigh Aries 07/02/2017, 11:13 AM

## 2017-07-24 ENCOUNTER — Ambulatory Visit (INDEPENDENT_AMBULATORY_CARE_PROVIDER_SITE_OTHER): Payer: Medicare Other | Admitting: Cardiology

## 2017-07-24 ENCOUNTER — Encounter: Payer: Self-pay | Admitting: Cardiology

## 2017-07-24 VITALS — BP 126/82 | HR 63 | Ht 71.0 in | Wt 238.2 lb

## 2017-07-24 DIAGNOSIS — I10 Essential (primary) hypertension: Secondary | ICD-10-CM | POA: Diagnosis not present

## 2017-07-24 DIAGNOSIS — I2510 Atherosclerotic heart disease of native coronary artery without angina pectoris: Secondary | ICD-10-CM | POA: Diagnosis not present

## 2017-07-24 DIAGNOSIS — E785 Hyperlipidemia, unspecified: Secondary | ICD-10-CM | POA: Diagnosis not present

## 2017-07-24 DIAGNOSIS — Z9861 Coronary angioplasty status: Secondary | ICD-10-CM | POA: Diagnosis not present

## 2017-07-24 DIAGNOSIS — E669 Obesity, unspecified: Secondary | ICD-10-CM | POA: Diagnosis not present

## 2017-07-24 NOTE — Patient Instructions (Addendum)
No changes with current medications   Your physician wants you to follow-up in 12 months with DR HARDING.You will receive a reminder letter in the mail two months in advance. If you don't receive a letter, please call our office to schedule the follow-up appointment.   If you need a refill on your cardiac medications before your next appointment, please call your pharmacy.

## 2017-07-24 NOTE — Progress Notes (Signed)
PCP: Wendie Agreste, MD  Clinic Note: Chief Complaint  Patient presents with  . Annual f/u visit    pt states no Sx.  . Coronary Artery Disease    hyperlipidemia    HPI: Carl Barco is a 72 y.o. male with a PMH below who presents today for annual follow-up of CAD-PCI and cardiac risk factors. He also carries a diagnosis of panhypopituitarism.  Carl Gutierrez was last seen in February 2018 .  Was doing quite well without any major complaints.  He remains active and denies any cardiac symptoms such as resting or exertional chest discomfort or dyspnea.  No PND, orthopnea or edema.  His myalgias seem to have improved having stopped statin. He did restart.  Recent Hospitalizations: None  Studies Reviewed: No new studies  Interval History: Carl Gutierrez is doing quite well without any major complaints.  He presents here today along with his wife.  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  He has not yet gotten into an exercise regimen, and has not been as active as he was while working, but is hoping to start getting involved in a exercise plan with walking. He continues to be relatively asymptomatic without any episodes of resting or exertional chest tightness/pressure or dyspnea.  No heart failure symptoms of PND, orthopnea or edema.  He is back on simvastatin and notes that the aching is improved.  It really did not make that much of difference being off of the statin.  Walking exercise is more limited by knee pain type symptoms or myalgias. He denies any significant rapid irregular heartbeats or palpitations.  Remainder of cardiac review of symptoms: Low no no palpitations, lightheadedness, dizziness, weakness or syncope/near syncope. No TIA/amaurosis fugax symptoms. No claudication.  ROS: A comprehensive was performed. Review of Systems  Constitutional: Negative for diaphoresis and weight loss  (Unfortunately gained some of the weight back).  Respiratory: Negative for cough and wheezing.   Cardiovascular: Negative for chest pain and claudication.  Gastrointestinal: Negative for blood in stool, nausea and vomiting.  Genitourinary: Negative for dysuria, flank pain and hematuria.  Musculoskeletal: Positive for joint pain (mostly knees) and myalgias (occasionally in the thighs, probably related to the knees.).  Neurological: Negative for dizziness, focal weakness, seizures, loss of consciousness and weakness.  Endo/Heme/Allergies: Does not bruise/bleed easily.  Psychiatric/Behavioral: Negative for depression.  All other systems reviewed and are negative.   Past Medical History:  Diagnosis Date  . CAD S/P percutaneous coronary angioplasty February 2004; 2008   PCI-dLAD: 2.25 mm x 16 mm Express BMS; Ramus- 2.75 mm x 18 mm Cypher DES (2.8 mm); follow-up 11/'08: Patent LAD/ramus stents. 90% small OM. EF 50-60%.; Myoview 1/'12: Exercise 9 min, 10 METS with no ischemia/infarction.   . Clotting disorder ([place])   . Diabetes mellitus    Low-dose metformin  . DJD (degenerative joint disease), lumbosacral     status post L4-L5 surgery  . Essential hypertension   . History of DVT of lower extremity     postoperative  . Hyperlipidemia with target LDL less than 70   . Hypothyroidism    Synthroid  . Panhypopituitarism ([place])    Status post pituitary adenoma removal in 2012  . Recurrent deep vein thrombosis (DVT) ([place]) 10/01/2015    Past Surgical History:  Procedure Laterality Date  . BRAIN SURGERY  2012   Removal of pituitary adenoma  . CARDIAC CATHETERIZATION  November 2008  Significant LAD tapering but no significant disease the distal stent. Patent Ramus stent. 90% lesion in small OM 2; small nondominant RCA. Medical therapy. EF 50-60%.  . CORONARY ANGIOPLASTY WITH STENT PLACEMENT  February 2004   PCI-dLAD: 2.25 mm x 16 mm BMS;;PCI- RI: 2.75 mm 18 mm Cypher DES  . NM MYOVIEW LTD   January 2012   Exercise ~9 min - 10 METS; no ischemia or infarction  . PROSTATECTOMY N/A 08/13/2015   Procedure: TRANSVESICLE OPEN PROSTATECTOMY** ;  Surgeon: Carolan Clines, MD;  Location: [facility];  Service: Urology;  Laterality: N/A;  . SPINE SURGERY     L4-L5   L leg DVTG 2 yrs ago.   Current Meds  Medication Sig  . amLODipine (NORVASC) 10 MG tablet Take 1 tablet (10 mg total) by mouth daily.  Marland Kitchen aspirin 81 MG tablet Take 81 mg by mouth daily. Reported on 09/05/2015  . hydrocortisone (CORTEF) 20 MG tablet Take 10-20 mg by mouth 2 (two) times daily. Takes 1 tablet in the morning and 1/2 tablet at night  . JANUMET XR [phone] MG TB24 TAKE 1 TABLET BY MOUTH EVERY DAY WITH DINNER  . levothyroxine (SYNTHROID, LEVOTHROID) 88 MCG tablet TAKE 1 TABLET BY MOUTH EVERY DAY  . lisinopril (PRINIVIL,ZESTRIL) 20 MG tablet TAKE 1 TABLET (20 MG TOTAL) BY MOUTH DAILY.  . metoprolol succinate (TOPROL-XL) 25 MG 24 hr tablet Take 1 tablet (25 mg total) by mouth daily.  . simvastatin (ZOCOR) 40 MG tablet Take 40 mg by mouth daily.     Allergies  Allergen Reactions  . Hydrochlorothiazide     Leg and side pain with this  . Other     Blood pressure med name unknown.    Social History   Socioeconomic History  . Marital status: Married    Spouse name: None  . Number of children: None  . Years of education: None  . Highest education level: None  Social Needs  . Financial resource strain: None  . Food insecurity - worry: None  . Food insecurity - inability: None  . Transportation needs - medical: None  . Transportation needs - non-medical: None  Occupational History  . None  Tobacco Use  . Smoking status: Former Smoker    Last attempt to quit: 06/24/1993    Years since quitting: 24.1  . Smokeless tobacco: Never Used  Substance and Sexual Activity  . Alcohol use: No  . Drug use: No  . 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 None  Social History Narrative   He is a married father of 2, grandfather 101. Exercises only occasionally. Not as much as he desires 2. 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   He does not smoke cigarettes -- quit in 1995. Does not drink alcohol.    Family History  Problem Relation Age of Onset  . Cancer Mother        pancreatic ca    Wt Readings from Last 3 Encounters:  07/24/17 238 lb 3.2 oz (108 kg)  07/02/17 238 lb 4 oz (108.1 kg)  12/31/16 235 lb 3.2 oz (106.7 kg)    PHYSICAL EXAM BP 126/82 (BP Location: Left Arm, Patient Position: Sitting, Cuff Size: Large)   Pulse 63   Ht 5\' 11"  (1.803 m)   Wt 238 lb 3.2 oz (108 kg)   BMI 33.22 kg/m   Physical Exam  Constitutional:  He is oriented to person, place, and time. He appears well-developed and well-nourished. No distress.  HENT:  Head: Normocephalic and atraumatic.  Neck: No hepatojugular reflux and no JVD present. Carotid bruit is not present.  Cardiovascular: Normal rate, regular rhythm, normal heart sounds and intact distal pulses.  No extrasystoles are present. PMI is not displaced. Exam reveals no gallop and no friction rub.  No murmur heard. Pulmonary/Chest: Effort normal and breath sounds normal. No respiratory distress. He has no wheezes.  Abdominal: Soft. Bowel sounds are normal. He exhibits no distension. There is no tenderness. There is no rebound.  Musculoskeletal: Normal range of motion. He exhibits no edema.  Neurological: He is alert and oriented to person, place, and time.  Skin: Skin is warm and dry.  Psychiatric: He has a normal mood and affect. His behavior is normal. Judgment and thought content normal.  Nursing note and vitals reviewed.    Adult ECG Report n/a  Other studies Reviewed: Additional studies/ records that were reviewed today include:  Recent Labs:  Last checked in July 2018 - LDL 65 (while on statin) Lab Results  Component Value Date   CHOL 152 06/25/2017   HDL 36.30 (L)  06/25/2017   LDLCALC 88 06/25/2017   LDLDIRECT 85.0 05/18/2014   TRIG 139.0 06/25/2017   CHOLHDL 4 06/25/2017   - after stopping statin for short period.  ASSESSMENT / PLAN: Problem List Items Addressed This Visit    CAD S/P percutaneous coronary angioplasty (Chronic)    He had PCI back in 2004 and then 2008.  No further anginal symptoms since then.  Remains on aspirin, statin, beta-blocker and ACE inhibitor.  Also on amlodipine.  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  No ischemia at that time.  No symptoms. Continue current management.      Relevant Medications   simvastatin (ZOCOR) 40 MG tablet   Other Relevant Orders   EKG 12-Lead   Essential hypertension - Primary (Chronic)    Well-controlled blood pressure on current regimen.  No change.      Relevant Medications   simvastatin (ZOCOR) 40 MG tablet   Hyperlipidemia LDL goal <70 (Chronic)    This is the first reading showing an LDL greater than 70 in some time.  Need to closely monitor to ensure that this was not just simply because he had a statin holiday. He is due to get labs checked in a few months when he has his annual PCP physical.  May need to reconsider treatment options based on those results.  May want to consider adding Zetia versus converting to rosuvastatin      Relevant Medications   simvastatin (ZOCOR) 40 MG tablet   Obesity (BMI 30-39.9) (Chronic)    Hopefully now that he is retired, he will be L to get into an exercise regimen and work on losing weight.  He intends to try to do some exercise with his wife.         Current medicines are reviewed at length with the patient today. (+/- concerns) he asked about simvastatin and concern with knee pain Patient Instructions  No changes with current medications   Your physician wants you to follow-up in 12 months with DR HARDING.You will receive a reminder letter in the mail two months in advance. If you don't receive a letter, please call our office to  schedule the follow-up appointment.   If you need a refill on your cardiac medications before your next appointment, please call your  pharmacy.   Studies Ordered:   Orders Placed This Encounter  Procedures  . EKG 12-Lead     Glenetta Hew, M.D., M.S. Interventional Cardiologist   Pager # [phone] Phone # [phone] [address] [employer] [place],  [address]

## 2017-07-26 ENCOUNTER — Encounter: Payer: Self-pay | Admitting: Cardiology

## 2017-07-26 NOTE — Assessment & Plan Note (Signed)
Hopefully now that he is retired, he will be L to get into an exercise regimen and work on losing weight.  He intends to try to do some exercise with his wife.

## 2017-07-26 NOTE — Assessment & Plan Note (Signed)
Well-controlled blood pressure on current regimen.  No change.

## 2017-07-26 NOTE — Assessment & Plan Note (Signed)
This is the first reading showing an LDL greater than 70 in some time.  Need to closely monitor to ensure that this was not just simply because he had a statin holiday. He is due to get labs checked in a few months when he has his annual PCP physical.  May need to reconsider treatment options based on those results.  May want to consider adding Zetia versus converting to rosuvastatin

## 2017-07-26 NOTE — Assessment & Plan Note (Signed)
He had PCI back in 2004 and then 2008.  No further anginal symptoms since then.  Remains on aspirin, statin, beta-blocker and ACE inhibitor.  Also on amlodipine.  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  No ischemia at that time.  No symptoms. Continue current management.

## 2017-07-29 IMAGING — NM NM MISC PROCEDURE
6 series · 36 of 36 positions shown · non-contrast
Comparison: none

[Series 1: wbr rest · 6.40mm/px · 6 of 64 frames shown]
[frame 6/64]
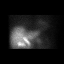
[frame 16/64]
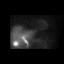
[frame 27/64]
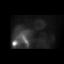
[frame 38/64]
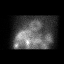
[frame 48/64]
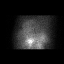
[frame 59/64]
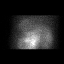

[Series 1: wbr_r-proj_st wbr rest · 6.40mm/px · 6 of 64 frames shown]
[frame 6/64]
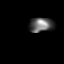
[frame 16/64]
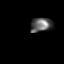
[frame 27/64]
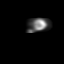
[frame 38/64]
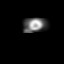
[frame 48/64]
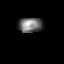
[frame 59/64]
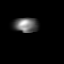

[Series 2: wbr stress-gsp · 6.40mm/px · 6 of 512 frames shown]
[frame 43/512]
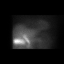
[frame 128/512]
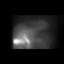
[frame 214/512]
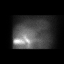
[frame 299/512]
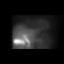
[frame 384/512]
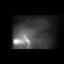
[frame 470/512]
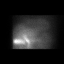

[Series 2: wbr_s-proj_st wbr stress-gsp · 6.40mm/px · 6 of 512 frames shown]
[frame 43/512]
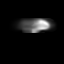
[frame 128/512]
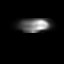
[frame 214/512]
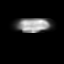
[frame 299/512]
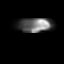
[frame 384/512]
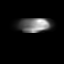
[frame 470/512]
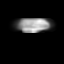

[Series 3: wbr_s-proj_st wbr stress-sum-em · 6.40mm/px · 6 of 64 frames shown]
[frame 6/64]
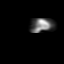
[frame 16/64]
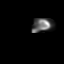
[frame 27/64]
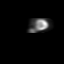
[frame 38/64]
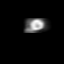
[frame 48/64]
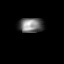
[frame 59/64]
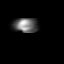

[Series 3: wbr stress-sum-em · 6.40mm/px · 6 of 64 frames shown]
[frame 6/64]
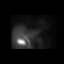
[frame 16/64]
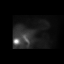
[frame 27/64]
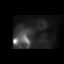
[frame 38/64]
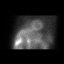
[frame 48/64]
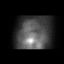
[frame 59/64]
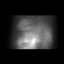

[36 of 36 positions shown; findings below may reference images not displayed]

Canned report from images found in remote index.

Refer to host system for actual result text.

## 2017-08-05 DIAGNOSIS — M109 Gout, unspecified: Secondary | ICD-10-CM | POA: Diagnosis not present

## 2017-08-05 DIAGNOSIS — R799 Abnormal finding of blood chemistry, unspecified: Secondary | ICD-10-CM | POA: Diagnosis not present

## 2017-08-06 ENCOUNTER — Emergency Department (HOSPITAL_COMMUNITY): Payer: Medicare Other

## 2017-08-06 ENCOUNTER — Encounter (HOSPITAL_COMMUNITY): Payer: Self-pay | Admitting: Emergency Medicine

## 2017-08-06 ENCOUNTER — Emergency Department (HOSPITAL_COMMUNITY)
Admission: EM | Admit: 2017-08-06 | Discharge: 2017-08-06 | Disposition: A | Discharge status: Home / Self Care | Payer: Medicare Other | Attending: Emergency Medicine | Admitting: Emergency Medicine

## 2017-08-06 ENCOUNTER — Other Ambulatory Visit: Payer: Self-pay

## 2017-08-06 DIAGNOSIS — I2510 Atherosclerotic heart disease of native coronary artery without angina pectoris: Secondary | ICD-10-CM | POA: Insufficient documentation

## 2017-08-06 DIAGNOSIS — E119 Type 2 diabetes mellitus without complications: Secondary | ICD-10-CM | POA: Insufficient documentation

## 2017-08-06 DIAGNOSIS — Z79899 Other long term (current) drug therapy: Secondary | ICD-10-CM | POA: Diagnosis not present

## 2017-08-06 DIAGNOSIS — I10 Essential (primary) hypertension: Secondary | ICD-10-CM | POA: Insufficient documentation

## 2017-08-06 DIAGNOSIS — M25461 Effusion, right knee: Secondary | ICD-10-CM | POA: Diagnosis not present

## 2017-08-06 DIAGNOSIS — Z955 Presence of coronary angioplasty implant and graft: Secondary | ICD-10-CM | POA: Insufficient documentation

## 2017-08-06 DIAGNOSIS — E039 Hypothyroidism, unspecified: Secondary | ICD-10-CM | POA: Insufficient documentation

## 2017-08-06 DIAGNOSIS — Z87891 Personal history of nicotine dependence: Secondary | ICD-10-CM | POA: Insufficient documentation

## 2017-08-06 DIAGNOSIS — M109 Gout, unspecified: Secondary | ICD-10-CM | POA: Diagnosis not present

## 2017-08-06 DIAGNOSIS — M25561 Pain in right knee: Secondary | ICD-10-CM | POA: Diagnosis present

## 2017-08-06 LAB — BASIC METABOLIC PANEL
Anion gap: 11 (ref 5–15)
BUN: 25 mg/dL — ABNORMAL HIGH (ref 6–20)
CO2: 20 mmol/L — ABNORMAL LOW (ref 22–32)
Calcium: 9 mg/dL (ref 8.9–10.3)
Chloride: 105 mmol/L (ref 101–111)
Creatinine, Ser: 1.51 mg/dL — ABNORMAL HIGH (ref 0.61–1.24)
GFR calc Af Amer: 51 mL/min — ABNORMAL LOW (ref >60)
GFR calc non Af Amer: 44 mL/min — ABNORMAL LOW (ref >60)
Glucose, Bld: 155 mg/dL — ABNORMAL HIGH (ref 65–99)
Potassium: 4.2 mmol/L (ref 3.5–5.1)
Sodium: 136 mmol/L (ref 135–145)

## 2017-08-06 LAB — SYNOVIAL CELL COUNT + DIFF, W/ CRYSTALS
Eosinophils-Synovial: 0 % (ref 0–1)
Lymphocytes-Synovial Fld: 2 % (ref 0–20)
Monocyte-Macrophage-Synovial Fluid: 6 % — ABNORMAL LOW (ref 50–90)
Neutrophil, Synovial: 92 % — ABNORMAL HIGH (ref 0–25)
WBC, Synovial: 20250 /mm3 — ABNORMAL HIGH (ref 0–200)

## 2017-08-06 LAB — CBC
HCT: 39.6 % (ref 39.0–52.0)
Hemoglobin: 13 g/dL (ref 13.0–17.0)
MCH: 28.2 pg (ref 26.0–34.0)
MCHC: 32.8 g/dL (ref 30.0–36.0)
MCV: 85.9 fL (ref 78.0–100.0)
Platelets: 361 10*3/uL (ref 150–400)
RBC: 4.61 MIL/uL (ref 4.22–5.81)
RDW: 16.2 % — ABNORMAL HIGH (ref 11.5–15.5)
WBC: 15.8 10*3/uL — ABNORMAL HIGH (ref 4.0–10.5)

## 2017-08-06 LAB — I-STAT CG4 LACTIC ACID, ED: Lactic Acid, Venous: 1.16 mmol/L (ref 0.5–1.9)

## 2017-08-06 MED ORDER — OXYCODONE-ACETAMINOPHEN 5-325 MG PO TABS
1.0000 | ORAL_TABLET | Freq: Once | ORAL | Status: AC
  Administered 2017-08-06: 1 via ORAL
  Filled 2017-08-06: qty 1

## 2017-08-06 MED ORDER — IBUPROFEN 400 MG PO TABS
600.0000 mg | ORAL_TABLET | Freq: Once | ORAL | Status: AC
  Administered 2017-08-06: 600 mg via ORAL
  Filled 2017-08-06: qty 1

## 2017-08-06 MED ORDER — COLCHICINE 0.6 MG PO TABS
1.2000 mg | ORAL_TABLET | Freq: Once | ORAL | Status: AC
  Administered 2017-08-06: 1.2 mg via ORAL
  Filled 2017-08-06: qty 2

## 2017-08-06 MED ORDER — PREDNISONE 20 MG PO TABS: 40.0000 mg | ORAL_TABLET | Freq: Every day | ORAL | 0 refills | Status: DC

## 2017-08-06 MED ORDER — LIDOCAINE HCL (PF) 1 % IJ SOLN
5.0000 mL | Freq: Once | INTRAMUSCULAR | Status: AC
  Administered 2017-08-06: 5 mL
  Filled 2017-08-06: qty 5

## 2017-08-06 MED ORDER — BUPIVACAINE HCL 0.25 % IJ SOLN
5.0000 mL | Freq: Once | INTRAMUSCULAR | Status: DC
Start: 2017-08-06 — End: 2017-08-06
  Filled 2017-08-06: qty 5

## 2017-08-06 MED ORDER — COLCHICINE 0.6 MG PO TABS: 0.6000 mg | ORAL_TABLET | Freq: Every day | ORAL | 0 refills | Status: DC

## 2017-08-06 MED ORDER — BUPIVACAINE HCL (PF) 0.25 % IJ SOLN
5.0000 mL | Freq: Once | INTRAMUSCULAR | Status: AC
  Administered 2017-08-06: 5 mL
  Filled 2017-08-06: qty 30

## 2017-08-06 MED ORDER — PREDNISONE 20 MG PO TABS
40.0000 mg | ORAL_TABLET | Freq: Once | ORAL | Status: AC
  Administered 2017-08-06: 40 mg via ORAL
  Filled 2017-08-06: qty 2

## 2017-08-06 MED ORDER — OXYCODONE-ACETAMINOPHEN 5-325 MG PO TABS: 1.0000 | ORAL_TABLET | ORAL | 0 refills | Status: DC | PRN

## 2017-08-06 NOTE — ED Notes (Signed)
Patient verbalizes understanding of discharge instructions. Opportunity for questioning and answers were provided. Armband removed by staff, pt discharged from ED.  

## 2017-08-06 NOTE — ED Provider Notes (Signed)
[facility] DEPART[facility] Provider Note   CSN: [id number] Arrival date & time: 08/06/17  1128     History   Chief Complaint Chief Complaint  Patient presents with  . Knee Pain    HPI Carl Gutierrez is a 72 y.o. male.  HPI   71 year old male sent for urgent care for evaluation of right knee pain/swelling.  Onset Sunday.  Progressive worsening since then.  Denies any trauma.  Pain at rest.  Worse with movement.  Cannot bear weight although with significant pain.  No fevers or chills.  He reports a past history of gout but never in this knee.  Past Medical History:  Diagnosis Date  . CAD S/P percutaneous coronary angioplasty February 2004; 2008   PCI-dLAD: 2.25 mm x 16 mm Express BMS; Ramus- 2.75 mm x 18 mm Cypher DES (2.8 mm); follow-up 11/'08: Patent LAD/ramus stents. 90% small OM. EF 50-60%.; Myoview 1/'12: Exercise 9 min, 10 METS with no ischemia/infarction.   . Clotting disorder ([place])   . Diabetes mellitus    Low-dose metformin  . DJD (degenerative joint disease), lumbosacral     status post L4-L5 surgery  . Essential hypertension   . History of DVT of lower extremity     postoperative  . Hyperlipidemia with target LDL less than 70   . Hypothyroidism    Synthroid  . Panhypopituitarism ([place])    Status post pituitary adenoma removal in 2012  . Recurrent deep vein thrombosis (DVT) ([place]) 10/01/2015    Patient Active Problem List   Diagnosis Date Noted  . Anemia in chronic kidney disease 11/30/2015  . Recurrent deep vein thrombosis (DVT) ([place]) 10/01/2015  . Hematuria, unspecified 10/01/2015  . Elevated serum creatinine 10/01/2015  . BPH (benign prostatic hyperplasia) 08/13/2015  . Benign localized hyperplasia of prostate with urinary obstruction 07/23/2015  . Preop cardiovascular exam 07/17/2015  . Encounter for CDL (commercial driving license) exam 40/98/1191  . Obesity (BMI 30-39.9) 06/26/2013  . Essential hypertension 06/26/2013  .  Panhypopituitarism ([place]) 11/17/2011  .   11/17/2011  . Diabetes mellitus ([place]) 11/17/2011  . Hyperlipidemia LDL goal <70 11/17/2011  . CAD S/P percutaneous coronary angioplasty 07/19/2002    Past Surgical History:  Procedure Laterality Date  . BRAIN SURGERY  2012   Removal of pituitary adenoma  . CARDIAC CATHETERIZATION  November 2008   Significant LAD tapering but no significant disease the distal stent. Patent Ramus stent. 90% lesion in small OM 2; small nondominant RCA. Medical therapy. EF 50-60%.  . CORONARY ANGIOPLASTY WITH STENT PLACEMENT  February 2004   PCI-dLAD: 2.25 mm x 16 mm BMS;;PCI- RI: 2.75 mm 18 mm Cypher DES  . NM MYOVIEW LTD  January 2012   Exercise ~9 min - 10 METS; no ischemia or infarction  . PROSTATECTOMY N/A 08/13/2015   Procedure: TRANSVESICLE OPEN PROSTATECTOMY** ;  Surgeon: Carolan Clines, MD;  Location: [facility];  Service: Urology;  Laterality: N/A;  . SPINE SURGERY     L4-L5       Home Medications    Prior to Admission medications   Medication Sig Start Date End Date Taking? Authorizing Provider  amLODipine (NORVASC) 10 MG tablet Take 1 tablet (10 mg total) by mouth daily. 07/24/16   Leonie Man, MD  aspirin 81 MG tablet Take 81 mg by mouth daily. Reported on 09/05/2015    [provider]  hydrocortisone (CORTEF) 20 MG tablet Take 10-20 mg by mouth 2 (two) times daily. Takes 1 tablet in  the morning and 1/2 tablet at night    [provider]  JANUMET XR [phone] MG TB24 TAKE 1 TABLET BY MOUTH EVERY DAY WITH DINNER 06/24/17   Elayne Snare, MD  levothyroxine (SYNTHROID, LEVOTHROID) 88 MCG tablet TAKE 1 TABLET BY MOUTH EVERY DAY 03/03/17   Wendie Agreste, MD  lisinopril (PRINIVIL,ZESTRIL) 20 MG tablet TAKE 1 TABLET (20 MG TOTAL) BY MOUTH DAILY. 07/24/16   Leonie Man, MD  metoprolol succinate (TOPROL-XL) 25 MG 24 hr tablet Take 1 tablet (25 mg total) by mouth daily. 07/24/16   Leonie Man, MD  simvastatin (ZOCOR) 40 MG tablet Take  40 mg by mouth daily.    [provider]  testosterone cypionate (DEPO-TESTOSTERONE) 200 MG/ML injection Inject 1.5 mLs (300 mg total) into the muscle every 21 ( twenty-one) days. 11/23/14 03/14/15  Elayne Snare, MD    Family History Family History  Problem Relation Age of Onset  . Cancer Mother        pancreatic ca    Social History Social History   Tobacco Use  . Smoking status: Former Smoker    Last attempt to quit: 06/24/1993    Years since quitting: 24.1  . Smokeless tobacco: Never Used  Substance Use Topics  . Alcohol use: No  . Drug use: No     Allergies   Hydrochlorothiazide and Other   Review of Systems Review of Systems  All systems reviewed and negative, other than as noted in HPI.  Physical Exam Updated Vital Signs BP 100/70 (BP Location: Right Arm)   Pulse 65   Temp 98.8 F (37.1 C) (Oral)   Resp 18   SpO2 100%   Physical Exam  Constitutional: He appears well-developed and well-nourished. No distress.  HENT:  Head: Normocephalic and atraumatic.  Eyes: Conjunctivae are normal. Right eye exhibits no discharge. Left eye exhibits no discharge.  Neck: Neck supple.  Cardiovascular: Normal rate, regular rhythm and normal heart sounds. Exam reveals no gallop and no friction rub.  No murmur heard. Pulmonary/Chest: Effort normal and breath sounds normal. No respiratory distress.  Abdominal: Soft. He exhibits no distension. There is no tenderness.  Musculoskeletal: He exhibits no edema or tenderness.  Right knee is swollen.  Effusion.  Very faint erythema.  Increased warmth.  Tender to palpation.  Can actively range although with increased pain.  Neurovascular intact distally.  No calf swelling/tenderness.  Neurological: He is alert.  Skin: Skin is warm and dry.  Psychiatric: He has a normal mood and affect. His behavior is normal. Thought content normal.  Nursing note and vitals reviewed.    ED Treatments / Results  Labs (all labs ordered are  listed, but only abnormal results are displayed) Labs Reviewed  CBC - Abnormal; Notable for the following components:      Result Value   WBC 15.8 (*)    RDW 16.2 (*)    All other components within normal limits  BASIC METABOLIC PANEL - Abnormal; Notable for the following components:   CO2 20 (*)    Glucose, Bld 155 (*)    BUN 25 (*)    Creatinine, Ser 1.51 (*)    GFR calc non Af Amer 44 (*)    GFR calc Af Amer 51 (*)    All other components within normal limits  SYNOVIAL CELL COUNT + DIFF, W/ CRYSTALS - Abnormal; Notable for the following components:   Appearance-Synovial CLOUDY (*)    WBC, Synovial 20,250 (*)    Neutrophil,  Synovial 92 (*)    Monocyte-Macrophage-Synovial Fluid 6 (*)    All other components within normal limits  BODY FLUID CULTURE  GRAM STAIN  GLUCOSE, BODY FLUID OTHER  PROTEIN, BODY FLUID (OTHER)  I-STAT CG4 LACTIC ACID, ED    EKG  EKG Interpretation None       Radiology Dg Knee Complete 4 Views Right  Result Date: 08/06/2017 CLINICAL DATA:  Knee pain. EXAM: RIGHT KNEE - COMPLETE 4+ VIEW COMPARISON:  09/11/2009. FINDINGS: No acute bony or joint abnormality identified. No evidence of fracture. Small loose bodies. Moderate knee joint effusion. Peripheral vascular calcification. IMPRESSION: 1. Mild medial and patellofemoral compartment degenerative change. Small loose bodies. No acute bony abnormality. 2.  Moderate knee joint effusion. 3.  Peripheral vascular disease. Electronically Signed   By: Marcello Moores  Register   On: 08/06/2017 12:41    Procedures .Joint Aspiration/Arthrocentesis Date/Time: 08/06/2017 5:00 PM Performed by: Virgel Manifold, MD Authorized by: Virgel Manifold, MD   Consent:    Consent obtained:  Verbal   Consent given by:  Patient   Risks discussed:  Bleeding, pain and infection Location:    Location:  Knee   Knee:  R knee Anesthesia (see MAR for exact dosages):    Anesthesia method:  Local infiltration   Local anesthetic:   Lidocaine 1% w/o epi and bupivacaine 0.25% w/o epi Procedure details:    Preparation: Patient was prepped and draped in usual sterile fashion     Needle gauge:  18 G   Ultrasound guidance: no     Approach:  Medial   Aspirate characteristics:  Yellow   Steroid injected: no     Specimen collected: yes   Post-procedure details:    Dressing:  Adhesive bandage   Patient tolerance of procedure:  Tolerated well, no immediate complications Comments:     60cc of hazy yellow fluid aspirated. 10cc of 1:1 lidocaine/marcaine injected   (including critical care time)  Medications Ordered in ED Medications  oxyCODONE-acetaminophen (PERCOCET/ROXICET) 5-325 MG per tablet 1 tablet (1 tablet Oral Given 08/06/17 1428)     Initial Impression / Assessment and Plan / ED Course  I have reviewed the triage vital signs and the nursing notes.  Pertinent labs & imaging results that were available during my care of the patient were reviewed by me and considered in my medical decision making (see chart for details).     Atraumatic right knee pain and swelling.  Likely gout.  Will treat as such.  Fluid culture sent.  Final Clinical Impressions(s) / ED Diagnoses   Final diagnoses:  None    ED Discharge Orders    None       Virgel Manifold, MD 08/07/17 0001

## 2017-08-06 NOTE — ED Triage Notes (Signed)
Pt arrives via POV from home with right knee pain since Sunday. Seen at [facility] yesterday, had labs drawn, called today and told to come to ed for R/O septic joint. Pt with elevated WBCs, red, swollen right knee. Pt alert, oriented x4, appropriate, VSS.

## 2017-08-07 LAB — GRAM STAIN

## 2017-08-08 LAB — GLUCOSE, BODY FLUID OTHER: Glucose, Body Fluid Other: 85 mg/dL

## 2017-08-08 LAB — PROTEIN, BODY FLUID (OTHER): Total Protein, Body Fluid Other: 4.8 g/dL

## 2017-08-12 LAB — CULTURE, BODY FLUID W GRAM STAIN -BOTTLE: Culture: NO GROWTH

## 2017-08-29 ENCOUNTER — Other Ambulatory Visit: Payer: Self-pay | Admitting: Family Medicine

## 2017-08-29 DIAGNOSIS — E039 Hypothyroidism, unspecified: Secondary | ICD-10-CM

## 2017-08-31 ENCOUNTER — Other Ambulatory Visit: Payer: Self-pay | Admitting: Family Medicine

## 2017-08-31 DIAGNOSIS — E039 Hypothyroidism, unspecified: Secondary | ICD-10-CM

## 2017-09-06 ENCOUNTER — Other Ambulatory Visit: Payer: Self-pay | Admitting: Family Medicine

## 2017-09-06 DIAGNOSIS — E039 Hypothyroidism, unspecified: Secondary | ICD-10-CM

## 2017-09-18 ENCOUNTER — Other Ambulatory Visit: Payer: Self-pay | Admitting: Cardiology

## 2017-09-18 NOTE — Telephone Encounter (Signed)
Rx has been sent to the pharmacy electronically. ° °

## 2017-09-22 ENCOUNTER — Other Ambulatory Visit: Payer: Self-pay | Admitting: Cardiology

## 2017-09-22 NOTE — Telephone Encounter (Signed)
REFILL 

## 2017-09-25 ENCOUNTER — Other Ambulatory Visit: Payer: Self-pay | Admitting: Endocrinology

## 2017-10-27 ENCOUNTER — Other Ambulatory Visit (INDEPENDENT_AMBULATORY_CARE_PROVIDER_SITE_OTHER): Payer: Medicare Other

## 2017-10-27 DIAGNOSIS — E1165 Type 2 diabetes mellitus with hyperglycemia: Secondary | ICD-10-CM | POA: Diagnosis not present

## 2017-10-27 LAB — BASIC METABOLIC PANEL
BUN: 16 mg/dL (ref 6–23)
CO2: 24 mEq/L (ref 19–32)
Calcium: 9 mg/dL (ref 8.4–10.5)
Chloride: 108 mEq/L (ref 96–112)
Creatinine, Ser: 1.24 mg/dL (ref 0.40–1.50)
GFR: 73.31 mL/min (ref >60.00)
Glucose, Bld: 127 mg/dL — ABNORMAL HIGH (ref 70–99)
Potassium: 4.1 mEq/L (ref 3.5–5.1)
Sodium: 142 mEq/L (ref 135–145)

## 2017-10-27 LAB — HEMOGLOBIN A1C: Hgb A1c MFr Bld: 7.1 % — ABNORMAL HIGH (ref 4.6–6.5)

## 2017-10-29 NOTE — Progress Notes (Signed)
Carl Gutierrez is a 72 y.o. male.            Chief complaint: Followup for endocrinology problems   History of Present Illness:  HYPOPITUITARISM: This is secondary to his pituitary tumor which was treated surgically in 07/2010 He has been asymptomatic with his multiple hormone deficiencies except at the time of diagnosis.  History of hypogonadism at baseline testosterone levels of about 18.  Initially had decreased libido, some fatigue and also erectile dysfunction His did not get adequate levels with AndroGel because of poor compliance.  Marland Kitchen  He was switched to testosterone injections and had variable compliance with getting his injections Records show that his last injection was in January 2017  Subsequently the patient had refused to take any treatment after his episode of DVT in 3/17  Refuses to consider treatment again even with AndroGel despite explaining to him the benefits of long-term testosterone supplementation and adverse effects of continued hypogonadism  Testosterone level has been consistently low  Lab Results  Component Value Date   TESTOSTERONE 59.19 (L) 06/25/2017    HYPOTHYROIDISM, secondary:  He has secondary hypothyroidism treated with 88 mcg Levothyroxine supplement daily.    His dose has been the same since about 2015   No complaints of fatigue recently He has taking levothyroxine regularly Free T4 level is consistent  Lab Results  Component Value Date   FREET4 1.03 06/25/2017   FREET4 1.05 12/26/2016   FREET4 1.10 09/19/2016   FREET4 1.16 05/23/2016    ADRENAL insufficiency:  He has been continued on steroid replacement with hydrocortisone 20 mg in the morning and 10 mg the evening  No lightheadedness, weakness feeling or nausea  Has not had growth hormone deficiency when previously tested   DIABETES: See review of systems   Allergies as of 10/30/2017      Reactions   Hydrochlorothiazide    Leg and side pain with this   Other    Blood  pressure med name unknown.      Medication List        Accurate as of 10/30/17  8:06 AM. Always use your most recent med list.          amLODipine 10 MG tablet Commonly known as:  NORVASC TAKE 1 TABLET (10 MG TOTAL) BY MOUTH DAILY.   aspirin 81 MG tablet Take 81 mg by mouth daily. Reported on 09/05/2015   hydrocortisone 20 MG tablet Commonly known as:  CORTEF Take 10-20 mg by mouth 2 (two) times daily. Takes 1 tablet in the morning and 1/2 tablet at night   JANUMET XR [phone] MG Tb24 Generic drug:  SitaGLIPtin-MetFORMIN HCl TAKE 1 TABLET BY MOUTH EVERY DAY WITH DINNER   levothyroxine 88 MCG tablet Commonly known as:  SYNTHROID, LEVOTHROID TAKE 1 TABLET BY MOUTH EVERY DAY   lisinopril 20 MG tablet Commonly known as:  PRINIVIL,ZESTRIL TAKE 1 TABLET (20 MG TOTAL) BY MOUTH DAILY.   metoprolol succinate 25 MG 24 hr tablet Commonly known as:  TOPROL-XL TAKE 1 TABLET (25 MG TOTAL) BY MOUTH DAILY.   simvastatin 40 MG tablet Commonly known as:  ZOCOR Take 40 mg by mouth at bedtime.       Allergies:  Allergies  Allergen Reactions  . Hydrochlorothiazide     Leg and side pain with this  . Other     Blood pressure med name unknown.    Past Medical History:  Diagnosis Date  . CAD S/P percutaneous coronary angioplasty February 2004; 2008  PCI-dLAD: 2.25 mm x 16 mm Express BMS; Ramus- 2.75 mm x 18 mm Cypher DES (2.8 mm); follow-up 11/'08: Patent LAD/ramus stents. 90% small OM. EF 50-60%.; Myoview 1/'12: Exercise 9 min, 10 METS with no ischemia/infarction.   . Clotting disorder ([place])   . Diabetes mellitus    Low-dose metformin  . DJD (degenerative joint disease), lumbosacral     status post L4-L5 surgery  . Essential hypertension   . History of DVT of lower extremity     postoperative  . Hyperlipidemia with target LDL less than 70   . Hypothyroidism    Synthroid  . Panhypopituitarism ([place])    Status post pituitary adenoma removal in 2012  . Recurrent deep vein  thrombosis (DVT) ([place]) 10/01/2015    Past Surgical History:  Procedure Laterality Date  . BRAIN SURGERY  2012   Removal of pituitary adenoma  . CARDIAC CATHETERIZATION  November 2008   Significant LAD tapering but no significant disease the distal stent. Patent Ramus stent. 90% lesion in small OM 2; small nondominant RCA. Medical therapy. EF 50-60%.  . CORONARY ANGIOPLASTY WITH STENT PLACEMENT  February 2004   PCI-dLAD: 2.25 mm x 16 mm BMS;;PCI- RI: 2.75 mm 18 mm Cypher DES  . NM MYOVIEW LTD  January 2012   Exercise ~9 min - 10 METS; no ischemia or infarction  . PROSTATECTOMY N/A 08/13/2015   Procedure: TRANSVESICLE OPEN PROSTATECTOMY** ;  Surgeon: Carolan Clines, MD;  Location: [facility];  Service: Urology;  Laterality: N/A;  . SPINE SURGERY     L4-L5    Family History  Problem Relation Age of Onset  . Cancer Mother        pancreatic ca    Social History:  reports that he quit smoking about 24 years ago. He has never used smokeless tobacco. He reports that he does not drink alcohol or use drugs.  Review of Systems    DIABETES Type II:  He has had mild diabetes for several years and previously treated by his primary care physician with low-dose metformin only  When his A1c was up to 7.4 in 9/15 he was told to increase his metformin to 3 tablets a day    Recent history:   Current oral hypoglycemic therapy: Janumet XR 100/1000 daily  He does not want to check his home blood sugars   His A1c is gradually increasing, now 7.1  Has difficulty losing weight He does try to continue with his Janumet consistently He is still eating regularly and watching his diet consistently He has limited ability to exercise because of knee pain  He was seen by the dietitian in 6/14 and was instructed on cut back on high fat foods, to eat balanced breakfast    Lab fasting glucose was 127   Wt Readings from Last 3 Encounters:  10/30/17 236 lb 3.2 oz (107.1 kg)  07/24/17 238 lb 3.2 oz  (108 kg)  07/02/17 238 lb 4 oz (108.1 kg)    Lab Results  Component Value Date   HGBA1C 7.1 (H) 10/27/2017   HGBA1C 6.8 (H) 06/25/2017   HGBA1C 6.3 12/26/2016   Lab Results  Component Value Date   MICROALBUR 1.2 06/25/2017   [place] 88 06/25/2017   CREATININE 1.24 10/27/2017    HYPERCHOLESTEROLEMIA: Treated with simvastatin 40 mg and followed by PCP LDL below 100  Lab Results  Component Value Date   CHOL 152 06/25/2017   HDL 36.30 (L) 06/25/2017   LDLCALC 88 06/25/2017   LDLDIRECT  85.0 05/18/2014   TRIG 139.0 06/25/2017   CHOLHDL 4 06/25/2017    HYPERTENSION: Treated with amlodipine 10 mg and lisinopril, he does see the cardiologist for this    EXAM:  BP 136/72 (BP Location: Left Arm, Patient Position: Standing, Cuff Size: Normal)   Pulse 64   Ht 5\' 11"  (1.803 m)   Wt 236 lb 3.2 oz (107.1 kg)   SpO2 95%   BMI 32.94 kg/m      Assessment/Plan:   PANHYPOPITUITARISM: Persistent hormone defects  Have been present with testosterone, thyroid and cortisone.    Secondary hypothyroidism:   His free T4 is stable and he will continue 88 g   Hypogonadism: He has  been having minimal symptoms even with  very low testosterone levels.   No complaints of tiredness Does have erectile dysfunction  Again he still refuses to consider testosterone supplementation   Secondary adrenal insufficiency.  Subjectively doing well with hydrocortisone supplementation and will continue  DIABETES:  He  previously had improved his diet and also with using Janumet XR his A1c was down to 6.3 in 2018  A1c is getting progressively higher at 7.1 now Discussed need for improved lifestyle and weight loss He thinks he can do better with diet with less eating out, better choices and he has previously been to the dietitian Currently he does not want to go back to the dietitian Also encouraged him to start going to the [facility] for various exercises since he cannot do much walking   Elayne Snare 10/30/2017, 8:06 AM

## 2017-10-30 ENCOUNTER — Encounter: Payer: Self-pay | Admitting: Endocrinology

## 2017-10-30 ENCOUNTER — Ambulatory Visit (INDEPENDENT_AMBULATORY_CARE_PROVIDER_SITE_OTHER): Payer: Medicare Other | Admitting: Endocrinology

## 2017-10-30 VITALS — BP 136/72 | HR 64 | Ht 71.0 in | Wt 236.2 lb

## 2017-10-30 DIAGNOSIS — Z9861 Coronary angioplasty status: Secondary | ICD-10-CM

## 2017-10-30 DIAGNOSIS — E230 Hypopituitarism: Secondary | ICD-10-CM

## 2017-10-30 DIAGNOSIS — E1165 Type 2 diabetes mellitus with hyperglycemia: Secondary | ICD-10-CM | POA: Diagnosis not present

## 2017-10-30 DIAGNOSIS — I2510 Atherosclerotic heart disease of native coronary artery without angina pectoris: Secondary | ICD-10-CM

## 2017-10-30 NOTE — Patient Instructions (Signed)
Go join gym  Better diet

## 2017-11-16 ENCOUNTER — Encounter: Payer: Self-pay | Admitting: Family Medicine

## 2017-11-16 ENCOUNTER — Ambulatory Visit (INDEPENDENT_AMBULATORY_CARE_PROVIDER_SITE_OTHER): Payer: Medicare Other | Admitting: Family Medicine

## 2017-11-16 DIAGNOSIS — E785 Hyperlipidemia, unspecified: Secondary | ICD-10-CM

## 2017-11-16 DIAGNOSIS — Z0000 Encounter for general adult medical examination without abnormal findings: Secondary | ICD-10-CM

## 2017-11-16 NOTE — Patient Instructions (Addendum)
Keep follow up with your eye specialist as planned.  If you would like to meet with an audiologist to see if hearing aids would help, let me know.  Please follow up with me to discuss your knee pain further if needed. Tylenol on occasion if needed.  Keep follow up as planned for colonoscopy.  Thank you for coming in today.  I do recommend meeting with a dentist.   Preventive Care 31 Years and Older, Male Preventive care refers to lifestyle choices and visits with your health care provider that can promote health and wellness. What does preventive care include?  A yearly physical exam. This is also called an annual well check.  Dental exams once or twice a year.  Routine eye exams. Ask your health care provider how often you should have your eyes checked.  Personal lifestyle choices, including: ? Daily care of your teeth and gums. ? Regular physical activity. ? Eating a healthy diet. ? Avoiding tobacco and drug use. ? Limiting alcohol use. ? Practicing safe sex. ? Taking low doses of aspirin every day. ? Taking vitamin and mineral supplements as recommended by your health care provider. What happens during an annual well check? The services and screenings done by your health care provider during your annual well check will depend on your age, overall health, lifestyle risk factors, and family history of disease. Counseling Your health care provider may ask you questions about your:  Alcohol use.  Tobacco use.  Drug use.  Emotional well-being.  Home and relationship well-being.  Sexual activity.  Eating habits.  History of falls.  Memory and ability to understand (cognition).  Work and work Statistician.  Screening You may have the following tests or measurements:  Height, weight, and BMI.  Blood pressure.  Lipid and cholesterol levels. These may be checked every 5 years, or more frequently if you are over 74 years old.  Skin check.  Lung cancer screening.  You may have this screening every year starting at age 74 if you have a 30-pack-year history of smoking and currently smoke or have quit within the past 15 years.  Fecal occult blood test (FOBT) of the stool. You may have this test every year starting at age 74  Flexible sigmoidoscopy or colonoscopy. You may have a sigmoidoscopy every 5 years or a colonoscopy every 10 years starting at age 74  Prostate cancer screening. Recommendations will vary depending on your family history and other risks.  Hepatitis C blood test.  Hepatitis B blood test.  Sexually transmitted disease (STD) testing.  Diabetes screening. This is done by checking your blood sugar (glucose) after you have not eaten for a while (fasting). You may have this done every 1-3 years.  Abdominal aortic aneurysm (AAA) screening. You may need this if you are a current or former smoker.  Osteoporosis. You may be screened starting at age 74 if you are at high risk.  Talk with your health care provider about your test results, treatment options, and if necessary, the need for more tests. Vaccines Your health care provider may recommend certain vaccines, such as:  Influenza vaccine. This is recommended every year.  Tetanus, diphtheria, and acellular pertussis (Tdap, Td) vaccine. You may need a Td booster every 10 years.  Varicella vaccine. You may need this if you have not been vaccinated.  Zoster vaccine. You may need this after age 74  Measles, mumps, and rubella (MMR) vaccine. You may need at least one dose of MMR if you  were born in [birth year] or later. You may also need a second dose.  Pneumococcal 13-valent conjugate (PCV13) vaccine. One dose is recommended after age 23.  Pneumococcal polysaccharide (PPSV23) vaccine. One dose is recommended after age 13.  Meningococcal vaccine. You may need this if you have certain conditions.  Hepatitis A vaccine. You may need this if you have certain conditions or if you travel or  work in places where you may be exposed to hepatitis A.  Hepatitis B vaccine. You may need this if you have certain conditions or if you travel or work in places where you may be exposed to hepatitis B.  Haemophilus influenzae type b (Hib) vaccine. You may need this if you have certain risk factors.  Talk to your health care provider about which screenings and vaccines you need and how often you need them. This information is not intended to replace advice given to you by your health care provider. Make sure you discuss any questions you have with your health care provider. Document Released: 06/29/2015 Document Revised: 02/20/2016 Document Reviewed: 04/03/2015 Elsevier Interactive Patient Education  2018 [employer].     IF you received an x-ray today, you will receive an invoice from [facility]. Please contact [facility] at [phone] with questions or concerns regarding your invoice.   IF you received labwork today, you will receive an invoice from [place]. Please contact [facility] at [phone] with questions or concerns regarding your invoice.   Our billing staff will not be able to assist you with questions regarding bills from these companies.  You will be contacted with the lab results as soon as they are available. The fastest way to get your results is to activate your My Chart account. Instructions are located on the last page of this paperwork. If you have not heard from [place] regarding the results in 2 weeks, please contact this office.

## 2017-11-16 NOTE — Progress Notes (Signed)
Subjective:  By signing my name below, I, Carl Gutierrez, attest that this documentation has been prepared under the direction and in the presence of [name], MD. Electronically Signed: Moises Gutierrez, [place]. 11/16/2017 , 8:38 AM .  Patient was seen in Room 10 .   Patient ID: Carl Gutierrez, male    DOB: 31-Mar-1946, 72 y.o.   MRN: [id number] No chief complaint on file.  HPI Carl Gutierrez is a 72 y.o. male Here for annual physical. He has a history of DM, HTN and CAD.   He has plans for the summer; recently traveled up to [place], [place] to see the new museum.   Diabetes He's followed by endocrinologist, Dr. Dwyane Dee. He is on Janumet [phone] mg; also on ACE inhibitor and statin. He was last seen by endocrinologist on May 17th. His most recent A1C was 7.1, up from 6.8; planned for improved diet. He was also being treated for hypopituitarism after surgery of tumor in 2012. He does have hypogonadism but declines treatment. For adrenal deficiency, he takes hydrocortisone 20 mg in the morning and 10 mg in the evening.   Hypothyroidism  He takes Levothyroxine 88 mcg qd.   Cardiac He has history of CAD and HTN, followed by cardiologist Dr. Ellyn Hack. He has a history of CAD with PCI (2004 and 2008). He had a negative myoview in 2015.   Hyperlipidemia Lab Results  Component Value Date   CHOL 152 06/25/2017   HDL 36.30 (L) 06/25/2017   LDLCALC 88 06/25/2017   LDLDIRECT 85.0 05/18/2014   TRIG 139.0 06/25/2017   CHOLHDL 4 06/25/2017   Lab Results  Component Value Date   ALT 15 06/25/2017   AST 12 06/25/2017   ALKPHOS 65 06/25/2017   BILITOT 0.3 06/25/2017   Plan from cardiology, consider adding Zetia if persistent LDL.   Cancer Screening Colonoscopy: discussed last year, reportedly done at [facility] hospital. Reports having it done again at the [place]. Patient states he will contact [place] for another colonoscopy.  Prostate cancer screening: declines testing today.   Immunizations Immunization  History  Administered Date(s) Administered  . Influenza-Unspecified 03/16/2013, 04/05/2014, 04/03/2015, 04/22/2016  . Pneumococcal Conjugate-13 01/22/2015  . Pneumococcal Polysaccharide-23 06/16/2009  . Tdap 06/16/2005   Shingles vaccine: declined shingles vaccine last year.   Hep C screening: declined this screening last year.  Fall screening: no falls in past year.   Depression Depression screen [facility] 2/9 11/16/2017 12/27/2016 11/13/2016 01/10/2016 09/05/2015  Decreased Interest 0 0 0 0 0  Down, Depressed, Hopeless 0 0 0 0 0  PHQ - 2 Score 0 0 0 0 0    Vision  Visual Acuity Screening   Right eye Left eye Both eyes  Without correction: 20/40-1 20/30-2 20/40-1  With correction:      He notes seeing eye doctor in Dec.   Dentist He hasn't seen a dentist recently.   Exercise He's walking for exercise.   Functional Status Survey Is the patient deaf or have difficulty hearing?: Yes, difficulty hearing; had an audio test at the [place]. Notes hearing issues since [employer].  Does the patient have difficulty seeing, even when wearing glasses/contacts?: Yes; eyes watery, not as good as they're used to be. Last seen eye doctor in Dec, has an appointment coming up.  Does the patient have difficulty concentrating, remembering, or making decisions?: No Does the patient have difficulty walking or climbing stairs?: Yes; bad knees, left knee more sore recently. He denies taking any medications for it. He isn't  followed by orthopedist for this. Some days with more difficulty.  Does the patient have difficulty dressing or bathing?: No Does the patient have difficulty doing errands alone such as visiting a doctor's office or shopping?: No  Mental Status Survey 6CIT Screen 11/16/2017  What Year? 0 points  What month? 0 points  What time? 0 points  Count back from 20 0 points  Months in reverse 0 points  Repeat phrase 0 points  Total Score 0    Advanced Directives He has a health care Power  of Attorney and living will.    Patient Active Problem List   Diagnosis Date Noted  . Anemia in chronic kidney disease 11/30/2015  . Recurrent deep vein thrombosis (DVT) ([place]) 10/01/2015  . Hematuria, unspecified 10/01/2015  . Elevated serum creatinine 10/01/2015  . BPH (benign prostatic hyperplasia) 08/13/2015  . Benign localized hyperplasia of prostate with urinary obstruction 07/23/2015  . Preop cardiovascular exam 07/17/2015  . Encounter for CDL (commercial driving license) exam 88/91/6945  . Obesity (BMI 30-39.9) 06/26/2013  . Essential hypertension 06/26/2013  . Panhypopituitarism ([place]) 11/17/2011  .   11/17/2011  . Diabetes mellitus ([place]) 11/17/2011  . Hyperlipidemia LDL goal <70 11/17/2011  . CAD S/P percutaneous coronary angioplasty 07/19/2002   Past Medical History:  Diagnosis Date  . CAD S/P percutaneous coronary angioplasty February 2004; 2008   PCI-dLAD: 2.25 mm x 16 mm Express BMS; Ramus- 2.75 mm x 18 mm Cypher DES (2.8 mm); follow-up 11/'08: Patent LAD/ramus stents. 90% small OM. EF 50-60%.; Myoview 1/'12: Exercise 9 min, 10 METS with no ischemia/infarction.   . Clotting disorder ([place])   . Diabetes mellitus    Low-dose metformin  . DJD (degenerative joint disease), lumbosacral     status post L4-L5 surgery  . Essential hypertension   . History of DVT of lower extremity     postoperative  . Hyperlipidemia with target LDL less than 70   . Hypothyroidism    Synthroid  . Panhypopituitarism ([place])    Status post pituitary adenoma removal in 2012  . Recurrent deep vein thrombosis (DVT) ([place]) 10/01/2015   Past Surgical History:  Procedure Laterality Date  . BRAIN SURGERY  2012   Removal of pituitary adenoma  . CARDIAC CATHETERIZATION  November 2008   Significant LAD tapering but no significant disease the distal stent. Patent Ramus stent. 90% lesion in small OM 2; small nondominant RCA. Medical therapy. EF 50-60%.  . CORONARY ANGIOPLASTY WITH STENT PLACEMENT   February 2004   PCI-dLAD: 2.25 mm x 16 mm BMS;;PCI- RI: 2.75 mm 18 mm Cypher DES  . NM MYOVIEW LTD  January 2012   Exercise ~9 min - 10 METS; no ischemia or infarction  . PROSTATECTOMY N/A 08/13/2015   Procedure: TRANSVESICLE OPEN PROSTATECTOMY** ;  Surgeon: Carolan Clines, MD;  Location: [facility];  Service: Urology;  Laterality: N/A;  . SPINE SURGERY     L4-L5   Allergies  Allergen Reactions  . Hydrochlorothiazide     Leg and side pain with this  . Other     Gutierrez pressure med name unknown.   Prior to Admission medications   Medication Sig Start Date End Date Taking? Authorizing Provider  amLODipine (NORVASC) 10 MG tablet TAKE 1 TABLET (10 MG TOTAL) BY MOUTH DAILY. 09/22/17  Yes Leonie Man, MD  aspirin 81 MG tablet Take 81 mg by mouth daily. Reported on 09/05/2015   Yes [provider]  hydrocortisone (CORTEF) 20 MG tablet Take 10-20 mg by  mouth 2 (two) times daily. Takes 1 tablet in the morning and 1/2 tablet at night   Yes [provider]  JANUMET XR [phone] MG TB24 TAKE 1 TABLET BY MOUTH EVERY DAY WITH DINNER 09/25/17  Yes Elayne Snare, MD  levothyroxine (SYNTHROID, LEVOTHROID) 88 MCG tablet TAKE 1 TABLET BY MOUTH EVERY DAY 08/31/17  Yes Wendie Agreste, MD  lisinopril (PRINIVIL,ZESTRIL) 20 MG tablet TAKE 1 TABLET (20 MG TOTAL) BY MOUTH DAILY. 07/24/16  Yes Leonie Man, MD  metoprolol succinate (TOPROL-XL) 25 MG 24 hr tablet TAKE 1 TABLET (25 MG TOTAL) BY MOUTH DAILY. 09/18/17  Yes Leonie Man, MD  simvastatin (ZOCOR) 40 MG tablet Take 40 mg by mouth at bedtime.    Yes [provider]  testosterone cypionate (DEPO-TESTOSTERONE) 200 MG/ML injection Inject 1.5 mLs (300 mg total) into the muscle every 21 ( twenty-one) days. 11/23/14 03/14/15  Elayne Snare, MD   Social History   Socioeconomic History  . Marital status: Married    Spouse name: Not on file  . Number of children: Not on file  . Years of education: Not on file  . Highest education  level: Not on file  Occupational History  . Not on file  Social Needs  . Financial resource strain: Not on file  . Food insecurity:    Worry: Not on file    Inability: Not on file  . Transportation needs:    Medical: Not on file    Non-medical: Not on file  Tobacco Use  . Smoking status: Former Smoker    Last attempt to quit: 06/24/1993    Years since quitting: 24.4  . Smokeless tobacco: Never Used  Substance and Sexual Activity  . Alcohol use: No  . Drug use: No  . 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 Physical activity:    Days per week: Not on file    Minutes per session: Not on file  . Stress: Not on file  Relationships  . Social connections:    Talks on phone: Not on file    Gets together: Not on file    Attends religious service: Not on file    Active member of club or organization: Not on file    Attends meetings of clubs or organizations: Not on file    Relationship status: Not on file  . Intimate partner violence:    Fear of current or ex partner: Not on file    Emotionally abused: Not on file    Physically abused: Not on file    Forced sexual activity: Not on file  Other Topics Concern  . Not on file  Social History Narrative   He is a married father of 2, grandfather 51. Exercises only occasionally. Not as much as he desires 2. 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   He does not smoke cigarettes -- quit in 1995. Does not drink alcohol.   Review of Systems 13 point ROS - negative     Objective:   Physical Exam  Constitutional: He is oriented to person, place, and time. He appears well-developed and well-nourished.  HENT:  Head: Normocephalic and atraumatic.  Right Ear: External ear normal.  Left Ear: External ear normal.  Mouth/Throat: Oropharynx is clear and moist.  Eyes: Pupils are equal, round, and reactive to light. Conjunctivae and EOM are normal.  Neck: Normal range of  motion.  Neck supple. No thyromegaly present.  Cardiovascular: Normal rate, regular rhythm, normal heart sounds and intact distal pulses.  Pulmonary/Chest: Effort normal and breath sounds normal. No respiratory distress. He has no wheezes.  Abdominal: Soft. He exhibits no distension. There is no tenderness.  Musculoskeletal: Normal range of motion. He exhibits no edema or tenderness.  Lymphadenopathy:    He has no cervical adenopathy.  Neurological: He is alert and oriented to person, place, and time. He has normal reflexes.  Skin: Skin is warm and dry.  Psychiatric: He has a normal mood and affect. His behavior is normal.  Vitals reviewed.   Vitals:   11/16/17 0806 11/16/17 0823  BP: (!) 156/78 136/70  Pulse: 64   Temp: 98 F (36.7 C)   TempSrc: Oral   SpO2: 98%   Weight: 234 lb 12.8 oz (106.5 kg)   Height: 5' 11"  (1.803 m)        Assessment & Plan:   Dragan Tamburrino is a 72 y.o. male Medicare annual wellness visit, subsequent  -  - anticipatory guidance as below in AVS, screening labs if needed. Health maintenance items as above in HPI discussed/recommended as applicable.   - no concerning responses on depression, fall, or functional status screening. Any positive responses noted as above. Advanced directives discussed as in CH[facility].   -Recommend he follow-up as planned for colonoscopy, also recommended ophthalmology follow-up as scheduled.  Recommended dental evaluation.  Option of audiologist if difficulty hearing, declined at this time.  He also declined hepatitis C testing as well as prostate cancer screening.  Hyperlipidemia, unspecified hyperlipidemia type - Plan: Lipid panel, Hepatic Function Panel  -Check lipid panel, and per notes from cardiology plan on addition of Zetia if persistent LDL elevation.  Recommend continue to work on diet and exercise/activity.  Continue follow-up with endocrinologist regarding panhypopituitarism.  Continue same dose of hydrocortisone,  levothyroxine, Janumet.. Declines testosterone supplementation.  No orders of the defined types were placed in this encounter.  Patient Instructions   Keep follow up with your eye specialist as planned.  If you would like to meet with an audiologist to see if hearing aids would help, let me know.  Please follow up with me to discuss your knee pain further if needed. Tylenol on occasion if needed.  Keep follow up as planned for colonoscopy.  Thank you for coming in today.  I do recommend meeting with a dentist.   Preventive Care 78 Years and Older, Male Preventive care refers to lifestyle choices and visits with your health care provider that can promote health and wellness. What does preventive care include?  A yearly physical exam. This is also called an annual well check.  Dental exams once or twice a year.  Routine eye exams. Ask your health care provider how often you should have your eyes checked.  Personal lifestyle choices, including: ? Daily care of your teeth and gums. ? Regular physical activity. ? Eating a healthy diet. ? Avoiding tobacco and drug use. ? Limiting alcohol use. ? Practicing safe sex. ? Taking low doses of aspirin every day. ? Taking vitamin and mineral supplements as recommended by your health care provider. What happens during an annual well check? The services and screenings done by your health care provider during your annual well check will depend on your age, overall health, lifestyle risk factors, and family history of disease. Counseling Your health care provider may ask you questions about your:  Alcohol use.  Tobacco use.  Drug use.  Emotional well-being.  Home and relationship well-being.  Sexual activity.  Eating habits.  History of falls.  Memory and ability to understand (cognition).  Work and work Statistician.  Screening You may have the following tests or measurements:  Height, weight, and BMI.  Gutierrez  pressure.  Lipid and cholesterol levels. These may be checked every 5 years, or more frequently if you are over 35 years old.  Skin check.  Lung cancer screening. You may have this screening every year starting at age 1 if you have a 30-pack-year history of smoking and currently smoke or have quit within the past 15 years.  Fecal occult Gutierrez test (FOBT) of the stool. You may have this test every year starting at age 21.  Flexible sigmoidoscopy or colonoscopy. You may have a sigmoidoscopy every 5 years or a colonoscopy every 10 years starting at age 78.  Prostate cancer screening. Recommendations will vary depending on your family history and other risks.  Hepatitis C Gutierrez test.  Hepatitis B Gutierrez test.  Sexually transmitted disease (STD) testing.  Diabetes screening. This is done by checking your Gutierrez sugar (glucose) after you have not eaten for a while (fasting). You may have this done every 1-3 years.  Abdominal aortic aneurysm (AAA) screening. You may need this if you are a current or former smoker.  Osteoporosis. You may be screened starting at age 19 if you are at high risk.  Talk with your health care provider about your test results, treatment options, and if necessary, the need for more tests. Vaccines Your health care provider may recommend certain vaccines, such as:  Influenza vaccine. This is recommended every year.  Tetanus, diphtheria, and acellular pertussis (Tdap, Td) vaccine. You may need a Td booster every 10 years.  Varicella vaccine. You may need this if you have not been vaccinated.  Zoster vaccine. You may need this after age 80.  Measles, mumps, and rubella (MMR) vaccine. You may need at least one dose of MMR if you were born in 1957 or later. You may also need a second dose.  Pneumococcal 13-valent conjugate (PCV13) vaccine. One dose is recommended after age 68.  Pneumococcal polysaccharide (PPSV23) vaccine. One dose is recommended after age  56.  Meningococcal vaccine. You may need this if you have certain conditions.  Hepatitis A vaccine. You may need this if you have certain conditions or if you travel or work in places where you may be exposed to hepatitis A.  Hepatitis B vaccine. You may need this if you have certain conditions or if you travel or work in places where you may be exposed to hepatitis B.  Haemophilus influenzae type b (Hib) vaccine. You may need this if you have certain risk factors.  Talk to your health care provider about which screenings and vaccines you need and how often you need them. This information is not intended to replace advice given to you by your health care provider. Make sure you discuss any questions you have with your health care provider. Document Released: 06/29/2015 Document Revised: 02/20/2016 Document Reviewed: 04/03/2015 Elsevier Interactive Patient Education  2018 [employer].     IF you received an x-ray today, you will receive an invoice from [facility]. Please contact [facility] at [phone] with questions or concerns regarding your invoice.   IF you received labwork today, you will receive an invoice from [facility]. Please contact [facility] at [phone] with questions or concerns regarding your invoice.   Our billing staff will not be  able to assist you with questions regarding bills from these companies.  You will be contacted with the lab results as soon as they are available. The fastest way to get your results is to activate your My Chart account. Instructions are located on the last page of this paperwork. If you have not heard from [place] regarding the results in 2 weeks, please contact this office.       I personally performed the services described in this documentation, which was scribed in my presence. The recorded information has been reviewed and considered for accuracy and completeness, addended by me as needed, and agree with information  above.  Signed,   [name], MD [facility].  11/16/17 8:47 AM

## 2017-11-17 LAB — HEPATIC FUNCTION PANEL
ALT: 19 IU/L (ref 0–44)
AST: 18 IU/L (ref 0–40)
Albumin: 4.5 g/dL (ref 3.5–4.8)
Alkaline Phosphatase: 78 IU/L (ref 39–117)
Bilirubin Total: 0.3 mg/dL (ref 0.0–1.2)
Bilirubin, Direct: 0.1 mg/dL (ref 0.00–0.40)
Total Protein: 7.2 g/dL (ref 6.0–8.5)

## 2017-11-17 LAB — LIPID PANEL
Chol/HDL Ratio: 3.8 ratio (ref 0.0–5.0)
Cholesterol, Total: 143 mg/dL (ref 100–199)
HDL: 38 mg/dL — ABNORMAL LOW (ref >39)
LDL Calculated: 77 mg/dL (ref 0–99)
Triglycerides: 138 mg/dL (ref 0–149)
VLDL Cholesterol Cal: 28 mg/dL (ref 5–40)

## 2017-11-19 DIAGNOSIS — N400 Enlarged prostate without lower urinary tract symptoms: Secondary | ICD-10-CM | POA: Diagnosis not present

## 2017-11-23 ENCOUNTER — Encounter: Payer: Self-pay | Admitting: *Deleted

## 2017-11-27 ENCOUNTER — Other Ambulatory Visit: Payer: Self-pay | Admitting: Family Medicine

## 2017-11-27 DIAGNOSIS — E039 Hypothyroidism, unspecified: Secondary | ICD-10-CM

## 2017-12-22 ENCOUNTER — Other Ambulatory Visit: Payer: Self-pay | Admitting: Endocrinology

## 2018-01-29 ENCOUNTER — Other Ambulatory Visit (INDEPENDENT_AMBULATORY_CARE_PROVIDER_SITE_OTHER): Payer: Medicare Other

## 2018-01-29 DIAGNOSIS — E230 Hypopituitarism: Secondary | ICD-10-CM

## 2018-01-29 DIAGNOSIS — E1165 Type 2 diabetes mellitus with hyperglycemia: Secondary | ICD-10-CM | POA: Diagnosis not present

## 2018-01-29 LAB — COMPREHENSIVE METABOLIC PANEL
ALT: 19 U/L (ref 0–53)
AST: 15 U/L (ref 0–37)
Albumin: 4.3 g/dL (ref 3.5–5.2)
Alkaline Phosphatase: 60 U/L (ref 39–117)
BUN: 21 mg/dL (ref 6–23)
CO2: 27 mEq/L (ref 19–32)
Calcium: 9.6 mg/dL (ref 8.4–10.5)
Chloride: 106 mEq/L (ref 96–112)
Creatinine, Ser: 1.44 mg/dL (ref 0.40–1.50)
GFR: 61.64 mL/min (ref >60.00)
Glucose, Bld: 136 mg/dL — ABNORMAL HIGH (ref 70–99)
Potassium: 4.3 mEq/L (ref 3.5–5.1)
Sodium: 141 mEq/L (ref 135–145)
Total Bilirubin: 0.4 mg/dL (ref 0.2–1.2)
Total Protein: 7.3 g/dL (ref 6.0–8.3)

## 2018-01-29 LAB — HEMOGLOBIN A1C: Hgb A1c MFr Bld: 7 % — ABNORMAL HIGH (ref 4.6–6.5)

## 2018-01-29 LAB — T4, FREE: Free T4: 1.08 ng/dL (ref 0.60–1.60)

## 2018-01-29 LAB — TSH: TSH: 4.09 u[IU]/mL (ref 0.35–4.50)

## 2018-01-29 LAB — TESTOSTERONE: Testosterone: 54.79 ng/dL — ABNORMAL LOW (ref 300.00–890.00)

## 2018-02-02 ENCOUNTER — Ambulatory Visit (INDEPENDENT_AMBULATORY_CARE_PROVIDER_SITE_OTHER): Payer: Medicare Other | Admitting: Endocrinology

## 2018-02-02 ENCOUNTER — Encounter: Payer: Self-pay | Admitting: Endocrinology

## 2018-02-02 VITALS — BP 122/72 | HR 55 | Wt 234.0 lb

## 2018-02-02 DIAGNOSIS — I2510 Atherosclerotic heart disease of native coronary artery without angina pectoris: Secondary | ICD-10-CM | POA: Diagnosis not present

## 2018-02-02 DIAGNOSIS — E230 Hypopituitarism: Secondary | ICD-10-CM | POA: Diagnosis not present

## 2018-02-02 DIAGNOSIS — E1165 Type 2 diabetes mellitus with hyperglycemia: Secondary | ICD-10-CM

## 2018-02-02 DIAGNOSIS — I10 Essential (primary) hypertension: Secondary | ICD-10-CM

## 2018-02-02 DIAGNOSIS — Z9861 Coronary angioplasty status: Secondary | ICD-10-CM | POA: Diagnosis not present

## 2018-02-02 MED ORDER — SITAGLIP PHOS-METFORMIN HCL ER 50-1000 MG PO TB24: 1.0000 | ORAL_TABLET | Freq: Two times a day (BID) | ORAL | 3 refills | Status: DC

## 2018-02-02 NOTE — Progress Notes (Signed)
Carl Gutierrez is a 72 y.o. male.            Chief complaint: Followup for endocrinology problems including diabetes  History of Present Illness:  HYPOPITUITARISM: This is secondary to his pituitary tumor which was treated surgically in 07/2010 He has been asymptomatic with his multiple hormone deficiencies except at the time of diagnosis.  History of hypogonadism at baseline testosterone levels of about 18.  Initially had decreased libido, some fatigue and also erectile dysfunction His did not get adequate levels with AndroGel because of poor compliance.  Marland Kitchen  He was switched to testosterone injections and had variable compliance with getting his injections Records show that his last injection was in January 2017  Subsequently the patient had refused to take any treatment after his episode of DVT in 3/17  He again denies any lethargy, fatigability, low energy Testosterone level has been consistently low, he still does not want to consider replacement therapy  Lab Results  Component Value Date   TESTOSTERONE 54.79 (L) 01/29/2018    HYPOTHYROIDISM, secondary:  He has secondary hypothyroidism treated with 88 mcg Levothyroxine supplement daily.    His dose has been the same since about 2015   No complaints of fatigue recently He has taking levothyroxine consistently for morning  Free T4 level is normal and the same  Lab Results  Component Value Date   FREET4 1.08 01/29/2018   FREET4 1.03 06/25/2017   FREET4 1.05 12/26/2016   FREET4 1.10 09/19/2016    ADRENAL insufficiency:  He has been on long-term corticosteroid replacement with hydrocortisone 20 mg in the morning and 10 mg the early evening He takes this very regularly as directed May occasionally feel lightheadedness but not like a faint feeling No weakness fatigue or nausea  Has not had growth hormone deficiency when previously tested   DIABETES: See review of systems   Allergies as of 02/02/2018      Reactions     Hydrochlorothiazide    Leg and side pain with this   Other    Blood pressure med name unknown.      Medication List        Accurate as of 02/02/18  9:22 AM. Always use your most recent med list.          amLODipine 10 MG tablet Commonly known as:  NORVASC TAKE 1 TABLET (10 MG TOTAL) BY MOUTH DAILY.   aspirin 81 MG tablet Take 81 mg by mouth daily. Reported on 09/05/2015   hydrocortisone 20 MG tablet Commonly known as:  CORTEF Take 10-20 mg by mouth 2 (two) times daily. Takes 1 tablet in the morning and 1/2 tablet at night   levothyroxine 88 MCG tablet Commonly known as:  SYNTHROID, LEVOTHROID TAKE 1 TABLET BY MOUTH EVERY DAY   lisinopril 20 MG tablet Commonly known as:  PRINIVIL,ZESTRIL TAKE 1 TABLET (20 MG TOTAL) BY MOUTH DAILY.   metoprolol succinate 25 MG 24 hr tablet Commonly known as:  TOPROL-XL TAKE 1 TABLET (25 MG TOTAL) BY MOUTH DAILY.   simvastatin 40 MG tablet Commonly known as:  ZOCOR Take 40 mg by mouth at bedtime.   SitaGLIPtin-MetFORMIN HCl 50-1000 MG Tb24 Take 1 tablet by mouth 2 (two) times daily.       Allergies:  Allergies  Allergen Reactions  . Hydrochlorothiazide     Leg and side pain with this  . Other     Blood pressure med name unknown.    Past Medical History:  Diagnosis Date  .  CAD S/P percutaneous coronary angioplasty February 2004; 2008   PCI-dLAD: 2.25 mm x 16 mm Express BMS; Ramus- 2.75 mm x 18 mm Cypher DES (2.8 mm); follow-up 11/'08: Patent LAD/ramus stents. 90% small OM. EF 50-60%.; Myoview 1/'12: Exercise 9 min, 10 METS with no ischemia/infarction.   . Clotting disorder ([place])   . Diabetes mellitus    Low-dose metformin  . DJD (degenerative joint disease), lumbosacral     status post L4-L5 surgery  . Essential hypertension   . History of DVT of lower extremity     postoperative  . Hyperlipidemia with target LDL less than 70   . Hypothyroidism    Synthroid  . Panhypopituitarism ([place])    Status post pituitary  adenoma removal in 2012  . Recurrent deep vein thrombosis (DVT) ([place]) 10/01/2015    Past Surgical History:  Procedure Laterality Date  . BRAIN SURGERY  2012   Removal of pituitary adenoma  . CARDIAC CATHETERIZATION  November 2008   Significant LAD tapering but no significant disease the distal stent. Patent Ramus stent. 90% lesion in small OM 2; small nondominant RCA. Medical therapy. EF 50-60%.  . CORONARY ANGIOPLASTY WITH STENT PLACEMENT  February 2004   PCI-dLAD: 2.25 mm x 16 mm BMS;;PCI- RI: 2.75 mm 18 mm Cypher DES  . NM MYOVIEW LTD  January 2012   Exercise ~9 min - 10 METS; no ischemia or infarction  . PROSTATECTOMY N/A 08/13/2015   Procedure: TRANSVESICLE OPEN PROSTATECTOMY** ;  Surgeon: Carolan Clines, MD;  Location: [facility];  Service: Urology;  Laterality: N/A;  . SPINE SURGERY     L4-L5    Family History  Problem Relation Age of Onset  . Cancer Mother        pancreatic ca    Social History:  reports that he quit smoking about 24 years ago. He has never used smokeless tobacco. He reports that he does not drink alcohol or use drugs.  Review of Systems    DIABETES Type II:  He has had mild diabetes for several years and previously treated by his primary care physician with low-dose metformin only  When his A1c was up to 7.4 in 9/15 he was told to increase his metformin to 3 tablets a day  Recent history:  Current oral hypoglycemic therapy: Janumet XR 100/1000 daily  He does not want to check his home blood sugars   His A1c is still staying relatively high at 7, previously 7.1  Current management and problems identified:  Because of his high A1c he was advised to work harder on getting his weight down with more consistent diet and regular exercise  Although he thinks he is trying to walk up to 15 minutes a day is a relatively slow.  Also was recommended starting exercise at the [facility] but he does not want to do this  Also does not want to check his blood sugars  and has not done any  Weight is about the same  He is concerned about the cost of Janumet   He was seen by the dietitian in 6/14 and was instructed on cut back on high fat foods, to eat balanced breakfast    Lab fasting glucose was 136   Wt Readings from Last 3 Encounters:  02/02/18 234 lb (106.1 kg)  11/16/17 234 lb 12.8 oz (106.5 kg)  10/30/17 236 lb 3.2 oz (107.1 kg)    Lab Results  Component Value Date   HGBA1C 7.0 (H) 01/29/2018   HGBA1C 7.1 (  H) 10/27/2017   HGBA1C 6.8 (H) 06/25/2017   Lab Results  Component Value Date   MICROALBUR 1.2 06/25/2017   LDLCALC 77 11/16/2017   CREATININE 1.44 01/29/2018    HYPERCHOLESTEROLEMIA: Treated with simvastatin 40 mg and followed by PCP LDL below 100  Lab Results  Component Value Date   CHOL 143 11/16/2017   HDL 38 (L) 11/16/2017   LDLCALC 77 11/16/2017   LDLDIRECT 85.0 05/18/2014   TRIG 138 11/16/2017   CHOLHDL 3.8 11/16/2017    HYPERTENSION: Treated with amlodipine 10 mg and lisinopril by cardiologist Blood pressure initially high when he came in  He says he checks his blood pressure occasionally at home and at times may be higher also   BP Readings from Last 3 Encounters:  02/02/18 122/72  11/16/17 136/70  10/30/17 136/72    EXAM:  BP 122/72 (Cuff Size: Large)   Pulse (!) 55   Wt 234 lb (106.1 kg)   SpO2 99%   BMI 32.64 kg/m     Diabetic Foot Exam - Simple   Simple Foot Form Diabetic Foot exam was performed with the following findings:  Yes   Visual Inspection No deformities, no ulcerations, no other skin breakdown bilaterally:  Yes Sensation Testing Intact to touch and monofilament testing bilaterally:  Yes Pulse Check Posterior Tibialis and Dorsalis pulse intact bilaterally:  Yes Comments      Assessment/Plan:   PANHYPOPITUITARISM secondary to pituitary tumor: Persistent hormone defects requiring replacement with testosterone, levothyroxine and cortisone.    Secondary hypothyroidism:    Subjectively doing well His free T4 is consistently normal and he will continue 88 g   Hypogonadism: He has  been reporting minimal symptoms even with  very low testosterone levels.   No complaints of fatigue but he probably does have some lack of motivation Does have erectile dysfunction for other reasons  Again he still refuses to consider testosterone supplementation despite his level being only 89 Discussed that if he does start having more fatigue will consider AndroGel  Secondary adrenal insufficiency: Taking hydrocortisone supplementation and will continue physiological doses unchanged Reminded him to take those consistently at the prescribed times  DIABETES:  See history of present illness for detailed discussion of current diabetes management, blood sugar patterns and problems identified  He  previously had improved his diet and also with using Janumet XR his A1c was down to 6.3 in 2018  A1c is still relatively high at 7% He usually refuses to consider additional medications or increasing his doses and on the previous visit had wanted to try diet and exercise further but he has not really made any changes  Discussed that we should increase his Janumet to maximum doses, currently getting only 1000 mg of metformin ER He says he will do better with walking more regularly and more frequently Also needs to cut back on portions and carbohydrates He still does not want to see the dietitian Reminded him to check his blood sugars periodically also and discussed blood sugar targets Discussed A1c targets of under 6.5 which he had previously May benefit from going to the [facility] for nonweightbearing exercises  Recommended follow-up in 3 to 4 months but he wants to wait till January  He will come back in October for his flu shot  LIPIDS: Followed by Dr. Carlota Raspberry  Total visit time for evaluation and management of multiple problems and counseling =25 minutes   Duanne Duchesne 02/02/2018, 9:22 AM

## 2018-02-22 ENCOUNTER — Other Ambulatory Visit: Payer: Self-pay | Admitting: Family Medicine

## 2018-02-22 DIAGNOSIS — E039 Hypothyroidism, unspecified: Secondary | ICD-10-CM

## 2018-06-02 ENCOUNTER — Telehealth: Payer: Self-pay | Admitting: Endocrinology

## 2018-06-02 ENCOUNTER — Other Ambulatory Visit: Payer: Self-pay

## 2018-06-02 MED ORDER — SITAGLIP PHOS-METFORMIN HCL ER 100-1000 MG PO TB24: 1.0000 | ORAL_TABLET | Freq: Every day | ORAL | 3 refills | Status: DC

## 2018-06-02 NOTE — Telephone Encounter (Signed)
Patient states that since patient began taking Janumet he has been experiencing severe diarrhea. Please call patient at ph#  [phone] to advise.

## 2018-06-02 NOTE — Telephone Encounter (Signed)
Any advice?

## 2018-06-02 NOTE — Telephone Encounter (Signed)
Called pt and advised him of medication change. Pt agreed to try different dose. Rx sent.

## 2018-06-02 NOTE — Telephone Encounter (Signed)
We can change him to Janumet XR 100/1000 only 1 tablet daily instead of 2 tablets daily

## 2018-06-17 ENCOUNTER — Other Ambulatory Visit (INDEPENDENT_AMBULATORY_CARE_PROVIDER_SITE_OTHER): Payer: Medicare Other

## 2018-06-17 DIAGNOSIS — E230 Hypopituitarism: Secondary | ICD-10-CM | POA: Diagnosis not present

## 2018-06-17 DIAGNOSIS — E1165 Type 2 diabetes mellitus with hyperglycemia: Secondary | ICD-10-CM

## 2018-06-17 LAB — MICROALBUMIN / CREATININE URINE RATIO
Creatinine,U: 142.3 mg/dL
Microalb Creat Ratio: 1.5 mg/g (ref 0.0–30.0)
Microalb, Ur: 2.1 mg/dL — ABNORMAL HIGH (ref 0.0–1.9)

## 2018-06-17 LAB — COMPREHENSIVE METABOLIC PANEL
ALT: 18 U/L (ref 0–53)
AST: 14 U/L (ref 0–37)
Albumin: 4.1 g/dL (ref 3.5–5.2)
Alkaline Phosphatase: 61 U/L (ref 39–117)
BUN: 18 mg/dL (ref 6–23)
CO2: 26 mEq/L (ref 19–32)
Calcium: 9.1 mg/dL (ref 8.4–10.5)
Chloride: 107 mEq/L (ref 96–112)
Creatinine, Ser: 1.29 mg/dL (ref 0.40–1.50)
GFR: 69.91 mL/min (ref >60.00)
Glucose, Bld: 123 mg/dL — ABNORMAL HIGH (ref 70–99)
Potassium: 4.2 mEq/L (ref 3.5–5.1)
Sodium: 141 mEq/L (ref 135–145)
Total Bilirubin: 0.4 mg/dL (ref 0.2–1.2)
Total Protein: 6.9 g/dL (ref 6.0–8.3)

## 2018-06-17 LAB — T4, FREE: Free T4: 1.17 ng/dL (ref 0.60–1.60)

## 2018-06-17 LAB — HEMOGLOBIN A1C: Hgb A1c MFr Bld: 6.4 % (ref 4.6–6.5)

## 2018-06-23 ENCOUNTER — Other Ambulatory Visit: Payer: Self-pay | Admitting: Endocrinology

## 2018-06-25 ENCOUNTER — Other Ambulatory Visit: Payer: Medicare Other

## 2018-06-27 NOTE — Progress Notes (Signed)
Carl Gutierrez is a 73 y.o. male.            Chief complaint: Followup for endocrinology problems including diabetes  History of Present Illness:  HYPOPITUITARISM: This is secondary to his pituitary tumor which was treated surgically in 07/2010 He has been asymptomatic with his multiple hormone deficiencies except at the time of diagnosis.  History of hypogonadism at baseline testosterone levels of about 18.  Initially had decreased libido, some fatigue and also erectile dysfunction His did not get adequate levels with AndroGel because of poor compliance.  Marland Kitchen  He was switched to testosterone injections and had variable compliance with getting his injections Records show that his last injection was in January 2017  Subsequently the patient had refused to take any treatment after his episode of DVT in 3/17  Testosterone level has been consistently low, he does not want to consider replacement therapy No recent complaints of fatigue  Lab Results  Component Value Date   TESTOSTERONE 54.79 (L) 01/29/2018    HYPOTHYROIDISM, secondary:  He has secondary hypothyroidism treated with 88 mcg Levothyroxine supplement daily.    His dose has been the same since about 2015   He feels fairly good recently with his energy level He has taking levothyroxine consistently before breakfast in the morning  Free T4 level is normal and stable  Lab Results  Component Value Date   FREET4 1.17 06/17/2018   FREET4 1.08 01/29/2018   FREET4 1.03 06/25/2017   FREET4 1.05 12/26/2016    ADRENAL insufficiency:  He has been on long-term corticosteroid replacement with hydrocortisone 20 mg in the morning and 10 mg the early evening He takes this very regularly as directed  No recent weakness, lightheadedness or nausea  Has not had growth hormone deficiency when previously evaluated   DIABETES: See review of systems   Allergies as of 06/28/2018      Reactions   Hydrochlorothiazide    Leg and side  pain with this   Other    Blood pressure med name unknown.      Medication List       Accurate as of June 28, 2018  8:14 AM. Always use your most recent med list.        amLODipine 10 MG tablet Commonly known as:  NORVASC TAKE 1 TABLET (10 MG TOTAL) BY MOUTH DAILY.   aspirin 81 MG tablet Take 81 mg by mouth daily. Reported on 09/05/2015   hydrocortisone 20 MG tablet Commonly known as:  CORTEF Take 10-20 mg by mouth 2 (two) times daily. Takes 1 tablet in the morning and 1/2 tablet at night   levothyroxine 88 MCG tablet Commonly known as:  SYNTHROID, LEVOTHROID TAKE 1 TABLET BY MOUTH EVERY DAY   lisinopril 20 MG tablet Commonly known as:  PRINIVIL,ZESTRIL TAKE 1 TABLET (20 MG TOTAL) BY MOUTH DAILY.   metoprolol succinate 25 MG 24 hr tablet Commonly known as:  TOPROL-XL TAKE 1 TABLET (25 MG TOTAL) BY MOUTH DAILY.   simvastatin 40 MG tablet Commonly known as:  ZOCOR Take 40 mg by mouth at bedtime.   JANUMET XR [phone] MG Tb24 Generic drug:  SitaGLIPtin-MetFORMIN HCl Take 1 tablet by mouth daily. TAKE 1 TABLET BY MOUTH ONCE DAILY   SitaGLIPtin-MetFORMIN HCl [phone] MG Tb24 Commonly known as:  JANUMET XR Take 1 tablet by mouth daily. Take 1 tablet by mouth once daily.       Allergies:  Allergies  Allergen Reactions  . Hydrochlorothiazide  Leg and side pain with this  . Other     Blood pressure med name unknown.    Past Medical History:  Diagnosis Date  . CAD S/P percutaneous coronary angioplasty February 2004; 2008   PCI-dLAD: 2.25 mm x 16 mm Express BMS; Ramus- 2.75 mm x 18 mm Cypher DES (2.8 mm); follow-up 11/'08: Patent LAD/ramus stents. 90% small OM. EF 50-60%.; Myoview 1/'12: Exercise 9 min, 10 METS with no ischemia/infarction.   . Clotting disorder ([place])   . Diabetes mellitus    Low-dose metformin  . DJD (degenerative joint disease), lumbosacral     status post L4-L5 surgery  . Essential hypertension   . History of DVT of lower extremity      postoperative  . Hyperlipidemia with target LDL less than 70   . Hypothyroidism    Synthroid  . Panhypopituitarism ([place])    Status post pituitary adenoma removal in 2012  . Recurrent deep vein thrombosis (DVT) ([place]) 10/01/2015    Past Surgical History:  Procedure Laterality Date  . BRAIN SURGERY  2012   Removal of pituitary adenoma  . CARDIAC CATHETERIZATION  November 2008   Significant LAD tapering but no significant disease the distal stent. Patent Ramus stent. 90% lesion in small OM 2; small nondominant RCA. Medical therapy. EF 50-60%.  . CORONARY ANGIOPLASTY WITH STENT PLACEMENT  February 2004   PCI-dLAD: 2.25 mm x 16 mm BMS;;PCI- RI: 2.75 mm 18 mm Cypher DES  . NM MYOVIEW LTD  January 2012   Exercise ~9 min - 10 METS; no ischemia or infarction  . PROSTATECTOMY N/A 08/13/2015   Procedure: TRANSVESICLE OPEN PROSTATECTOMY** ;  Surgeon: Carolan Clines, MD;  Location: [facility];  Service: Urology;  Laterality: N/A;  . SPINE SURGERY     L4-L5    Family History  Problem Relation Age of Onset  . Cancer Mother        pancreatic ca    Social History:  reports that he quit smoking about 25 years ago. He has never used smokeless tobacco. He reports that he does not drink alcohol or use drugs.  Review of Systems    DIABETES Type II:  He has had mild diabetes for several years and previously treated by his primary care physician with low-dose metformin only  When his A1c was up to 7.4 in 9/15 he was told to increase his metformin to 3 tablets a day  Recent history:  Current oral hypoglycemic therapy: Janumet XR 100/1000 daily  His A1c is much better at 2.4 compared to 7.1 and 7% last year   Current management and problems identified:  Although there was some discussion about the increase in dosage required for his Janumet on his last visit this was not actually recommended and he was told to improve his diet and lose weight  However he apparently was taking Janumet twice a  day and started having diarrhea  He did not call about this and continued to have periodic postprandial diarrhea with urgency after most meals  In the last month he is back to taking 1 tablet daily of the Janumet 100/1000  He has not been motivated to exercise  He is now interested in checking his blood sugar  Weight is up 3 pounds   He was seen by the dietitian in 6/14 and was instructed on cut back on high fat foods, to eat balanced breakfast    Lab fasting glucose was 123   Wt Readings from Last 3 Encounters:  06/28/18 237 lb 12.8 oz (107.9 kg)  02/02/18 234 lb (106.1 kg)  11/16/17 234 lb 12.8 oz (106.5 kg)    Lab Results  Component Value Date   HGBA1C 6.4 06/17/2018   HGBA1C 7.0 (H) 01/29/2018   HGBA1C 7.1 (H) 10/27/2017   Lab Results  Component Value Date   MICROALBUR 2.1 (H) 06/17/2018   LDLCALC 77 11/16/2017   CREATININE 1.29 06/17/2018    HYPERCHOLESTEROLEMIA: Treated with simvastatin 40 mg and followed by PCP LDL below 100  Lab Results  Component Value Date   CHOL 143 11/16/2017   HDL 38 (L) 11/16/2017   LDLCALC 77 11/16/2017   LDLDIRECT 85.0 05/18/2014   TRIG 138 11/16/2017   CHOLHDL 3.8 11/16/2017    HYPERTENSION: Treated with amlodipine 10 mg and lisinopril by cardiologist   Check his blood pressure periodically at home also   BP Readings from Last 3 Encounters:  06/28/18 140/72  02/02/18 122/72  11/16/17 136/70    EXAM:  BP 140/72 (BP Location: Left Arm, Patient Position: Sitting, Cuff Size: Normal)   Pulse 81   Ht 5\' 11"  (1.803 m)   Wt 237 lb 12.8 oz (107.9 kg)   SpO2 97%   BMI 33.17 kg/m     Assessment/Plan:   PANHYPOPITUITARISM secondary to pituitary tumor: Persistent hormone defects requiring replacement with testosterone, levothyroxine and cortisone.    Secondary hypothyroidism:   No symptoms of unusual fatigue recently His free T4 is consistently normal and he will continue 88 g  Secondary adrenal  insufficiency: Taking hydrocortisone and usual replacement doses, doing well He is taking his medication regularly  DIABETES:  See above history for discussion of current diabetes management, blood sugar patterns and problems identified  A1c is improved at 6.4  As discussed above he on his own increase his Janumet to 2 tablets daily without a new prescription but was starting to get diarrhea last year However has not lost any weight Currently his blood sugars may be improving with his fasting reading 123 Not monitoring at home  He will stay with 1 tablet daily of the Janumet and start glucose monitoring He was shown by the diabetes educator about how to check his sugar and can alternate fasting and postprandial readings Follow-up in 3 months Encouraged him to join the [facility] for exercise program   Hypertension: Well-controlled   Elayne Snare 06/28/2018, 8:14 AM

## 2018-06-28 ENCOUNTER — Other Ambulatory Visit: Payer: Self-pay

## 2018-06-28 ENCOUNTER — Encounter: Payer: Self-pay | Admitting: Endocrinology

## 2018-06-28 ENCOUNTER — Ambulatory Visit (INDEPENDENT_AMBULATORY_CARE_PROVIDER_SITE_OTHER): Payer: Medicare Other | Admitting: Endocrinology

## 2018-06-28 VITALS — BP 140/72 | HR 81 | Ht 71.0 in | Wt 237.8 lb

## 2018-06-28 DIAGNOSIS — E1165 Type 2 diabetes mellitus with hyperglycemia: Secondary | ICD-10-CM

## 2018-06-28 DIAGNOSIS — E230 Hypopituitarism: Secondary | ICD-10-CM

## 2018-06-28 MED ORDER — SITAGLIP PHOS-METFORMIN HCL ER 100-1000 MG PO TB24: 1.0000 | ORAL_TABLET | Freq: Every day | ORAL | 3 refills | Status: DC

## 2018-06-28 NOTE — Patient Instructions (Signed)
Check blood sugars on waking up 2 days a week  Also check blood sugars about 2 hours after meals and do this after different meals by rotation  Recommended blood sugar levels on waking up are 90-130 and about 2 hours after meal is 130-160  Please bring your blood sugar monitor to each visit, thank you   

## 2018-07-05 ENCOUNTER — Telehealth: Payer: Self-pay | Admitting: Endocrinology

## 2018-07-05 NOTE — Telephone Encounter (Signed)
There is no documentation as to who attempted to contact patient.

## 2018-07-05 NOTE — Telephone Encounter (Signed)
Patient's wife is returning call to [facility] from Friday. Please Advise, thanks

## 2018-07-05 NOTE — Telephone Encounter (Signed)
Pt was not contacted by me.

## 2018-07-07 ENCOUNTER — Telehealth: Payer: Self-pay

## 2018-07-07 NOTE — Telephone Encounter (Signed)
Received fax from pt's pharmacy requesting test strips. Attempted to call pt and see what kind blood glucose meter the patient has in order to send in the appropriate test strips. Patient did not answer and was unable to leave a voicemail.

## 2018-08-02 ENCOUNTER — Ambulatory Visit (INDEPENDENT_AMBULATORY_CARE_PROVIDER_SITE_OTHER): Payer: Medicare Other | Admitting: Cardiology

## 2018-08-02 ENCOUNTER — Encounter: Payer: Self-pay | Admitting: Cardiology

## 2018-08-02 VITALS — BP 140/72 | HR 73 | Ht 71.0 in | Wt 237.4 lb

## 2018-08-02 DIAGNOSIS — I2510 Atherosclerotic heart disease of native coronary artery without angina pectoris: Secondary | ICD-10-CM

## 2018-08-02 DIAGNOSIS — E785 Hyperlipidemia, unspecified: Secondary | ICD-10-CM | POA: Diagnosis not present

## 2018-08-02 DIAGNOSIS — I10 Essential (primary) hypertension: Secondary | ICD-10-CM

## 2018-08-02 DIAGNOSIS — Z9861 Coronary angioplasty status: Secondary | ICD-10-CM

## 2018-08-02 MED ORDER — ROSUVASTATIN CALCIUM 40 MG PO TABS: 40.0000 mg | ORAL_TABLET | Freq: Every day | ORAL | 3 refills | Status: DC

## 2018-08-02 NOTE — Patient Instructions (Signed)
Medication Instructions:   FINISH TAKING SIMVASTATIN , THEN START TAKING ROSUVASTATIN 40 MG ONE TABLET DAILY.  If you need a refill on your cardiac medications before your next appointment, please call your pharmacy.   Lab work: NOT NEEDED If you have labs (blood work) drawn today and your tests are completely normal, you will receive your results only by: Marland Kitchen MyChart Message (if you have MyChart) OR . A paper copy in the mail If you have any lab test that is abnormal or we need to change your treatment, we will call you to review the results.  Testing/Procedures: NOT NEEDED  Follow-Up: At [facility], you and your health needs are our priority.  As part of our continuing mission to provide you with exceptional heart care, we have created designated Provider Care Teams.  These Care Teams include your primary Cardiologist (physician) and Advanced Practice Providers (APPs -  Physician Assistants and Nurse Practitioners) who all work together to provide you with the care you need, when you need it. You will need a follow up appointment in 12 months FEB 2021 --WHEN THEY CALL FOR YOUR [place].  Please call our office 2 months in advance to schedule this appointment.  You may see Glenetta Hew, MD or one of the following Advanced Practice Providers on your designated Care Team:   Rosaria Ferries, PA-C . Jory Sims, DNP, ANP  Any Other Special Instructions Will Be Listed Below (If Applicable).

## 2018-08-02 NOTE — Progress Notes (Signed)
PCP: Wendie Agreste, MD  Clinic Note: Chief Complaint  Patient presents with  . Follow-up    No major complaints  . Coronary Artery Disease    No angina  . Hypertension    Pressures go up and down  . Hyperlipidemia    Back on statin.  Seems to tolerate well    HPI: Carl Gutierrez is a 73 y.o. male with a PMH notable for CAD-PCI with CRFs who presents today for annual follow-up.  He also carries a diagnosis of panhypopituitarism.  Carl Gutierrez was last seen in February 2019.  Was doing well without any major complaints.  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  Had not yet gotten exercise regimen.  Was not as active as he had been before.  Relatively asymptomatic from cardiac standpoint.  Recent Hospitalizations: None  Studies Reviewed: No new studies  Interval History: Carl Gutierrez returns today overall doing fairly well.  He continues to try to walk a lot now.  But he has been limited because of the left knee and ankle pain.  He has gotten better with his exercise, getting into a routine with walking, but does indicate that he has to wheel himself to do it. No chest pain or pressure with rest or exertion.  No PND, orthopnea or edema just swelling in the left ankle and knee. No rapid irregular heartbeats, palpitations, tachycardia symptoms.  No TIA or amaurosis fugax symptoms.  No syncope/near syncope.    He seems to be tolerating simvastatin, indicating that his myalgias are better.  Walking is more limited by his knee pain and myalgias now.  No claudication.  ROS: A comprehensive was performed. Review of Systems  Constitutional: Negative for diaphoresis and weight loss (Weight is at least stable, but has not yet been on lose).  HENT: Negative for congestion and nosebleeds.   Respiratory: Negative for cough and wheezing.   Cardiovascular: Negative for chest pain and claudication.  Gastrointestinal: Negative for blood in stool, nausea and vomiting.    Genitourinary: Negative for dysuria, flank pain and hematuria.  Musculoskeletal: Positive for joint pain (Mostly left knee and ankle) and myalgias (Thighs just above the knees.  Left is worse than right).  Neurological: Negative for dizziness, focal weakness, seizures, loss of consciousness and weakness.  Endo/Heme/Allergies: Does not bruise/bleed easily.  Psychiatric/Behavioral: Negative for depression and memory loss. The patient is not nervous/anxious and does not have insomnia.   All other systems reviewed and are negative.   Past Medical History:  Diagnosis Date  . CAD S/P percutaneous coronary angioplasty February 2004; 2008   PCI-dLAD: 2.25 mm x 16 mm Express BMS; Ramus- 2.75 mm x 18 mm Cypher DES (2.8 mm); follow-up 11/'08: Patent LAD/ramus stents. 90% small OM. EF 50-60%.; Myoview 1/'12: Exercise 9 min, 10 METS with no ischemia/infarction.   . Clotting disorder ([place])   . Diabetes mellitus    Low-dose metformin  . DJD (degenerative joint disease), lumbosacral     status post L4-L5 surgery  . Essential hypertension   . History of DVT of lower extremity     postoperative  . Hyperlipidemia with target LDL less than 70   . Hypothyroidism    Synthroid  . Panhypopituitarism ([place])    Status post pituitary adenoma removal in 2012  . Recurrent deep vein thrombosis (DVT) ([place]) 10/01/2015    Past Surgical History:  Procedure Laterality Date  . BRAIN SURGERY  2012   Removal of pituitary adenoma  . CARDIAC CATHETERIZATION  November 2008   Significant LAD tapering but no significant disease the distal stent. Patent Ramus stent. 90% lesion in small OM 2; small nondominant RCA. Medical therapy. EF 50-60%.  . CORONARY ANGIOPLASTY WITH STENT PLACEMENT  February 2004   PCI-dLAD: 2.25 mm x 16 mm BMS;;PCI- RI: 2.75 mm 18 mm Cypher DES  . NM MYOVIEW LTD  January 2012   Exercise ~9 min - 10 METS; no ischemia or infarction  . PROSTATECTOMY N/A 08/13/2015   Procedure: TRANSVESICLE OPEN  PROSTATECTOMY** ;  Surgeon: Carolan Clines, MD;  Location: [facility];  Service: Urology;  Laterality: N/A;  . SPINE SURGERY     L4-L5   L leg DVT 2 yrs ago.   Current Meds  Medication Sig  . amLODipine (NORVASC) 10 MG tablet TAKE 1 TABLET (10 MG TOTAL) BY MOUTH DAILY.  Marland Kitchen aspirin 81 MG tablet Take 81 mg by mouth daily. Reported on 09/05/2015  . hydrocortisone (CORTEF) 20 MG tablet Take 10-20 mg by mouth 2 (two) times daily. Takes 1 tablet in the morning and 1/2 tablet at night  . levothyroxine (SYNTHROID, LEVOTHROID) 88 MCG tablet TAKE 1 TABLET BY MOUTH EVERY DAY  . lisinopril (PRINIVIL,ZESTRIL) 20 MG tablet TAKE 1 TABLET (20 MG TOTAL) BY MOUTH DAILY.  . metoprolol succinate (TOPROL-XL) 25 MG 24 hr tablet TAKE 1 TABLET (25 MG TOTAL) BY MOUTH DAILY.  Marland Kitchen SitaGLIPtin-MetFORMIN HCl (JANUMET XR) [phone] MG TB24 Take 1 tablet by mouth daily. Take 1 tablet by mouth once daily.  . [DISCONTINUED] simvastatin (ZOCOR) 40 MG tablet Take 40 mg by mouth at bedtime.      Allergies  Allergen Reactions  . Hydrochlorothiazide     Leg and side pain with this  . Other     Blood pressure med name unknown.    Social History   Tobacco Use  . Smoking status: Former Smoker    Last attempt to quit: 06/24/1993    Years since quitting: 25.1  . Smokeless tobacco: Never Used  Substance Use Topics  . Alcohol use: No  . Drug use: No   Social History   Social History Narrative   He is a married father of 2, grandfather 55. Exercises only occasionally. Not as much as he desires 2. 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   He does not smoke cigarettes -- quit in 1995. Does not drink alcohol.    Family History  Problem Relation Age of Onset  . Cancer Mother        pancreatic ca    Wt Readings from Last 3 Encounters:  08/02/18 237 lb 6.4 oz (107.7 kg)  06/28/18 237 lb 12.8 oz (107.9 kg)  02/02/18 234 lb (106.1 kg)    PHYSICAL EXAM BP 140/72   Pulse 73   Ht 5\' 11"  (1.803 m)   Wt 237 lb 6.4 oz (107.7  kg)   BMI 33.11 kg/m   Physical Exam  Constitutional: He is oriented to person, place, and time. He appears well-developed and well-nourished. No distress.  Healthy-appearing.  Well-groomed  HENT:  Head: Normocephalic and atraumatic.  Neck: Normal range of motion. Neck supple. No hepatojugular reflux and no JVD present. Carotid bruit is not present.  Cardiovascular: Normal rate, regular rhythm, normal heart sounds and intact distal pulses.  No extrasystoles are present. PMI is not displaced. Exam reveals no gallop and no friction rub.  No murmur heard. Pulmonary/Chest: Effort normal and breath sounds normal. No respiratory distress. He has no wheezes.  Abdominal: Soft.  Bowel sounds are normal. He exhibits no distension. There is no abdominal tenderness. There is no rebound.  Musculoskeletal: Normal range of motion.        General: No edema.  Neurological: He is alert and oriented to person, place, and time.  Skin: Skin is warm.  Psychiatric: He has a normal mood and affect. His behavior is normal. Judgment and thought content normal.  Nursing note and vitals reviewed.    Adult ECG Report n/a  Other studies Reviewed: Additional studies/ records that were reviewed today include:  Recent Labs:  Last checked in July 2018 - LDL 65 (while on statin) Lab Results  Component Value Date   CHOL 143 11/16/2017   HDL 38 (L) 11/16/2017   LDLCALC 77 11/16/2017   LDLDIRECT 85.0 05/18/2014   TRIG 138 11/16/2017   CHOLHDL 3.8 11/16/2017    ASSESSMENT / PLAN: Problem List Items Addressed This Visit    CAD S/P percutaneous coronary angioplasty - Primary (Chronic)    Distant history of two-vessel PCI back in 2004 in 2008.  Doing well with no recurrent anginal pains.  Is now back active and walking. Is on aspirin alone along with statin.  Is on stable dose of amlodipine, lisinopril and metoprolol. Is on 10 of Norvasc and 40 of Zocor/simvastatin which does raise concern for possible  interaction. --We will convert to rosuvastatin and continue other meds.      Relevant Medications   rosuvastatin (CRESTOR) 40 MG tablet   Other Relevant Orders   EKG 12-Lead (Completed)   Essential hypertension (Chronic)    Blood pressure is little high today.  Will monitor. If remains elevated, could consider changing metoprolol from Carvedilol       Relevant Medications   rosuvastatin (CRESTOR) 40 MG tablet   Other Relevant Orders   EKG 12-Lead (Completed)   Hyperlipidemia LDL goal <70 (Chronic)    LAL ~77 on Simvastatin --> Not quite at goal.  Would like more potent statin & would like to avoid interaction with amlodipine.  Plan: d/c simvastatin upon completion of current bottle.  Change to Rosuvastatin 40 mg.        Relevant Medications   rosuvastatin (CRESTOR) 40 MG tablet      Current medicines are reviewed at length with the patient today. (+/- concerns) he asked about simvastatin and concern with knee pain Patient Instructions  Medication Instructions:   FINISH TAKING SIMVASTATIN , THEN START TAKING ROSUVASTATIN 40 MG ONE TABLET DAILY.  If you need a refill on your cardiac medications before your next appointment, please call your pharmacy.   Lab work: NOT NEEDED If you have labs (blood work) drawn today and your tests are completely normal, you will receive your results only by: Marland Kitchen MyChart Message (if you have MyChart) OR . A paper copy in the mail If you have any lab test that is abnormal or we need to change your treatment, we will call you to review the results.  Testing/Procedures: NOT NEEDED  Follow-Up: At [facility], you and your health needs are our priority.  As part of our continuing mission to provide you with exceptional heart care, we have created designated Provider Care Teams.  These Care Teams include your primary Cardiologist (physician) and Advanced Practice Providers (APPs -  Physician Assistants and Nurse Practitioners) who all work  together to provide you with the care you need, when you need it. You will need a follow up appointment in 12 months FEB 2021 --[place]  YOUR WIFE'S APPOINTMENT SCHEDULE AT THAT TIME.  Please call our office 2 months in advance to schedule this appointment.  You may see Glenetta Hew, MD or one of the following Advanced Practice Providers on your designated Care Team:   Rosaria Ferries, PA-C . Jory Sims, DNP, ANP  Any Other Special Instructions Will Be Listed Below (If Applicable).     Studies Ordered:   Orders Placed This Encounter  Procedures  . EKG 12-Lead     Glenetta Hew, M.D., M.S. Interventional Cardiologist   Pager # [phone] Phone # [phone] [address]. [place], [place] [address]

## 2018-08-04 ENCOUNTER — Encounter: Payer: Self-pay | Admitting: Cardiology

## 2018-08-04 NOTE — Assessment & Plan Note (Addendum)
Blood pressure is little high today.  Will monitor. If remains elevated, could consider changing metoprolol from Carvedilol

## 2018-08-04 NOTE — Assessment & Plan Note (Signed)
Distant history of two-vessel PCI back in 2004 in 2008.  Doing well with no recurrent anginal pains.  Is now back active and walking. Is on aspirin alone along with statin.  Is on stable dose of amlodipine, lisinopril and metoprolol. Is on 10 of Norvasc and 40 of Zocor/simvastatin which does raise concern for possible interaction. --We will convert to rosuvastatin and continue other meds.

## 2018-08-04 NOTE — Assessment & Plan Note (Signed)
LAL ~77 on Simvastatin --> Not quite at goal.  Would like more potent statin & would like to avoid interaction with amlodipine.  Plan: d/c simvastatin upon completion of current bottle.  Change to Rosuvastatin 40 mg.

## 2018-08-21 ENCOUNTER — Other Ambulatory Visit: Payer: Self-pay | Admitting: Family Medicine

## 2018-08-21 DIAGNOSIS — E039 Hypothyroidism, unspecified: Secondary | ICD-10-CM

## 2018-08-23 NOTE — Telephone Encounter (Signed)
Requested Prescriptions  Pending Prescriptions Disp Refills  . levothyroxine (SYNTHROID, LEVOTHROID) 88 MCG tablet [Pharmacy Med Name: LEVOTHYROXINE 88 MCG TABLET] 90 tablet 0    Sig: TAKE 1 TABLET BY MOUTH EVERY DAY     Endocrinology:  Hypothyroid Agents Failed - 08/21/2018  8:55 AM      Failed - TSH needs to be rechecked within 3 months after an abnormal result. Refill until TSH is due.      Passed - TSH in normal range and within 360 days    TSH  Date Value Ref Range Status  01/29/2018 4.09 0.35 - 4.50 uIU/mL Final         Passed - Valid encounter within last 12 months    Recent Outpatient Visits          9 months ago Medicare annual wellness visit, subsequent   [facility], Ranell Patrick, MD   1 year ago Blister of right lower leg, initial encounter   [facility], Ranell Patrick, MD   1 year ago Medicare annual wellness visit, subsequent   [facility], Ranell Patrick, MD   2 years ago Type 2 diabetes mellitus without complication, without long-term current use of insulin [facility])   [facility], Ranell Patrick, MD   2 years ago Acute deep vein thrombosis (DVT) of proximal vein of both lower extremities [facility])   [facility], Ranell Patrick, MD      Future Appointments            In 2 months Carlota Raspberry Ranell Patrick, MD [facility] [place], [facility]

## 2018-09-08 ENCOUNTER — Other Ambulatory Visit: Payer: Self-pay | Admitting: Cardiology

## 2018-09-28 ENCOUNTER — Other Ambulatory Visit: Payer: Self-pay

## 2018-09-28 MED ORDER — GLUCOSE BLOOD VI STRP: ORAL_STRIP | 3 refills | Status: DC

## 2018-09-30 ENCOUNTER — Other Ambulatory Visit: Payer: Self-pay

## 2018-09-30 MED ORDER — GLUCOSE BLOOD VI STRP: ORAL_STRIP | 3 refills | Status: DC

## 2018-10-07 ENCOUNTER — Telehealth: Payer: Self-pay

## 2018-10-07 ENCOUNTER — Ambulatory Visit (INDEPENDENT_AMBULATORY_CARE_PROVIDER_SITE_OTHER): Payer: Medicare Other | Admitting: Endocrinology

## 2018-10-07 ENCOUNTER — Other Ambulatory Visit: Payer: Self-pay

## 2018-10-07 ENCOUNTER — Encounter: Payer: Self-pay | Admitting: Endocrinology

## 2018-10-07 VITALS — BP 110/60 | HR 74 | Ht 71.0 in | Wt 211.0 lb

## 2018-10-07 DIAGNOSIS — I2510 Atherosclerotic heart disease of native coronary artery without angina pectoris: Secondary | ICD-10-CM

## 2018-10-07 DIAGNOSIS — Z9861 Coronary angioplasty status: Secondary | ICD-10-CM | POA: Diagnosis not present

## 2018-10-07 DIAGNOSIS — E1165 Type 2 diabetes mellitus with hyperglycemia: Secondary | ICD-10-CM

## 2018-10-07 LAB — POCT GLYCOSYLATED HEMOGLOBIN (HGB A1C): Hemoglobin A1C: 13 % — AB (ref 4.0–5.6)

## 2018-10-07 LAB — GLUCOSE, POCT (MANUAL RESULT ENTRY): POC Glucose: 546 mg/dl — AB (ref 70–99)

## 2018-10-07 MED ORDER — INSULIN PEN NEEDLE 31G X 5 MM MISC: 1 refills | Status: DC

## 2018-10-07 NOTE — Telephone Encounter (Signed)
Per [facility], "Callers husband is expecting a call and it cut off."

## 2018-10-07 NOTE — Patient Instructions (Signed)
START the Humalog mix insulin before breakfast and dinnertime. Take this right before eating  Make sure you rotate the places where you are doing the injections later in the soft part of the stomach on the sides after pinching up the skin  Start dialing 8 units of insulin before breakfast and before dinnertime for the next 3 days  After 3 days if your blood sugar is still over 200 increase the dose to 12 units twice a day  Once your blood sugars are below 200 continue the same dose until your follow-up visit  Continue with Janumet unless we call you to stop this  HYDROCORTISONE: Take 3 tablets a day for the next 2 days with 1 pill in the evening and 2 in the morning  Try to stay hydrated  CHECK blood sugar 2 times a day before breakfast and dinnertime and sometimes before bedtime also

## 2018-10-07 NOTE — Telephone Encounter (Signed)
Is he having any excessive thirst and urination?  Also did he have any cortisone shots? Will need to confirm that his blood sugars are accurate, is he able to come in as a work in at 2:30 PM to confirm?

## 2018-10-07 NOTE — Telephone Encounter (Signed)
Attempted to call pt on home phone twice and cell phone once and received no answer at any number.

## 2018-10-07 NOTE — Telephone Encounter (Signed)
Pt will be coming for appt at 2:30.

## 2018-10-07 NOTE — Progress Notes (Signed)
Patient ID: Carl Gutierrez, male   DOB: 1946/05/28, 73 y.o.   MRN: [id number]           Reason for Appointment: Type II Diabetes follow-up   History of Present Illness     Recent history:     Non-insulin hypoglycemic drugs: Janumet XR 100/1000 daily  Prior A1c done in 1/20 was excellent at 6.4  Patient was seen urgently as a walk-in because of marked hyperglycemia with recent high blood sugars  He states that for the last 2 to 3 weeks he has been having excessive thirst and urination He also admits to having a dry mouth with a slight sore throat He has been drinking a lot of water Also has had fatigue and some weakness Compared to his last visit he appears to have lost 26 pounds Does not complain of lightheadedness or nausea, no abdominal pain or vomiting  However he did not call to report his high sugars even though they have been over 400 since 09/26/2018     Side effects from medications: None       Monitors blood glucose:  Irregularly    Glucometer:  One Touch           Blood Glucose readings from meter download: He has checked on 3 days this month  Morning blood sugars range from 442-483 with the last reading on 4/21            Dietician visit: Most recent: 11/2012     Weight history  :  Wt Readings from Last 3 Encounters:  10/07/18 211 lb (95.7 kg)  08/02/18 237 lb 6.4 oz (107.7 kg)  06/28/18 237 lb 12.8 oz (107.9 kg)            Diabetes labs:  Lab Results  Component Value Date   HGBA1C 13.0 (A) 10/07/2018   HGBA1C 6.4 06/17/2018   HGBA1C 7.0 (H) 01/29/2018   Lab Results  Component Value Date   MICROALBUR 2.1 (H) 06/17/2018   LDLCALC 77 11/16/2017   CREATININE 1.29 06/17/2018     Allergies as of 10/07/2018      Reactions   Hydrochlorothiazide    Leg and side pain with this   Other    Blood pressure med name unknown.      Medication List       Accurate as of October 07, 2018  9:40 PM. Always use your most recent med list.         amLODipine 10 MG tablet Commonly known as:  NORVASC TAKE 1 TABLET BY MOUTH EVERY DAY   aspirin 81 MG tablet Take 81 mg by mouth daily. Reported on 09/05/2015   glucose blood test strip Use OneTouch Verio test strips as instructed to check blood sugar once daily.DX:E11.65   hydrocortisone 20 MG tablet Commonly known as:  CORTEF Take 10-20 mg by mouth 2 (two) times daily. Takes 1 tablet in the morning and 1/2 tablet at night   levothyroxine 88 MCG tablet Commonly known as:  SYNTHROID TAKE 1 TABLET BY MOUTH EVERY DAY   lisinopril 20 MG tablet Commonly known as:  ZESTRIL TAKE 1 TABLET (20 MG TOTAL) BY MOUTH DAILY.   metoprolol succinate 25 MG 24 hr tablet Commonly known as:  TOPROL-XL TAKE 1 TABLET BY MOUTH EVERY DAY   rosuvastatin 40 MG tablet Commonly known as:  CRESTOR Take 1 tablet (40 mg total) by mouth daily.   SitaGLIPtin-MetFORMIN HCl [phone] MG Tb24 Commonly known as:  Janumet XR Take 1  tablet by mouth daily. Take 1 tablet by mouth once daily.       Allergies:  Allergies  Allergen Reactions  . Hydrochlorothiazide     Leg and side pain with this  . Other     Blood pressure med name unknown.    Past Medical History:  Diagnosis Date  . CAD S/P percutaneous coronary angioplasty February 2004; 2008   PCI-dLAD: 2.25 mm x 16 mm Express BMS; Ramus- 2.75 mm x 18 mm Cypher DES (2.8 mm); follow-up 11/'08: Patent LAD/ramus stents. 90% small OM. EF 50-60%.; Myoview 1/'12: Exercise 9 min, 10 METS with no ischemia/infarction.   . Clotting disorder ([place])   . Diabetes mellitus    Low-dose metformin  . DJD (degenerative joint disease), lumbosacral     status post L4-L5 surgery  . Essential hypertension   . History of DVT of lower extremity     postoperative  . Hyperlipidemia with target LDL less than 70   . Hypothyroidism    Synthroid  . Panhypopituitarism ([place])    Status post pituitary adenoma removal in 2012  . Recurrent deep vein thrombosis (DVT) ([place]) 10/01/2015     Past Surgical History:  Procedure Laterality Date  . BRAIN SURGERY  2012   Removal of pituitary adenoma  . CARDIAC CATHETERIZATION  November 2008   Significant LAD tapering but no significant disease the distal stent. Patent Ramus stent. 90% lesion in small OM 2; small nondominant RCA. Medical therapy. EF 50-60%.  . CORONARY ANGIOPLASTY WITH STENT PLACEMENT  February 2004   PCI-dLAD: 2.25 mm x 16 mm BMS;;PCI- RI: 2.75 mm 18 mm Cypher DES  . NM MYOVIEW LTD  January 2012   Exercise ~9 min - 10 METS; no ischemia or infarction  . PROSTATECTOMY N/A 08/13/2015   Procedure: TRANSVESICLE OPEN PROSTATECTOMY** ;  Surgeon: Carolan Clines, MD;  Location: [facility];  Service: Urology;  Laterality: N/A;  . SPINE SURGERY     L4-L5    Family History  Problem Relation Age of Onset  . Cancer Mother        pancreatic ca    Social History:  reports that he quit smoking about 25 years ago. He has never used smokeless tobacco. He reports that he does not drink alcohol or use drugs.  Review of Systems:  Hypertension:  He is taking amlodipine and lisinopril as well as 25 mg metoprolol prescribed by his cardiologist Does not check at home  No episodes of nausea or abdominal pain Has not had any steroid injections or higher dose oral steroids elsewhere  HYPOPITUITARISM: He has been compliant with his levothyroxine and Cortef as before No changes were made on his last visit    LABS:  Office Visit on 10/07/2018  Component Date Value Ref Range Status  . Hemoglobin A1C 10/07/2018 13.0* 4.0 - 5.6 % Final  . POC Glucose 10/07/2018 546* 70 - 99 mg/dl Final     Examination:   BP 110/60 (BP Location: Left Arm, Patient Position: Sitting, Cuff Size: Normal)   Pulse 74   Ht 5\' 11"  (1.803 m)   Wt 211 lb (95.7 kg)   SpO2 97%   BMI 29.43 kg/m   Body mass index is 29.43 kg/m.    ASSESSMENT/ PLAN:    Diabetes type 2:   Although he has had stable diabetes for the last few years with previously  excellent control he appears to have marked abrupt onset of significant hyperglycemia He has lost over 25 pounds and is  symptomatic No history of excessive steroid use exogenously  Also no history suggestive of acute pancreatitis, no history of alcohol abuse  His glucose is 546 in the office and A1c is markedly increased at 13%  Discussed with the patient that he appears to have abrupt onset of insulin deficiency Does not have symptoms suggestive of ketoacidosis Although he may have mild dehydration he does not appear to be symptomatic with significant orthostasis or appearing to require hospitalization today  PLAN:  He will start PREMIXED insulin twice a day for convenience and control of both basal and mealtime insulin requirements  Discussed in detail the nature of insulin deficiency, need for replacement with injectable insulin  He was instructed in detail how to do an injection with the insulin pen  Sample of Humalog mix insulin was given and he will start with 8 units twice daily  Discussed the nature of premixed insulin to cover hyperglycemia with the following meal and also overnight and during the day  For simplicity will start with 8 units but after 3 days go up to 12 units if still having hypoglycemia  He will need to follow-up with nurse educator on Monday for further education, review of his progress and he can also have his wife come in for more information  To check renal function today to decide on continuing Janumet which may not be effective at this point  Also for stress doses he will take extra hydrocortisone for 2 days because of his adrenal insufficiency  Patient Instructions  START the Humalog mix insulin before breakfast and dinnertime. Take this right before eating  Make sure you rotate the places where you are doing the injections later in the soft part of the stomach on the sides after pinching up the skin  Start dialing 8 units of insulin before  breakfast and before dinnertime for the next 3 days  After 3 days if your blood sugar is still over 200 increase the dose to 12 units twice a day  Once your blood sugars are below 200 continue the same dose until your follow-up visit  Continue with Janumet unless we call you to stop this  HYDROCORTISONE: Take 3 tablets a day for the next 2 days with 1 pill in the evening and 2 in the morning  Try to stay hydrated  CHECK blood sugar 2 times a day before breakfast and dinnertime and sometimes before bedtime also   Counseling time on subjects discussed in assessment and plan sections is over 50% of today's 25 minute visit   Elayne Snare 10/07/2018, 9:40 PM

## 2018-10-07 NOTE — Telephone Encounter (Signed)
Pt called and stated that he has been having high blood sugars and reported that on 10/03/2018 his blood sugar ws 433 in the morning, and on 09/26/2018, it was 453 at 6pm and 422 at 6:45pm. Pt is taking Janumet 1 tablet PO QD as directed.   Please advise. (these are the only readings he has other than from one reading in February.

## 2018-10-08 ENCOUNTER — Telehealth: Payer: Self-pay | Admitting: Endocrinology

## 2018-10-08 ENCOUNTER — Telehealth: Payer: Self-pay

## 2018-10-08 LAB — BASIC METABOLIC PANEL
BUN: 37 mg/dL — ABNORMAL HIGH (ref 6–23)
CO2: 17 mEq/L — ABNORMAL LOW (ref 19–32)
Calcium: 9.3 mg/dL (ref 8.4–10.5)
Chloride: 96 mEq/L (ref 96–112)
Creatinine, Ser: 1.6 mg/dL — ABNORMAL HIGH (ref 0.40–1.50)
GFR: 51.26 mL/min — ABNORMAL LOW (ref >60.00)
Glucose, Bld: 568 mg/dL (ref 70–99)
Potassium: 5.3 mEq/L — ABNORMAL HIGH (ref 3.5–5.1)
Sodium: 130 mEq/L — ABNORMAL LOW (ref 135–145)

## 2018-10-08 NOTE — Telephone Encounter (Signed)
Please call and ask him to increase insulin to 12 units 2x daily from dinner time today. If sugar still >300 on Sunday go up to 16 units, thanks

## 2018-10-08 NOTE — Telephone Encounter (Signed)
Main lab just called with a critical glucose of 568.

## 2018-10-08 NOTE — Telephone Encounter (Signed)
High blood sugar was expected.  Please confirm that he has been able to start his insulin yesterday with a sample of Humalog 75/25.  Also let him know that his blood test showed dehydration

## 2018-10-08 NOTE — Telephone Encounter (Signed)
Notified patient of message from Dr. Dwyane Dee, patient expressed understanding and agreement.  Patient did start the insulin sample and his glucose this morning was 430. Patient will be following up Monday as directed by Dr. Dwyane Dee.

## 2018-10-09 NOTE — Telephone Encounter (Signed)
Patient was called and he states that his blood sugars are still over 400, currently still taking 8 units twice daily regularly.  However he thinks he is feeling better subjectively Advised him to increase the dose to 16 units twice a day and follow-up on Monday

## 2018-10-11 ENCOUNTER — Encounter: Payer: Medicare Other | Attending: Endocrinology | Admitting: Nutrition

## 2018-10-11 ENCOUNTER — Other Ambulatory Visit: Payer: Self-pay

## 2018-10-11 DIAGNOSIS — E119 Type 2 diabetes mellitus without complications: Secondary | ICD-10-CM | POA: Insufficient documentation

## 2018-10-11 DIAGNOSIS — Z794 Long term (current) use of insulin: Secondary | ICD-10-CM | POA: Diagnosis not present

## 2018-10-12 NOTE — Patient Instructions (Addendum)
Take 20u of insulin 10-15 min. Before breakfast, and 14u before supper.   Call if blood sugars drop below 70 Make sure to eat lunch every day before 1PM  Test blood sugars before breakfast and supper, and any time you "don't feel right"

## 2018-10-12 NOTE — Progress Notes (Signed)
Discussed diabetes in general with patient and wife.  We also discussed ways he can decrease his insulin resisitance, ie by exercising, reducing his waist size, and lowering his blood sugar with insulin and diet. He had no questions.  He is testing his blood sugars using a One Touch Verio, but did not know how to change the lancet.  He was shown how to do this.  We also discussed goals for blood sugars: below 120 ac meals.  Insulin dose:  Humalog 75/25 16u BID He reports no difficulty giving the insulin and I stressed the importance of rotating sites, and using different sites than his abdomen.  We discussed timing of the insulins, and importance of eating lunch each day.  His activity level vary daily, and has a "bad knee" that gives out every once in a while.  His only activity now is cutting the grass, once a week.  Discussed walking, and encouraged him to work up to 30-40 min. Daily 5 days/wk.   Discussed the importance of diet, and the need to have all three food groups in each meal.  We discussed what each food group was, and what foods were in each group.  His typical day: 7:30-8: bfast: 1 egg, grits with 1-1.2 pieces of bread-no butter, or biscuit.  Or Frosted Flakes-medium bowl, 2-3 X/wk. 2PM: Tuna salad with 10 crackers, and occasionally  Gingerale to drink, but usually water, or sandwich with chips 4PM: occasionally has snack of raisens and nuts 6PM: supper of 4-6 ounces protein with 1 starchy veg, and 1 non starchy veg.   With water to drink 9PM: 1-2 nights/wk with have grapes, or walniuts and raisens.   He reports that his meals are smaller in portion sizes, and he is feeling "so much better since he started the insulin on Friday.  Discussed the need to stop the cereal and milk, and gave him suggestions for other breakfast items.  Also the need to stop the gingerale and switch to diet gingerale.  He agreed to do both.    FBS today was 252.   Blood sugar today was 323 3 hours after breakfast  of eggs and beans and grits.  Readings were shown to Dr. Dwyane Dee, and insulin dose adjustment was made. Written instructions were given for 209u in the AM and 14u in the PM.   Discussed low blood sugars-symptoms and treatments.   BD pen starter kit given with glucose tablets and information on pen use.

## 2018-10-24 ENCOUNTER — Other Ambulatory Visit: Payer: Self-pay | Admitting: Endocrinology

## 2018-10-25 ENCOUNTER — Other Ambulatory Visit: Payer: Self-pay | Admitting: Endocrinology

## 2018-10-25 DIAGNOSIS — E230 Hypopituitarism: Secondary | ICD-10-CM

## 2018-10-25 DIAGNOSIS — E1165 Type 2 diabetes mellitus with hyperglycemia: Secondary | ICD-10-CM

## 2018-10-26 ENCOUNTER — Other Ambulatory Visit: Payer: Self-pay

## 2018-10-26 ENCOUNTER — Other Ambulatory Visit: Payer: Self-pay | Admitting: Endocrinology

## 2018-10-26 ENCOUNTER — Other Ambulatory Visit (INDEPENDENT_AMBULATORY_CARE_PROVIDER_SITE_OTHER): Payer: Medicare Other

## 2018-10-26 ENCOUNTER — Telehealth: Payer: Self-pay | Admitting: Endocrinology

## 2018-10-26 DIAGNOSIS — E230 Hypopituitarism: Secondary | ICD-10-CM | POA: Diagnosis not present

## 2018-10-26 DIAGNOSIS — E1165 Type 2 diabetes mellitus with hyperglycemia: Secondary | ICD-10-CM

## 2018-10-26 LAB — CBC
HCT: 40.3 % (ref 39.0–52.0)
Hemoglobin: 13.6 g/dL (ref 13.0–17.0)
MCHC: 33.7 g/dL (ref 30.0–36.0)
MCV: 89.5 fl (ref 78.0–100.0)
Platelets: 298 10*3/uL (ref 150.0–400.0)
RBC: 4.51 Mil/uL (ref 4.22–5.81)
RDW: 14.3 % (ref 11.5–15.5)
WBC: 7.8 10*3/uL (ref 4.0–10.5)

## 2018-10-26 LAB — COMPREHENSIVE METABOLIC PANEL
ALT: 18 U/L (ref 0–53)
AST: 15 U/L (ref 0–37)
Albumin: 4 g/dL (ref 3.5–5.2)
Alkaline Phosphatase: 72 U/L (ref 39–117)
BUN: 19 mg/dL (ref 6–23)
CO2: 26 mEq/L (ref 19–32)
Calcium: 9.6 mg/dL (ref 8.4–10.5)
Chloride: 103 mEq/L (ref 96–112)
Creatinine, Ser: 1.36 mg/dL (ref 0.40–1.50)
GFR: 61.83 mL/min (ref >60.00)
Glucose, Bld: 159 mg/dL — ABNORMAL HIGH (ref 70–99)
Potassium: 4.4 mEq/L (ref 3.5–5.1)
Sodium: 138 mEq/L (ref 135–145)
Total Bilirubin: 0.3 mg/dL (ref 0.2–1.2)
Total Protein: 7 g/dL (ref 6.0–8.3)

## 2018-10-26 LAB — T4, FREE: Free T4: 1.24 ng/dL (ref 0.60–1.60)

## 2018-10-26 MED ORDER — INSULIN ASPART PROT & ASPART (70-30 MIX) 100 UNIT/ML PEN: PEN_INJECTOR | SUBCUTANEOUS | 1 refills | Status: DC

## 2018-10-26 NOTE — Telephone Encounter (Signed)
Prescription for NovoLog mix has been sent, this appears to be covered

## 2018-10-26 NOTE — Telephone Encounter (Signed)
MEDICATION: Humalog 75-25  PHARMACY:  [facility]  IS THIS A 90 DAY SUPPLY :   IS PATIENT OUT OF MEDICATION: Yes  IF NOT; HOW MUCH IS LEFT:   LAST APPOINTMENT DATE: @5 /03/2019  NEXT APPOINTMENT DATE:@5 /15/2020  DO WE HAVE YOUR PERMISSION TO LEAVE A DETAILED MESSAGE:  OTHER COMMENTS:  Patient has finished the samples.  **Let patient know to contact pharmacy at the end of the day to make sure medication is ready. **  ** Please notify patient to allow 48-72 hours to process**  **Encourage patient to contact the pharmacy for refills or they can request refills through [facility]**

## 2018-10-26 NOTE — Telephone Encounter (Signed)
Pt does not have Humalog 75/25 on med list. What dose do you want him to be on?

## 2018-10-29 ENCOUNTER — Encounter: Payer: Self-pay | Admitting: Endocrinology

## 2018-10-29 ENCOUNTER — Other Ambulatory Visit: Payer: Self-pay

## 2018-10-29 ENCOUNTER — Ambulatory Visit: Payer: Medicare Other | Admitting: Endocrinology

## 2018-10-29 ENCOUNTER — Ambulatory Visit (INDEPENDENT_AMBULATORY_CARE_PROVIDER_SITE_OTHER): Payer: Medicare Other | Admitting: Endocrinology

## 2018-10-29 DIAGNOSIS — I2510 Atherosclerotic heart disease of native coronary artery without angina pectoris: Secondary | ICD-10-CM | POA: Diagnosis not present

## 2018-10-29 DIAGNOSIS — Z9861 Coronary angioplasty status: Secondary | ICD-10-CM

## 2018-10-29 DIAGNOSIS — E1165 Type 2 diabetes mellitus with hyperglycemia: Secondary | ICD-10-CM

## 2018-10-29 NOTE — Progress Notes (Signed)
Patient ID: Carl Gutierrez, male   DOB: 12/19/1945, 73 y.o.   MRN: [id number]           Reason for Appointment: Type II Diabetes follow-up   History of Present Illness    Today's office visit was provided via telemedicine using video technique Explained to the patient and the the limitations of evaluation and management by telemedicine and the availability of in person appointments.  The patient understood the limitations and agreed to proceed. Patient also understood that the telehealth visit is billable. . Location of the patient: Home . Location of the provider: Office Only the patient and myself were participating in the encounter   Recent history:     INSULIN regimen: NovoLog 70/30 insulin, 20 units before breakfast and 14 before dinner  Non-insulin hypoglycemic drugs: Janumet XR 100/1000 daily  Prior A1c done in 1/20 was excellent at 6.4 and 4/20 was up to 13.0  Current management, blood sugar patterns and problems identified:  He has been on insulin since he had severe hyperglycemia in April  He has no difficulty doing the injections but also has been seen by diabetes educator in follow-up  He still appears to be needing relatively high doses of insulin as despite increasing his insulin doses in the morning on 4/27 he still has readings mostly over 200 at dinnertime  However has not checked his blood sugars around midday or bedtime as directed and only before breakfast and dinnertime  FASTING blood sugars are relatively better but still mostly over 150  No symptoms of hypoglycemia at any time  He thinks he is trying to up to 30 minutes a day on most days  Also he thinks he is avoiding high-fat foods and starchy foods or snacks in the afternoon contributing to his high readings  However blood sugars at dinnertime are fluctuating and as high as 284 before the meal Subjectively feeling much better now than before     Side effects from medications: None        Monitors blood glucose:  Twice a day now    Glucometer:  One Touch           Blood Glucose readings from meter reviewed by patient:    PRE-MEAL Fasting Lunch Dinner Bedtime Overall  Glucose range:  155-183   194-284    Mean/median:                   Dietician visit: Most recent: 11/2012     Weight history   Wt Readings from Last 3 Encounters:  10/07/18 211 lb (95.7 kg)  08/02/18 237 lb 6.4 oz (107.7 kg)  06/28/18 237 lb 12.8 oz (107.9 kg)            Diabetes labs:  Lab Results  Component Value Date   HGBA1C 13.0 (A) 10/07/2018   HGBA1C 6.4 06/17/2018   HGBA1C 7.0 (H) 01/29/2018   Lab Results  Component Value Date   MICROALBUR 2.1 (H) 06/17/2018   LDLCALC 77 11/16/2017   CREATININE 1.36 10/26/2018     Allergies as of 10/29/2018      Reactions   Hydrochlorothiazide    Leg and side pain with this   Other    Blood pressure med name unknown.      Medication List       Accurate as of Oct 29, 2018  8:51 AM. If you have any questions, ask your nurse or doctor.        amLODipine 10 MG  tablet Commonly known as:  NORVASC TAKE 1 TABLET BY MOUTH EVERY DAY   aspirin 81 MG tablet Take 81 mg by mouth daily. Reported on 09/05/2015   glucose blood test strip Use OneTouch Verio test strips as instructed to check blood sugar once daily.DX:E11.65   hydrocortisone 20 MG tablet Commonly known as:  CORTEF Take 10-20 mg by mouth 2 (two) times daily. Takes 1 tablet in the morning and 1/2 tablet at night   insulin aspart protamine - aspart (70-30) 100 UNIT/ML FlexPen Commonly known as:  NovoLOG Mix 70/30 FlexPen 20 units before breakfast and 14 before dinner.   Insulin Pen Needle 31G X 5 MM Misc Use for insulin pen twice a day   Janumet XR [phone] MG Tb24 Generic drug:  SitaGLIPtin-MetFORMIN HCl TAKE 1 TABLET BY MOUTH DAILY.   levothyroxine 88 MCG tablet Commonly known as:  SYNTHROID TAKE 1 TABLET BY MOUTH EVERY DAY   lisinopril 20 MG tablet Commonly known as:   ZESTRIL TAKE 1 TABLET (20 MG TOTAL) BY MOUTH DAILY.   metoprolol succinate 25 MG 24 hr tablet Commonly known as:  TOPROL-XL TAKE 1 TABLET BY MOUTH EVERY DAY   rosuvastatin 40 MG tablet Commonly known as:  CRESTOR Take 1 tablet (40 mg total) by mouth daily.       Allergies:  Allergies  Allergen Reactions  . Hydrochlorothiazide     Leg and side pain with this  . Other     Blood pressure med name unknown.    Past Medical History:  Diagnosis Date  . CAD S/P percutaneous coronary angioplasty February 2004; 2008   PCI-dLAD: 2.25 mm x 16 mm Express BMS; Ramus- 2.75 mm x 18 mm Cypher DES (2.8 mm); follow-up 11/'08: Patent LAD/ramus stents. 90% small OM. EF 50-60%.; Myoview 1/'12: Exercise 9 min, 10 METS with no ischemia/infarction.   . Clotting disorder ([place])   . Diabetes mellitus    Low-dose metformin  . DJD (degenerative joint disease), lumbosacral     status post L4-L5 surgery  . Essential hypertension   . History of DVT of lower extremity     postoperative  . Hyperlipidemia with target LDL less than 70   . Hypothyroidism    Synthroid  . Panhypopituitarism ([place])    Status post pituitary adenoma removal in 2012  . Recurrent deep vein thrombosis (DVT) ([place]) 10/01/2015    Past Surgical History:  Procedure Laterality Date  . BRAIN SURGERY  2012   Removal of pituitary adenoma  . CARDIAC CATHETERIZATION  November 2008   Significant LAD tapering but no significant disease the distal stent. Patent Ramus stent. 90% lesion in small OM 2; small nondominant RCA. Medical therapy. EF 50-60%.  . CORONARY ANGIOPLASTY WITH STENT PLACEMENT  February 2004   PCI-dLAD: 2.25 mm x 16 mm BMS;;PCI- RI: 2.75 mm 18 mm Cypher DES  . NM MYOVIEW LTD  January 2012   Exercise ~9 min - 10 METS; no ischemia or infarction  . PROSTATECTOMY N/A 08/13/2015   Procedure: TRANSVESICLE OPEN PROSTATECTOMY** ;  Surgeon: Carolan Clines, MD;  Location: [facility];  Service: Urology;  Laterality: N/A;  . SPINE  SURGERY     L4-L5    Family History  Problem Relation Age of Onset  . Cancer Mother        pancreatic ca    Social History:  reports that he quit smoking about 25 years ago. He has never used smokeless tobacco. He reports that he does not drink alcohol or use  drugs.  Review of Systems:  Hypertension:  He is taking amlodipine and lisinopril as well as 25 mg metoprolol prescribed by his cardiologist Does not check at home   HYPOPITUITARISM: He has been taking levothyroxine and Cortef as before Takes Cortef as directed both morning and evening consistently Free T4 has been normal with current dosage of 88 mcg levothyroxine    LABS:  Lab on 10/26/2018  Component Date Value Ref Range Status  . WBC 10/26/2018 7.8  4.0 - 10.5 K/uL Final  . RBC 10/26/2018 4.51  4.22 - 5.81 Mil/uL Final  . Platelets 10/26/2018 298.0  150.0 - 400.0 K/uL Final  . Hemoglobin 10/26/2018 13.6  13.0 - 17.0 g/dL Final  . HCT 10/26/2018 40.3  39.0 - 52.0 % Final  . MCV 10/26/2018 89.5  78.0 - 100.0 fl Final  . MCHC 10/26/2018 33.7  30.0 - 36.0 g/dL Final  . RDW 10/26/2018 14.3  11.5 - 15.5 % Final  . Free T4 10/26/2018 1.24  0.60 - 1.60 ng/dL Final   Comment: Specimens from patients who are undergoing biotin therapy and /or ingesting biotin supplements may contain high levels of biotin.  The higher biotin concentration in these specimens interferes with this Free T4 assay.  Specimens that contain high levels  of biotin may cause false high results for this Free T4 assay.  Please interpret results in light of the total clinical presentation of the patient.    . Sodium 10/26/2018 138  135 - 145 mEq/L Final  . Potassium 10/26/2018 4.4  3.5 - 5.1 mEq/L Final  . Chloride 10/26/2018 103  96 - 112 mEq/L Final  . CO2 10/26/2018 26  19 - 32 mEq/L Final  . Glucose, Bld 10/26/2018 159* 70 - 99 mg/dL Final  . BUN 10/26/2018 19  6 - 23 mg/dL Final  . Creatinine, Ser 10/26/2018 1.36  0.40 - 1.50 mg/dL Final  . Total  Bilirubin 10/26/2018 0.3  0.2 - 1.2 mg/dL Final  . Alkaline Phosphatase 10/26/2018 72  39 - 117 U/L Final  . AST 10/26/2018 15  0 - 37 U/L Final  . ALT 10/26/2018 18  0 - 53 U/L Final  . Total Protein 10/26/2018 7.0  6.0 - 8.3 g/dL Final  . Albumin 10/26/2018 4.0  3.5 - 5.2 g/dL Final  . Calcium 10/26/2018 9.6  8.4 - 10.5 mg/dL Final  . GFR 10/26/2018 61.83  >60.00 mL/min Final     Examination:   There were no vitals taken for this visit.  There is no height or weight on file to calculate BMI.    ASSESSMENT/ PLAN:    Diabetes type 2:   He is now on premixed insulin twice a day since he had severe hypoglycemia in April Although subjectively doing better his blood sugars are still averaging over 200 at dinnertime Not clear if his blood sugars are also high at lunch  He thinks his diet is fairly well regulated and not causing his hyperglycemia especially in the afternoon Also trying to be active with walking Also on Janumet  RENAL dysfunction: This is likely to be from dehydration and approaching normal now  PLAN:  He will start checking blood sugars at lunch and bedtime and can alternate with the other readings  He will call readings next week  Most likely he will need to continue increasing doses of insulin  For now increase the morning dose to 24 and evening dose to 60  If his blood sugars are disproportionately higher  between lunch and dinner may need to have him take insulin 3 times a day  Follow-up for review in another month also  To continue Janumet unchanged but may consider Invokana if weight tends to go up excessively or blood sugars are difficult to control  There are no Patient Instructions on file for this visit.     Elayne Snare 10/29/2018, 8:51 AM

## 2018-11-03 ENCOUNTER — Telehealth: Payer: Self-pay | Admitting: Nutrition

## 2018-11-03 NOTE — Telephone Encounter (Signed)
No change in insulin needed, please tell him to check blood sugars more midmorning and after supper and not in the morning daily

## 2018-11-03 NOTE — Telephone Encounter (Signed)
Patient's blood sugars:   Date       FBS         acS 11/03/18    88 5/19         116            122 5/18         85               254 5/17        108            223 5/16         110           180 5/15         110            177 Patient is taking AM: 24u and 16u acS.

## 2018-11-03 NOTE — Telephone Encounter (Signed)
Talked with wife.  Told her no changes in insulin doses, but to have husband test 2 hours after breakfast and 2hr. After supper.

## 2018-11-14 ENCOUNTER — Other Ambulatory Visit: Payer: Self-pay | Admitting: Family Medicine

## 2018-11-14 DIAGNOSIS — E039 Hypothyroidism, unspecified: Secondary | ICD-10-CM

## 2018-11-14 NOTE — Telephone Encounter (Signed)
Pt with upcoming appt Requested Prescriptions  Pending Prescriptions Disp Refills  . levothyroxine (SYNTHROID) 88 MCG tablet [Pharmacy Med Name: LEVOTHYROXINE 88 MCG TABLET] 90 tablet 0    Sig: TAKE 1 TABLET BY MOUTH EVERY DAY     Endocrinology:  Hypothyroid Agents Failed - 11/14/2018 12:31 PM      Failed - TSH needs to be rechecked within 3 months after an abnormal result. Refill until TSH is due.      Failed - Valid encounter within last 12 months    Recent Outpatient Visits          12 months ago Medicare annual wellness visit, subsequent   [facility], Ranell Patrick, MD   1 year ago Blister of right lower leg, initial encounter   [facility], Ranell Patrick, MD   2 years ago Medicare annual wellness visit, subsequent   [facility], Ranell Patrick, MD   2 years ago Type 2 diabetes mellitus without complication, without long-term current use of insulin [facility])   [facility] [place], Ranell Patrick, MD   3 years ago Acute deep vein thrombosis (DVT) of proximal vein of both lower extremities [facility])   [facility] [place], MD      Future Appointments            In 1 week  [facility] at [place], [facility]   In 1 week Wendie Agreste, MD [facility] [place], [place] - TSH in normal range and within 360 days    TSH  Date Value Ref Range Status  01/29/2018 4.09 0.35 - 4.50 uIU/mL Final

## 2018-11-18 ENCOUNTER — Encounter: Payer: Medicare Other | Admitting: Family Medicine

## 2018-11-22 DIAGNOSIS — N400 Enlarged prostate without lower urinary tract symptoms: Secondary | ICD-10-CM | POA: Diagnosis not present

## 2018-11-23 ENCOUNTER — Ambulatory Visit (INDEPENDENT_AMBULATORY_CARE_PROVIDER_SITE_OTHER): Payer: Medicare Other | Admitting: Family Medicine

## 2018-11-23 ENCOUNTER — Encounter: Payer: Self-pay | Admitting: Family Medicine

## 2018-11-23 ENCOUNTER — Other Ambulatory Visit: Payer: Self-pay

## 2018-11-23 VITALS — BP 138/72 | Resp 16 | Ht 71.25 in | Wt 215.6 lb

## 2018-11-23 VITALS — BP 138/70 | HR 66 | Temp 98.0°F | Resp 16 | Wt 215.6 lb

## 2018-11-23 DIAGNOSIS — E039 Hypothyroidism, unspecified: Secondary | ICD-10-CM

## 2018-11-23 DIAGNOSIS — E1165 Type 2 diabetes mellitus with hyperglycemia: Secondary | ICD-10-CM | POA: Diagnosis not present

## 2018-11-23 DIAGNOSIS — Z9861 Coronary angioplasty status: Secondary | ICD-10-CM

## 2018-11-23 DIAGNOSIS — Z794 Long term (current) use of insulin: Secondary | ICD-10-CM | POA: Diagnosis not present

## 2018-11-23 DIAGNOSIS — I2510 Atherosclerotic heart disease of native coronary artery without angina pectoris: Secondary | ICD-10-CM | POA: Diagnosis not present

## 2018-11-23 DIAGNOSIS — E785 Hyperlipidemia, unspecified: Secondary | ICD-10-CM

## 2018-11-23 DIAGNOSIS — I10 Essential (primary) hypertension: Secondary | ICD-10-CM | POA: Diagnosis not present

## 2018-11-23 DIAGNOSIS — Z0000 Encounter for general adult medical examination without abnormal findings: Secondary | ICD-10-CM

## 2018-11-23 NOTE — Patient Instructions (Addendum)
I will check a lipid panel as now on new cholesterol medication as well as your TSH/thyroid test.  No medication changes for now.  Keep follow-up with endocrinologist as planned and follow-up with me in 6 months.  Let me know if there are questions and stay safe.    If you have lab work done today you will be contacted with your lab results within the next 2 weeks.  If you have not heard from [place] then please contact us. The fastest way to get your results is to register for My Chart.   IF you received an x-ray today, you will receive an invoice from [facility]. Please contact [facility] at [phone] with questions or concerns regarding your invoice.   IF you received labwork today, you will receive an invoice from [place]. Please contact [facility] at [phone] with questions or concerns regarding your invoice.   Our billing staff will not be able to assist you with questions regarding bills from these companies.  You will be contacted with the lab results as soon as they are available. The fastest way to get your results is to activate your My Chart account. Instructions are located on the last page of this paperwork. If you have not heard from [place] regarding the results in 2 weeks, please contact this office.

## 2018-11-23 NOTE — Progress Notes (Signed)
Subjective:    Patient ID: Carl Gutierrez, male    DOB: [birth year], 73 y.o.   MRN: [id number]  HPI Carl Gutierrez is a 73 y.o. male Presents today for: Chief Complaint  Patient presents with  . Medicare Wellness    patient is here for his wellness physical. Patient is doing good and is not having any problems at this time. Labs were done on 10/26/18  Here for physical, med review.  Seen earlier for Medicare wellness visit.  Diabetes: Insulin-dependent with hyperglycemia Followed by Dr. Dwyane Dee for diabetes, visit few weeks ago. Uncontrolled on last A1c, on insulin with plan on follow-up June 15th.  Currently on NovoLog 70/30, Janumet.  He is on ACE inhibitor as well as statin Up-to-date on foot exam, ophthalmology exam, Pneumovax.  Declined tetanus due to cost and hepatitis C screening was also declined Home readings :88-115.  No symptomatic lows.  Lab Results  Component Value Date   HGBA1C 13.0 (A) 10/07/2018    Coronary artery disease Cardiology Dr. Ellyn Hack, last office visit February 17 2 vessel PCI in 2004, 2008.  Managed with aspirin alone along with statin. Changed from Zocor to rosuvastatin on February visit as on amlodipine and risk for interactions.   Hypertension: BP Readings from Last 3 Encounters:  11/23/18 138/70  11/23/18 138/72  10/07/18 110/60   Lab Results  Component Value Date   CREATININE 1.36 10/26/2018  Currently taking lisinopril 20 mg daily, Toprol XL 25 mg daily, amlodipine 10 mg daily.  Walking 51mins per day.   Hyperlipidemia:  Lab Results  Component Value Date   CHOL 143 11/16/2017   HDL 38 (L) 11/16/2017   LDLCALC 77 11/16/2017   LDLDIRECT 85.0 05/18/2014   TRIG 138 11/16/2017   CHOLHDL 3.8 11/16/2017   Lab Results  Component Value Date   ALT 18 10/26/2018   AST 15 10/26/2018   ALKPHOS 72 10/26/2018   BILITOT 0.3 10/26/2018  Now on Crestor 40 mg daily. Tolerating ok, no new myalgias/side effects.   Hypothyroidism: Lab Results   Component Value Date   TSH 4.09 01/29/2018  Synthroid 88 mcg daily. No new temp changes, skin or hair changes, no heart palpitations.       Patient Active Problem List   Diagnosis Date Noted  . Anemia in chronic kidney disease 11/30/2015  . Recurrent deep vein thrombosis (DVT) ([place]) 10/01/2015  . Hematuria, unspecified 10/01/2015  . Elevated serum creatinine 10/01/2015  . BPH (benign prostatic hyperplasia) 08/13/2015  . Benign localized hyperplasia of prostate with urinary obstruction 07/23/2015  . Preop cardiovascular exam 07/17/2015  . Encounter for CDL (commercial driving license) exam 79/89/2119  . Obesity (BMI 30-39.9) 06/26/2013  . Essential hypertension 06/26/2013  . Panhypopituitarism ([place]) 11/17/2011  .   11/17/2011  . Diabetes mellitus ([place]) 11/17/2011  . Hyperlipidemia LDL goal <70 11/17/2011  . CAD S/P percutaneous coronary angioplasty 07/19/2002   Past Medical History:  Diagnosis Date  . CAD S/P percutaneous coronary angioplasty February 2004; 2008   PCI-dLAD: 2.25 mm x 16 mm Express BMS; Ramus- 2.75 mm x 18 mm Cypher DES (2.8 mm); follow-up 11/'08: Patent LAD/ramus stents. 90% small OM. EF 50-60%.; Myoview 1/'12: Exercise 9 min, 10 METS with no ischemia/infarction.   . Clotting disorder ([place])   . Diabetes mellitus    Low-dose metformin  . DJD (degenerative joint disease), lumbosacral     status post L4-L5 surgery  . Essential hypertension   . History of DVT of lower extremity  postoperative  . Hyperlipidemia with target LDL less than 70   . Hypothyroidism    Synthroid  . Panhypopituitarism ([place])    Status post pituitary adenoma removal in 2012  . Recurrent deep vein thrombosis (DVT) ([place]) 10/01/2015   Past Surgical History:  Procedure Laterality Date  . BRAIN SURGERY  2012   Removal of pituitary adenoma  . CARDIAC CATHETERIZATION  November 2008   Significant LAD tapering but no significant disease the distal stent. Patent Ramus stent. 90% lesion in  small OM 2; small nondominant RCA. Medical therapy. EF 50-60%.  . CORONARY ANGIOPLASTY WITH STENT PLACEMENT  February 2004   PCI-dLAD: 2.25 mm x 16 mm BMS;;PCI- RI: 2.75 mm 18 mm Cypher DES  . NM MYOVIEW LTD  January 2012   Exercise ~9 min - 10 METS; no ischemia or infarction  . PROSTATECTOMY N/A 08/13/2015   Procedure: TRANSVESICLE OPEN PROSTATECTOMY** ;  Surgeon: Carolan Clines, MD;  Location: [facility];  Service: Urology;  Laterality: N/A;  . SPINE SURGERY     L4-L5   Allergies  Allergen Reactions  . Hydrochlorothiazide     Leg and side pain with this  . Other     Blood pressure med name unknown.   Prior to Admission medications   Medication Sig Start Date End Date Taking? Authorizing Provider  amLODipine (NORVASC) 10 MG tablet TAKE 1 TABLET BY MOUTH EVERY DAY 09/08/18   Leonie Man, MD  aspirin 81 MG tablet Take 81 mg by mouth daily. Reported on 09/05/2015    [provider]  glucose blood test strip Use OneTouch Verio test strips as instructed to check blood sugar once daily.DX:E11.65 09/30/18   Elayne Snare, MD  hydrocortisone (CORTEF) 20 MG tablet Take 10-20 mg by mouth 2 (two) times daily. Takes 1 tablet in the morning and 1/2 tablet at night    [provider]  insulin aspart protamine - aspart (NOVOLOG MIX 70/30 FLEXPEN) (70-30) 100 UNIT/ML FlexPen 20 units before breakfast and 14 before dinner. 10/26/18   Elayne Snare, MD  Insulin Pen Needle 31G X 5 MM MISC Use for insulin pen twice a day 10/07/18   Elayne Snare, MD  JANUMET XR [id number] MG TB24 TAKE 1 TABLET BY MOUTH DAILY. 10/24/18   Elayne Snare, MD  levothyroxine (SYNTHROID) 88 MCG tablet TAKE 1 TABLET BY MOUTH EVERY DAY 11/14/18   Wendie Agreste, MD  lisinopril (PRINIVIL,ZESTRIL) 20 MG tablet TAKE 1 TABLET (20 MG TOTAL) BY MOUTH DAILY. 07/24/16   Leonie Man, MD  metoprolol succinate (TOPROL-XL) 25 MG 24 hr tablet TAKE 1 TABLET BY MOUTH EVERY DAY 09/08/18   Leonie Man, MD  rosuvastatin (CRESTOR) 40  MG tablet Take 1 tablet (40 mg total) by mouth daily. 08/02/18 10/31/18  Leonie Man, MD  testosterone cypionate (DEPO-TESTOSTERONE) 200 MG/ML injection Inject 1.5 mLs (300 mg total) into the muscle every 21 ( twenty-one) days. 11/23/14 03/14/15  Elayne Snare, MD   Social History   Socioeconomic History  . Marital status: Married    Spouse name: Not on file  . Number of children: Not on file  . Years of education: Not on file  . Highest education level: Not on file  Occupational History  . Not on file  Social Needs  . Financial resource strain: Not on file  . Food insecurity:    Worry: Not on file    Inability: Not on file  . Transportation needs:    Medical: Not on file  Non-medical: Not on file  Tobacco Use  . Smoking status: Former Smoker    Last attempt to quit: 06/24/1993    Years since quitting: 25.4  . Smokeless tobacco: Never Used  Substance and Sexual Activity  . Alcohol use: No  . Drug use: No  . 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 Physical activity:    Days per week: Not on file    Minutes per session: Not on file  . Stress: Not on file  Relationships  . Social connections:    Talks on phone: Not on file    Gets together: Not on file    Attends religious service: Not on file    Active member of club or organization: Not on file    Attends meetings of clubs or organizations: Not on file    Relationship status: Not on file  . Intimate partner violence:    Fear of current or ex partner: Not on file    Emotionally abused: Not on file    Physically abused: Not on file    Forced sexual activity: Not on file  Other Topics Concern  . Not on file  Social History Narrative   He is a married father of 2, grandfather 64. Exercises only occasionally. Not as much as he desires 2. 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   He does not smoke cigarettes -- quit in 1995. Does not drink alcohol.     Review of Systems  Constitutional: Negative for fatigue and unexpected weight change.  Eyes: Negative for visual disturbance.  Respiratory: Negative for cough, chest tightness and shortness of breath.   Cardiovascular: Negative for chest pain, palpitations and leg swelling.  Gastrointestinal: Negative for abdominal pain and blood in stool.  Neurological: Negative for dizziness, light-headedness and headaches.       Objective:   Physical Exam Vitals signs reviewed.  Constitutional:      Appearance: He is well-developed.  HENT:     Head: Normocephalic and atraumatic.  Eyes:     Pupils: Pupils are equal, round, and reactive to light.  Neck:     Vascular: No carotid bruit or JVD.  Cardiovascular:     Rate and Rhythm: Normal rate and regular rhythm.     Heart sounds: Normal heart sounds. No murmur.  Pulmonary:     Effort: Pulmonary effort is normal.     Breath sounds: Normal breath sounds. No rales.  Skin:    General: Skin is warm and dry.  Neurological:     Mental Status: He is alert and oriented to person, place, and time.    Vitals:   11/23/18 0934  BP: 138/70  Pulse: 66  Resp: 16  Temp: 98 F (36.7 C)  TempSrc: Oral  SpO2: 98%  Weight: 215 lb 9.6 oz (97.8 kg)          Assessment & Plan:  Carl Gutierrez is a 73 y.o. male Hyperlipidemia, unspecified hyperlipidemia type - Plan: Lipid panel  - tolerating Crestor. Repeat labs.  Coronary artery disease involving native heart without angina pectoris, unspecified vessel or lesion type  -Asymptomatic, continue routine follow-up with cardiology.  Continue aspirin, statin.  Essential hypertension  -Stable on current regimen, no changes.   Type 2 diabetes mellitus with hyperglycemia, with long-term current use of insulin ([place])  -Reports improved home readings, plan for follow-up next week with endocrinologist.  Hypothyroidism, unspecified  type - Plan: TSH  -Recent free T4, will check TSH, continue same dose Synthroid  for now.   follow-up with endocrinology  Health maintenance discussed, declines tetanus and hep C testing at this time  No orders of the defined types were placed in this encounter.  Patient Instructions   I will check a lipid panel as now on new cholesterol medication as well as your TSH/thyroid test.  No medication changes for now.  Keep follow-up with endocrinologist as planned and follow-up with me in 6 months.  Let me know if there are questions and stay safe.    If you have lab work done today you will be contacted with your lab results within the next 2 weeks.  If you have not heard from [place] then please contact us. The fastest way to get your results is to register for My Chart.   IF you received an x-ray today, you will receive an invoice from [facility] Radiology. Please contact [facility] at [phone] with questions or concerns regarding your invoice.   IF you received labwork today, you will receive an invoice from [facility]. Please contact [facility] at [phone] with questions or concerns regarding your invoice.   Our billing staff will not be able to assist you with questions regarding bills from these companies.  You will be contacted with the lab results as soon as they are available. The fastest way to get your results is to activate your My Chart account. Instructions are located on the last page of this paperwork. If you have not heard from [place] regarding the results in 2 weeks, please contact this office.       Signed,   [name], MD [facility].  11/23/18 10:05 AM

## 2018-11-23 NOTE — Progress Notes (Signed)
Presents today for [employer] Visit   Date of last exam: 11/16/2017  Interpreter used for this visit? No  Visit done in office   Patient Care Team: Wendie Agreste, MD as PCP - General (Family Medicine) Leonie Man, MD as PCP - Cardiology (Cardiology) Wendie Agreste, MD as Consulting Physician [facility] Medicine)  Dr. Dwyane Dee     Other items to address today:   Declined Hepatitis C screening. [facility]. Declined Audiology at this time [place] TDAP due to insurance. Discussed Eye/Dental yearly exam  30 second up and down from chair observed      Other Screening: Last screening for diabetes: 09/17/2018 Last lipid screening:11/16/2017  ADVANCE DIRECTIVES: Discussed: yes  On File: no Materials Provided: no  Immunization status:  Immunization History  Administered Date(s) Administered  . Influenza, High Dose Seasonal PF 04/03/2015, 04/02/2017  . Influenza-Unspecified 03/16/2013, 04/05/2014, 04/03/2015, 04/22/2016  . Pneumococcal Conjugate-13 01/22/2015  . Pneumococcal Polysaccharide-23 06/16/2009  . Tdap 06/16/2005     There are no preventive care reminders to display for this patient.   Functional Status Survey: Is the patient deaf or have difficulty hearing?: Yes(was a gun owner decreased hearing) Does the patient have difficulty seeing, even when wearing glasses/contacts?: No Does the patient have difficulty concentrating, remembering, or making decisions?: No Does the patient have difficulty walking or climbing stairs?: No Does the patient have difficulty dressing or bathing?: No Does the patient have difficulty doing errands alone such as visiting a doctor's office or shopping?: No   6CIT Screen 11/23/2018 11/16/2017  What Year? 0 points 0 points  What month? 0 points 0 points  What time? 0 points 0 points  Count back from 20 0 points 0 points  Months in reverse 0 points 0 points  Repeat phrase 0 points 0 points   Total Score 0 0        Clinical Support from 11/23/2018 in [facility]  AUDIT-C Score  0       Home Environment:    Lives one story home with wife.  No scattered rugs No grab bars No trouble climbing stairs   Patient Active Problem List   Diagnosis Date Noted  . Anemia in chronic kidney disease 11/30/2015  . Recurrent deep vein thrombosis (DVT) ([place]) 10/01/2015  . Hematuria, unspecified 10/01/2015  . Elevated serum creatinine 10/01/2015  . BPH (benign prostatic hyperplasia) 08/13/2015  . Benign localized hyperplasia of prostate with urinary obstruction 07/23/2015  . Preop cardiovascular exam 07/17/2015  . Encounter for CDL (commercial driving license) exam 27/74/1287  . Obesity (BMI 30-39.9) 06/26/2013  . Essential hypertension 06/26/2013  . Panhypopituitarism ([place]) 11/17/2011  .   11/17/2011  . Diabetes mellitus ([place]) 11/17/2011  . Hyperlipidemia LDL goal <70 11/17/2011  . CAD S/P percutaneous coronary angioplasty 07/19/2002     Past Medical History:  Diagnosis Date  . CAD S/P percutaneous coronary angioplasty February 2004; 2008   PCI-dLAD: 2.25 mm x 16 mm Express BMS; Ramus- 2.75 mm x 18 mm Cypher DES (2.8 mm); follow-up 11/'08: Patent LAD/ramus stents. 90% small OM. EF 50-60%.; Myoview 1/'12: Exercise 9 min, 10 METS with no ischemia/infarction.   . Clotting disorder ([place])   . Diabetes mellitus    Low-dose metformin  . DJD (degenerative joint disease), lumbosacral     status post L4-L5 surgery  . Essential hypertension   . History of DVT of lower extremity     postoperative  .  Hyperlipidemia with target LDL less than 70   . Hypothyroidism    Synthroid  . Panhypopituitarism ([place])    Status post pituitary adenoma removal in 2012  . Recurrent deep vein thrombosis (DVT) ([place]) 10/01/2015     Past Surgical History:  Procedure Laterality Date  . BRAIN SURGERY  2012   Removal of pituitary adenoma  . CARDIAC CATHETERIZATION  November 2008    Significant LAD tapering but no significant disease the distal stent. Patent Ramus stent. 90% lesion in small OM 2; small nondominant RCA. Medical therapy. EF 50-60%.  . CORONARY ANGIOPLASTY WITH STENT PLACEMENT  February 2004   PCI-dLAD: 2.25 mm x 16 mm BMS;;PCI- RI: 2.75 mm 18 mm Cypher DES  . NM MYOVIEW LTD  January 2012   Exercise ~9 min - 10 METS; no ischemia or infarction  . PROSTATECTOMY N/A 08/13/2015   Procedure: TRANSVESICLE OPEN PROSTATECTOMY** ;  Surgeon: Carolan Clines, MD;  Location: [facility];  Service: Urology;  Laterality: N/A;  . SPINE SURGERY     L4-L5     Family History  Problem Relation Age of Onset  . Cancer Mother        pancreatic ca     Social History   Socioeconomic History  . Marital status: Married    Spouse name: Not on file  . Number of children: Not on file  . Years of education: Not on file  . Highest education level: Not on file  Occupational History  . Not on file  Social Needs  . Financial resource strain: Not on file  . Food insecurity:    Worry: Not on file    Inability: Not on file  . Transportation needs:    Medical: Not on file    Non-medical: Not on file  Tobacco Use  . Smoking status: Former Smoker    Last attempt to quit: 06/24/1993    Years since quitting: 25.4  . Smokeless tobacco: Never Used  Substance and Sexual Activity  . Alcohol use: No  . Drug use: No  . 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 Physical activity:    Days per week: Not on file    Minutes per session: Not on file  . Stress: Not on file  Relationships  . Social connections:    Talks on phone: Not on file    Gets together: Not on file    Attends religious service: Not on file    Active member of club or organization: Not on file    Attends meetings of clubs or organizations: Not on file    Relationship status: Not on file  . Intimate partner violence:    Fear of current or  ex partner: Not on file    Emotionally abused: Not on file    Physically abused: Not on file    Forced sexual activity: Not on file  Other Topics Concern  . Not on file  Social History Narrative   He is a married father of 2, grandfather 43. Exercises only occasionally. Not as much as he desires 2. 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   He does not smoke cigarettes -- quit in 1995. Does not drink alcohol.     Allergies  Allergen Reactions  . Hydrochlorothiazide     Leg and side pain with this  . Other     Blood pressure med name unknown.  Prior to Admission medications   Medication Sig Start Date End Date Taking? Authorizing Provider  amLODipine (NORVASC) 10 MG tablet TAKE 1 TABLET BY MOUTH EVERY DAY 09/08/18  Yes Leonie Man, MD  aspirin 81 MG tablet Take 81 mg by mouth daily. Reported on 09/05/2015   Yes [provider]  glucose blood test strip Use OneTouch Verio test strips as instructed to check blood sugar once daily.DX:E11.65 09/30/18  Yes Elayne Snare, MD  hydrocortisone (CORTEF) 20 MG tablet Take 10-20 mg by mouth 2 (two) times daily. Takes 1 tablet in the morning and 1/2 tablet at night   Yes [provider]  insulin aspart protamine - aspart (NOVOLOG MIX 70/30 FLEXPEN) (70-30) 100 UNIT/ML FlexPen 20 units before breakfast and 14 before dinner. 10/26/18  Yes Elayne Snare, MD  Insulin Pen Needle 31G X 5 MM MISC Use for insulin pen twice a day 10/07/18  Yes Elayne Snare, MD  JANUMET XR [phone] MG TB24 TAKE 1 TABLET BY MOUTH DAILY. 10/24/18  Yes Elayne Snare, MD  levothyroxine (SYNTHROID) 88 MCG tablet TAKE 1 TABLET BY MOUTH EVERY DAY 11/14/18  Yes Wendie Agreste, MD  lisinopril (PRINIVIL,ZESTRIL) 20 MG tablet TAKE 1 TABLET (20 MG TOTAL) BY MOUTH DAILY. 07/24/16  Yes Leonie Man, MD  metoprolol succinate (TOPROL-XL) 25 MG 24 hr tablet TAKE 1 TABLET BY MOUTH EVERY DAY 09/08/18  Yes Leonie Man, MD  rosuvastatin (CRESTOR) 40 MG tablet Take 1 tablet (40 mg  total) by mouth daily. 08/02/18 10/31/18  Leonie Man, MD  testosterone cypionate (DEPO-TESTOSTERONE) 200 MG/ML injection Inject 1.5 mLs (300 mg total) into the muscle every 21 ( twenty-one) days. 11/23/14 03/14/15  Elayne Snare, MD     Depression screen [facility] 2/9 11/23/2018 11/16/2017 12/27/2016 11/13/2016 01/10/2016  Decreased Interest 0 0 0 0 0  Down, Depressed, Hopeless 0 0 0 0 0  PHQ - 2 Score 0 0 0 0 0     Fall Risk  11/23/2018 11/16/2017 12/27/2016 11/13/2016 01/10/2016  Falls in the past year? 0 No No No No  Number falls in past yr: 0 - - - -  Injury with Fall? 0 - - - -  Risk for fall due to : History of fall(s) - - - -  Follow up Falls evaluation completed;Education provided;Falls prevention discussed - - - -      PHYSICAL EXAM: Resp 16   Ht 5' 11.25" (1.81 m)   Wt 215 lb 9.6 oz (97.8 kg)   SpO2 98%   BMI 29.86 kg/m    Wt Readings from Last 3 Encounters:  11/23/18 215 lb 9.6 oz (97.8 kg)  10/07/18 211 lb (95.7 kg)  08/02/18 237 lb 6.4 oz (107.7 kg)     No exam data present    Physical Exam   Education/Counseling provided regarding diet and exercise, prevention of chronic diseases, smoking/tobacco cessation, if applicable, and reviewed "Covered Medicare Preventive Services."   ASSESSMENT/PLAN: 1. Medicare annual wellness visit, subsequent

## 2018-11-24 LAB — TSH: TSH: 2.96 u[IU]/mL (ref 0.450–4.500)

## 2018-11-24 LAB — LIPID PANEL
Chol/HDL Ratio: 2.6 ratio (ref 0.0–5.0)
Cholesterol, Total: 125 mg/dL (ref 100–199)
HDL: 49 mg/dL (ref >39)
LDL Calculated: 55 mg/dL (ref 0–99)
Triglycerides: 106 mg/dL (ref 0–149)
VLDL Cholesterol Cal: 21 mg/dL (ref 5–40)

## 2018-11-29 ENCOUNTER — Other Ambulatory Visit: Payer: Self-pay

## 2018-11-29 ENCOUNTER — Ambulatory Visit (INDEPENDENT_AMBULATORY_CARE_PROVIDER_SITE_OTHER): Payer: Medicare Other | Admitting: Endocrinology

## 2018-11-29 ENCOUNTER — Encounter: Payer: Self-pay | Admitting: Endocrinology

## 2018-11-29 VITALS — BP 140/82 | HR 69 | Ht 71.25 in | Wt 219.4 lb

## 2018-11-29 DIAGNOSIS — I2510 Atherosclerotic heart disease of native coronary artery without angina pectoris: Secondary | ICD-10-CM

## 2018-11-29 DIAGNOSIS — I10 Essential (primary) hypertension: Secondary | ICD-10-CM | POA: Diagnosis not present

## 2018-11-29 DIAGNOSIS — E1165 Type 2 diabetes mellitus with hyperglycemia: Secondary | ICD-10-CM

## 2018-11-29 DIAGNOSIS — Z9861 Coronary angioplasty status: Secondary | ICD-10-CM

## 2018-11-29 LAB — BASIC METABOLIC PANEL WITH GFR
BUN: 20 mg/dL (ref 6–23)
CO2: 24 meq/L (ref 19–32)
Calcium: 9.4 mg/dL (ref 8.4–10.5)
Chloride: 107 meq/L (ref 96–112)
Creatinine, Ser: 1.46 mg/dL (ref 0.40–1.50)
GFR: 56.95 mL/min — ABNORMAL LOW
Glucose, Bld: 85 mg/dL (ref 70–99)
Potassium: 4.1 meq/L (ref 3.5–5.1)
Sodium: 140 meq/L (ref 135–145)

## 2018-11-29 MED ORDER — NOVOLOG MIX 70/30 FLEXPEN (70-30) 100 UNIT/ML ~~LOC~~ SUPN: PEN_INJECTOR | SUBCUTANEOUS | 1 refills | Status: DC

## 2018-11-29 MED ORDER — GLUCOSE BLOOD VI STRP: ORAL_STRIP | 3 refills | Status: DC

## 2018-11-29 NOTE — Patient Instructions (Addendum)
Take 22 in am and 12 units pm   Check blood sugars on waking up 3 days a week  Also check blood sugars about 2 hours after meals and do this after different meals by rotation  Recommended blood sugar levels on waking up are 90-130 and about 2 hours after meal is 130-160  Please bring your blood sugar monitor to each visit, thank you  Keep walking

## 2018-11-29 NOTE — Progress Notes (Addendum)
- Patient ID: Carl Gutierrez, male   DOB: 1946/03/07, 73 y.o.   MRN: [id number]           Reason for Appointment: Type II Diabetes follow-up   History of Present Illness    Recent history:     INSULIN regimen: NovoLog 70/30 insulin, 24units before breakfast and 16 before dinner  Non-insulin hypoglycemic drugs: Janumet XR 100/1000 daily  Prior A1c on 4/20 was up to 13.0  Current management, blood sugar patterns and problems identified:  He has been on slightly higher dose of insulin since 10/2018 he was having mostly high readings before breakfast and dinnertime  He has started doing some readings after dinner also although not clear if he has done some readings before dinner  He also is forgetting to check his sugars before or after lunch  Although he has not complained of hypoglycemia he has had blood sugars as low as 69 was after dinner once  He feels fairly good and is again trying to be active with walking about 20 minutes  More recently he says that he is trying to cut back on drinks with sugar and ice cream etc. as he realized that his blood sugars were higher when he would consume these especially when going out  He does do well with remembering to take his insulin before starting to eat  Also recently stopping eating out  Previously his weight was about 237 pounds and he is gradually gaining this back Despite his blood sugars in the morning has been almost consistently below 100 he is not feeling hypoglycemic, currently last 5 readings range from 75 up to 109     Side effects from medications: None       Monitors blood glucose:  Twice a day.    Glucometer:  One Touch           Blood Glucose readings from meter reviewed by patient:    PRE-MEAL Fasting Lunch Dinner Bedtime Overall  Glucose range:  75-122      Mean/median:  88    103   POST-MEAL PC Breakfast PC Lunch PC Dinner  Glucose range:    69-223  Mean/median:    133   Previous readings:  PRE-MEAL  Fasting Lunch Dinner Bedtime Overall  Glucose range:  155-183   194-284    Mean/median:                   Dietician visit: Most recent: 11/2012     Weight history   Wt Readings from Last 3 Encounters:  11/29/18 219 lb 6.4 oz (99.5 kg)  11/23/18 215 lb 9.6 oz (97.8 kg)  11/23/18 215 lb 9.6 oz (97.8 kg)            Diabetes labs:  Lab Results  Component Value Date   HGBA1C 13.0 (A) 10/07/2018   HGBA1C 6.4 06/17/2018   HGBA1C 7.0 (H) 01/29/2018   Lab Results  Component Value Date   MICROALBUR 2.1 (H) 06/17/2018   LDLCALC 55 11/23/2018   CREATININE 1.46 11/29/2018     Allergies as of 11/29/2018      Reactions   Hydrochlorothiazide    Leg and side pain with this   Other    Blood pressure med name unknown.      Medication List       Accurate as of November 29, 2018  1:26 PM. If you have any questions, ask your nurse or doctor.        amLODipine  10 MG tablet Commonly known as: NORVASC TAKE 1 TABLET BY MOUTH EVERY DAY   aspirin 81 MG tablet Take 81 mg by mouth daily. Reported on 09/05/2015   glucose blood test strip Use OneTouch Verio test strips as instructed to check blood sugar once daily.DX:E11.65   hydrocortisone 20 MG tablet Commonly known as: CORTEF Take 10-20 mg by mouth 2 (two) times daily. Takes 1 tablet in the morning and 1/2 tablet at night   Insulin Pen Needle 31G X 5 MM Misc Use for insulin pen twice a day   Janumet XR [phone] MG Tb24 Generic drug: SitaGLIPtin-MetFORMIN HCl TAKE 1 TABLET BY MOUTH DAILY.   levothyroxine 88 MCG tablet Commonly known as: SYNTHROID TAKE 1 TABLET BY MOUTH EVERY DAY   lisinopril 20 MG tablet Commonly known as: ZESTRIL TAKE 1 TABLET (20 MG TOTAL) BY MOUTH DAILY.   metoprolol succinate 25 MG 24 hr tablet Commonly known as: TOPROL-XL TAKE 1 TABLET BY MOUTH EVERY DAY   NovoLOG Mix 70/30 FlexPen (70-30) 100 UNIT/ML FlexPen Generic drug: insulin aspart protamine - aspart 26 units before breakfast and 14 before  dinner.   rosuvastatin 40 MG tablet Commonly known as: CRESTOR Take 1 tablet (40 mg total) by mouth daily.       Allergies:  Allergies  Allergen Reactions  . Hydrochlorothiazide     Leg and side pain with this  . Other     Blood pressure med name unknown.    Past Medical History:  Diagnosis Date  . CAD S/P percutaneous coronary angioplasty February 2004; 2008   PCI-dLAD: 2.25 mm x 16 mm Express BMS; Ramus- 2.75 mm x 18 mm Cypher DES (2.8 mm); follow-up 11/'08: Patent LAD/ramus stents. 90% small OM. EF 50-60%.; Myoview 1/'12: Exercise 9 min, 10 METS with no ischemia/infarction.   . Clotting disorder ([place])   . Diabetes mellitus    Low-dose metformin  . DJD (degenerative joint disease), lumbosacral     status post L4-L5 surgery  . Essential hypertension   . History of DVT of lower extremity     postoperative  . Hyperlipidemia with target LDL less than 70   . Hypothyroidism    Synthroid  . Panhypopituitarism ([place])    Status post pituitary adenoma removal in 2012  . Recurrent deep vein thrombosis (DVT) ([place]) 10/01/2015    Past Surgical History:  Procedure Laterality Date  . BRAIN SURGERY  2012   Removal of pituitary adenoma  . CARDIAC CATHETERIZATION  November 2008   Significant LAD tapering but no significant disease the distal stent. Patent Ramus stent. 90% lesion in small OM 2; small nondominant RCA. Medical therapy. EF 50-60%.  . CORONARY ANGIOPLASTY WITH STENT PLACEMENT  February 2004   PCI-dLAD: 2.25 mm x 16 mm BMS;;PCI- RI: 2.75 mm 18 mm Cypher DES  . NM MYOVIEW LTD  January 2012   Exercise ~9 min - 10 METS; no ischemia or infarction  . PROSTATECTOMY N/A 08/13/2015   Procedure: TRANSVESICLE OPEN PROSTATECTOMY** ;  Surgeon: Carolan Clines, MD;  Location: [facility];  Service: Urology;  Laterality: N/A;  . SPINE SURGERY     L4-L5    Family History  Problem Relation Age of Onset  . Cancer Mother        pancreatic ca    Social History:  reports that he quit  smoking about 25 years ago. He has never used smokeless tobacco. He reports that he does not drink alcohol or use drugs.  Review of Systems:  Hypertension:  He is taking amlodipine and lisinopril as well as 25 mg metoprolol prescribed by his cardiologist Does not check at home Also recently seen by PCP  BP Readings from Last 3 Encounters:  11/29/18 140/82  11/23/18 138/70  11/23/18 138/72    HYPOPITUITARISM: He has been taking levothyroxine and Cortef as before Takes Cortef as directed both morning and evening consistently Free T4 has been normal with current dosage of 88 mcg levothyroxine    LABS:  Office Visit on 11/29/2018  Component Date Value Ref Range Status  . Sodium 11/29/2018 140  135 - 145 mEq/L Final  . Potassium 11/29/2018 4.1  3.5 - 5.1 mEq/L Final  . Chloride 11/29/2018 107  96 - 112 mEq/L Final  . CO2 11/29/2018 24  19 - 32 mEq/L Final  . Glucose, Bld 11/29/2018 85  70 - 99 mg/dL Final  . BUN 11/29/2018 20  6 - 23 mg/dL Final  . Creatinine, Ser 11/29/2018 1.46  0.40 - 1.50 mg/dL Final  . Calcium 11/29/2018 9.4  8.4 - 10.5 mg/dL Final  . GFR 11/29/2018 56.95* >60.00 mL/min Final  Office Visit on 11/23/2018  Component Date Value Ref Range Status  . TSH 11/23/2018 2.960  0.450 - 4.500 uIU/mL Final  . Cholesterol, Total 11/23/2018 125  100 - 199 mg/dL Final  . Triglycerides 11/23/2018 106  0 - 149 mg/dL Final  . HDL 11/23/2018 49  >39 mg/dL Final  . VLDL Cholesterol Cal 11/23/2018 21  5 - 40 mg/dL Final  . LDL Calculated 11/23/2018 55  0 - 99 mg/dL Final  . Chol/HDL Ratio 11/23/2018 2.6  0.0 - 5.0 ratio Final   Comment:                                   T. Chol/HDL Ratio                                             Men  Women                               1/2 Avg.Risk  3.4    3.3                                   Avg.Risk  5.0    4.4                                2X Avg.Risk  9.6    7.1                                3X Avg.Risk 23.4   11.0       Examination:   BP 140/82 (BP Location: Left Arm, Patient Position: Sitting, Cuff Size: Normal)   Pulse 69   Ht 5' 11.25" (1.81 m)   Wt 219 lb 6.4 oz (99.5 kg)   SpO2 97%   BMI 30.39 kg/m   Body mass index is 30.39 kg/m.    ASSESSMENT/ PLAN:    Diabetes type 2:   He is on premixed insulin  twice a day since he had severe hyperglycemia in April  With increasing his dose and also improving his diet his blood sugars are excellent when he checks them at home Does not check readings before and after lunch and not clear if his premixed insulin is working well for breakfast and lunch meals He has been able to improve his diet Trying to walk also  RENAL dysfunction: This needs to be rechecked  HYPERTENSION: Followed by PCP, blood pressure high normal  PLAN:  He will continue his insulin but reduce the dose is down to 22 in the morning and 12 in the evening before dinner  He needs to rotate when he checks his blood sugars  Continue regular exercise with walking  Discussed staying away from regular soft drinks and may have any kind of diet drinks  A1c in 2 months  Check fructosamine today  Patient Instructions  Take 22 in am and 12 units pm   Check blood sugars on waking up 3 days a week  Also check blood sugars about 2 hours after meals and do this after different meals by rotation  Recommended blood sugar levels on waking up are 90-130 and about 2 hours after meal is 130-160  Please bring your blood sugar monitor to each visit, thank you  Keep walking      Elayne Snare 11/29/2018, 1:26 PM

## 2018-11-30 LAB — FRUCTOSAMINE: Fructosamine: 254 umol/L (ref 0–285)

## 2018-12-21 ENCOUNTER — Other Ambulatory Visit: Payer: Self-pay | Admitting: Endocrinology

## 2019-01-14 ENCOUNTER — Other Ambulatory Visit: Payer: Self-pay | Admitting: Family Medicine

## 2019-01-14 DIAGNOSIS — E039 Hypothyroidism, unspecified: Secondary | ICD-10-CM

## 2019-01-14 NOTE — Telephone Encounter (Signed)
Requested Prescriptions  Pending Prescriptions Disp Refills  . levothyroxine (SYNTHROID) 88 MCG tablet [Pharmacy Med Name: LEVOTHYROXINE 88 MCG TABLET] 90 tablet 1    Sig: TAKE 1 TABLET BY MOUTH EVERY DAY     Endocrinology:  Hypothyroid Agents Failed - 01/14/2019  9:12 AM      Failed - TSH needs to be rechecked within 3 months after an abnormal result. Refill until TSH is due.      Passed - TSH in normal range and within 360 days    TSH  Date Value Ref Range Status  11/23/2018 2.960 0.450 - 4.500 uIU/mL Final         Passed - Valid encounter within last 12 months    Recent Outpatient Visits          1 month ago Medicare annual wellness visit, subsequent   [facility], Ranell Patrick, MD   1 month ago Hyperlipidemia, unspecified hyperlipidemia type   [facility], Ranell Patrick, MD   1 year ago Medicare annual wellness visit, subsequent   [facility], Ranell Patrick, MD   2 years ago Blister of right lower leg, initial encounter   [facility], Ranell Patrick, MD   2 years ago Medicare annual wellness visit, subsequent   [facility], Ranell Patrick, MD      Future Appointments            In 4 months Carlota Raspberry Ranell Patrick, MD [facility], [facility]

## 2019-01-17 ENCOUNTER — Other Ambulatory Visit: Payer: Self-pay | Admitting: Cardiology

## 2019-01-18 ENCOUNTER — Telehealth: Payer: Self-pay | Admitting: Cardiology

## 2019-01-18 ENCOUNTER — Other Ambulatory Visit: Payer: Self-pay

## 2019-01-18 MED ORDER — ROSUVASTATIN CALCIUM 40 MG PO TABS: 40.0000 mg | ORAL_TABLET | Freq: Every day | ORAL | 3 refills | Status: DC

## 2019-01-18 NOTE — Telephone Encounter (Signed)
Medication sent to pharmacy. Thanks!

## 2019-01-18 NOTE — Telephone Encounter (Signed)
New message     Pt c/o medication issue:  1. Name of Medication:rosuvastatin (CRESTOR) 40 MG tablet(Expired)  2. How are you currently taking this medication (dosage and times per day)? n/a  3. Are you having a reaction (difficulty breathing--STAT)? n/a  4. What is your medication issue? Per Patient's wife needs a new prescription for this medication sent to [facility] in [place]. Please advise.

## 2019-01-19 ENCOUNTER — Other Ambulatory Visit: Payer: Self-pay | Admitting: Endocrinology

## 2019-01-20 NOTE — Telephone Encounter (Signed)
Following his last visit with me - we changed him from simvastatin to rosuvastatin.  It is listed on his med list in both his PCP & DM MD's clinic notes.  This is the med that I would like him to take.  Glenetta Hew, MD

## 2019-01-24 ENCOUNTER — Other Ambulatory Visit: Payer: Self-pay

## 2019-01-24 ENCOUNTER — Other Ambulatory Visit (INDEPENDENT_AMBULATORY_CARE_PROVIDER_SITE_OTHER): Payer: Medicare Other

## 2019-01-24 DIAGNOSIS — E1165 Type 2 diabetes mellitus with hyperglycemia: Secondary | ICD-10-CM | POA: Diagnosis not present

## 2019-01-24 LAB — COMPREHENSIVE METABOLIC PANEL
ALT: 14 U/L (ref 0–53)
AST: 15 U/L (ref 0–37)
Albumin: 4.2 g/dL (ref 3.5–5.2)
Alkaline Phosphatase: 61 U/L (ref 39–117)
BUN: 18 mg/dL (ref 6–23)
CO2: 25 mEq/L (ref 19–32)
Calcium: 9.2 mg/dL (ref 8.4–10.5)
Chloride: 107 mEq/L (ref 96–112)
Creatinine, Ser: 1.34 mg/dL (ref 0.40–1.50)
GFR: 62.85 mL/min (ref >60.00)
Glucose, Bld: 101 mg/dL — ABNORMAL HIGH (ref 70–99)
Potassium: 4.5 mEq/L (ref 3.5–5.1)
Sodium: 140 mEq/L (ref 135–145)
Total Bilirubin: 0.3 mg/dL (ref 0.2–1.2)
Total Protein: 6.8 g/dL (ref 6.0–8.3)

## 2019-01-24 LAB — HEMOGLOBIN A1C: Hgb A1c MFr Bld: 7 % — ABNORMAL HIGH (ref 4.6–6.5)

## 2019-01-25 NOTE — Progress Notes (Signed)
- Patient ID: Carl Gutierrez, male   DOB: 1945/10/06, 73 y.o.   MRN: [id number]           Reason for Appointment: Type II Diabetes follow-up   History of Present Illness    Recent history:     INSULIN regimen: NovoLog 70/30 insulin, 22 units before breakfast and 12  before dinner  Non-insulin hypoglycemic drugs: Janumet XR 100/1000 daily  Prior A1c on 4/20 was up to 13.0 and is now 7%  Current management, blood sugar patterns and problems identified:  He has been checking blood sugars mostly in the morning  Although his morning insulin was reduced slightly on the last visit his blood sugars are overall still well controlled  Previously was having readings as low as 69  However has checked his blood sugars only infrequently in the evenings after supper and none after breakfast or lunch  He is usually trying to stay away from regular soft drinks  He thinks he is eating relatively healthy meals  Although he says he is trying to walk up to 20 minutes a day he has gained back some weight  Overall feels fairly good He understands that he is going to take insulin before breakfast and suppertime consistently However he is asking about stopping insulin     Side effects from medications: None       Monitors blood glucose:  Twice a day.    Glucometer:  One Touch           Blood Glucose readings from meter reviewed by patient:    PRE-MEAL Fasting Lunch Dinner Bedtime Overall  Glucose range:  76-104      Mean/median:  92     97   POST-MEAL PC Breakfast PC Lunch PC Dinner  Glucose range:  116   101-141  Mean/median:    120    Previous readings:  PRE-MEAL Fasting Lunch Dinner Bedtime Overall  Glucose range:  75-122      Mean/median:  88    103   POST-MEAL PC Breakfast PC Lunch PC Dinner  Glucose range:    69-223  Mean/median:    133             Dietician visit: Most recent: 11/2012     Weight history   Wt Readings from Last 3 Encounters:  01/26/19 223 lb 9.6 oz  (101.4 kg)  11/29/18 219 lb 6.4 oz (99.5 kg)  11/23/18 215 lb 9.6 oz (97.8 kg)            Diabetes labs:  Lab Results  Component Value Date   HGBA1C 7.0 (H) 01/24/2019   HGBA1C 13.0 (A) 10/07/2018   HGBA1C 6.4 06/17/2018   Lab Results  Component Value Date   MICROALBUR 2.1 (H) 06/17/2018   LDLCALC 55 11/23/2018   CREATININE 1.34 01/24/2019     Allergies as of 01/26/2019      Reactions   Hydrochlorothiazide    Leg and side pain with this   Other    Blood pressure med name unknown.      Medication List       Accurate as of January 26, 2019  8:39 AM. If you have any questions, ask your nurse or doctor.        amLODipine 10 MG tablet Commonly known as: NORVASC TAKE 1 TABLET BY MOUTH EVERY DAY   aspirin 81 MG tablet Take 81 mg by mouth daily. Reported on 09/05/2015   glucose blood test strip Use OneTouch Verio  test strips as instructed to check blood sugar once daily.DX:E11.65   hydrocortisone 20 MG tablet Commonly known as: CORTEF Take 10-20 mg by mouth 2 (two) times daily. Takes 1 tablet in the morning and 1/2 tablet at night   Insulin Pen Needle 31G X 5 MM Misc Use for insulin pen twice a day   Janumet XR [phone] MG Tb24 Generic drug: SitaGLIPtin-MetFORMIN HCl TAKE 1 TABLET BY MOUTH DAILY.   levothyroxine 88 MCG tablet Commonly known as: SYNTHROID TAKE 1 TABLET BY MOUTH EVERY DAY   lisinopril 20 MG tablet Commonly known as: ZESTRIL TAKE 1 TABLET (20 MG TOTAL) BY MOUTH DAILY.   metoprolol succinate 25 MG 24 hr tablet Commonly known as: TOPROL-XL TAKE 1 TABLET BY MOUTH EVERY DAY   NovoLOG Mix 70/30 FlexPen (70-30) 100 UNIT/ML FlexPen Generic drug: insulin aspart protamine - aspart INSTILL 20 UNITS BEFORE BREAKFAST AND 14 BEFORE DINNER. (SWITCHING FROM HUMALOG DUE TO INS.) What changed: additional instructions   rosuvastatin 40 MG tablet Commonly known as: CRESTOR TAKE 1 TABLET BY MOUTH EVERY DAY       Allergies:  Allergies  Allergen  Reactions  . Hydrochlorothiazide     Leg and side pain with this  . Other     Blood pressure med name unknown.    Past Medical History:  Diagnosis Date  . CAD S/P percutaneous coronary angioplasty February 2004; 2008   PCI-dLAD: 2.25 mm x 16 mm Express BMS; Ramus- 2.75 mm x 18 mm Cypher DES (2.8 mm); follow-up 11/'08: Patent LAD/ramus stents. 90% small OM. EF 50-60%.; Myoview 1/'12: Exercise 9 min, 10 METS with no ischemia/infarction.   . Clotting disorder ([place])   . Diabetes mellitus    Low-dose metformin  . DJD (degenerative joint disease), lumbosacral     status post L4-L5 surgery  . Essential hypertension   . History of DVT of lower extremity     postoperative  . Hyperlipidemia with target LDL less than 70   . Hypothyroidism    Synthroid  . Panhypopituitarism ([place])    Status post pituitary adenoma removal in 2012  . Recurrent deep vein thrombosis (DVT) ([place]) 10/01/2015    Past Surgical History:  Procedure Laterality Date  . BRAIN SURGERY  2012   Removal of pituitary adenoma  . CARDIAC CATHETERIZATION  November 2008   Significant LAD tapering but no significant disease the distal stent. Patent Ramus stent. 90% lesion in small OM 2; small nondominant RCA. Medical therapy. EF 50-60%.  . CORONARY ANGIOPLASTY WITH STENT PLACEMENT  February 2004   PCI-dLAD: 2.25 mm x 16 mm BMS;;PCI- RI: 2.75 mm 18 mm Cypher DES  . NM MYOVIEW LTD  January 2012   Exercise ~9 min - 10 METS; no ischemia or infarction  . PROSTATECTOMY N/A 08/13/2015   Procedure: TRANSVESICLE OPEN PROSTATECTOMY** ;  Surgeon: Carolan Clines, MD;  Location: [facility];  Service: Urology;  Laterality: N/A;  . SPINE SURGERY     L4-L5    Family History  Problem Relation Age of Onset  . Cancer Mother        pancreatic ca    Social History:  reports that he quit smoking about 25 years ago. He has never used smokeless tobacco. He reports that he does not drink alcohol or use drugs.  Review of Systems:  Hypertension:   He is taking amlodipine and lisinopril as well as 25 mg metoprolol prescribed by his cardiologist Does check at home occasionally, recently 130/75   BP Readings from  Last 3 Encounters:  01/26/19 (!) 142/80  11/29/18 140/82  11/23/18 138/70    Lab Results  Component Value Date   CREATININE 1.34 01/24/2019   CREATININE 1.46 11/29/2018   CREATININE 1.36 10/26/2018     HYPOPITUITARISM: He has been taking levothyroxine and Cortef as before Takes Cortef as directed both morning and evening consistently No lightheadedness recently  Free T4 has been normal with current dosage of 88 mcg levothyroxine  Lab Results  Component Value Date   TSH 2.960 11/23/2018   TSH 4.09 01/29/2018   TSH 1.51 05/23/2016   FREET4 1.24 10/26/2018   FREET4 1.17 06/17/2018   FREET4 1.08 01/29/2018       LABS:  Lab on 01/24/2019  Component Date Value Ref Range Status  . Sodium 01/24/2019 140  135 - 145 mEq/L Final  . Potassium 01/24/2019 4.5  3.5 - 5.1 mEq/L Final  . Chloride 01/24/2019 107  96 - 112 mEq/L Final  . CO2 01/24/2019 25  19 - 32 mEq/L Final  . Glucose, Bld 01/24/2019 101* 70 - 99 mg/dL Final  . BUN 01/24/2019 18  6 - 23 mg/dL Final  . Creatinine, Ser 01/24/2019 1.34  0.40 - 1.50 mg/dL Final  . Total Bilirubin 01/24/2019 0.3  0.2 - 1.2 mg/dL Final  . Alkaline Phosphatase 01/24/2019 61  39 - 117 U/L Final  . AST 01/24/2019 15  0 - 37 U/L Final  . ALT 01/24/2019 14  0 - 53 U/L Final  . Total Protein 01/24/2019 6.8  6.0 - 8.3 g/dL Final  . Albumin 01/24/2019 4.2  3.5 - 5.2 g/dL Final  . Calcium 01/24/2019 9.2  8.4 - 10.5 mg/dL Final  . GFR 01/24/2019 62.85  >60.00 mL/min Final  . Hgb A1c MFr Bld 01/24/2019 7.0* 4.6 - 6.5 % Final   Glycemic Control Guidelines for People with Diabetes:Non Diabetic:  <6%Goal of Therapy: <7%Additional Action Suggested:  >8%      Examination:   BP (!) 142/80 (BP Location: Left Arm, Patient Position: Sitting, Cuff Size: Normal)   Pulse (!) 57   Ht 5'  11.25" (1.81 m)   Wt 223 lb 9.6 oz (101.4 kg)   SpO2 97%   BMI 30.97 kg/m   Body mass index is 30.97 kg/m.    ASSESSMENT/ PLAN:    Diabetes type 2:   He is on premixed insulin twice a day started when he had marked hyperglycemia in 09/2018  He has checked blood sugars mostly fasting and not clear if he may have still some postprandial hyperglycemia A1c is 7%  However blood sugars at home are excellent without hypoglycemia either by history or from his blood sugar record  RENAL dysfunction: Slightly better   HYPERTENSION: Followed by PCP, blood pressure high normal  PLAN:  He will reduce evening dose to 10 units  Discussed that it is unclear whether he has long-term insulin deficiency or not as yet but since he is still requiring over 30 units a day will need to continue and only slowly taper down  However he needs to check more readings after breakfast and lunch  May need to consider cutting back on insulin if blood sugars are in the 70s and 80s more consistently and he will call if needed  Encouraged him to walk regularly and work further on either maintaining or losing some weight  Will recheck his labs for pituitary function on the next visit also  Patient Instructions  Pm dose 10 units  Elayne Snare 01/26/2019, 8:39 AM

## 2019-01-26 ENCOUNTER — Ambulatory Visit (INDEPENDENT_AMBULATORY_CARE_PROVIDER_SITE_OTHER): Payer: Medicare Other | Admitting: Endocrinology

## 2019-01-26 ENCOUNTER — Other Ambulatory Visit: Payer: Self-pay | Admitting: Endocrinology

## 2019-01-26 ENCOUNTER — Other Ambulatory Visit: Payer: Self-pay

## 2019-01-26 ENCOUNTER — Encounter: Payer: Self-pay | Admitting: Endocrinology

## 2019-01-26 VITALS — BP 142/80 | HR 57 | Ht 71.25 in | Wt 223.6 lb

## 2019-01-26 DIAGNOSIS — Z9861 Coronary angioplasty status: Secondary | ICD-10-CM | POA: Diagnosis not present

## 2019-01-26 DIAGNOSIS — I2510 Atherosclerotic heart disease of native coronary artery without angina pectoris: Secondary | ICD-10-CM

## 2019-01-26 DIAGNOSIS — E1165 Type 2 diabetes mellitus with hyperglycemia: Secondary | ICD-10-CM | POA: Diagnosis not present

## 2019-01-26 DIAGNOSIS — E230 Hypopituitarism: Secondary | ICD-10-CM | POA: Diagnosis not present

## 2019-01-26 NOTE — Patient Instructions (Signed)
Pm dose 10 units

## 2019-02-13 ENCOUNTER — Other Ambulatory Visit: Payer: Self-pay | Admitting: Endocrinology

## 2019-02-20 DIAGNOSIS — Z23 Encounter for immunization: Secondary | ICD-10-CM | POA: Diagnosis not present

## 2019-03-28 ENCOUNTER — Other Ambulatory Visit: Payer: Medicare Other

## 2019-04-11 ENCOUNTER — Other Ambulatory Visit: Payer: Self-pay

## 2019-04-27 ENCOUNTER — Ambulatory Visit: Payer: Medicare Other | Admitting: Endocrinology

## 2019-05-03 ENCOUNTER — Other Ambulatory Visit: Payer: Self-pay

## 2019-05-03 ENCOUNTER — Other Ambulatory Visit (INDEPENDENT_AMBULATORY_CARE_PROVIDER_SITE_OTHER): Payer: Medicare Other

## 2019-05-03 DIAGNOSIS — E1165 Type 2 diabetes mellitus with hyperglycemia: Secondary | ICD-10-CM | POA: Diagnosis not present

## 2019-05-03 DIAGNOSIS — E230 Hypopituitarism: Secondary | ICD-10-CM

## 2019-05-03 LAB — COMPREHENSIVE METABOLIC PANEL
ALT: 11 U/L (ref 0–53)
AST: 13 U/L (ref 0–37)
Albumin: 4.2 g/dL (ref 3.5–5.2)
Alkaline Phosphatase: 75 U/L (ref 39–117)
BUN: 20 mg/dL (ref 6–23)
CO2: 27 mEq/L (ref 19–32)
Calcium: 9.2 mg/dL (ref 8.4–10.5)
Chloride: 106 mEq/L (ref 96–112)
Creatinine, Ser: 1.43 mg/dL (ref 0.40–1.50)
GFR: 58.27 mL/min — ABNORMAL LOW (ref >60.00)
Glucose, Bld: 106 mg/dL — ABNORMAL HIGH (ref 70–99)
Potassium: 4.3 mEq/L (ref 3.5–5.1)
Sodium: 141 mEq/L (ref 135–145)
Total Bilirubin: 0.3 mg/dL (ref 0.2–1.2)
Total Protein: 7.1 g/dL (ref 6.0–8.3)

## 2019-05-03 LAB — T4, FREE: Free T4: 1.09 ng/dL (ref 0.60–1.60)

## 2019-05-03 LAB — HEMOGLOBIN A1C: Hgb A1c MFr Bld: 6.8 % — ABNORMAL HIGH (ref 4.6–6.5)

## 2019-05-06 ENCOUNTER — Other Ambulatory Visit: Payer: Self-pay

## 2019-05-06 ENCOUNTER — Encounter: Payer: Self-pay | Admitting: Endocrinology

## 2019-05-06 ENCOUNTER — Ambulatory Visit (INDEPENDENT_AMBULATORY_CARE_PROVIDER_SITE_OTHER): Payer: Medicare Other | Admitting: Endocrinology

## 2019-05-06 VITALS — BP 130/60 | HR 60 | Ht 71.25 in | Wt 232.4 lb

## 2019-05-06 DIAGNOSIS — Z9861 Coronary angioplasty status: Secondary | ICD-10-CM | POA: Diagnosis not present

## 2019-05-06 DIAGNOSIS — I2510 Atherosclerotic heart disease of native coronary artery without angina pectoris: Secondary | ICD-10-CM

## 2019-05-06 DIAGNOSIS — E230 Hypopituitarism: Secondary | ICD-10-CM | POA: Diagnosis not present

## 2019-05-06 DIAGNOSIS — E669 Obesity, unspecified: Secondary | ICD-10-CM | POA: Diagnosis not present

## 2019-05-06 DIAGNOSIS — E1169 Type 2 diabetes mellitus with other specified complication: Secondary | ICD-10-CM

## 2019-05-06 NOTE — Patient Instructions (Addendum)
Pm insulin 8 units  Rotate injection sites

## 2019-05-06 NOTE — Progress Notes (Signed)
- Patient ID: Carl Gutierrez, male   DOB: 02-06-1946, 73 y.o.   MRN: [id number]           Reason for Appointment: Type II Diabetes follow-up   History of Present Illness    Recent history:     INSULIN regimen: NovoLog 70/30 insulin, 22 units before breakfast and 10 before dinner  Non-insulin hypoglycemic drugs: Janumet XR 100/1000 daily  Highest A1c on 4/20 was up to 13.0 and is now 6.8 %  Current management, blood sugar patterns and problems identified:  He has been checking blood sugars somewhat infrequently again  Although his fasting readings are quite normal he has somewhat variable readings later in the a.m. trending higher in the afternoon  No hypoglycemic symptoms when his glucose was 60 8 in the morning once  Has only a few readings after meals and cannot get a proper trend for readings in the evenings  He has gained a significant amount of weight  This is despite is trying to do a little walking  However he thinks that he is not watching his portions and snacks lately  He is asking about firm areas in his abdomen where he is doing his insulin Does not think he has missed his insulin doses    Side effects from medications: None       Monitors blood glucose:  Once or twice a day.    Glucometer:  One Touch           Blood Glucose readings from download   PRE-MEAL Fasting Lunch Dinner Bedtime Overall  Glucose range:  68-96  98 152, 177  135, 203   Mean/median:  88    122   POST-MEAL PC Breakfast PC Lunch PC Dinner  Glucose range:  174    Mean/median:      Previous readings:  PRE-MEAL Fasting Lunch Dinner Bedtime Overall  Glucose range:  76-104      Mean/median:  92     97   POST-MEAL PC Breakfast PC Lunch PC Dinner  Glucose range:  116   101-141  Mean/median:    120              Dietician visit: Most recent: 11/2012     Weight history   Wt Readings from Last 3 Encounters:  05/06/19 232 lb 6.4 oz (105.4 kg)  01/26/19 223 lb 9.6 oz (101.4 kg)   11/29/18 219 lb 6.4 oz (99.5 kg)            Diabetes labs:  Lab Results  Component Value Date   HGBA1C 6.8 (H) 05/03/2019   HGBA1C 7.0 (H) 01/24/2019   HGBA1C 13.0 (A) 10/07/2018   Lab Results  Component Value Date   MICROALBUR 2.1 (H) 06/17/2018   LDLCALC 55 11/23/2018   CREATININE 1.43 05/03/2019     Allergies as of 05/06/2019      Reactions   Hydrochlorothiazide    Leg and side pain with this   Other    Blood pressure med name unknown.      Medication List       Accurate as of May 06, 2019  9:30 AM. If you have any questions, ask your nurse or doctor.        amLODipine 10 MG tablet Commonly known as: NORVASC TAKE 1 TABLET BY MOUTH EVERY DAY   aspirin 81 MG tablet Take 81 mg by mouth daily. Reported on 09/05/2015   B-D UF III MINI PEN NEEDLES 31G X 5 MM  Misc Generic drug: Insulin Pen Needle USE FOR INSULIN PEN TWICE A DAY   glucose blood test strip Use OneTouch Verio test strips as instructed to check blood sugar once daily.DX:E11.65   hydrocortisone 20 MG tablet Commonly known as: CORTEF Take 10-20 mg by mouth 2 (two) times daily. Takes 1 tablet in the morning and 1/2 tablet at night   Janumet XR [phone] MG Tb24 Generic drug: SitaGLIPtin-MetFORMIN HCl TAKE 1 TABLET BY MOUTH EVERY DAY   levothyroxine 88 MCG tablet Commonly known as: SYNTHROID TAKE 1 TABLET BY MOUTH EVERY DAY   lisinopril 20 MG tablet Commonly known as: ZESTRIL TAKE 1 TABLET (20 MG TOTAL) BY MOUTH DAILY.   metoprolol succinate 25 MG 24 hr tablet Commonly known as: TOPROL-XL TAKE 1 TABLET BY MOUTH EVERY DAY   NovoLOG Mix 70/30 FlexPen (70-30) 100 UNIT/ML FlexPen Generic drug: insulin aspart protamine - aspart INSTILL 20 UNITS BEFORE BREAKFAST AND 14 BEFORE DINNER. (SWITCHING FROM HUMALOG DUE TO INS.) What changed: additional instructions   rosuvastatin 40 MG tablet Commonly known as: CRESTOR TAKE 1 TABLET BY MOUTH EVERY DAY       Allergies:  Allergies  Allergen  Reactions  . Hydrochlorothiazide     Leg and side pain with this  . Other     Blood pressure med name unknown.    Past Medical History:  Diagnosis Date  . CAD S/P percutaneous coronary angioplasty February 2004; 2008   PCI-dLAD: 2.25 mm x 16 mm Express BMS; Ramus- 2.75 mm x 18 mm Cypher DES (2.8 mm); follow-up 11/'08: Patent LAD/ramus stents. 90% small OM. EF 50-60%.; Myoview 1/'12: Exercise 9 min, 10 METS with no ischemia/infarction.   . Clotting disorder ([place])   . Diabetes mellitus    Low-dose metformin  . DJD (degenerative joint disease), lumbosacral     status post L4-L5 surgery  . Essential hypertension   . History of DVT of lower extremity     postoperative  . Hyperlipidemia with target LDL less than 70   . Hypothyroidism    Synthroid  . Panhypopituitarism ([place])    Status post pituitary adenoma removal in 2012  . Recurrent deep vein thrombosis (DVT) ([place]) 10/01/2015    Past Surgical History:  Procedure Laterality Date  . BRAIN SURGERY  2012   Removal of pituitary adenoma  . CARDIAC CATHETERIZATION  November 2008   Significant LAD tapering but no significant disease the distal stent. Patent Ramus stent. 90% lesion in small OM 2; small nondominant RCA. Medical therapy. EF 50-60%.  . CORONARY ANGIOPLASTY WITH STENT PLACEMENT  February 2004   PCI-dLAD: 2.25 mm x 16 mm BMS;;PCI- RI: 2.75 mm 18 mm Cypher DES  . NM MYOVIEW LTD  January 2012   Exercise ~9 min - 10 METS; no ischemia or infarction  . PROSTATECTOMY N/A 08/13/2015   Procedure: TRANSVESICLE OPEN PROSTATECTOMY** ;  Surgeon: Carolan Clines, MD;  Location: [facility];  Service: Urology;  Laterality: N/A;  . SPINE SURGERY     L4-L5    Family History  Problem Relation Age of Onset  . Cancer Mother        pancreatic ca    Social History:  reports that he quit smoking about 25 years ago. He has never used smokeless tobacco. He reports that he does not drink alcohol or use drugs.  Review of Systems:  Hypertension:   He is taking amlodipine and lisinopril as well as 25 mg metoprolol prescribed by his cardiologist Does check at home occasionally, recently  readings are 130-135/75   BP Readings from Last 3 Encounters:  05/06/19 130/60  01/26/19 (!) 142/80  11/29/18 140/82   Renal function history:  Lab Results  Component Value Date   CREATININE 1.43 05/03/2019   CREATININE 1.34 01/24/2019   CREATININE 1.46 11/29/2018     HYPOPITUITARISM: He has been taking levothyroxine and Cortef as before Takes Cortef as directed both morning and evening consistently No lightheadedness recently  Free T4 has been consistently normal with current dosage of 88 mcg levothyroxine  Lab Results  Component Value Date   TSH 2.960 11/23/2018   TSH 4.09 01/29/2018   TSH 1.51 05/23/2016   FREET4 1.09 05/03/2019   FREET4 1.24 10/26/2018   FREET4 1.17 06/17/2018       LABS:  Lab on 05/03/2019  Component Date Value Ref Range Status  . Free T4 05/03/2019 1.09  0.60 - 1.60 ng/dL Final   Comment: Specimens from patients who are undergoing biotin therapy and /or ingesting biotin supplements may contain high levels of biotin.  The higher biotin concentration in these specimens interferes with this Free T4 assay.  Specimens that contain high levels  of biotin may cause false high results for this Free T4 assay.  Please interpret results in light of the total clinical presentation of the patient.    . Sodium 05/03/2019 141  135 - 145 mEq/L Final  . Potassium 05/03/2019 4.3  3.5 - 5.1 mEq/L Final  . Chloride 05/03/2019 106  96 - 112 mEq/L Final  . CO2 05/03/2019 27  19 - 32 mEq/L Final  . Glucose, Bld 05/03/2019 106* 70 - 99 mg/dL Final  . BUN 05/03/2019 20  6 - 23 mg/dL Final  . Creatinine, Ser 05/03/2019 1.43  0.40 - 1.50 mg/dL Final  . Total Bilirubin 05/03/2019 0.3  0.2 - 1.2 mg/dL Final  . Alkaline Phosphatase 05/03/2019 75  39 - 117 U/L Final  . AST 05/03/2019 13  0 - 37 U/L Final  . ALT 05/03/2019 11  0 - 53  U/L Final  . Total Protein 05/03/2019 7.1  6.0 - 8.3 g/dL Final  . Albumin 05/03/2019 4.2  3.5 - 5.2 g/dL Final  . GFR 05/03/2019 58.27* >60.00 mL/min Final  . Calcium 05/03/2019 9.2  8.4 - 10.5 mg/dL Final  . Hgb A1c MFr Bld 05/03/2019 6.8* 4.6 - 6.5 % Final   Glycemic Control Guidelines for People with Diabetes:Non Diabetic:  <6%Goal of Therapy: <7%Additional Action Suggested:  >8%      Examination:   BP 130/60 (BP Location: Left Arm, Patient Position: Sitting, Cuff Size: Large)   Pulse 60   Ht 5' 11.25" (1.81 m)   Wt 232 lb 6.4 oz (105.4 kg)   SpO2 96%   BMI 32.19 kg/m   Body mass index is 32.19 kg/m.    ASSESSMENT/ PLAN:    Diabetes type 2:   See history of present illness for detailed discussion of current diabetes management, blood sugar patterns and problems identified  He is on premixed insulin twice a day started when he had marked hyperglycemia in 09/2018  A1c is 6.8, previously 7%  Although his fasting blood sugars at home are excellent without significant hypoglycemia his blood sugar may be relatively higher at times after lunch or dinner This is likely related to recently getting larger portions or snacks He is also gaining weight With his knee pain he has only been walking limited amount   RENAL dysfunction: Stable   HYPERTENSION: Followed by PCP,  blood pressure normal  PLAN:  He will reduce evening dose to 8 units  Discussed that since he is still needing at least 20 units in the morning and may have relatively high readings at times he will need to continue insulin  He will also stay on Janumet for improving insulin sensitivity  Encouraged him to cut back on snacking and portions to help keep his weight from going up  Needs to try and walk more regularly  Asked him to rotate his insulin injection sites to more lateral areas  Continue Cortef and levothyroxine unchanged  There are no Patient Instructions on file for this visit.     Elayne Snare  05/06/2019, 9:30 AM

## 2019-05-08 ENCOUNTER — Other Ambulatory Visit: Payer: Self-pay | Admitting: Endocrinology

## 2019-05-16 ENCOUNTER — Other Ambulatory Visit: Payer: Self-pay | Admitting: Family Medicine

## 2019-05-16 ENCOUNTER — Other Ambulatory Visit: Payer: Self-pay | Admitting: Endocrinology

## 2019-05-16 DIAGNOSIS — E039 Hypothyroidism, unspecified: Secondary | ICD-10-CM

## 2019-05-25 ENCOUNTER — Ambulatory Visit: Payer: Medicare Other | Admitting: Family Medicine

## 2019-05-30 ENCOUNTER — Other Ambulatory Visit: Payer: Self-pay

## 2019-05-30 ENCOUNTER — Ambulatory Visit (INDEPENDENT_AMBULATORY_CARE_PROVIDER_SITE_OTHER): Payer: Medicare Other | Admitting: Family Medicine

## 2019-05-30 ENCOUNTER — Encounter: Payer: Self-pay | Admitting: Family Medicine

## 2019-05-30 VITALS — BP 140/79 | HR 64 | Temp 97.7°F | Wt 234.2 lb

## 2019-05-30 DIAGNOSIS — E1165 Type 2 diabetes mellitus with hyperglycemia: Secondary | ICD-10-CM | POA: Diagnosis not present

## 2019-05-30 DIAGNOSIS — E785 Hyperlipidemia, unspecified: Secondary | ICD-10-CM

## 2019-05-30 DIAGNOSIS — Z794 Long term (current) use of insulin: Secondary | ICD-10-CM | POA: Diagnosis not present

## 2019-05-30 DIAGNOSIS — I10 Essential (primary) hypertension: Secondary | ICD-10-CM

## 2019-05-30 DIAGNOSIS — Z9861 Coronary angioplasty status: Secondary | ICD-10-CM

## 2019-05-30 DIAGNOSIS — E039 Hypothyroidism, unspecified: Secondary | ICD-10-CM | POA: Diagnosis not present

## 2019-05-30 DIAGNOSIS — I2510 Atherosclerotic heart disease of native coronary artery without angina pectoris: Secondary | ICD-10-CM | POA: Diagnosis not present

## 2019-05-30 MED ORDER — LEVOTHYROXINE SODIUM 88 MCG PO TABS: 88.0000 ug | ORAL_TABLET | Freq: Every day | ORAL | 0 refills | Status: DC

## 2019-05-30 NOTE — Patient Instructions (Addendum)
  No labs today, but keep appt with cardiology and cholesterol testing at that time if possible. No changes in meds for now.    If you have lab work done today you will be contacted with your lab results within the next 2 weeks.  If you have not heard from [place] then please contact us. The fastest way to get your results is to register for My Chart.   IF you received an x-ray today, you will receive an invoice from [facility]. Please contact [facility] at [phone] with questions or concerns regarding your invoice.   IF you received labwork today, you will receive an invoice from [facility]. Please contact [facility] at [phone] with questions or concerns regarding your invoice.   Our billing staff will not be able to assist you with questions regarding bills from these companies.  You will be contacted with the lab results as soon as they are available. The fastest way to get your results is to activate your My Chart account. Instructions are located on the last page of this paperwork. If you have not heard from [place] regarding the results in 2 weeks, please contact this office.

## 2019-05-30 NOTE — Progress Notes (Signed)
Subjective:  Patient ID: [name], male    DOB: [birth year]  Age: 73 y.o. MRN: [id number]  CC:  Chief Complaint  Patient presents with  . Follow-up    45mos F/U    HPI [facility] presents for   Hyperlipidemia: With history of CAD, Cardiology Dr. Ellyn Hack. appt 08/02/18.  On ASA and statin - crestor 40mg  qd. No new myalgias/side effects.  Fasting today.   Lab Results  Component Value Date   CHOL 125 11/23/2018   HDL 49 11/23/2018   LDLCALC 55 11/23/2018   LDLDIRECT 85.0 05/18/2014   TRIG 106 11/23/2018   CHOLHDL 2.6 11/23/2018   Lab Results  Component Value Date   ALT 11 05/03/2019   AST 13 05/03/2019   ALKPHOS 75 05/03/2019   BILITOT 0.3 05/03/2019    Hypertension: Lisinopril 20 mg daily, Toprol XL 25 mg daily, amlodipine 10 mg daily. Home readings: up and down. 130/50-60. No new side effects.  BP Readings from Last 3 Encounters:  05/30/19 140/79  05/06/19 130/60  01/26/19 (!) 142/80   Lab Results  Component Value Date   CREATININE 1.43 05/03/2019    Hypothyroidism: Lab Results  Component Value Date   TSH 2.960 11/23/2018  free t4 1.09 on 11/17.  Taking medication daily. Synthroid 74mcg.  No new hot or cold intolerance. No new hair or skin changes, heart palpitations or new fatigue. No new weight changes.   Insulin-dependent diabetes.  followed by Dr. Dwyane Dee with endocrinology.  Last appointment November 20. Has appt with endocrine in March, cardiology in February.  Lab Results  Component Value Date   HGBA1C 6.8 (H) 05/03/2019     Followed by urology, Dr. Lovena Neighbours, for BPH in past.  History Patient Active Problem List   Diagnosis Date Noted  . Anemia in chronic kidney disease 11/30/2015  . Recurrent deep vein thrombosis (DVT) ([place]) 10/01/2015  . Hematuria, unspecified 10/01/2015  . Elevated serum creatinine 10/01/2015  . BPH (benign prostatic hyperplasia) 08/13/2015  . Benign localized hyperplasia of prostate with urinary obstruction  07/23/2015  . Preop cardiovascular exam 07/17/2015  . Encounter for CDL (commercial driving license) exam [id number]  . Obesity (BMI 30-39.9) 06/26/2013  . Essential hypertension 06/26/2013  . Panhypopituitarism ([place]) 11/17/2011  .   11/17/2011  . Diabetes mellitus ([place]) 11/17/2011  . Hyperlipidemia LDL goal <70 11/17/2011  . CAD S/P percutaneous coronary angioplasty 07/19/2002   Past Medical History:  Diagnosis Date  . CAD S/P percutaneous coronary angioplasty February 2004; 2008   PCI-dLAD: 2.25 mm x 16 mm Express BMS; Ramus- 2.75 mm x 18 mm Cypher DES (2.8 mm); follow-up 11/'08: Patent LAD/ramus stents. 90% small OM. EF 50-60%.; Myoview 1/'12: Exercise 9 min, 10 METS with no ischemia/infarction.   . Clotting disorder ([place])   . Diabetes mellitus    Low-dose metformin  . DJD (degenerative joint disease), lumbosacral     status post L4-L5 surgery  . Essential hypertension   . History of DVT of lower extremity     postoperative  . Hyperlipidemia with target LDL less than 70   . Hypothyroidism    Synthroid  . Panhypopituitarism ([place])    Status post pituitary adenoma removal in 2012  . Recurrent deep vein thrombosis (DVT) ([place]) 10/01/2015   Past Surgical History:  Procedure Laterality Date  . BRAIN SURGERY  2012   Removal of pituitary adenoma  . CARDIAC CATHETERIZATION  November 2008   Significant LAD tapering but no significant disease the distal stent. Patent Ramus  stent. 90% lesion in small OM 2; small nondominant RCA. Medical therapy. EF 50-60%.  . CORONARY ANGIOPLASTY WITH STENT PLACEMENT  February 2004   PCI-dLAD: 2.25 mm x 16 mm BMS;;PCI- RI: 2.75 mm 18 mm Cypher DES  . NM MYOVIEW LTD  January 2012   Exercise ~9 min - 10 METS; no ischemia or infarction  . PROSTATECTOMY N/A 08/13/2015   Procedure: TRANSVESICLE OPEN PROSTATECTOMY** ;  Surgeon: Carolan Clines, MD;  Location: [facility];  Service: Urology;  Laterality: N/A;  . SPINE SURGERY     L4-L5   Allergies  Allergen  Reactions  . Hydrochlorothiazide     Leg and side pain with this  . Other     Blood pressure med name unknown.   Prior to Admission medications   Medication Sig Start Date End Date Taking? Authorizing Provider  amLODipine (NORVASC) 10 MG tablet TAKE 1 TABLET BY MOUTH EVERY DAY 09/08/18  Yes Leonie Man, MD  aspirin 81 MG tablet Take 81 mg by mouth daily. Reported on 09/05/2015   Yes [provider]  B-D UF III MINI PEN NEEDLES 31G X 5 MM MISC USE FOR INSULIN PEN TWICE A DAY 05/16/19  Yes Elayne Snare, MD  glucose blood test strip Use OneTouch Verio test strips as instructed to check blood sugar once daily.DX:E11.65 11/29/18  Yes Elayne Snare, MD  hydrocortisone (CORTEF) 20 MG tablet Take 10-20 mg by mouth 2 (two) times daily. Takes 1 tablet in the morning and 1/2 tablet at night   Yes [provider]  insulin aspart protamine - aspart (NOVOLOG MIX 70/30 FLEXPEN) (70-30) 100 UNIT/ML FlexPen INJECT 20 UNITS BEFORE BREAKFAST AND 8 BEFORE DINNER. 05/09/19  Yes Elayne Snare, MD  JANUMET XR [phone] MG TB24 TAKE 1 TABLET BY MOUTH EVERY DAY 02/14/19  Yes Elayne Snare, MD  levothyroxine (SYNTHROID) 88 MCG tablet TAKE 1 TABLET BY MOUTH EVERY DAY 05/17/19  Yes Wendie Agreste, MD  lisinopril (PRINIVIL,ZESTRIL) 20 MG tablet TAKE 1 TABLET (20 MG TOTAL) BY MOUTH DAILY. 07/24/16  Yes Leonie Man, MD  metoprolol succinate (TOPROL-XL) 25 MG 24 hr tablet TAKE 1 TABLET BY MOUTH EVERY DAY 09/08/18  Yes Leonie Man, MD  rosuvastatin (CRESTOR) 40 MG tablet TAKE 1 TABLET BY MOUTH EVERY DAY 01/21/19  Yes Leonie Man, MD  testosterone cypionate (DEPO-TESTOSTERONE) 200 MG/ML injection Inject 1.5 mLs (300 mg total) into the muscle every 21 ( twenty-one) days. 11/23/14 03/14/15  Elayne Snare, MD   Social History   Socioeconomic History  . Marital status: Married    Spouse name: Not on file  . Number of children: Not on file  . Years of education: Not on file  . Highest education level: Not on  file  Occupational History  . Not on file  Tobacco Use  . Smoking status: Former Smoker    Quit date: 06/24/1993    Years since quitting: 25.9  . Smokeless tobacco: Never Used  Substance and Sexual Activity  . Alcohol use: No  . Drug use: No  . 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 Not on file  Social History Narrative   He is a married father of 2, grandfather 39. Exercises only occasionally. Not as much as he desires 2. 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   He does not smoke cigarettes -- quit in 1995. Does not drink  alcohol.   Social Determinants of Health   Financial Resource Strain:   . Difficulty of Paying Living Expenses: Not on file  Food Insecurity:   . Worried About Charity fundraiser in the Last Year: Not on file  . Ran Out of Food in the Last Year: Not on file  Transportation Needs:   . Lack of Transportation (Medical): Not on file  . Lack of Transportation (Non-Medical): Not on file  Physical Activity:   . Days of Exercise per Week: Not on file  . Minutes of Exercise per Session: Not on file  Stress:   . Feeling of Stress : Not on file  Social Connections:   . Frequency of Communication with Friends and Family: Not on file  . Frequency of Social Gatherings with Friends and Family: Not on file  . Attends Religious Services: Not on file  . Active Member of Clubs or Organizations: Not on file  . Attends Archivist Meetings: Not on file  . Marital Status: Not on file  Intimate Partner Violence:   . Fear of Current or Ex-Partner: Not on file  . Emotionally Abused: Not on file  . Physically Abused: Not on file  . Sexually Abused: Not on file    Review of Systems  Constitutional: Negative for fatigue and unexpected weight change.  Eyes: Negative for visual disturbance.  Respiratory: Negative for cough, chest tightness and shortness of breath.   Cardiovascular:  Negative for chest pain, palpitations and leg swelling.  Gastrointestinal: Negative for abdominal pain and blood in stool.  Neurological: Negative for dizziness, light-headedness and headaches.     Objective:   Vitals:   05/30/19 0828  BP: 140/79  Pulse: 64  Temp: 97.7 F (36.5 C)  TempSrc: Temporal  SpO2: 98%  Weight: 234 lb 3.2 oz (106.2 kg)     Physical Exam Vitals reviewed.  Constitutional:      Appearance: He is well-developed.  HENT:     Head: Normocephalic and atraumatic.  Eyes:     Pupils: Pupils are equal, round, and reactive to light.  Neck:     Vascular: No carotid bruit or JVD.  Cardiovascular:     Rate and Rhythm: Normal rate and regular rhythm.     Heart sounds: Normal heart sounds. No murmur.  Pulmonary:     Effort: Pulmonary effort is normal.     Breath sounds: Normal breath sounds. No rales.  Skin:    General: Skin is warm and dry.  Neurological:     Mental Status: He is alert and oriented to person, place, and time.        Assessment & Plan:  Jerrie Erling is a 73 y.o. male . Type 2 diabetes mellitus with hyperglycemia, with long-term current use of insulin ([place]) - Plan: HM Diabetes Foot Exam  -Improving control, followed by endocrinology.  Continue routine follow-up.  No med changes  Hypothyroidism - Plan: levothyroxine (SYNTHROID) 88 MCG tablet  -Stable free T4 recently.  Labs deferred.  Continue Synthroid.  Hyperlipidemia, unspecified hyperlipidemia type  -Tolerating Crestor at current dose.  Continue same.  Cardiology follow-up in February.  Recommended lipid panel at that time or I can place lab only visit if needed  Essential hypertension  - Borderline but overall stable based on home readings.  No changes for now.  Continue to monitor  Meds ordered this encounter  Medications  . levothyroxine (SYNTHROID) 88 MCG tablet    Sig: Take 1 tablet (88 mcg total) by  mouth daily.    Dispense:  90 tablet    Refill:  0   Patient  Instructions    No labs today, but keep appt with cardiology and cholesterol testing at that time if possible. No changes in meds for now.    If you have lab work done today you will be contacted with your lab results within the next 2 weeks.  If you have not heard from [place] then please contact us. The fastest way to get your results is to register for My Chart.   IF you received an x-ray today, you will receive an invoice from [facility]. Please contact [facility] at [phone] with questions or concerns regarding your invoice.   IF you received labwork today, you will receive an invoice from [facility]. Please contact [facility] at [phone] with questions or concerns regarding your invoice.   Our billing staff will not be able to assist you with questions regarding bills from these companies.  You will be contacted with the lab results as soon as they are available. The fastest way to get your results is to activate your My Chart account. Instructions are located on the last page of this paperwork. If you have not heard from [place] regarding the results in 2 weeks, please contact this office.         Signed, [name], MD [facility]

## 2019-06-27 ENCOUNTER — Other Ambulatory Visit: Payer: Self-pay | Admitting: Endocrinology

## 2019-07-11 ENCOUNTER — Other Ambulatory Visit: Payer: Self-pay

## 2019-07-11 ENCOUNTER — Telehealth: Payer: Self-pay | Admitting: Endocrinology

## 2019-07-11 MED ORDER — INSULIN LISPRO PROT & LISPRO (75-25 MIX) 100 UNIT/ML KWIKPEN: PEN_INJECTOR | SUBCUTANEOUS | 2 refills | Status: DC

## 2019-07-11 NOTE — Telephone Encounter (Signed)
Ok to change?  If so, does the dose need to be the same?

## 2019-07-11 NOTE — Telephone Encounter (Signed)
New Rx sent.

## 2019-07-11 NOTE — Telephone Encounter (Signed)
Yes and the dose needs to be the same

## 2019-07-11 NOTE — Telephone Encounter (Signed)
Patient has changed insurance and per new insurance the Novolog 70/30 is no longer covered.  Insurance has advise that the will cover Humalog 75/25.  Patient would like to know if this is OK, please call to advise  [facility] using [facility]

## 2019-07-14 ENCOUNTER — Ambulatory Visit: Payer: Medicare Other

## 2019-07-21 ENCOUNTER — Ambulatory Visit: Payer: Medicare Other

## 2019-07-31 ENCOUNTER — Ambulatory Visit: Payer: Medicare Other

## 2019-08-03 ENCOUNTER — Ambulatory Visit (INDEPENDENT_AMBULATORY_CARE_PROVIDER_SITE_OTHER): Payer: Medicare Other | Admitting: Cardiology

## 2019-08-03 ENCOUNTER — Encounter: Payer: Self-pay | Admitting: Cardiology

## 2019-08-03 ENCOUNTER — Other Ambulatory Visit: Payer: Self-pay

## 2019-08-03 VITALS — BP 110/64 | HR 67 | Temp 96.1°F | Ht 71.25 in | Wt 235.0 lb

## 2019-08-03 DIAGNOSIS — Z9861 Coronary angioplasty status: Secondary | ICD-10-CM

## 2019-08-03 DIAGNOSIS — E669 Obesity, unspecified: Secondary | ICD-10-CM | POA: Diagnosis not present

## 2019-08-03 DIAGNOSIS — I2510 Atherosclerotic heart disease of native coronary artery without angina pectoris: Secondary | ICD-10-CM | POA: Diagnosis not present

## 2019-08-03 DIAGNOSIS — I10 Essential (primary) hypertension: Secondary | ICD-10-CM

## 2019-08-03 DIAGNOSIS — E785 Hyperlipidemia, unspecified: Secondary | ICD-10-CM | POA: Diagnosis not present

## 2019-08-03 NOTE — Patient Instructions (Signed)
Medication Instructions:  No changes *If you need a refill on your cardiac medications before your next appointment, please call your pharmacy*  Lab Work: Not needed  Testing/Procedures: Not needed  Follow-Up: At [facility], you and your health needs are our priority.  As part of our continuing mission to provide you with exceptional heart care, we have created designated Provider Care Teams.  These Care Teams include your primary Cardiologist (physician) and Advanced Practice Providers (APPs -  Physician Assistants and Nurse Practitioners) who all work together to provide you with the care you need, when you need it.  Your next appointment:   12 month(s)-  Feb 2022  The format for your next appointment:   In Person  Provider:   Glenetta Hew, MD  Other Instructions n/a

## 2019-08-03 NOTE — Progress Notes (Signed)
Primary Care Provider: Wendie Agreste, MD Cardiologist: Glenetta Hew, MD Electrophysiologist: None  Clinic Note: Chief Complaint  Patient presents with  . Follow-up    12 months.  . Coronary Artery Disease    No angina     HPI:    Carl Gutierrez is a 74 y.o. male with a PMH notable for CAD having PCI as well as other cardiac risk factors, who presents today for annual follow-up. Has a history of pan hypopituitarism.  He is a former Chief Executive Officer - retired ~ 2 yrs ago.  Lucifer Shirkey was last seen on August 02, 2018.  Was doing well overall.  Getting more walking in.  Limited by knee and ankle pain.  Tolerating simvastatin.  Recent Hospitalizations: None   Reviewed  CV studies:    The following studies were reviewed today: (if available, images/films reviewed: From Epic Chart or Care Everywhere) . None:   Interval History:   Carl Gutierrez returns here today along with his wife for annual follow-up stating that he is doing very well.  He really is only bothered by pain in his left knee that limits his walking.  He has some swelling associated with it but is not associated with any leg swelling.  He tries to get some activity with walking he and his wife walk just about every day.  He also does some other exercises in the house.  He is hoping that as the weather improves, and has the COVID-19 restrictions diminished, that he can get back into doing more activities.  They like to travel and and get out of the house.  He is getting somewhat bored.  No cardiac symptoms of angina or heart failure.  No arrhythmia symptoms.  CV Review of Symptoms (Summary): no chest pain or dyspnea on exertion negative for - edema, irregular heartbeat, orthopnea, palpitations, paroxysmal nocturnal dyspnea, rapid heart rate, shortness of breath or Syncope/near syncope, TIA/amaurosis fugax, claudication  The patient does not have symptoms concerning for COVID-19 infection (fever, chills,  cough, or new shortness of breath).  The patient is practicing social distancing & Masking.  He has had his first COVID-19 vaccine and is on the list to get a second.  He and his wife actually went to [facility] to get the vaccines--[place]   REVIEWED OF SYSTEMS   Review of Systems  HENT: Negative for congestion and nosebleeds.   Respiratory: Negative for shortness of breath.   Cardiovascular: Positive for leg swelling (Left knee).  Gastrointestinal: Negative for blood in stool and melena.  Genitourinary: Negative for hematuria.  Musculoskeletal: Positive for joint pain (Left knee).  Neurological: Negative for dizziness and headaches.  Psychiatric/Behavioral: Negative for memory loss.   Joint pain-knees and ankles -- mostly OK - L nee is worst. , thigh myalgias better.  I have reviewed and (if needed) personally updated the patient's problem list, medications, allergies, past medical and surgical history, social and family history.   PAST MEDICAL HISTORY   Past Medical History:  Diagnosis Date  . CAD S/P percutaneous coronary angioplasty February 2004; 2008   PCI-dLAD: 2.25 mm x 16 mm Express BMS; Ramus- 2.75 mm x 18 mm Cypher DES (2.8 mm); follow-up 11/'08: Patent LAD/ramus stents. 90% small OM. EF 50-60%.; Myoview 1/'12: Exercise 9 min, 10 METS with no ischemia/infarction.   . Clotting disorder ([place])   . Diabetes mellitus    Low-dose metformin  . DJD (degenerative joint disease), lumbosacral     status post L4-L5 surgery  . Essential  hypertension   . History of DVT of lower extremity     postoperative  . Hyperlipidemia with target LDL less than 70   . Hypothyroidism    Synthroid  . Panhypopituitarism ([place])    Status post pituitary adenoma removal in 2012  . Recurrent deep vein thrombosis (DVT) ([place]) 10/01/2015    PAST SURGICAL HISTORY   Past Surgical History:  Procedure Laterality Date  . BRAIN SURGERY  2012   Removal of pituitary adenoma  . CARDIAC  CATHETERIZATION  November 2008   Significant LAD tapering but no significant disease the distal stent. Patent Ramus stent. 90% lesion in small OM 2; small nondominant RCA. Medical therapy. EF 50-60%.  . CORONARY ANGIOPLASTY WITH STENT PLACEMENT  February 2004   PCI-dLAD: 2.25 mm x 16 mm BMS;;PCI- RI: 2.75 mm 18 mm Cypher DES  . NM MYOVIEW LTD  January 2012   Exercise ~9 min - 10 METS; no ischemia or infarction  . PROSTATECTOMY N/A 08/13/2015   Procedure: TRANSVESICLE OPEN PROSTATECTOMY** ;  Surgeon: Carolan Clines, MD;  Location: [facility];  Service: Urology;  Laterality: N/A;  . SPINE SURGERY     L4-L5    MEDICATIONS/ALLERGIES   Current Meds  Medication Sig  . amLODipine (NORVASC) 10 MG tablet TAKE 1 TABLET BY MOUTH EVERY DAY  . aspirin 81 MG tablet Take 81 mg by mouth daily. Reported on 09/05/2015  . B-D UF III MINI PEN NEEDLES 31G X 5 MM MISC USE FOR INSULIN PEN TWICE A DAY  . glucose blood test strip Use OneTouch Verio test strips as instructed to check blood sugar once daily.DX:E11.65  . hydrocortisone (CORTEF) 20 MG tablet Take 10-20 mg by mouth 2 (two) times daily. Takes 1 tablet in the morning and 1/2 tablet at night  . Insulin Lispro Prot & Lispro (HUMALOG MIX 75/25 KWIKPEN) (75-25) 100 UNIT/ML Kwikpen Inject 20 units under the skin before breakfast and 8 units before supper. (Replaces 70/30)  . JANUMET XR [phone] MG TB24 TAKE 1 TABLET BY MOUTH EVERY DAY  . levothyroxine (SYNTHROID) 88 MCG tablet Take 1 tablet (88 mcg total) by mouth daily.  Marland Kitchen lisinopril (PRINIVIL,ZESTRIL) 20 MG tablet TAKE 1 TABLET (20 MG TOTAL) BY MOUTH DAILY.  . metoprolol succinate (TOPROL-XL) 25 MG 24 hr tablet TAKE 1 TABLET BY MOUTH EVERY DAY  . rosuvastatin (CRESTOR) 40 MG tablet TAKE 1 TABLET BY MOUTH EVERY DAY   ~ on Insulin x ~7 months  Allergies  Allergen Reactions  . Hydrochlorothiazide     Leg and side pain with this  . Other     Blood pressure med name unknown.    SOCIAL HISTORY/FAMILY  HISTORY   Reviewed in Epic:  Pertinent findings: n/a -- ~ 2 yrs out from retirement.   OBJCTIVE -PE, EKG, labs   Wt Readings from Last 3 Encounters:  08/03/19 235 lb (106.6 kg)  05/30/19 234 lb 3.2 oz (106.2 kg)  05/06/19 232 lb 6.4 oz (105.4 kg)  February 2020: 237 pounds  Physical Exam: BP 110/64 (BP Location: Left Arm, Patient Position: Sitting, Cuff Size: Large)   Pulse 67   Temp (!) 96.1 F (35.6 C)   Ht 5' 11.25" (1.81 m)   Wt 235 lb (106.6 kg)   BMI 32.55 kg/m  Physical Exam  Constitutional: He appears well-developed and well-nourished. No distress.  Healthy-appearing.  Well-groomed.  Appears younger than stated age  HENT:  Head: Normocephalic and atraumatic.  Neck: No hepatojugular reflux and no JVD present. Carotid  bruit is not present.  Cardiovascular: Normal rate, regular rhythm, normal heart sounds and normal pulses.  No extrasystoles are present. PMI is not displaced. Exam reveals no gallop and no friction rub.  No murmur heard. Musculoskeletal:     Cervical back: Normal range of motion and neck supple.  Vitals reviewed.     Adult ECG Report  Rate: 67;  Rhythm: normal sinus rhythm and First-degree block.  Otherwise normal axis, intervals and durations.;   Narrative Interpretation: Stable EKG   Recent Labs:    Lab Results  Component Value Date   CHOL 125 11/23/2018   HDL 49 11/23/2018   LDLCALC 55 11/23/2018   LDLDIRECT 85.0 05/18/2014   TRIG 106 11/23/2018   CHOLHDL 2.6 11/23/2018   Lab Results  Component Value Date   CREATININE 1.43 05/03/2019   BUN 20 05/03/2019   NA 141 05/03/2019   K 4.3 05/03/2019   CL 106 05/03/2019   CO2 27 05/03/2019   Lab Results  Component Value Date   TSH 2.960 11/23/2018    ASSESSMENT/PLAN    Problem List Items Addressed This Visit    CAD S/P percutaneous coronary angioplasty - Primary (Chronic)    Distant history of two-vessel PCI in 2004 and 2008.  Has not had any recurrent anginal symptoms since.  Is now  active, with walking routinely as long as his knee does not keep him from doing so.  Continue current medications:  For antianginal benefit he is on amlodipine and Toprol.  He is on a stable dose of rosuvastatin and aspirin.  He is on an ACE inhibitor along with Januvia and Metformin combination      Relevant Orders   EKG 12-Lead (Completed)   Hyperlipidemia LDL goal <70 (Chronic)    Most recent lipids show excellent control with LDL of 55 on current dose of rosuvastatin..  Actually seems to have had an improvement having switched from simvastatin to rosuvastatin to avoid interactions with amlodipine.      Obesity (BMI 30-39.9) (Chronic)    He has not really lost any weight, but hopefully with his continued exercise, he will build to lose a few pounds.  We also talked dietary moderation.      Essential hypertension (Chronic)    Blood pressure looks really good today on current dose of amlodipine, lisinopril and metoprolol succinate.  Continue current dosing for now.          COVID-19 Education: The signs and symptoms of COVID-19 were discussed with the patient and how to seek care for testing (follow up with PCP or arrange E-visit).   The importance of social distancing was discussed today.  I spent a total of 12 minutes with the patient. >  50% of the time was spent in direct patient consultation.  Additional time spent with chart review  / charting (studies, outside notes, etc):5 Total Time: 17 min   Current medicines are reviewed at length with the patient today.  (+/- concerns) n/a   Patient Instructions / Medication Changes & Studies & Tests Ordered   Patient Instructions  Medication Instructions:  No changes *If you need a refill on your cardiac medications before your next appointment, please call your pharmacy*  Lab Work: Not needed  Testing/Procedures: Not needed  Follow-Up: At [facility], you and your health needs are our priority.  As part of  our continuing mission to provide you with exceptional heart care, we have created designated Provider Care Teams.  These Care Teams include  your primary Cardiologist (physician) and Advanced Practice Providers (APPs -  Physician Assistants and Nurse Practitioners) who all work together to provide you with the care you need, when you need it.  Your next appointment:   12 month(s)-  Feb 2022  The format for your next appointment:   In Person  Provider:   Glenetta Hew, MD  Other Instructions n/a    Studies Ordered:   Orders Placed This Encounter  Procedures  . EKG 12-Lead     Glenetta Hew, M.D., M.S. Interventional Cardiologist   Pager # [phone] Phone # [phone] [address] [place], [place] [address]   Thank you for choosing Heartcare at [facility]!!

## 2019-08-05 ENCOUNTER — Encounter: Payer: Self-pay | Admitting: Cardiology

## 2019-08-05 NOTE — Assessment & Plan Note (Signed)
He has not really lost any weight, but hopefully with his continued exercise, he will build to lose a few pounds.  We also talked dietary moderation.

## 2019-08-05 NOTE — Assessment & Plan Note (Addendum)
Distant history of two-vessel PCI in 2004 and 2008.  Has not had any recurrent anginal symptoms since.  Is now active, with walking routinely as long as his knee does not keep him from doing so.  Continue current medications:  For antianginal benefit he is on amlodipine and Toprol.  He is on a stable dose of rosuvastatin and aspirin.  He is on an ACE inhibitor along with Januvia and Metformin combination

## 2019-08-05 NOTE — Assessment & Plan Note (Signed)
Blood pressure looks really good today on current dose of amlodipine, lisinopril and metoprolol succinate.  Continue current dosing for now.

## 2019-08-05 NOTE — Assessment & Plan Note (Signed)
Most recent lipids show excellent control with LDL of 55 on current dose of rosuvastatin..  Actually seems to have had an improvement having switched from simvastatin to rosuvastatin to avoid interactions with amlodipine.

## 2019-08-10 ENCOUNTER — Other Ambulatory Visit (INDEPENDENT_AMBULATORY_CARE_PROVIDER_SITE_OTHER): Payer: Medicare Other

## 2019-08-10 ENCOUNTER — Other Ambulatory Visit: Payer: Self-pay

## 2019-08-10 DIAGNOSIS — E1169 Type 2 diabetes mellitus with other specified complication: Secondary | ICD-10-CM

## 2019-08-10 DIAGNOSIS — E669 Obesity, unspecified: Secondary | ICD-10-CM | POA: Diagnosis not present

## 2019-08-10 DIAGNOSIS — E230 Hypopituitarism: Secondary | ICD-10-CM | POA: Diagnosis not present

## 2019-08-10 LAB — COMPREHENSIVE METABOLIC PANEL
ALT: 12 U/L (ref 0–53)
AST: 12 U/L (ref 0–37)
Albumin: 4 g/dL (ref 3.5–5.2)
Alkaline Phosphatase: 69 U/L (ref 39–117)
BUN: 24 mg/dL — ABNORMAL HIGH (ref 6–23)
CO2: 25 mEq/L (ref 19–32)
Calcium: 9.4 mg/dL (ref 8.4–10.5)
Chloride: 108 mEq/L (ref 96–112)
Creatinine, Ser: 1.64 mg/dL — ABNORMAL HIGH (ref 0.40–1.50)
GFR: 49.71 mL/min — ABNORMAL LOW (ref >60.00)
Glucose, Bld: 116 mg/dL — ABNORMAL HIGH (ref 70–99)
Potassium: 4.4 mEq/L (ref 3.5–5.1)
Sodium: 141 mEq/L (ref 135–145)
Total Bilirubin: 0.3 mg/dL (ref 0.2–1.2)
Total Protein: 7.1 g/dL (ref 6.0–8.3)

## 2019-08-10 LAB — T4, FREE: Free T4: 0.96 ng/dL (ref 0.60–1.60)

## 2019-08-10 LAB — MICROALBUMIN / CREATININE URINE RATIO
Creatinine,U: 112.3 mg/dL
Microalb Creat Ratio: 5 mg/g (ref 0.0–30.0)
Microalb, Ur: 5.6 mg/dL — ABNORMAL HIGH (ref 0.0–1.9)

## 2019-08-11 LAB — HEMOGLOBIN A1C: Hgb A1c MFr Bld: 6.9 % — ABNORMAL HIGH (ref 4.6–6.5)

## 2019-08-16 ENCOUNTER — Other Ambulatory Visit: Payer: Self-pay

## 2019-08-17 ENCOUNTER — Ambulatory Visit (INDEPENDENT_AMBULATORY_CARE_PROVIDER_SITE_OTHER): Payer: Medicare Other | Admitting: Endocrinology

## 2019-08-17 ENCOUNTER — Encounter: Payer: Self-pay | Admitting: Endocrinology

## 2019-08-17 VITALS — BP 116/68 | HR 69 | Ht 71.25 in | Wt 237.2 lb

## 2019-08-17 DIAGNOSIS — E669 Obesity, unspecified: Secondary | ICD-10-CM

## 2019-08-17 DIAGNOSIS — E230 Hypopituitarism: Secondary | ICD-10-CM

## 2019-08-17 DIAGNOSIS — E1169 Type 2 diabetes mellitus with other specified complication: Secondary | ICD-10-CM | POA: Diagnosis not present

## 2019-08-17 DIAGNOSIS — N289 Disorder of kidney and ureter, unspecified: Secondary | ICD-10-CM

## 2019-08-17 DIAGNOSIS — I10 Essential (primary) hypertension: Secondary | ICD-10-CM | POA: Diagnosis not present

## 2019-08-17 NOTE — Progress Notes (Signed)
- Patient ID: Carl Gutierrez, male   DOB: 1945/08/20, 74 y.o.   MRN: [id number]           Reason for Appointment: Endocrinology follow-up  History of Present Illness    Recent history:     INSULIN regimen: Humalog 75/25 insulin, 20 units before breakfast and 8 before dinner  Non-insulin hypoglycemic drugs: Janumet XR 100/1000 daily  His A1c is about the same at 6.9 compared to 6.8  Current management, blood sugar patterns and problems identified:  He has been checking blood sugars mostly in the morning on waking up and is asking about reducing his testing  However most of his blood sugars are near normal fasting and not low  He has a few readings nonfasting which are sporadically higher  He thinks that blood sugars will go up if he goes off his diet  Has gained weight gradually since his last visit in 11/20 and previously also was gaining weight  He has not done much physical activity or walking Overall blood sugars appear to be higher in the afternoons compared to the morning    Side effects from medications: None       Monitors blood glucose:  Once or twice a day.    Glucometer:  One Touch           Blood Glucose readings from download   PRE-MEAL Fasting Lunch Dinner Bedtime Overall  Glucose range:  73-106   130, 160  123, 239   Mean/median: 91     124    Prior:  PRE-MEAL Fastin9 g Lunch Dinner Bedtime Overall  Glucose range:  68-96  98 152, 177  135, 203   Mean/median:  88    122   POST-MEAL PC Breakfast PC Lunch PC Dinner  Glucose range:  174    Mean/median:                 Dietician visit: Most recent: 11/2012     Weight history   Wt Readings from Last 3 Encounters:  08/17/19 237 lb 3.2 oz (107.6 kg)  08/03/19 235 lb (106.6 kg)  05/30/19 234 lb 3.2 oz (106.2 kg)            Diabetes labs:  Lab Results  Component Value Date   HGBA1C 6.9 (H) 08/10/2019   HGBA1C 6.8 (H) 05/03/2019   HGBA1C 7.0 (H) 01/24/2019   Lab Results  Component Value  Date   MICROALBUR 5.6 (H) 08/10/2019   LDLCALC 55 11/23/2018   CREATININE 1.64 (H) 08/10/2019     Allergies as of 08/17/2019      Reactions   Hydrochlorothiazide    Leg and side pain with this   Other    Blood pressure med name unknown.      Medication List       Accurate as of August 17, 2019  2:15 PM. If you have any questions, ask your nurse or doctor.        amLODipine 10 MG tablet Commonly known as: NORVASC TAKE 1 TABLET BY MOUTH EVERY DAY   aspirin 81 MG tablet Take 81 mg by mouth daily. Reported on 09/05/2015   B-D UF III MINI PEN NEEDLES 31G X 5 MM Misc Generic drug: Insulin Pen Needle USE FOR INSULIN PEN TWICE A DAY   glucose blood test strip Use OneTouch Verio test strips as instructed to check blood sugar once daily.DX:E11.65   hydrocortisone 20 MG tablet Commonly known as: CORTEF Take 10-20 mg by mouth  2 (two) times daily. Takes 1 tablet in the morning and 1/2 tablet at night   Insulin Lispro Prot & Lispro (75-25) 100 UNIT/ML Kwikpen Commonly known as: HumaLOG Mix 75/25 KwikPen Inject 20 units under the skin before breakfast and 8 units before supper. (Replaces 70/30)   Janumet XR [phone] MG Tb24 Generic drug: SitaGLIPtin-MetFORMIN HCl TAKE 1 TABLET BY MOUTH EVERY DAY   levothyroxine 88 MCG tablet Commonly known as: SYNTHROID Take 1 tablet (88 mcg total) by mouth daily.   lisinopril 20 MG tablet Commonly known as: ZESTRIL TAKE 1 TABLET (20 MG TOTAL) BY MOUTH DAILY.   metoprolol succinate 25 MG 24 hr tablet Commonly known as: TOPROL-XL TAKE 1 TABLET BY MOUTH EVERY DAY   rosuvastatin 40 MG tablet Commonly known as: CRESTOR TAKE 1 TABLET BY MOUTH EVERY DAY       Allergies:  Allergies  Allergen Reactions  . Hydrochlorothiazide     Leg and side pain with this  . Other     Blood pressure med name unknown.    Past Medical History:  Diagnosis Date  . CAD S/P percutaneous coronary angioplasty February 2004; 2008   PCI-dLAD: 2.25 mm x 16 mm  Express BMS; Ramus- 2.75 mm x 18 mm Cypher DES (2.8 mm); follow-up 11/'08: Patent LAD/ramus stents. 90% small OM. EF 50-60%.; Myoview 1/'12: Exercise 9 min, 10 METS with no ischemia/infarction.   . Clotting disorder ([place])   . Diabetes mellitus    Low-dose metformin  . DJD (degenerative joint disease), lumbosacral     status post L4-L5 surgery  . Essential hypertension   . History of DVT of lower extremity     postoperative  . Hyperlipidemia with target LDL less than 70   . Hypothyroidism    Synthroid  . Panhypopituitarism ([place])    Status post pituitary adenoma removal in 2012  . Recurrent deep vein thrombosis (DVT) ([place]) 10/01/2015    Past Surgical History:  Procedure Laterality Date  . BRAIN SURGERY  2012   Removal of pituitary adenoma  . CARDIAC CATHETERIZATION  November 2008   Significant LAD tapering but no significant disease the distal stent. Patent Ramus stent. 90% lesion in small OM 2; small nondominant RCA. Medical therapy. EF 50-60%.  . CORONARY ANGIOPLASTY WITH STENT PLACEMENT  February 2004   PCI-dLAD: 2.25 mm x 16 mm BMS;;PCI- RI: 2.75 mm 18 mm Cypher DES  . NM MYOVIEW LTD  January 2012   Exercise ~9 min - 10 METS; no ischemia or infarction  . PROSTATECTOMY N/A 08/13/2015   Procedure: TRANSVESICLE OPEN PROSTATECTOMY** ;  Surgeon: Carolan Clines, MD;  Location: [facility];  Service: Urology;  Laterality: N/A;  . SPINE SURGERY     L4-L5    Family History  Problem Relation Age of Onset  . Cancer Mother        pancreatic ca    Social History:  reports that he quit smoking about 26 years ago. He has never used smokeless tobacco. He reports that he does not drink alcohol or use drugs.  Review of Systems:  Hypertension:  He is taking amlodipine and lisinopril 20 mg as well as 25 mg metoprolol prescribed by his cardiologist Does check at home occasionally also No lightheadedness on standing up  BP Readings from Last 3 Encounters:  08/17/19 116/68  08/03/19 110/64   05/30/19 140/79   Renal function history: His renal function appears to be worse He does not take any Advil or ibuprofen  Lab Results  Component  Value Date   CREATININE 1.64 (H) 08/10/2019   CREATININE 1.43 05/03/2019   CREATININE 1.34 01/24/2019     HYPOPITUITARISM: He has been taking levothyroxine and Cortef as before Takes Cortef as directed both morning and early evening regularly each day No symptoms of weakness  Free T4 has been consistently normal with current dosage of 88 mcg levothyroxine  Lab Results  Component Value Date   TSH 2.960 11/23/2018   TSH 4.09 01/29/2018   TSH 1.51 05/23/2016   FREET4 0.96 08/10/2019   FREET4 1.09 05/03/2019   FREET4 1.24 10/26/2018       LABS:  No visits with results within 1 Week(s) from this visit.  Latest known visit with results is:  Lab on 08/10/2019  Component Date Value Ref Range Status  . Microalb, Ur 08/10/2019 5.6* 0.0 - 1.9 mg/dL Final  . Creatinine,U 08/10/2019 112.3  mg/dL Final  . Microalb Creat Ratio 08/10/2019 5.0  0.0 - 30.0 mg/g Final  . Free T4 08/10/2019 0.96  0.60 - 1.60 ng/dL Final   Comment: Specimens from patients who are undergoing biotin therapy and /or ingesting biotin supplements may contain high levels of biotin.  The higher biotin concentration in these specimens interferes with this Free T4 assay.  Specimens that contain high levels  of biotin may cause false high results for this Free T4 assay.  Please interpret results in light of the total clinical presentation of the patient.    . Sodium 08/10/2019 141  135 - 145 mEq/L Final  . Potassium 08/10/2019 4.4  3.5 - 5.1 mEq/L Final  . Chloride 08/10/2019 108  96 - 112 mEq/L Final  . CO2 08/10/2019 25  19 - 32 mEq/L Final  . Glucose, Bld 08/10/2019 116* 70 - 99 mg/dL Final  . BUN 08/10/2019 24* 6 - 23 mg/dL Final  . Creatinine, Ser 08/10/2019 1.64* 0.40 - 1.50 mg/dL Final  . Total Bilirubin 08/10/2019 0.3  0.2 - 1.2 mg/dL Final  . Alkaline  Phosphatase 08/10/2019 69  39 - 117 U/L Final  . AST 08/10/2019 12  0 - 37 U/L Final  . ALT 08/10/2019 12  0 - 53 U/L Final  . Total Protein 08/10/2019 7.1  6.0 - 8.3 g/dL Final  . Albumin 08/10/2019 4.0  3.5 - 5.2 g/dL Final  . GFR 08/10/2019 49.71* >60.00 mL/min Final  . Calcium 08/10/2019 9.4  8.4 - 10.5 mg/dL Final  . Hgb A1c MFr Bld 08/10/2019 6.9* 4.6 - 6.5 % Final   Glycemic Control Guidelines for People with Diabetes:Non Diabetic:  <6%Goal of Therapy: <7%Additional Action Suggested:  >8%      Examination:   BP 116/68 (Patient Position: Standing)   Pulse 69   Ht 5' 11.25" (1.81 m)   Wt 237 lb 3.2 oz (107.6 kg)   SpO2 97%   BMI 32.85 kg/m   Body mass index is 32.85 kg/m.    ASSESSMENT/ PLAN:    Diabetes type 2 with mild obesity:   See history of present illness for detailed discussion of current diabetes management, blood sugar patterns and problems identified  He is on premixed insulin twice a day started when he had marked hyperglycemia in 09/2018  A1c is stable at 6.9  He has mostly postprandial hyperglycemia since his fasting readings are averaging below 100 No hypoglycemia reported overnight May be getting higher readings with switching to 75/25 insulin which is preferred on his insurance However he does not check in the readings after meals He is  continuing to gain weight Highest blood sugar 239 at home  Also on Janumet XR  RENAL dysfunction: This may be related to lower blood pressure readings especially on standing up and being on lisinopril No other reversible factors apparent and his microalbumin is normal   HYPERTENSION: His blood pressure is relatively lower standing up and also low normal when seen by cardiologist last month  PLAN:  He will need to start walking regularly even if he cannot do much at a time, preferably walk at least 10 minutes twice a day  Cut back on portions, high-fat and high glycemic index foods  More consistent monitoring 2  hours after meals and call if consistently high  No change in insulin  He will also stay on Janumet but may consider Rybelsus if weight gain continues to be a problem  We will reduce his LISINOPRIL to 10 mg instead of 20 and inform his cardiologist regarding this change  Follow-up renal function in 1 month  Continue Cortef and levothyroxine unchanged  Patient Instructions  Check blood sugars on waking up 1-2 days a week  Also check blood sugars about 2 hours after meals and do this after different meals by rotation  Recommended blood sugar levels on waking up are 90-130 and about 2 hours after meal is 130-160  Please bring your blood sugar monitor to each visit, thank you  Less bread  Cut Lisinopril in 1/2  Walk daily      Miria Cappelli 08/17/2019, 2:15 PM

## 2019-08-17 NOTE — Patient Instructions (Addendum)
Check blood sugars on waking up 1-2 days a week  Also check blood sugars about 2 hours after meals and do this after different meals by rotation  Recommended blood sugar levels on waking up are 90-130 and about 2 hours after meal is 130-160  Please bring your blood sugar monitor to each visit, thank you  Less bread  Cut Lisinopril in 1/2  Walk daily

## 2019-08-18 ENCOUNTER — Other Ambulatory Visit: Payer: Self-pay | Admitting: Endocrinology

## 2019-08-26 ENCOUNTER — Other Ambulatory Visit: Payer: Self-pay | Admitting: Cardiology

## 2019-09-06 ENCOUNTER — Other Ambulatory Visit: Payer: Self-pay | Admitting: Endocrinology

## 2019-09-19 ENCOUNTER — Other Ambulatory Visit: Payer: Self-pay

## 2019-09-19 ENCOUNTER — Other Ambulatory Visit (INDEPENDENT_AMBULATORY_CARE_PROVIDER_SITE_OTHER): Payer: Medicare Other

## 2019-09-19 DIAGNOSIS — N289 Disorder of kidney and ureter, unspecified: Secondary | ICD-10-CM | POA: Diagnosis not present

## 2019-09-19 LAB — BASIC METABOLIC PANEL
BUN: 19 mg/dL (ref 6–23)
CO2: 25 mEq/L (ref 19–32)
Calcium: 9.3 mg/dL (ref 8.4–10.5)
Chloride: 107 mEq/L (ref 96–112)
Creatinine, Ser: 1.55 mg/dL — ABNORMAL HIGH (ref 0.40–1.50)
GFR: 53.04 mL/min — ABNORMAL LOW (ref >60.00)
Glucose, Bld: 91 mg/dL (ref 70–99)
Potassium: 4.5 mEq/L (ref 3.5–5.1)
Sodium: 140 mEq/L (ref 135–145)

## 2019-09-22 ENCOUNTER — Encounter: Payer: Self-pay | Admitting: Endocrinology

## 2019-09-22 ENCOUNTER — Other Ambulatory Visit: Payer: Self-pay

## 2019-09-22 ENCOUNTER — Ambulatory Visit (INDEPENDENT_AMBULATORY_CARE_PROVIDER_SITE_OTHER): Payer: Medicare Other | Admitting: Endocrinology

## 2019-09-22 VITALS — BP 118/68 | HR 63 | Ht 71.0 in | Wt 232.4 lb

## 2019-09-22 DIAGNOSIS — E669 Obesity, unspecified: Secondary | ICD-10-CM

## 2019-09-22 DIAGNOSIS — I10 Essential (primary) hypertension: Secondary | ICD-10-CM | POA: Diagnosis not present

## 2019-09-22 DIAGNOSIS — E782 Mixed hyperlipidemia: Secondary | ICD-10-CM

## 2019-09-22 DIAGNOSIS — E1169 Type 2 diabetes mellitus with other specified complication: Secondary | ICD-10-CM

## 2019-09-22 DIAGNOSIS — N289 Disorder of kidney and ureter, unspecified: Secondary | ICD-10-CM | POA: Diagnosis not present

## 2019-09-22 MED ORDER — LISINOPRIL 10 MG PO TABS: 10.0000 mg | ORAL_TABLET | Freq: Every day | ORAL | 3 refills | Status: DC

## 2019-09-22 NOTE — Progress Notes (Signed)
- Patient ID: Carl Gutierrez, male   DOB: January 30, 1946, 74 y.o.   MRN: [id number]           Reason for Appointment: Endocrinology follow-up  History of Present Illness    Recent history:     INSULIN regimen: Humalog 75/25 insulin, 20 units before breakfast and 8 before dinner  Non-insulin hypoglycemic drugs: Janumet XR 100/1000 daily  His A1c is last 6.9  Current management, blood sugar patterns and problems identified:  He has only 1 or 2 relatively high readings midday or after supper with highest reading 200  Otherwise fasting readings are consistently normal without hypoglycemia  Blood sugars are also not appearing higher at dinnertime  He has been asked to start walking for exercise and he is doing some  His weight is slightly lower compared to last visit  Side effects from medications: None      Monitors blood glucose:  Once or twice a day.    Glucometer:  One Touch           Blood Glucose readings from download  Blood sugar range 76-200 AVERAGE 117 with morning average 91   Previous readings  PRE-MEAL Fasting Lunch Dinner Bedtime Overall  Glucose range:  73-106   130, 160  123, 239   Mean/median: 91     124     Dietician visit: Most recent: 11/2012     Weight history   Wt Readings from Last 3 Encounters:  09/22/19 232 lb 6.4 oz (105.4 kg)  08/17/19 237 lb 3.2 oz (107.6 kg)  08/03/19 235 lb (106.6 kg)            Diabetes labs:  Lab Results  Component Value Date   HGBA1C 6.9 (H) 08/10/2019   HGBA1C 6.8 (H) 05/03/2019   HGBA1C 7.0 (H) 01/24/2019   Lab Results  Component Value Date   MICROALBUR 5.6 (H) 08/10/2019   LDLCALC 55 11/23/2018   CREATININE 1.55 (H) 09/19/2019     Allergies as of 09/22/2019      Reactions   Hydrochlorothiazide    Leg and side pain with this   Other    Blood pressure med name unknown.      Medication List       Accurate as of September 22, 2019 11:12 AM. If you have any questions, ask your nurse or doctor.          amLODipine 10 MG tablet Commonly known as: NORVASC TAKE 1 TABLET BY MOUTH EVERY DAY   aspirin 81 MG tablet Take 81 mg by mouth daily. Reported on 09/05/2015   B-D UF III MINI PEN NEEDLES 31G X 5 MM Misc Generic drug: Insulin Pen Needle USE FOR INSULIN PEN TWICE A DAY   hydrocortisone 20 MG tablet Commonly known as: CORTEF Take 10-20 mg by mouth 2 (two) times daily. Takes 1 tablet in the morning and 1/2 tablet at night   Insulin Lispro Prot & Lispro (75-25) 100 UNIT/ML Kwikpen Commonly known as: HumaLOG Mix 75/25 KwikPen Inject 20 units under the skin before breakfast and 8 units before supper. (Replaces 70/30)   Janumet XR [phone] MG Tb24 Generic drug: SitaGLIPtin-MetFORMIN HCl TAKE 1 TABLET BY MOUTH EVERY DAY   levothyroxine 88 MCG tablet Commonly known as: SYNTHROID Take 1 tablet (88 mcg total) by mouth daily.   lisinopril 10 MG tablet Commonly known as: ZESTRIL Take 1 tablet (10 mg total) by mouth daily. What changed:   medication strength  how much to take  how  to take this  when to take this  additional instructions Changed by: Elayne Snare, MD   metoprolol succinate 25 MG 24 hr tablet Commonly known as: TOPROL-XL TAKE 1 TABLET BY MOUTH EVERY DAY   OneTouch Verio test strip Generic drug: glucose blood USE ONETOUCH VERIO TEST STRIPS AS INSTRUCTED TO CHECK BLOOD SUGAR ONCE DAILY.DX:E11.65   rosuvastatin 40 MG tablet Commonly known as: CRESTOR TAKE 1 TABLET BY MOUTH EVERY DAY       Allergies:  Allergies  Allergen Reactions  . Hydrochlorothiazide     Leg and side pain with this  . Other     Blood pressure med name unknown.    Past Medical History:  Diagnosis Date  . CAD S/P percutaneous coronary angioplasty February 2004; 2008   PCI-dLAD: 2.25 mm x 16 mm Express BMS; Ramus- 2.75 mm x 18 mm Cypher DES (2.8 mm); follow-up 11/'08: Patent LAD/ramus stents. 90% small OM. EF 50-60%.; Myoview 1/'12: Exercise 9 min, 10 METS with no ischemia/infarction.    . Clotting disorder ([place])   . Diabetes mellitus    Low-dose metformin  . DJD (degenerative joint disease), lumbosacral     status post L4-L5 surgery  . Essential hypertension   . History of DVT of lower extremity     postoperative  . Hyperlipidemia with target LDL less than 70   . Hypothyroidism    Synthroid  . Panhypopituitarism ([place])    Status post pituitary adenoma removal in 2012  . Recurrent deep vein thrombosis (DVT) ([place]) 10/01/2015    Past Surgical History:  Procedure Laterality Date  . BRAIN SURGERY  2012   Removal of pituitary adenoma  . CARDIAC CATHETERIZATION  November 2008   Significant LAD tapering but no significant disease the distal stent. Patent Ramus stent. 90% lesion in small OM 2; small nondominant RCA. Medical therapy. EF 50-60%.  . CORONARY ANGIOPLASTY WITH STENT PLACEMENT  February 2004   PCI-dLAD: 2.25 mm x 16 mm BMS;;PCI- RI: 2.75 mm 18 mm Cypher DES  . NM MYOVIEW LTD  January 2012   Exercise ~9 min - 10 METS; no ischemia or infarction  . PROSTATECTOMY N/A 08/13/2015   Procedure: TRANSVESICLE OPEN PROSTATECTOMY** ;  Surgeon: Carolan Clines, MD;  Location: [facility];  Service: Urology;  Laterality: N/A;  . SPINE SURGERY     L4-L5    Family History  Problem Relation Age of Onset  . Cancer Mother        pancreatic ca    Social History:  reports that he quit smoking about 26 years ago. He has never used smokeless tobacco. He reports that he does not drink alcohol or use drugs.  Review of Systems:  Hypertension:  He is taking 10 mg amlodipine and lisinopril 10 mg prescribed by his cardiologist On his last visit his blood pressure was low normal and creatinine higher than usual and lisinopril was reduced to half of the 20 mg tablet  Blood pressure appears to be about the same today No dizziness on standing up  BP Readings from Last 3 Encounters:  09/22/19 118/68  08/17/19 116/68  08/03/19 110/64   Renal function history: His renal function is  slightly better with reducing his lisinopril on the last visit However still not back to baseline  Lab Results  Component Value Date   CREATININE 1.55 (H) 09/19/2019   CREATININE 1.64 (H) 08/10/2019   CREATININE 1.43 05/03/2019    The following is a copy in the previous note:  HYPOPITUITARISM: He  has been taking levothyroxine and Cortef as before Takes Cortef as directed both morning and early evening regularly each day No symptoms of weakness  Free T4 has been consistently normal with current dosage of 88 mcg levothyroxine  Lab Results  Component Value Date   TSH 2.960 11/23/2018   TSH 4.09 01/29/2018   TSH 1.51 05/23/2016   FREET4 0.96 08/10/2019   FREET4 1.09 05/03/2019   FREET4 1.24 10/26/2018       LABS:  Lab on 09/19/2019  Component Date Value Ref Range Status  . Sodium 09/19/2019 140  135 - 145 mEq/L Final  . Potassium 09/19/2019 4.5  3.5 - 5.1 mEq/L Final  . Chloride 09/19/2019 107  96 - 112 mEq/L Final  . CO2 09/19/2019 25  19 - 32 mEq/L Final  . Glucose, Bld 09/19/2019 91  70 - 99 mg/dL Final  . BUN 09/19/2019 19  6 - 23 mg/dL Final  . Creatinine, Ser 09/19/2019 1.55* 0.40 - 1.50 mg/dL Final  . GFR 09/19/2019 53.04* >60.00 mL/min Final  . Calcium 09/19/2019 9.3  8.4 - 10.5 mg/dL Final     Examination:   BP 118/68 (Patient Position: Standing)   Pulse 63   Ht 5\' 11"  (1.803 m)   Wt 232 lb 6.4 oz (105.4 kg)   SpO2 97%   BMI 32.41 kg/m   Body mass index is 32.41 kg/m.    ASSESSMENT/ PLAN:   RENAL dysfunction: No history of microalbuminuria Creatinine is slightly lower with reducing lisinopril However blood pressure is still low normal and about the same as on the last visit  HYPERTENSION: His blood pressure is still low normal on standing up Currently on 10 mg lisinopril and 10 mg amlodipine Has not checked readings at home lately   Diabetes type 2 with mild obesity:   See history of present illness for detailed discussion of current diabetes  management, blood sugar patterns and problems identified  He is on premixed insulin twice a day started when he had marked hyperglycemia in 09/2018 Also on Janumet XR  Blood sugars are fairly good at home although low normal fasting without any hypoglycemia    PLAN:  He will take only half a tablet of the amlodipine 10 mg  New prescription for 10 mg lisinopril given  He will let us know if his blood pressure is unusually high at home  He was follow-up with his PCP in June  To avoid any overnight hypoglycemia will reduce his evening premixed insulin to 6 units  Patient Instructions  Take 1/2 Amlodipine  6 Units at supper  10mg  Lisinopril      Elayne Snare 09/22/2019, 11:12 AM

## 2019-09-22 NOTE — Patient Instructions (Addendum)
Take 1/2 Amlodipine  6 Units at supper  10mg  Lisinopril

## 2019-10-19 ENCOUNTER — Other Ambulatory Visit: Payer: Self-pay | Admitting: Endocrinology

## 2019-11-23 ENCOUNTER — Ambulatory Visit: Payer: Medicare Other | Admitting: Family Medicine

## 2019-11-28 ENCOUNTER — Other Ambulatory Visit: Payer: Self-pay

## 2019-11-28 ENCOUNTER — Encounter: Payer: Self-pay | Admitting: Family Medicine

## 2019-11-28 ENCOUNTER — Ambulatory Visit (INDEPENDENT_AMBULATORY_CARE_PROVIDER_SITE_OTHER): Payer: Medicare Other | Admitting: Family Medicine

## 2019-11-28 VITALS — BP 130/80 | HR 66 | Temp 98.0°F | Ht 71.0 in | Wt 231.0 lb

## 2019-11-28 DIAGNOSIS — I10 Essential (primary) hypertension: Secondary | ICD-10-CM

## 2019-11-28 DIAGNOSIS — E1165 Type 2 diabetes mellitus with hyperglycemia: Secondary | ICD-10-CM

## 2019-11-28 DIAGNOSIS — Z0001 Encounter for general adult medical examination with abnormal findings: Secondary | ICD-10-CM

## 2019-11-28 DIAGNOSIS — E785 Hyperlipidemia, unspecified: Secondary | ICD-10-CM | POA: Diagnosis not present

## 2019-11-28 DIAGNOSIS — Z794 Long term (current) use of insulin: Secondary | ICD-10-CM

## 2019-11-28 DIAGNOSIS — E039 Hypothyroidism, unspecified: Secondary | ICD-10-CM | POA: Diagnosis not present

## 2019-11-28 DIAGNOSIS — Z0000 Encounter for general adult medical examination without abnormal findings: Secondary | ICD-10-CM

## 2019-11-28 MED ORDER — LEVOTHYROXINE SODIUM 88 MCG PO TABS: 88.0000 ug | ORAL_TABLET | Freq: Every day | ORAL | 2 refills | Status: DC

## 2019-11-28 NOTE — Patient Instructions (Addendum)
Tylenol as needed over the counter but I am happy to evaluate your knee when you are ready. Pool based exercise may be tolerated better. I do recommend repeat colonoscopy - let me know if you change your mind.  Thanks for coming in today and take care!   Preventive Care 76 Years and Older, Male Preventive care refers to lifestyle choices and visits with your health care provider that can promote health and wellness. This includes:  A yearly physical exam. This is also called an annual well check.  Regular dental and eye exams.  Immunizations.  Screening for certain conditions.  Healthy lifestyle choices, such as diet and exercise. What can I expect for my preventive care visit? Physical exam Your health care provider will check:  Height and weight. These may be used to calculate body mass index (BMI), which is a measurement that tells if you are at a healthy weight.  Heart rate and blood pressure.  Your skin for abnormal spots. Counseling Your health care provider may ask you questions about:  Alcohol, tobacco, and drug use.  Emotional well-being.  Home and relationship well-being.  Sexual activity.  Eating habits.  History of falls.  Memory and ability to understand (cognition).  Work and work Statistician. What immunizations do I need?  Influenza (flu) vaccine  This is recommended every year. Tetanus, diphtheria, and pertussis (Tdap) vaccine  You may need a Td booster every 10 years. Varicella (chickenpox) vaccine  You may need this vaccine if you have not already been vaccinated. Zoster (shingles) vaccine  You may need this after age 76. Pneumococcal conjugate (PCV13) vaccine  One dose is recommended after age 76. Pneumococcal polysaccharide (PPSV23) vaccine  One dose is recommended after age 76. Measles, mumps, and rubella (MMR) vaccine  You may need at least one dose of MMR if you were born in 1957 or later. You may also need a second  dose. Meningococcal conjugate (MenACWY) vaccine  You may need this if you have certain conditions. Hepatitis A vaccine  You may need this if you have certain conditions or if you travel or work in places where you may be exposed to hepatitis A. Hepatitis B vaccine  You may need this if you have certain conditions or if you travel or work in places where you may be exposed to hepatitis B. Haemophilus influenzae type b (Hib) vaccine  You may need this if you have certain conditions. You may receive vaccines as individual doses or as more than one vaccine together in one shot (combination vaccines). Talk with your health care provider about the risks and benefits of combination vaccines. What tests do I need? Blood tests  Lipid and cholesterol levels. These may be checked every 5 years, or more frequently depending on your overall health.  Hepatitis C test.  Hepatitis B test. Screening  Lung cancer screening. You may have this screening every year starting at age 76 if you have a 30-pack-year history of smoking and currently smoke or have quit within the past 15 years.  Colorectal cancer screening. All adults should have this screening starting at age 76 and continuing until age 76. Your health care provider may recommend screening at age 76 if you are at increased risk. You will have tests every 1-10 years, depending on your results and the type of screening test.  Prostate cancer screening. Recommendations will vary depending on your family history and other risks.  Diabetes screening. This is done by checking your blood sugar (glucose) after  you have not eaten for a while (fasting). You may have this done every 1-3 years.  Abdominal aortic aneurysm (AAA) screening. You may need this if you are a current or former smoker.  Sexually transmitted disease (STD) testing. Follow these instructions at home: Eating and drinking  Eat a diet that includes fresh fruits and vegetables, whole  grains, lean protein, and low-fat dairy products. Limit your intake of foods with high amounts of sugar, saturated fats, and salt.  Take vitamin and mineral supplements as recommended by your health care provider.  Do not drink alcohol if your health care provider tells you not to drink.  If you drink alcohol: ? Limit how much you have to 0-2 drinks a day. ? Be aware of how much alcohol is in your drink. In the [place] one drink equals one 12 oz bottle of beer (355 mL), one 5 oz glass of wine (148 mL), or one 1 oz glass of hard liquor (44 mL). Lifestyle  Take daily care of your teeth and gums.  Stay active. Exercise for at least 30 minutes on 5 or more days each week.  Do not use any products that contain nicotine or tobacco, such as cigarettes, e-cigarettes, and chewing tobacco. If you need help quitting, ask your health care provider.  If you are sexually active, practice safe sex. Use a condom or other form of protection to prevent STIs (sexually transmitted infections).  Talk with your health care provider about taking a low-dose aspirin or statin. What's next?  Visit your health care provider once a year for a well check visit.  Ask your health care provider how often you should have your eyes and teeth checked.  Stay up to date on all vaccines. This information is not intended to replace advice given to you by your health care provider. Make sure you discuss any questions you have with your health care provider. Document Revised: 05/27/2018 Document Reviewed: 05/27/2018 Elsevier Patient Education  [employer].    If you have lab work done today you will be contacted with your lab results within the next 2 weeks.  If you have not heard from [place] then please contact us. The fastest way to get your results is to register for My Chart.   IF you received an x-ray today, you will receive an invoice from [facility]. Please contact [facility] at [phone]  with questions or concerns regarding your invoice.   IF you received labwork today, you will receive an invoice from [facility]. Please contact [facility] at [phone] with questions or concerns regarding your invoice.   Our billing staff will not be able to assist you with questions regarding bills from these companies.  You will be contacted with the lab results as soon as they are available. The fastest way to get your results is to activate your My Chart account. Instructions are located on the last page of this paperwork. If you have not heard from [place] regarding the results in 2 weeks, please contact this office.

## 2019-11-28 NOTE — Progress Notes (Signed)
Subjective:  Patient ID: Carl Gutierrez, male    DOB: Jun 05, 1946  Age: 74 y.o. MRN: [id number]  CC:  Chief Complaint  Patient presents with  . Medicare Wellness    pt reports thate he feels good with no complaints. pt states he take his medications as prescribed and hasn't had any known side effects.    HPI Carl Gutierrez presents for   [employer] exam, med follow-up  Diabetes: With hyperglycemia, CKD. Followed by endocrinology, Dr. Dwyane Dee.Marland Kitchen History of panhypopituitarism. On ACE and statin. microalbumin: Normal ratio 08/10/2019 Optho, foot exam, pneumovax: Ophthalmology exam: has performed through [place].  Creatinine improved with lisinopril reduction at 09/22/19 visit. (1.64 to 1.55).   Lab Results  Component Value Date   HGBA1C 6.9 (H) 08/10/2019   HGBA1C 6.8 (H) 05/03/2019   HGBA1C 7.0 (H) 01/24/2019   Lab Results  Component Value Date   MICROALBUR 5.6 (H) 08/10/2019   LDLCALC 55 11/23/2018   CREATININE 1.55 (H) 09/19/2019   Cardiac Cardiologist Dr. Ellyn Hack, appointment 08/03/2019. History of hypertension, CAD status post PCI in 2004 in 2008, no anginal symptoms. Continued on amlodipine, lisinopril, Toprol, rosuvastatin, aspirin. No CP with exertion. Some walking for exercise - daily 77mn per day. L knee pain limits things at times. No pain meds. Does not want evaluated at this time.   Hypothyroidism: With panhypopituitarism. Lab Results  Component Value Date   TSH 2.960 11/23/2018  Synthroid 88 mcg daily Hydrocortisone 20 mg-20 mg in the morning, 10 mg at night. Testosterone supplement in the past - not currently.   Hyperlipidemia: Crestor 40 mg daily. Continued on same dose last cardiology visit. Lab Results  Component Value Date   CHOL 125 11/23/2018   HDL 49 11/23/2018   LDLCALC 55 11/23/2018   LDLDIRECT 85.0 05/18/2014   TRIG 106 11/23/2018   CHOLHDL 2.6 11/23/2018   Lab Results  Component Value Date   ALT 12 08/10/2019   AST 12 08/10/2019   ALKPHOS 69  08/10/2019   BILITOT 0.3 08/10/2019   Fall Risk  11/28/2019 05/30/2019 11/23/2018 11/16/2017 12/27/2016  Falls in the past year? 0 0 0 No No  Number falls in past yr: - 0 0 - -  Injury with Fall? - 0 0 - -  Risk for fall due to : - - History of fall(s) - -  Follow up Falls evaluation completed Falls evaluation completed Falls evaluation completed;Education provided;Falls prevention discussed - -    [facility] 11/28/2019 05/30/2019 11/23/2018 11/16/2017 12/27/2016  Decreased Interest 0 0 0 0 0  Down, Depressed, Hopeless 0 0 0 0 0  PHQ - 2 Score 0 0 0 0 0   Cancer screening: Colonoscopy 06/16/2009. Declines repeat colonoscopy. 1-2 polyps prior. Declines cologuard as well.   Immunization History  Administered Date(s) Administered  . Fluad Quad(high Dose 65+) 02/20/2019  . Influenza, High Dose Seasonal PF 04/03/2015, 04/02/2017  . Influenza-Unspecified 03/16/2013, 04/05/2014, 04/03/2015, 04/22/2016  . Moderna SARS-COVID-2 Vaccination 07/19/2019, 08/18/2019  . Pneumococcal Conjugate-13 01/22/2015  . Pneumococcal Polysaccharide-23 06/16/2009  . Tdap 06/16/2005   Functional Status Survey: Is the patient deaf or have difficulty hearing?: Yes Does the patient have difficulty seeing, even when wearing glasses/contacts?: No Does the patient have difficulty concentrating, remembering, or making decisions?: No Does the patient have difficulty walking or climbing stairs?: Yes Does the patient have difficulty dressing or bathing?: No Does the patient have difficulty doing errands alone such as visiting a doctor's office or shopping?: No  6CIT Screen  11/28/2019 11/23/2018 11/16/2017  What Year? 0 points 0 points 0 points  What month? 0 points 0 points 0 points  What time? 0 points 0 points 0 points  Count back from 20 0 points 0 points 0 points  Months in reverse 0 points 0 points 0 points  Repeat phrase 0 points 0 points 0 points  Total Score 0 0 0      Office Visit from 11/28/2019 in  [facility] at [facility]  AUDIT-C Score 0     No exam data present  Advanced directives: He has healthcare power of attorney and living will.     History Patient Active Problem List   Diagnosis Date Noted  . Anemia in chronic kidney disease 11/30/2015  . Recurrent deep vein thrombosis (DVT) ([place]) 10/01/2015  . Hematuria, unspecified 10/01/2015  . Elevated serum creatinine 10/01/2015  . BPH (benign prostatic hyperplasia) 08/13/2015  . Benign localized hyperplasia of prostate with urinary obstruction 07/23/2015  . Preop cardiovascular exam 07/17/2015  . Encounter for CDL (commercial driving license) exam 59/16/3846  . Obesity (BMI 30-39.9) 06/26/2013  . Essential hypertension 06/26/2013  . Panhypopituitarism ([place]) 11/17/2011  .   11/17/2011  . Diabetes mellitus ([place]) 11/17/2011  . Hyperlipidemia LDL goal <70 11/17/2011  . CAD S/P percutaneous coronary angioplasty 07/19/2002   Past Medical History:  Diagnosis Date  . CAD S/P percutaneous coronary angioplasty February 2004; 2008   PCI-dLAD: 2.25 mm x 16 mm Express BMS; Ramus- 2.75 mm x 18 mm Cypher DES (2.8 mm); follow-up 11/'08: Patent LAD/ramus stents. 90% small OM. EF 50-60%.; Myoview 1/'12: Exercise 9 min, 10 METS with no ischemia/infarction.   . Clotting disorder ([place])   . Diabetes mellitus    Low-dose metformin  . DJD (degenerative joint disease), lumbosacral     status post L4-L5 surgery  . Essential hypertension   . History of DVT of lower extremity     postoperative  . Hyperlipidemia with target LDL less than 70   . Hypothyroidism    Synthroid  . Panhypopituitarism ([place])    Status post pituitary adenoma removal in 2012  . Recurrent deep vein thrombosis (DVT) ([place]) 10/01/2015   Past Surgical History:  Procedure Laterality Date  . BRAIN SURGERY  2012   Removal of pituitary adenoma  . CARDIAC CATHETERIZATION  November 2008   Significant LAD tapering but no significant disease the distal stent. Patent Ramus stent.  90% lesion in small OM 2; small nondominant RCA. Medical therapy. EF 50-60%.  . CORONARY ANGIOPLASTY WITH STENT PLACEMENT  February 2004   PCI-dLAD: 2.25 mm x 16 mm BMS;;PCI- RI: 2.75 mm 18 mm Cypher DES  . NM MYOVIEW LTD  January 2012   Exercise ~9 min - 10 METS; no ischemia or infarction  . PROSTATECTOMY N/A 08/13/2015   Procedure: TRANSVESICLE OPEN PROSTATECTOMY** ;  Surgeon: Carolan Clines, MD;  Location: [facility];  Service: Urology;  Laterality: N/A;  . SPINE SURGERY     L4-L5   Allergies  Allergen Reactions  . Hydrochlorothiazide     Leg and side pain with this  . Other     Blood pressure med name unknown.   Prior to Admission medications   Medication Sig Start Date End Date Taking? Authorizing Provider  amLODipine (NORVASC) 10 MG tablet TAKE 1 TABLET BY MOUTH EVERY DAY 08/26/19  Yes Leonie Man, MD  aspirin 81 MG tablet Take 81 mg by mouth daily. Reported on 09/05/2015   Yes [provider]  B-D UF  III MINI PEN NEEDLES 31G X 5 MM MISC USE FOR INSULIN PEN TWICE A DAY 08/18/19  Yes Elayne Snare, MD  hydrocortisone (CORTEF) 20 MG tablet Take 10-20 mg by mouth 2 (two) times daily. Takes 1 tablet in the morning and 1/2 tablet at night   Yes [provider]  Insulin Lispro Prot & Lispro (HUMALOG MIX 75/25 KWIKPEN) (75-25) 100 UNIT/ML Kwikpen Inject 20 units under the skin before breakfast and 8 units before supper. (Replaces 70/30) 07/11/19  Yes Elayne Snare, MD  JANUMET XR [phone] MG TB24 TAKE 1 TABLET BY MOUTH EVERY DAY 10/19/19  Yes Elayne Snare, MD  levothyroxine (SYNTHROID) 88 MCG tablet Take 1 tablet (88 mcg total) by mouth daily. 05/30/19  Yes Wendie Agreste, MD  lisinopril (ZESTRIL) 10 MG tablet Take 1 tablet (10 mg total) by mouth daily. 09/22/19  Yes Elayne Snare, MD  metoprolol succinate (TOPROL-XL) 25 MG 24 hr tablet TAKE 1 TABLET BY MOUTH EVERY DAY 08/26/19  Yes Leonie Man, MD  [facility] VERIO test strip USE ONETOUCH VERIO TEST STRIPS AS INSTRUCTED TO  CHECK BLOOD SUGAR ONCE DAILY.DX:E11.65 09/06/19  Yes Elayne Snare, MD  rosuvastatin (CRESTOR) 40 MG tablet TAKE 1 TABLET BY MOUTH EVERY DAY 01/21/19  Yes Leonie Man, MD  testosterone cypionate (DEPO-TESTOSTERONE) 200 MG/ML injection Inject 1.5 mLs (300 mg total) into the muscle every 21 ( twenty-one) days. 11/23/14 03/14/15  Elayne Snare, MD   Social History   Socioeconomic History  . Marital status: Married    Spouse name: Not on file  . Number of children: Not on file  . Years of education: Not on file  . Highest education level: Not on file  Occupational History  . Not on file  Tobacco Use  . Smoking status: Former Smoker    Quit date: 06/24/1993    Years since quitting: 26.4  . Smokeless tobacco: Never Used  Vaping Use  . Vaping Use: Never used  Substance and Sexual Activity  . Alcohol use: No  . Drug use: No  . 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 Not on file  Social History Narrative   He is a married father of 2, grandfather 38. Exercises only occasionally. Not as much as he desires 2. 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   He does not smoke cigarettes -- quit in 1995. Does not drink alcohol.   Social Determinants of Health   Financial Resource Strain:   . Difficulty of Paying Living Expenses:   Food Insecurity:   . Worried About Charity fundraiser in the Last Year:   . Arboriculturist in the Last Year:   Transportation Needs:   . Film/video editor (Medical):   Marland Kitchen Lack of Transportation (Non-Medical):   Physical Activity:   . Days of Exercise per Week:   . Minutes of Exercise per Session:   Stress:   . Feeling of Stress :   Social Connections:   . Frequency of Communication with Friends and Family:   . Frequency of Social Gatherings with Friends and Family:   . Attends Religious Services:   . Active Member of Clubs or Organizations:   . Attends Archivist  Meetings:   Marland Kitchen Marital Status:   Intimate Partner Violence:   . Fear of Current or Ex-Partner:   . Emotionally Abused:   Marland Kitchen Physically  Abused:   . Sexually Abused:     Review of Systems 13 point review of systems per patient health survey noted.  Negative other than as indicated above or in HPI.    Objective:   Vitals:   11/28/19 0826 11/28/19 0836  BP: (!) 149/85 130/80  Pulse: 66   Temp: 98 F (36.7 C)   TempSrc: Temporal   SpO2: 98%   Weight: 231 lb (104.8 kg)   Height: '5\' 11"'$  (1.803 m)      Physical Exam Vitals reviewed.  Constitutional:      Appearance: He is well-developed.  HENT:     Head: Normocephalic and atraumatic.     Right Ear: External ear normal.     Left Ear: External ear normal.  Eyes:     Conjunctiva/sclera: Conjunctivae normal.     Pupils: Pupils are equal, round, and reactive to light.  Neck:     Thyroid: No thyromegaly.  Cardiovascular:     Rate and Rhythm: Normal rate and regular rhythm.     Heart sounds: Normal heart sounds.  Pulmonary:     Effort: Pulmonary effort is normal. No respiratory distress.     Breath sounds: Normal breath sounds. No wheezing.  Abdominal:     General: There is no distension.     Palpations: Abdomen is soft.     Tenderness: There is no abdominal tenderness.  Musculoskeletal:        General: No tenderness. Normal range of motion.     Cervical back: Normal range of motion and neck supple.  Lymphadenopathy:     Cervical: No cervical adenopathy.  Skin:    General: Skin is warm and dry.  Neurological:     Mental Status: He is alert and oriented to person, place, and time.     Deep Tendon Reflexes: Reflexes are normal and symmetric.  Psychiatric:        Behavior: Behavior normal.     Assessment & Plan:  Carl Gutierrez is a 74 y.o. male . Medicare annual wellness visit, subsequent  - - anticipatory guidance as below in AVS, screening labs if needed. Health maintenance items as above in HPI discussed/recommended  as applicable.  - no concerning responses on depression, fall, or functional status screening. Any positive responses noted as above. Advanced directives discussed as in CHL.   -Left knee pain discussed.  He declines further evaluation at this time.  Pool-based exercise option, Tylenol over-the-counter if needed.  RTC precautions  Type 2 diabetes mellitus with hyperglycemia, with long-term current use of insulin ([place])  -Continue follow-up with endocrinology.  Hyperlipidemia, unspecified hyperlipidemia type - Plan: Lipid panel, Comprehensive metabolic panel  -Tolerating statin, recent cardiology visit noted.  Check labs.  Essential hypertension - Plan: Lipid panel, Comprehensive metabolic panel  -Stable, prior elevating creatinine had stabilized.  Repeat testing.  Hypothyroidism, unspecified type - Plan: TSH Hypothyroidism - Plan: levothyroxine (SYNTHROID) 88 MCG tablet  -With panhypopituitarism.  Check TSH, continue same dose Synthroid for now with continue follow-up with endocrinology.  Meds ordered this encounter  Medications  . levothyroxine (SYNTHROID) 88 MCG tablet    Sig: Take 1 tablet (88 mcg total) by mouth daily.    Dispense:  90 tablet    Refill:  2   Patient Instructions   Tylenol as needed over the counter but I am happy to evaluate your knee when you are ready. Pool based exercise may be tolerated better. I do recommend repeat colonoscopy - let me know if you  change your mind.  Thanks for coming in today and take care!   Preventive Care 33 Years and Older, Male Preventive care refers to lifestyle choices and visits with your health care provider that can promote health and wellness. This includes:  A yearly physical exam. This is also called an annual well check.  Regular dental and eye exams.  Immunizations.  Screening for certain conditions.  Healthy lifestyle choices, such as diet and exercise. What can I expect for my preventive care visit? Physical  exam Your health care provider will check:  Height and weight. These may be used to calculate body mass index (BMI), which is a measurement that tells if you are at a healthy weight.  Heart rate and blood pressure.  Your skin for abnormal spots. Counseling Your health care provider may ask you questions about:  Alcohol, tobacco, and drug use.  Emotional well-being.  Home and relationship well-being.  Sexual activity.  Eating habits.  History of falls.  Memory and ability to understand (cognition).  Work and work Statistician. What immunizations do I need?  Influenza (flu) vaccine  This is recommended every year. Tetanus, diphtheria, and pertussis (Tdap) vaccine  You may need a Td booster every 10 years. Varicella (chickenpox) vaccine  You may need this vaccine if you have not already been vaccinated. Zoster (shingles) vaccine  You may need this after age 28. Pneumococcal conjugate (PCV13) vaccine  One dose is recommended after age 52. Pneumococcal polysaccharide (PPSV23) vaccine  One dose is recommended after age 47. Measles, mumps, and rubella (MMR) vaccine  You may need at least one dose of MMR if you were born in 1957 or later. You may also need a second dose. Meningococcal conjugate (MenACWY) vaccine  You may need this if you have certain conditions. Hepatitis A vaccine  You may need this if you have certain conditions or if you travel or work in places where you may be exposed to hepatitis A. Hepatitis B vaccine  You may need this if you have certain conditions or if you travel or work in places where you may be exposed to hepatitis B. Haemophilus influenzae type b (Hib) vaccine  You may need this if you have certain conditions. You may receive vaccines as individual doses or as more than one vaccine together in one shot (combination vaccines). Talk with your health care provider about the risks and benefits of combination vaccines. What tests do I  need? Blood tests  Lipid and cholesterol levels. These may be checked every 5 years, or more frequently depending on your overall health.  Hepatitis C test.  Hepatitis B test. Screening  Lung cancer screening. You may have this screening every year starting at age 65 if you have a 30-pack-year history of smoking and currently smoke or have quit within the past 15 years.  Colorectal cancer screening. All adults should have this screening starting at age 37 and continuing until age 34. Your health care provider may recommend screening at age 89 if you are at increased risk. You will have tests every 1-10 years, depending on your results and the type of screening test.  Prostate cancer screening. Recommendations will vary depending on your family history and other risks.  Diabetes screening. This is done by checking your blood sugar (glucose) after you have not eaten for a while (fasting). You may have this done every 1-3 years.  Abdominal aortic aneurysm (AAA) screening. You may need this if you are a current or former smoker.  Sexually transmitted disease (STD) testing. Follow these instructions at home: Eating and drinking  Eat a diet that includes fresh fruits and vegetables, whole grains, lean protein, and low-fat dairy products. Limit your intake of foods with high amounts of sugar, saturated fats, and salt.  Take vitamin and mineral supplements as recommended by your health care provider.  Do not drink alcohol if your health care provider tells you not to drink.  If you drink alcohol: ? Limit how much you have to 0-2 drinks a day. ? Be aware of how much alcohol is in your drink. In the [place] one drink equals one 12 oz bottle of beer (355 mL), one 5 oz glass of wine (148 mL), or one 1 oz glass of hard liquor (44 mL). Lifestyle  Take daily care of your teeth and gums.  Stay active. Exercise for at least 30 minutes on 5 or more days each week.  Do not use any products that  contain nicotine or tobacco, such as cigarettes, e-cigarettes, and chewing tobacco. If you need help quitting, ask your health care provider.  If you are sexually active, practice safe sex. Use a condom or other form of protection to prevent STIs (sexually transmitted infections).  Talk with your health care provider about taking a low-dose aspirin or statin. What's next?  Visit your health care provider once a year for a well check visit.  Ask your health care provider how often you should have your eyes and teeth checked.  Stay up to date on all vaccines. This information is not intended to replace advice given to you by your health care provider. Make sure you discuss any questions you have with your health care provider. Document Revised: 05/27/2018 Document Reviewed: 05/27/2018 Elsevier Patient Education  [employer].    If you have lab work done today you will be contacted with your lab results within the next 2 weeks.  If you have not heard from [place] then please contact us. The fastest way to get your results is to register for My Chart.   IF you received an x-ray today, you will receive an invoice from [facility]. Please contact [facility] at [phone] with questions or concerns regarding your invoice.   IF you received labwork today, you will receive an invoice from [facility]. Please contact [facility] at [phone] with questions or concerns regarding your invoice.   Our billing staff will not be able to assist you with questions regarding bills from these companies.  You will be contacted with the lab results as soon as they are available. The fastest way to get your results is to activate your My Chart account. Instructions are located on the last page of this paperwork. If you have not heard from [place] regarding the results in 2 weeks, please contact this office.         Signed, [name], MD [facility]

## 2019-11-29 LAB — COMPREHENSIVE METABOLIC PANEL
ALT: 11 IU/L (ref 0–44)
AST: 16 IU/L (ref 0–40)
Albumin/Globulin Ratio: 1.7 (ref 1.2–2.2)
Albumin: 4.3 g/dL (ref 3.7–4.7)
Alkaline Phosphatase: 82 IU/L (ref 48–121)
BUN/Creatinine Ratio: 14 (ref 10–24)
BUN: 20 mg/dL (ref 8–27)
Bilirubin Total: <0.2 mg/dL (ref 0.0–1.2)
CO2: 18 mmol/L — ABNORMAL LOW (ref 20–29)
Calcium: 9.5 mg/dL (ref 8.6–10.2)
Chloride: 105 mmol/L (ref 96–106)
Creatinine, Ser: 1.39 mg/dL — ABNORMAL HIGH (ref 0.76–1.27)
GFR calc Af Amer: 57 mL/min/{1.73_m2} — ABNORMAL LOW (ref >59)
GFR calc non Af Amer: 49 mL/min/{1.73_m2} — ABNORMAL LOW (ref >59)
Globulin, Total: 2.6 g/dL (ref 1.5–4.5)
Glucose: 111 mg/dL — ABNORMAL HIGH (ref 65–99)
Potassium: 4.7 mmol/L (ref 3.5–5.2)
Sodium: 140 mmol/L (ref 134–144)
Total Protein: 6.9 g/dL (ref 6.0–8.5)

## 2019-11-29 LAB — LIPID PANEL
Chol/HDL Ratio: 3.2 ratio (ref 0.0–5.0)
Cholesterol, Total: 120 mg/dL (ref 100–199)
HDL: 37 mg/dL — ABNORMAL LOW (ref >39)
LDL Chol Calc (NIH): 63 mg/dL (ref 0–99)
Triglycerides: 111 mg/dL (ref 0–149)
VLDL Cholesterol Cal: 20 mg/dL (ref 5–40)

## 2019-11-29 LAB — TSH: TSH: 2.92 u[IU]/mL (ref 0.450–4.500)

## 2019-12-27 ENCOUNTER — Other Ambulatory Visit: Payer: Self-pay | Admitting: Endocrinology

## 2019-12-28 ENCOUNTER — Other Ambulatory Visit (INDEPENDENT_AMBULATORY_CARE_PROVIDER_SITE_OTHER): Payer: Medicare Other

## 2019-12-28 ENCOUNTER — Other Ambulatory Visit: Payer: Self-pay

## 2019-12-28 DIAGNOSIS — E669 Obesity, unspecified: Secondary | ICD-10-CM

## 2019-12-28 DIAGNOSIS — E782 Mixed hyperlipidemia: Secondary | ICD-10-CM

## 2019-12-28 DIAGNOSIS — E1169 Type 2 diabetes mellitus with other specified complication: Secondary | ICD-10-CM

## 2019-12-28 LAB — LIPID PANEL
Cholesterol: 95 mg/dL (ref 0–200)
HDL: 31.9 mg/dL — ABNORMAL LOW (ref >39.00)
LDL Cholesterol: 43 mg/dL (ref 0–99)
NonHDL: 63.19
Total CHOL/HDL Ratio: 3
Triglycerides: 100 mg/dL (ref 0.0–149.0)
VLDL: 20 mg/dL (ref 0.0–40.0)

## 2019-12-28 LAB — COMPREHENSIVE METABOLIC PANEL
ALT: 11 U/L (ref 0–53)
AST: 12 U/L (ref 0–37)
Albumin: 3.9 g/dL (ref 3.5–5.2)
Alkaline Phosphatase: 69 U/L (ref 39–117)
BUN: 21 mg/dL (ref 6–23)
CO2: 24 mEq/L (ref 19–32)
Calcium: 8.8 mg/dL (ref 8.4–10.5)
Chloride: 109 mEq/L (ref 96–112)
Creatinine, Ser: 1.44 mg/dL (ref 0.40–1.50)
GFR: 57.7 mL/min — ABNORMAL LOW (ref >60.00)
Glucose, Bld: 103 mg/dL — ABNORMAL HIGH (ref 70–99)
Potassium: 4 mEq/L (ref 3.5–5.1)
Sodium: 139 mEq/L (ref 135–145)
Total Bilirubin: 0.2 mg/dL (ref 0.2–1.2)
Total Protein: 6.7 g/dL (ref 6.0–8.3)

## 2019-12-28 LAB — HEMOGLOBIN A1C: Hgb A1c MFr Bld: 7 % — ABNORMAL HIGH (ref 4.6–6.5)

## 2020-01-02 ENCOUNTER — Other Ambulatory Visit: Payer: Self-pay | Admitting: Cardiology

## 2020-01-03 NOTE — Progress Notes (Signed)
- Patient ID: Carl Gutierrez, male   DOB: 09-30-1945, 74 y.o.   MRN: [id number]           Reason for Appointment: Endocrinology follow-up  History of Present Illness   DIABETES:  Recent history:     INSULIN regimen: Humalog 75/25 insulin, 20 units before breakfast and 6 before dinner  Non-insulin hypoglycemic drugs: Janumet XR 100/1000 daily  His A1c is 7, last 6.9  Current management, blood sugar patterns and problems identified:  He has relatively higher readings after meals although only couple of readings available after dinner  He thinks that ice cream and milk will bring his blood sugar go up  However does not know what else would make her sugar go up in the evenings or sometimes after lunch   His insurance prefers Humalog instead of NovoLog, previously on 70/30  He does take his dinnertime insulin before eating  Today he has some difficulty with walking because of knee pain  Not able to lose any weight  Metformin dose has been somewhat limited because of variable renal function  Side effects from medications: None      Monitors blood glucose:  Once or twice a day.    Glucometer:  One Touch           Blood Glucose readings from download   PRE-MEAL Fasting Lunch Dinner Bedtime Overall  Glucose range:  82-115   134    Mean/median:  100    132   POST-MEAL PC Breakfast PC Lunch PC Dinner  Glucose range:  81-170  159-193  189, 196  Mean/median:      PREVIOUS readings:  Blood sugar range 76-200 AVERAGE 117 with morning average 91   Dietician visit: Most recent: 11/2012     Weight history   Wt Readings from Last 3 Encounters:  01/04/20 231 lb 12.8 oz (105.1 kg)  11/28/19 231 lb (104.8 kg)  09/22/19 232 lb 6.4 oz (105.4 kg)           Diabetes labs:  Lab Results  Component Value Date   HGBA1C 7.0 (H) 12/28/2019   HGBA1C 6.9 (H) 08/10/2019   HGBA1C 6.8 (H) 05/03/2019   Lab Results  Component Value Date   MICROALBUR 5.6 (H) 08/10/2019   LDLCALC  43 12/28/2019   CREATININE 1.44 12/28/2019   Other problems discussed today: See review of systems  Allergies as of 01/04/2020      Reactions   Hydrochlorothiazide    Leg and side pain with this   Other    Blood pressure med name unknown.      Medication List       Accurate as of January 04, 2020  8:38 AM. If you have any questions, ask your nurse or doctor.        amLODipine 10 MG tablet Commonly known as: NORVASC TAKE 1 TABLET BY MOUTH EVERY DAY   aspirin 81 MG tablet Take 81 mg by mouth daily. Reported on 09/05/2015   B-D UF III MINI PEN NEEDLES 31G X 5 MM Misc Generic drug: Insulin Pen Needle USE FOR INSULIN PEN TWICE A DAY   hydrocortisone 20 MG tablet Commonly known as: CORTEF Take 10-20 mg by mouth 2 (two) times daily. Takes 1 tablet in the morning and 1/2 tablet at night   Insulin Lispro Prot & Lispro (75-25) 100 UNIT/ML Kwikpen Commonly known as: HUMALOG 75/25 MIX INJECT 20 UNITS UNDER THE SKIN BEFORE BREAKFAST AND 8 UNITS BEFORE SUPPER. (REPLACES 70/30)  Janumet XR [phone] MG Tb24 Generic drug: SitaGLIPtin-MetFORMIN HCl TAKE 1 TABLET BY MOUTH EVERY DAY   levothyroxine 88 MCG tablet Commonly known as: SYNTHROID Take 1 tablet (88 mcg total) by mouth daily.   lisinopril 10 MG tablet Commonly known as: ZESTRIL Take 1 tablet (10 mg total) by mouth daily.   metoprolol succinate 25 MG 24 hr tablet Commonly known as: TOPROL-XL TAKE 1 TABLET BY MOUTH EVERY DAY   OneTouch Verio test strip Generic drug: glucose blood USE ONETOUCH VERIO TEST STRIPS AS INSTRUCTED TO CHECK BLOOD SUGAR ONCE DAILY.DX:E11.65   rosuvastatin 40 MG tablet Commonly known as: CRESTOR TAKE 1 TABLET BY MOUTH EVERY DAY       Allergies:  Allergies  Allergen Reactions  . Hydrochlorothiazide     Leg and side pain with this  . Other     Blood pressure med name unknown.    Past Medical History:  Diagnosis Date  . CAD S/P percutaneous coronary angioplasty February 2004; 2008    PCI-dLAD: 2.25 mm x 16 mm Express BMS; Ramus- 2.75 mm x 18 mm Cypher DES (2.8 mm); follow-up 11/'08: Patent LAD/ramus stents. 90% small OM. EF 50-60%.; Myoview 1/'12: Exercise 9 min, 10 METS with no ischemia/infarction.   . Clotting disorder ([place])   . Diabetes mellitus    Low-dose metformin  . DJD (degenerative joint disease), lumbosacral     status post L4-L5 surgery  . Essential hypertension   . History of DVT of lower extremity     postoperative  . Hyperlipidemia with target LDL less than 70   . Hypothyroidism    Synthroid  . Panhypopituitarism ([place])    Status post pituitary adenoma removal in 2012  . Recurrent deep vein thrombosis (DVT) ([place]) 10/01/2015    Past Surgical History:  Procedure Laterality Date  . BRAIN SURGERY  2012   Removal of pituitary adenoma  . CARDIAC CATHETERIZATION  November 2008   Significant LAD tapering but no significant disease the distal stent. Patent Ramus stent. 90% lesion in small OM 2; small nondominant RCA. Medical therapy. EF 50-60%.  . CORONARY ANGIOPLASTY WITH STENT PLACEMENT  February 2004   PCI-dLAD: 2.25 mm x 16 mm BMS;;PCI- RI: 2.75 mm 18 mm Cypher DES  . NM MYOVIEW LTD  January 2012   Exercise ~9 min - 10 METS; no ischemia or infarction  . PROSTATECTOMY N/A 08/13/2015   Procedure: TRANSVESICLE OPEN PROSTATECTOMY** ;  Surgeon: Carolan Clines, MD;  Location: [facility];  Service: Urology;  Laterality: N/A;  . SPINE SURGERY     L4-L5    Family History  Problem Relation Age of Onset  . Cancer Mother        pancreatic ca    Social History:  reports that he quit smoking about 26 years ago. He has never used smokeless tobacco. He reports that he does not drink alcohol and does not use drugs.  Review of Systems:  Hypertension:  He is taking 5 mg amlodipine and lisinopril 10 mg, previously prescribed by his cardiologist On his last visit his blood pressure was low normal and amlodipine was reduced to half of the 10 mg dose  Blood pressure  is normal and he does not complain of orthostatic lightheadedness   BP Readings from Last 3 Encounters:  01/04/20 124/76  11/28/19 130/80  09/22/19 118/68   Renal function history: His renal function improved slightly with reducing his lisinopril   No proteinuria present  Lab Results  Component Value Date   CREATININE  1.44 12/28/2019   CREATININE 1.39 (H) 11/28/2019   CREATININE 1.55 (H) 09/19/2019    HYPOPITUITARISM: He has been taking levothyroxine and Cortef as prescribed Takes Cortef as directed in divided doses in the morning and early evening regularly each day No unusual change in appetite or weakness  Free T4 has been consistently normal with  dosage of 88 mcg levothyroxine  Lab Results  Component Value Date   TSH 2.920 11/28/2019   TSH 2.960 11/23/2018   TSH 4.09 01/29/2018   FREET4 0.96 08/10/2019   FREET4 1.09 05/03/2019   FREET4 1.24 10/26/2018       LABS:  No visits with results within 1 Week(s) from this visit.  Latest known visit with results is:  Lab on 12/28/2019  Component Date Value Ref Range Status  . Cholesterol 12/28/2019 95  0 - 200 mg/dL Final   ATP III Classification       Desirable:  < 200 mg/dL               Borderline High:  200 - 239 mg/dL          High:  > = 240 mg/dL  . Triglycerides 12/28/2019 100.0  0 - 149 mg/dL Final   Normal:  <150 mg/dLBorderline High:  150 - 199 mg/dL  . HDL 12/28/2019 31.90* >39.00 mg/dL Final  . VLDL 12/28/2019 20.0  0.0 - 40.0 mg/dL Final  . LDL Cholesterol 12/28/2019 43  0 - 99 mg/dL Final  . Total CHOL/HDL Ratio 12/28/2019 3   Final                  Men          Women1/2 Average Risk     3.4          3.3Average Risk          5.0          4.42X Average Risk          9.6          7.13X Average Risk          15.0          11.0                      . NonHDL 12/28/2019 63.19   Final   NOTE:  Non-HDL goal should be 30 mg/dL higher than patient's LDL goal (i.e. LDL goal of < 70 mg/dL, would have non-HDL goal of <  100 mg/dL)  . Sodium 12/28/2019 139  135 - 145 mEq/L Final  . Potassium 12/28/2019 4.0  3.5 - 5.1 mEq/L Final  . Chloride 12/28/2019 109  96 - 112 mEq/L Final  . CO2 12/28/2019 24  19 - 32 mEq/L Final  . Glucose, Bld 12/28/2019 103* 70 - 99 mg/dL Final  . BUN 12/28/2019 21  6 - 23 mg/dL Final  . Creatinine, Ser 12/28/2019 1.44  0.40 - 1.50 mg/dL Final  . Total Bilirubin 12/28/2019 0.2  0.2 - 1.2 mg/dL Final  . Alkaline Phosphatase 12/28/2019 69  39 - 117 U/L Final  . AST 12/28/2019 12  0 - 37 U/L Final  . ALT 12/28/2019 11  0 - 53 U/L Final  . Total Protein 12/28/2019 6.7  6.0 - 8.3 g/dL Final  . Albumin 12/28/2019 3.9  3.5 - 5.2 g/dL Final  . GFR 12/28/2019 57.70* >60.00 mL/min Final  . Calcium 12/28/2019 8.8  8.4 - 10.5 mg/dL Final  .  Hgb A1c MFr Bld 12/28/2019 7.0* 4.6 - 6.5 % Final   Glycemic Control Guidelines for People with Diabetes:Non Diabetic:  <6%Goal of Therapy: <7%Additional Action Suggested:  >8%      Examination:   BP 124/76 (BP Location: Left Arm, Patient Position: Sitting, Cuff Size: Large)   Pulse 60   Ht 5\' 11"  (1.803 m)   Wt 231 lb 12.8 oz (105.1 kg)   SpO2 95%   BMI 32.33 kg/m   Body mass index is 32.33 kg/m.    ASSESSMENT/ PLAN:    HYPERTENSION: His blood pressure is not as low He will continue 5 mg amlodipine along with 10 mg lisinopril    Diabetes type 2 with mild obesity:   See history of present illness for detailed discussion of current diabetes management, blood sugar patterns and problems identified  He is on premixed insulin twice a day started when he had marked hyperglycemia in 09/2018 Also on Janumet XR 100/1000  Blood sugars are tending to be high after meals but is not checking these enough Still not losing weight   RENAL dysfunction: No history of microalbuminuria, no intake of nonsteroidal drugs Creatinine is about the same as it was 8 months ago and likely is his new baseline now Need to avoid low blood pressure  readings  Hypopituitarism: Adequately replaced and will need follow-up free T4 level on the next visit  Statin therapy: He is on high-dose statin because of history of coronary disease, to be followed up by Dr. Ellyn Hack annually   PLAN:  No change in medication  Consider 70/30 insulin in January if covered by insurance at that time  May also consider adding Prandin at dinnertime if blood sugars are consistently high in the evening  Encouraged him to start being more active with more walking  Try to identify what foods make the blood sugar go up in the evenings after dinner  Although is a good candidate for Jardiance this may affect renal function  Continue Crestor unchanged  There are no Patient Instructions on file for this visit.     Elayne Snare 01/04/2020, 8:38 AM

## 2020-01-04 ENCOUNTER — Other Ambulatory Visit: Payer: Self-pay

## 2020-01-04 ENCOUNTER — Encounter: Payer: Self-pay | Admitting: Endocrinology

## 2020-01-04 ENCOUNTER — Ambulatory Visit (INDEPENDENT_AMBULATORY_CARE_PROVIDER_SITE_OTHER): Payer: Medicare Other | Admitting: Endocrinology

## 2020-01-04 VITALS — BP 120/68 | HR 60 | Ht 71.0 in | Wt 231.8 lb

## 2020-01-04 DIAGNOSIS — E230 Hypopituitarism: Secondary | ICD-10-CM

## 2020-01-04 DIAGNOSIS — I10 Essential (primary) hypertension: Secondary | ICD-10-CM | POA: Diagnosis not present

## 2020-01-04 DIAGNOSIS — N1831 Chronic kidney disease, stage 3a: Secondary | ICD-10-CM | POA: Diagnosis not present

## 2020-01-04 DIAGNOSIS — E1165 Type 2 diabetes mellitus with hyperglycemia: Secondary | ICD-10-CM | POA: Diagnosis not present

## 2020-01-04 NOTE — Patient Instructions (Signed)
Check blood sugars on waking up 2-3 days a week  Also check blood sugars about 2 hours after meals and do this after different meals by rotation  Recommended blood sugar levels on waking up are 90-130 and about 2 hours after meal is 130-180  Please bring your blood sugar monitor to each visit, thank you   

## 2020-02-16 ENCOUNTER — Other Ambulatory Visit: Payer: Self-pay | Admitting: Endocrinology

## 2020-05-01 ENCOUNTER — Other Ambulatory Visit (INDEPENDENT_AMBULATORY_CARE_PROVIDER_SITE_OTHER): Payer: Medicare Other

## 2020-05-01 ENCOUNTER — Other Ambulatory Visit: Payer: Self-pay

## 2020-05-01 DIAGNOSIS — E1165 Type 2 diabetes mellitus with hyperglycemia: Secondary | ICD-10-CM

## 2020-05-01 DIAGNOSIS — E230 Hypopituitarism: Secondary | ICD-10-CM

## 2020-05-01 LAB — COMPREHENSIVE METABOLIC PANEL
ALT: 15 U/L (ref 0–53)
AST: 16 U/L (ref 0–37)
Albumin: 4 g/dL (ref 3.5–5.2)
Alkaline Phosphatase: 68 U/L (ref 39–117)
BUN: 23 mg/dL (ref 6–23)
CO2: 25 mEq/L (ref 19–32)
Calcium: 9.2 mg/dL (ref 8.4–10.5)
Chloride: 107 mEq/L (ref 96–112)
Creatinine, Ser: 1.34 mg/dL (ref 0.40–1.50)
GFR: 51.49 mL/min — ABNORMAL LOW (ref >60.00)
Glucose, Bld: 118 mg/dL — ABNORMAL HIGH (ref 70–99)
Potassium: 4.6 mEq/L (ref 3.5–5.1)
Sodium: 140 mEq/L (ref 135–145)
Total Bilirubin: 0.3 mg/dL (ref 0.2–1.2)
Total Protein: 7 g/dL (ref 6.0–8.3)

## 2020-05-01 LAB — MICROALBUMIN / CREATININE URINE RATIO
Creatinine,U: 80.4 mg/dL
Microalb Creat Ratio: 4.9 mg/g (ref 0.0–30.0)
Microalb, Ur: 4 mg/dL — ABNORMAL HIGH (ref 0.0–1.9)

## 2020-05-01 LAB — T4, FREE: Free T4: 1.1 ng/dL (ref 0.60–1.60)

## 2020-05-01 LAB — HEMOGLOBIN A1C: Hgb A1c MFr Bld: 7.4 % — ABNORMAL HIGH (ref 4.6–6.5)

## 2020-05-03 NOTE — Progress Notes (Signed)
- Patient ID: Carl Gutierrez, male   DOB: [birth year], 74 y.o.   MRN: [id number]           Reason for Appointment: Endocrinology follow-up  History of Present Illness   DIABETES:  Recent history:     INSULIN regimen: Humalog 75/25 insulin, 20 units before breakfast and 6 before dinner  Non-insulin hypoglycemic drugs: Janumet XR 100/1000 daily  His A1c is 7.4 compared to 7%  ,  Current management, blood sugar patterns and problems identified:  He had significantly higher readings recently especially after meals and some over 200 which he thinks was from eating out and not watching his diet while on vacation at the beach.  Otherwise his most of his blood sugars are looking fairly good  Not clear why his A1c has gone up  No hypoglycemia also  Fasting readings are relatively better and he may have somewhat higher readings later in the day including after dinner  As before has some difficulty with walking because of knee pain and does not exercise much  Not able to lose any weight and weight is up 2 pounds  Metformin dose has been somewhat limited because of variable renal function  Side effects from medications: None      Monitors blood glucose:  Once or twice a day.    Glucometer:  One Touch           Blood Glucose readings from download   PRE-MEAL Fasting Lunch Dinner Bedtime Overall  Glucose range:  80-116  114-200     Mean/median:  104  158    144   POST-MEAL PC Breakfast PC Lunch PC Dinner  Glucose range:   160-207  131-188  Mean/median:   182  152   PREVIOUS readings:  PRE-MEAL Fasting Lunch Dinner Bedtime Overall  Glucose range:  82-115   134    Mean/median:  100    132   POST-MEAL PC Breakfast PC Lunch PC Dinner  Glucose range:  81-170  159-193  189, 196  Mean/median:        Dietician visit: Most recent: 11/2012     Weight history   Wt Readings from Last 3 Encounters:  05/04/20 234 lb 6.4 oz (106.3 kg)  01/04/20 231 lb 12.8 oz (105.1 kg)   11/28/19 231 lb (104.8 kg)           Diabetes labs:  Lab Results  Component Value Date   HGBA1C 7.4 (H) 05/01/2020   HGBA1C 7.0 (H) 12/28/2019   HGBA1C 6.9 (H) 08/10/2019   Lab Results  Component Value Date   MICROALBUR 4.0 (H) 05/01/2020   LDLCALC 43 12/28/2019   CREATININE 1.34 05/01/2020   Other problems discussed today: See review of systems  Allergies as of 05/04/2020      Reactions   Hydrochlorothiazide    Leg and side pain with this   Other    Blood pressure med name unknown.      Medication List       Accurate as of May 04, 2020  8:33 AM. If you have any questions, ask your nurse or doctor.        amLODipine 10 MG tablet Commonly known as: NORVASC TAKE 1 TABLET BY MOUTH EVERY DAY   aspirin 81 MG tablet Take 81 mg by mouth daily. Reported on 09/05/2015   B-D UF III MINI PEN NEEDLES 31G X 5 MM Misc Generic drug: Insulin Pen Needle USE FOR INSULIN PEN TWICE A DAY   hydrocortisone  20 MG tablet Commonly known as: CORTEF Take 10-20 mg by mouth 2 (two) times daily. Takes 1 tablet in the morning and 1/2 tablet at night   Insulin Lispro Prot & Lispro (75-25) 100 UNIT/ML Kwikpen Commonly known as: HUMALOG 75/25 MIX INJECT 20 UNITS UNDER THE SKIN BEFORE BREAKFAST AND 8 UNITS BEFORE SUPPER. (REPLACES 70/30) What changed: See the new instructions.   Janumet XR [phone] MG Tb24 Generic drug: SitaGLIPtin-MetFORMIN HCl TAKE 1 TABLET BY MOUTH EVERY DAY   levothyroxine 88 MCG tablet Commonly known as: SYNTHROID Take 1 tablet (88 mcg total) by mouth daily.   lisinopril 10 MG tablet Commonly known as: ZESTRIL Take 1 tablet (10 mg total) by mouth daily.   metoprolol succinate 25 MG 24 hr tablet Commonly known as: TOPROL-XL TAKE 1 TABLET BY MOUTH EVERY DAY   OneTouch Verio test strip Generic drug: glucose blood USE ONETOUCH VERIO TEST STRIPS AS INSTRUCTED TO CHECK BLOOD SUGAR ONCE DAILY.DX:E11.65   rosuvastatin 40 MG tablet Commonly known as:  CRESTOR TAKE 1 TABLET BY MOUTH EVERY DAY       Allergies:  Allergies  Allergen Reactions  . Hydrochlorothiazide     Leg and side pain with this  . Other     Blood pressure med name unknown.    Past Medical History:  Diagnosis Date  . CAD S/P percutaneous coronary angioplasty February 2004; 2008   PCI-dLAD: 2.25 mm x 16 mm Express BMS; Ramus- 2.75 mm x 18 mm Cypher DES (2.8 mm); follow-up 11/'08: Patent LAD/ramus stents. 90% small OM. EF 50-60%.; Myoview 1/'12: Exercise 9 min, 10 METS with no ischemia/infarction.   . Clotting disorder ([place])   . Diabetes mellitus    Low-dose metformin  . DJD (degenerative joint disease), lumbosacral     status post L4-L5 surgery  . Essential hypertension   . History of DVT of lower extremity     postoperative  . Hyperlipidemia with target LDL less than 70   . Hypothyroidism    Synthroid  . Panhypopituitarism ([place])    Status post pituitary adenoma removal in 2012  . Recurrent deep vein thrombosis (DVT) ([place]) 10/01/2015    Past Surgical History:  Procedure Laterality Date  . BRAIN SURGERY  2012   Removal of pituitary adenoma  . CARDIAC CATHETERIZATION  November 2008   Significant LAD tapering but no significant disease the distal stent. Patent Ramus stent. 90% lesion in small OM 2; small nondominant RCA. Medical therapy. EF 50-60%.  . CORONARY ANGIOPLASTY WITH STENT PLACEMENT  February 2004   PCI-dLAD: 2.25 mm x 16 mm BMS;;PCI- RI: 2.75 mm 18 mm Cypher DES  . NM MYOVIEW LTD  January 2012   Exercise ~9 min - 10 METS; no ischemia or infarction  . PROSTATECTOMY N/A 08/13/2015   Procedure: TRANSVESICLE OPEN PROSTATECTOMY** ;  Surgeon: Carolan Clines, MD;  Location: [facility];  Service: Urology;  Laterality: N/A;  . SPINE SURGERY     L4-L5    Family History  Problem Relation Age of Onset  . Cancer Mother        pancreatic ca    Social History:  reports that he quit smoking about 26 years ago. He has never used smokeless tobacco. He  reports that he does not drink alcohol and does not use drugs.  Review of Systems:  Hypertension:  He is taking 5 mg amlodipine and lisinopril 10 mg, previously prescribed by his cardiologist Previously amlodipine was reduced to half of the 10 mg dose  Blood  pressure is sometimes checked at home but he does not remember the readings  Has no dizziness on standing up   BP Readings from Last 3 Encounters:  05/04/20 128/82  01/04/20 120/68  11/28/19 130/80   Renal function history:   Has not had diabetic nephropathy Creatinine is slightly variable   Lab Results  Component Value Date   CREATININE 1.34 05/01/2020   CREATININE 1.44 12/28/2019   CREATININE 1.39 (H) 11/28/2019    HYPOPITUITARISM: He has been taking levothyroxine and Cortef as prescribed Takes Cortef 30 mg total as directed in divided doses in the morning and early evening regularly No unusual change in appetite or weakness  Free T4 has been consistently normal with  dosage of 88 mcg levothyroxine  Lab Results  Component Value Date   TSH 2.920 11/28/2019   TSH 2.960 11/23/2018   TSH 4.09 01/29/2018   FREET4 1.10 05/01/2020   FREET4 0.96 08/10/2019   FREET4 1.09 05/03/2019    Eye exams: He gets this done at the [facility] and no reports available    LABS:  Lab on 05/01/2020  Component Date Value Ref Range Status  . Free T4 05/01/2020 1.10  0.60 - 1.60 ng/dL Final   Comment: Specimens from patients who are undergoing biotin therapy and /or ingesting biotin supplements may contain high levels of biotin.  The higher biotin concentration in these specimens interferes with this Free T4 assay.  Specimens that contain high levels  of biotin may cause false high results for this Free T4 assay.  Please interpret results in light of the total clinical presentation of the patient.    Jacquelyne Balint, Ur 05/01/2020 4.0* 0.0 - 1.9 mg/dL Final  . Creatinine,U 05/01/2020 80.4  mg/dL Final  . Microalb Creat Ratio  05/01/2020 4.9  0.0 - 30.0 mg/g Final  . Sodium 05/01/2020 140  135 - 145 mEq/L Final  . Potassium 05/01/2020 4.6  3.5 - 5.1 mEq/L Final  . Chloride 05/01/2020 107  96 - 112 mEq/L Final  . CO2 05/01/2020 25  19 - 32 mEq/L Final  . Glucose, Bld 05/01/2020 118* 70 - 99 mg/dL Final  . BUN 05/01/2020 23  6 - 23 mg/dL Final  . Creatinine, Ser 05/01/2020 1.34  0.40 - 1.50 mg/dL Final  . Total Bilirubin 05/01/2020 0.3  0.2 - 1.2 mg/dL Final  . Alkaline Phosphatase 05/01/2020 68  39 - 117 U/L Final  . AST 05/01/2020 16  0 - 37 U/L Final  . ALT 05/01/2020 15  0 - 53 U/L Final  . Total Protein 05/01/2020 7.0  6.0 - 8.3 g/dL Final  . Albumin 05/01/2020 4.0  3.5 - 5.2 g/dL Final  . GFR 05/01/2020 51.49* >60.00 mL/min Final   Calculated using the CKD-EPI Creatinine Equation (2021)  . Calcium 05/01/2020 9.2  8.4 - 10.5 mg/dL Final  . Hgb A1c MFr Bld 05/01/2020 7.4* 4.6 - 6.5 % Final   Glycemic Control Guidelines for People with Diabetes:Non Diabetic:  <6%Goal of Therapy: <7%Additional Action Suggested:  >8%      Examination:   BP 128/82   Pulse 62   Ht 5\' 11"  (1.803 m)   Wt 234 lb 6.4 oz (106.3 kg)   SpO2 95%   BMI 32.69 kg/m   Body mass index is 32.69 kg/m.    ASSESSMENT/ PLAN:    HYPERTENSION: His blood pressure is well controlled He will continue 5 mg amlodipine along with 10 mg lisinopril    Diabetes type 2 with  mild obesity:   See history of present illness for detailed discussion of current diabetes management, blood sugar patterns and problems identified  A1c is relatively higher at 7.4  He is on premixed insulin twice a day started when he had marked hyperglycemia in 09/2018 Also on Janumet XR 100/1000 as before  Not clear why his A1c is higher since he has had high sugars only for a few days when he was going off his diet on vacation Still has somewhat higher readings after meals but fasting readings are fairly good with only 6 units of insulin at suppertime   RENAL  dysfunction: May be related to nephrosclerosis and creatinine is slightly better  Hypopituitarism: Adequately replaced with hydrocortisone and levothyroxine Free T4 normal  Needs eye exam follow-up  PLAN:  No change levothyroxine or hydrocortisone  Emphasized the need to start exercising as this will help with weight and blood sugar control  Sincerely likely can go to the [employer] on [address] sneakers encouraged him to start going there and do a recumbent bike or other comparable exercises for his knee pain  Consistent diet  Check more readings after meals and call if staying persistently high  If A1c not improved may consider switching Janumet to Synjardy  No change in amlodipine and lisinopril  We will forward results of his next eye exam  There are no Patient Instructions on file for this visit.     Elayne Snare 05/04/2020, 8:33 AM

## 2020-05-04 ENCOUNTER — Ambulatory Visit (INDEPENDENT_AMBULATORY_CARE_PROVIDER_SITE_OTHER): Payer: Medicare Other | Admitting: Endocrinology

## 2020-05-04 ENCOUNTER — Encounter: Payer: Self-pay | Admitting: Endocrinology

## 2020-05-04 ENCOUNTER — Other Ambulatory Visit: Payer: Self-pay

## 2020-05-04 VITALS — BP 120/72 | HR 62 | Ht 71.0 in | Wt 234.4 lb

## 2020-05-04 DIAGNOSIS — I10 Essential (primary) hypertension: Secondary | ICD-10-CM | POA: Diagnosis not present

## 2020-05-04 DIAGNOSIS — E230 Hypopituitarism: Secondary | ICD-10-CM

## 2020-05-04 DIAGNOSIS — E1165 Type 2 diabetes mellitus with hyperglycemia: Secondary | ICD-10-CM

## 2020-05-04 DIAGNOSIS — Z794 Long term (current) use of insulin: Secondary | ICD-10-CM | POA: Diagnosis not present

## 2020-05-04 NOTE — Patient Instructions (Addendum)
Go to Gym!  Can use bike etc.

## 2020-05-28 ENCOUNTER — Other Ambulatory Visit: Payer: Self-pay

## 2020-05-28 ENCOUNTER — Ambulatory Visit (INDEPENDENT_AMBULATORY_CARE_PROVIDER_SITE_OTHER): Payer: Medicare Other | Admitting: Family Medicine

## 2020-05-28 ENCOUNTER — Encounter: Payer: Self-pay | Admitting: Family Medicine

## 2020-05-28 VITALS — BP 126/82 | HR 60 | Temp 97.4°F | Ht 71.0 in | Wt 237.0 lb

## 2020-05-28 DIAGNOSIS — E785 Hyperlipidemia, unspecified: Secondary | ICD-10-CM

## 2020-05-28 DIAGNOSIS — I10 Essential (primary) hypertension: Secondary | ICD-10-CM | POA: Diagnosis not present

## 2020-05-28 DIAGNOSIS — Z794 Long term (current) use of insulin: Secondary | ICD-10-CM

## 2020-05-28 DIAGNOSIS — E039 Hypothyroidism, unspecified: Secondary | ICD-10-CM | POA: Diagnosis not present

## 2020-05-28 DIAGNOSIS — E1165 Type 2 diabetes mellitus with hyperglycemia: Secondary | ICD-10-CM

## 2020-05-28 MED ORDER — ROSUVASTATIN CALCIUM 40 MG PO TABS
40.0000 mg | ORAL_TABLET | Freq: Every day | ORAL | 1 refills | Status: DC
Start: 2020-05-28 — End: 2020-12-05

## 2020-05-28 NOTE — Progress Notes (Signed)
Subjective:  Patient ID: Carl Gutierrez, male    DOB: 1946-05-05  Age: 74 y.o. MRN: [id number]  CC:  Chief Complaint  Patient presents with  . Follow-up    Hypertension,hyperlipidemia, diabetes, and hypothyroidism. Pt reports no issues with these conditions since last OV. Pt reports his home BP and BS readings are typically good. They fluctuate, but are normally good. PT reports no physical symptoms of these conditions at this time.    HPI Carl Gutierrez presents for   Diabetes: Uncontrolled associated with hyperglycemia, CKD followed by endocrinology, Dr. Dwyane Dee, last visit November 19.  Increased exercise recommended.  Option of switching Janumet to [place] if A1c not improved at that time.  Currently taking Janumet 100/1000 daily, 75/25 insulin - 20u QAM, 8u QPM. he is on ACE inhibitor and statin. Microalbumin: Normal ratio 05/01/2020 Optho, foot exam, pneumovax: Ophthalmology exam at the [facility] about 4-5 months ago?  Fasting: 98-120 2hr postprandial: 180-200.   Lab Results  Component Value Date   HGBA1C 7.4 (H) 05/01/2020   HGBA1C 7.0 (H) 12/28/2019   HGBA1C 6.9 (H) 08/10/2019   Lab Results  Component Value Date   MICROALBUR 4.0 (H) 05/01/2020   LDLCALC 43 12/28/2019   CREATININE 1.34 05/01/2020   Hypertension: Lisinopril 10 mg daily, amlodipine 10 mg daily.  Toprol-XL 25 mg daily.  Cardiology Dr. Ellyn Hack.  Has not taken meds yet today.  No missed doses.  Home readings: high of 170/70 range, usually under 140/90.   No new side effects.  Rarely lightheaded if standing up quickly. No syncope.  BP Readings from Last 3 Encounters:  05/28/20 (!) 145/77  05/04/20 120/72  01/04/20 120/68   Lab Results  Component Value Date   CREATININE 1.34 05/01/2020   Hyperlipidemia: Crestor 40 mg daily.  History of CAD status post PTCA. No new myalgias. Fasting.  Lab Results  Component Value Date   CHOL 95 12/28/2019   HDL 31.90 (L) 12/28/2019   LDLCALC 43 12/28/2019   LDLDIRECT 85.0  05/18/2014   TRIG 100.0 12/28/2019   CHOLHDL 3 12/28/2019   Lab Results  Component Value Date   ALT 15 05/01/2020   AST 16 05/01/2020   ALKPHOS 68 05/01/2020   BILITOT 0.3 05/01/2020    Hypothyroidism Synthroid 88 mcg daily. Lab Results  Component Value Date   TSH 2.920 11/28/2019  Taking medication daily.  No new hot or cold intolerance. No new hair or skin changes, heart palpitations or new fatigue. No new weight changes.     History Patient Active Problem List   Diagnosis Date Noted  . Stage 3a chronic kidney disease ([place]) 01/04/2020  . Anemia in chronic kidney disease 11/30/2015  . Recurrent deep vein thrombosis (DVT) ([place]) 10/01/2015  . Hematuria, unspecified 10/01/2015  . Elevated serum creatinine 10/01/2015  . BPH (benign prostatic hyperplasia) 08/13/2015  . Benign localized hyperplasia of prostate with urinary obstruction 07/23/2015  . Preop cardiovascular exam 07/17/2015  . Encounter for CDL (commercial driving license) exam 96/29/5284  . Obesity (BMI 30-39.9) 06/26/2013  . Essential hypertension 06/26/2013  . Panhypopituitarism ([place]) 11/17/2011  .   11/17/2011  . Diabetes mellitus ([place]) 11/17/2011  . Hyperlipidemia LDL goal <70 11/17/2011  . CAD S/P percutaneous coronary angioplasty 07/19/2002   Past Medical History:  Diagnosis Date  . CAD S/P percutaneous coronary angioplasty February 2004; 2008   PCI-dLAD: 2.25 mm x 16 mm Express BMS; Ramus- 2.75 mm x 18 mm Cypher DES (2.8 mm); follow-up 11/'08: Patent LAD/ramus stents. 90% small  OM. EF 50-60%.; Myoview 1/'12: Exercise 9 min, 10 METS with no ischemia/infarction.   . Clotting disorder ([place])   . Diabetes mellitus    Low-dose metformin  . DJD (degenerative joint disease), lumbosacral     status post L4-L5 surgery  . Essential hypertension   . History of DVT of lower extremity     postoperative  . Hyperlipidemia with target LDL less than 70   . Hypothyroidism    Synthroid  . Panhypopituitarism ([place])     Status post pituitary adenoma removal in 2012  . Recurrent deep vein thrombosis (DVT) ([place]) 10/01/2015   Past Surgical History:  Procedure Laterality Date  . BRAIN SURGERY  2012   Removal of pituitary adenoma  . CARDIAC CATHETERIZATION  November 2008   Significant LAD tapering but no significant disease the distal stent. Patent Ramus stent. 90% lesion in small OM 2; small nondominant RCA. Medical therapy. EF 50-60%.  . CORONARY ANGIOPLASTY WITH STENT PLACEMENT  February 2004   PCI-dLAD: 2.25 mm x 16 mm BMS;;PCI- RI: 2.75 mm 18 mm Cypher DES  . NM MYOVIEW LTD  January 2012   Exercise ~9 min - 10 METS; no ischemia or infarction  . PROSTATECTOMY N/A 08/13/2015   Procedure: TRANSVESICLE OPEN PROSTATECTOMY** ;  Surgeon: Carolan Clines, MD;  Location: [facility];  Service: Urology;  Laterality: N/A;  . SPINE SURGERY     L4-L5   Allergies  Allergen Reactions  . Hydrochlorothiazide     Leg and side pain with this  . Other     Blood pressure med name unknown.   Prior to Admission medications   Medication Sig Start Date End Date Taking? Authorizing Provider  amLODipine (NORVASC) 10 MG tablet TAKE 1 TABLET BY MOUTH EVERY DAY 08/26/19  Yes Leonie Man, MD  aspirin 81 MG tablet Take 81 mg by mouth daily. Reported on 09/05/2015   Yes [provider]  B-D UF III MINI PEN NEEDLES 31G X 5 MM MISC USE FOR INSULIN PEN TWICE A DAY 08/18/19  Yes Elayne Snare, MD  hydrocortisone (CORTEF) 20 MG tablet Take 10-20 mg by mouth 2 (two) times daily. Takes 1 tablet in the morning and 1/2 tablet at night   Yes [provider]  Insulin Lispro Prot & Lispro (HUMALOG 75/25 MIX) (75-25) 100 UNIT/ML Kwikpen INJECT 20 UNITS UNDER THE SKIN BEFORE BREAKFAST AND 8 UNITS BEFORE SUPPER. (REPLACES 70/30) Patient taking differently: Inject 20 units under the skin before breakfast and 6 units before supper. (Replaces 70/30) 12/28/19  Yes Elayne Snare, MD  JANUMET XR [phone] MG TB24 TAKE 1 TABLET BY MOUTH  EVERY DAY 02/16/20  Yes Elayne Snare, MD  levothyroxine (SYNTHROID) 88 MCG tablet Take 1 tablet (88 mcg total) by mouth daily. 11/28/19  Yes Wendie Agreste, MD  lisinopril (ZESTRIL) 10 MG tablet Take 1 tablet (10 mg total) by mouth daily. 09/22/19  Yes Elayne Snare, MD  metoprolol succinate (TOPROL-XL) 25 MG 24 hr tablet TAKE 1 TABLET BY MOUTH EVERY DAY 08/26/19  Yes Leonie Man, MD  [facility] VERIO test strip USE ONETOUCH VERIO TEST STRIPS AS INSTRUCTED TO CHECK BLOOD SUGAR ONCE DAILY.DX:E11.65 09/06/19  Yes Elayne Snare, MD  rosuvastatin (CRESTOR) 40 MG tablet TAKE 1 TABLET BY MOUTH EVERY DAY 01/03/20  Yes Leonie Man, MD  testosterone cypionate (DEPO-TESTOSTERONE) 200 MG/ML injection Inject 1.5 mLs (300 mg total) into the muscle every 21 ( twenty-one) days. 11/23/14 03/14/15  Elayne Snare, MD   Social History  Socioeconomic History  . Marital status: Married    Spouse name: Not on file  . Number of children: Not on file  . Years of education: Not on file  . Highest education level: Not on file  Occupational History  . Not on file  Tobacco Use  . Smoking status: Former Smoker    Quit date: 06/24/1993    Years since quitting: 26.9  . Smokeless tobacco: Never Used  Vaping Use  . Vaping Use: Never used  Substance and Sexual Activity  . Alcohol use: No  . Drug use: No  . 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 Not on file  Social History Narrative   He is a married father of 2, grandfather 84. Exercises only occasionally. Not as much as he desires 2. 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   He does not smoke cigarettes -- quit in 1995. Does not drink alcohol.   Social Determinants of Health   Financial Resource Strain: Not on file  Food Insecurity: Not on file  Transportation Needs: Not on file  Physical Activity: Not on file  Stress: Not on file  Social Connections: Not on file  Intimate Partner  Violence: Not on file    Review of Systems  Constitutional: Negative for fatigue and unexpected weight change.  Eyes: Negative for visual disturbance.  Respiratory: Negative for cough, chest tightness and shortness of breath.   Cardiovascular: Negative for chest pain, palpitations and leg swelling.  Gastrointestinal: Negative for abdominal pain and blood in stool.  Neurological: Negative for dizziness, light-headedness and headaches.     Objective:   Vitals:   05/28/20 0807  BP: (!) 145/77  Pulse: 60  Temp: (!) 97.4 F (36.3 C)  TempSrc: Temporal  SpO2: 98%  Weight: 237 lb (107.5 kg)  Height: 5\' 11"  (1.803 m)     Physical Exam Vitals reviewed.  Constitutional:      Appearance: He is well-developed and well-nourished.  HENT:     Head: Normocephalic and atraumatic.  Eyes:     Extraocular Movements: EOM normal.     Pupils: Pupils are equal, round, and reactive to light.  Neck:     Vascular: No carotid bruit or JVD.  Cardiovascular:     Rate and Rhythm: Normal rate and regular rhythm.     Heart sounds: Normal heart sounds. No murmur heard.   Pulmonary:     Effort: Pulmonary effort is normal.     Breath sounds: Normal breath sounds. No rales.  Musculoskeletal:        General: No edema.  Skin:    General: Skin is warm and dry.  Neurological:     Mental Status: He is alert and oriented to person, place, and time.  Psychiatric:        Mood and Affect: Mood and affect normal.        Assessment & Plan:  Carl Gutierrez is a 74 y.o. male . Essential hypertension  -Borderline elevation in office but has not yet taken meds.  Overall home readings controlled, no med changes.  Recheck 6 months.  Continue follow-up with endocrinology  Type 2 diabetes mellitus with hyperglycemia, with long-term current use of insulin ([place])  -Managed by endocrinology,  postprandials appear to be  primary driver of elevated readings.  Continue follow-up with endocrinology regarding any  med changes.  Hyperlipidemia, unspecified hyperlipidemia type - Plan:  Lipid panel  -Tolerating current regimen, continue same  Hypothyroidism, unspecified type - Plan: TSH  -Tolerating current regimen, check labs.  Continue same.  No orders of the defined types were placed in this encounter.  Patient Instructions    Keep a record of your blood pressures outside of the office and if running over 140/90 - return to discuss changes.   No med changes today. Thanks for coming in today and take care.   If you have lab work done today you will be contacted with your lab results within the next 2 weeks.  If you have not heard from [place] then please contact us. The fastest way to get your results is to register for My Chart.   IF you received an x-ray today, you will receive an invoice from [facility] Radiology. Please contact [facility] Radiology at [phone] with questions or concerns regarding your invoice.   IF you received labwork today, you will receive an invoice from [place]. Please contact [facility] at [phone] with questions or concerns regarding your invoice.   Our billing staff will not be able to assist you with questions regarding bills from these companies.  You will be contacted with the lab results as soon as they are available. The fastest way to get your results is to activate your My Chart account. Instructions are located on the last page of this paperwork. If you have not heard from [place] regarding the results in 2 weeks, please contact this office.         Signed, [name], MD [facility]

## 2020-05-28 NOTE — Patient Instructions (Addendum)
  Keep a record of your blood pressures outside of the office and if running over 140/90 - return to discuss changes.   No med changes today. Thanks for coming in today and take care.   If you have lab work done today you will be contacted with your lab results within the next 2 weeks.  If you have not heard from [place] then please contact us. The fastest way to get your results is to register for My Chart.   IF you received an x-ray today, you will receive an invoice from [facility]. Please contact [facility] Radiology at [phone] with questions or concerns regarding your invoice.   IF you received labwork today, you will receive an invoice from [place]. Please contact [facility] at [phone] with questions or concerns regarding your invoice.   Our billing staff will not be able to assist you with questions regarding bills from these companies.  You will be contacted with the lab results as soon as they are available. The fastest way to get your results is to activate your My Chart account. Instructions are located on the last page of this paperwork. If you have not heard from [place] regarding the results in 2 weeks, please contact this office.

## 2020-05-29 LAB — LIPID PANEL
Chol/HDL Ratio: 3.3 ratio (ref 0.0–5.0)
Cholesterol, Total: 130 mg/dL (ref 100–199)
HDL: 40 mg/dL (ref >39)
LDL Chol Calc (NIH): 66 mg/dL (ref 0–99)
Triglycerides: 137 mg/dL (ref 0–149)
VLDL Cholesterol Cal: 24 mg/dL (ref 5–40)

## 2020-05-29 LAB — TSH: TSH: 4.94 u[IU]/mL — ABNORMAL HIGH (ref 0.450–4.500)

## 2020-06-10 ENCOUNTER — Other Ambulatory Visit: Payer: Self-pay | Admitting: Cardiology

## 2020-06-10 ENCOUNTER — Other Ambulatory Visit: Payer: Self-pay | Admitting: Endocrinology

## 2020-06-11 ENCOUNTER — Encounter: Payer: Self-pay | Admitting: Radiology

## 2020-06-19 ENCOUNTER — Other Ambulatory Visit: Payer: Self-pay | Admitting: *Deleted

## 2020-07-19 ENCOUNTER — Ambulatory Visit (INDEPENDENT_AMBULATORY_CARE_PROVIDER_SITE_OTHER): Payer: Medicare Other | Admitting: Family Medicine

## 2020-07-19 ENCOUNTER — Other Ambulatory Visit: Payer: Self-pay

## 2020-07-19 ENCOUNTER — Encounter: Payer: Self-pay | Admitting: Family Medicine

## 2020-07-19 VITALS — BP 126/74 | HR 64 | Temp 97.2°F | Ht 71.0 in | Wt 236.0 lb

## 2020-07-19 DIAGNOSIS — E039 Hypothyroidism, unspecified: Secondary | ICD-10-CM | POA: Diagnosis not present

## 2020-07-19 NOTE — Progress Notes (Signed)
Subjective:  Patient ID: Carl Gutierrez, male    DOB: [birth year]  Age: 75 y.o. MRN: [id number]  CC:  Chief Complaint  Patient presents with  . Follow-up    On Hypothyroidism due to elevated TSH. Pt reports no symptoms of these conditions. Pt isn't currently fasting. PT was informed this is the main focus of today's visit.    HPI [facility] presents for   Hypothyroidism: Lab Results  Component Value Date   TSH 4.940 (H) 05/28/2020  taking medication daily. Synthroid 81mcg  No missed doses.  Continued same dose, with borderline tsh in December.  slight warm natured at night at times. No new hair or skin changes, heart palpitations or new fatigue. No new weight changes.  Wt Readings from Last 3 Encounters:  07/19/20 236 lb (107 kg)  05/28/20 237 lb (107.5 kg)  05/04/20 234 lb 6.4 oz (106.3 kg)    History Patient Active Problem List   Diagnosis Date Noted  . Stage 3a chronic kidney disease ([place]) 01/04/2020  . Anemia in chronic kidney disease 11/30/2015  . Recurrent deep vein thrombosis (DVT) ([place]) 10/01/2015  . Hematuria, unspecified 10/01/2015  . Elevated serum creatinine 10/01/2015  . BPH (benign prostatic hyperplasia) 08/13/2015  . Benign localized hyperplasia of prostate with urinary obstruction 07/23/2015  . Preop cardiovascular exam 07/17/2015  . Encounter for CDL (commercial driving license) exam 33/82/5053  . Obesity (BMI 30-39.9) 06/26/2013  . Essential hypertension 06/26/2013  . Panhypopituitarism ([place]) 11/17/2011  .   11/17/2011  . Diabetes mellitus ([place]) 11/17/2011  . Hyperlipidemia LDL goal <70 11/17/2011  . CAD S/P percutaneous coronary angioplasty 07/19/2002   Past Medical History:  Diagnosis Date  . CAD S/P percutaneous coronary angioplasty February 2004; 2008   PCI-dLAD: 2.25 mm x 16 mm Express BMS; Ramus- 2.75 mm x 18 mm Cypher DES (2.8 mm); follow-up 11/'08: Patent LAD/ramus stents. 90% small OM. EF 50-60%.; Myoview 1/'12: Exercise 9 min, 10 METS with  no ischemia/infarction.   . Clotting disorder ([place])   . Diabetes mellitus    Low-dose metformin  . DJD (degenerative joint disease), lumbosacral     status post L4-L5 surgery  . Essential hypertension   . History of DVT of lower extremity     postoperative  . Hyperlipidemia with target LDL less than 70   . Hypothyroidism    Synthroid  . Panhypopituitarism ([place])    Status post pituitary adenoma removal in 2012  . Recurrent deep vein thrombosis (DVT) ([place]) 10/01/2015   Past Surgical History:  Procedure Laterality Date  . BRAIN SURGERY  2012   Removal of pituitary adenoma  . CARDIAC CATHETERIZATION  November 2008   Significant LAD tapering but no significant disease the distal stent. Patent Ramus stent. 90% lesion in small OM 2; small nondominant RCA. Medical therapy. EF 50-60%.  . CORONARY ANGIOPLASTY WITH STENT PLACEMENT  February 2004   PCI-dLAD: 2.25 mm x 16 mm BMS;;PCI- RI: 2.75 mm 18 mm Cypher DES  . NM MYOVIEW LTD  January 2012   Exercise ~9 min - 10 METS; no ischemia or infarction  . PROSTATECTOMY N/A 08/13/2015   Procedure: TRANSVESICLE OPEN PROSTATECTOMY** ;  Surgeon: Carolan Clines, MD;  Location: [facility];  Service: Urology;  Laterality: N/A;  . SPINE SURGERY     L4-L5   Allergies  Allergen Reactions  . Hydrochlorothiazide     Leg and side pain with this  . Other     Blood pressure med name unknown.   Prior  to Admission medications   Medication Sig Start Date End Date Taking? Authorizing Provider  amLODipine (NORVASC) 10 MG tablet TAKE 1 TABLET BY MOUTH EVERY DAY 06/11/20  Yes Leonie Man, MD  aspirin 81 MG tablet Take 81 mg by mouth daily. Reported on 09/05/2015   Yes [provider]  B-D UF III MINI PEN NEEDLES 31G X 5 MM MISC USE FOR INSULIN PEN TWICE A DAY 08/18/19  Yes Elayne Snare, MD  hydrocortisone (CORTEF) 20 MG tablet Take 10-20 mg by mouth 2 (two) times daily. Takes 1 tablet in the morning and 1/2 tablet at night   Yes [provider]   Insulin Lispro Prot & Lispro (HUMALOG 75/25 MIX) (75-25) 100 UNIT/ML Kwikpen Inject 20 units under the skin before breakfast and 6 units before supper. (Replaces 70/30) 06/10/20  Yes Elayne Snare, MD  JANUMET XR [phone] MG TB24 TAKE 1 TABLET BY MOUTH EVERY DAY 06/10/20  Yes Elayne Snare, MD  levothyroxine (SYNTHROID) 88 MCG tablet Take 1 tablet (88 mcg total) by mouth daily. 11/28/19  Yes Wendie Agreste, MD  lisinopril (ZESTRIL) 10 MG tablet TAKE 1 TABLET BY MOUTH EVERY DAY 06/10/20  Yes Elayne Snare, MD  metoprolol succinate (TOPROL-XL) 25 MG 24 hr tablet TAKE 1 TABLET BY MOUTH EVERY DAY 06/11/20  Yes Leonie Man, MD  [facility] VERIO test strip USE ONETOUCH VERIO TEST STRIPS AS INSTRUCTED TO CHECK BLOOD SUGAR ONCE DAILY.DX:E11.65 09/06/19  Yes Elayne Snare, MD  rosuvastatin (CRESTOR) 40 MG tablet Take 1 tablet (40 mg total) by mouth daily. 05/28/20  Yes Wendie Agreste, MD  testosterone cypionate (DEPO-TESTOSTERONE) 200 MG/ML injection Inject 1.5 mLs (300 mg total) into the muscle every 21 ( twenty-one) days. 11/23/14 03/14/15  Elayne Snare, MD   Social History   Socioeconomic History  . Marital status: Married    Spouse name: Not on file  . Number of children: Not on file  . Years of education: Not on file  . Highest education level: Not on file  Occupational History  . Not on file  Tobacco Use  . Smoking status: Former Smoker    Quit date: 06/24/1993    Years since quitting: 27.0  . Smokeless tobacco: Never Used  Vaping Use  . Vaping Use: Never used  Substance and Sexual Activity  . Alcohol use: No  . Drug use: No  . 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 Not on file  Social History Narrative   He is a married father of 2, grandfather 74. Exercises only occasionally. Not as much as he desires 2. 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   He does not smoke cigarettes -- quit in 1995. Does not drink  alcohol.   Social Determinants of Health   Financial Resource Strain: Not on file  Food Insecurity: Not on file  Transportation Needs: Not on file  Physical Activity: Not on file  Stress: Not on file  Social Connections: Not on file  Intimate Partner Violence: Not on file    Review of Systems   Objective:   Vitals:   07/19/20 1031  BP: 126/74  Pulse: 64  Temp: (!) 97.2 F (36.2 C)  TempSrc: Temporal  SpO2: 98%  Weight: 236 lb (107 kg)  Height: 5\' 11"  (1.803 m)     Physical Exam Vitals reviewed.  Constitutional:      Appearance: He  is well-developed and well-nourished.  HENT:     Head: Normocephalic and atraumatic.  Eyes:     Extraocular Movements: EOM normal.     Pupils: Pupils are equal, round, and reactive to light.  Neck:     Vascular: No carotid bruit or JVD.  Cardiovascular:     Rate and Rhythm: Normal rate and regular rhythm.     Heart sounds: Normal heart sounds. No murmur heard.   Pulmonary:     Effort: Pulmonary effort is normal.     Breath sounds: Normal breath sounds. No rales.  Musculoskeletal:        General: No edema.  Skin:    General: Skin is warm and dry.  Neurological:     Mental Status: He is alert and oriented to person, place, and time.  Psychiatric:        Mood and Affect: Mood and affect normal.        Assessment & Plan:  Carl Gutierrez is a 75 y.o. male . Hypothyroidism, unspecified type - Plan: TSH + free T4  -Borderline TSH previously.  Check TSH, free T4.  No orders of the defined types were placed in this encounter.  Patient Instructions     No medication changes for now but depending on thyroid test results today may need to adjust slightly.  I will let you know.  Let me know if there are questions and take care.   If you have lab work done today you will be contacted with your lab results within the next 2 weeks.  If you have not heard from [place] then please contact us. The fastest way to get your results is to  register for My Chart.   IF you received an x-ray today, you will receive an invoice from [facility]iology. Please contact [name] Radiology at [phone] with questions or concerns regarding your invoice.   IF you received labwork today, you will receive an invoice from [place]. Please contact [facility] at [phone] with questions or concerns regarding your invoice.   Our billing staff will not be able to assist you with questions regarding bills from these companies.  You will be contacted with the lab results as soon as they are available. The fastest way to get your results is to activate your My Chart account. Instructions are located on the last page of this paperwork. If you have not heard from [place] regarding the results in 2 weeks, please contact this office.         Signed, [name], MD [facility]

## 2020-07-19 NOTE — Patient Instructions (Addendum)
   No medication changes for now but depending on thyroid test results today may need to adjust slightly.  I will let you know.  Let me know if there are questions and take care.   If you have lab work done today you will be contacted with your lab results within the next 2 weeks.  If you have not heard from [place] then please contact us. The fastest way to get your results is to register for My Chart.   IF you received an x-ray today, you will receive an invoice from [facility] Radiology. Please contact [facility] Radiology at [phone] with questions or concerns regarding your invoice.   IF you received labwork today, you will receive an invoice from [place]. Please contact [facility] at [phone] with questions or concerns regarding your invoice.   Our billing staff will not be able to assist you with questions regarding bills from these companies.  You will be contacted with the lab results as soon as they are available. The fastest way to get your results is to activate your My Chart account. Instructions are located on the last page of this paperwork. If you have not heard from [place] regarding the results in 2 weeks, please contact this office.

## 2020-07-20 LAB — TSH+FREE T4
Free T4: 1.38 ng/dL (ref 0.82–1.77)
TSH: 2.54 u[IU]/mL (ref 0.450–4.500)

## 2020-07-24 LAB — HM DIABETES EYE EXAM

## 2020-07-25 NOTE — Progress Notes (Signed)
Lab Letter SENT  

## 2020-08-02 ENCOUNTER — Other Ambulatory Visit: Payer: Self-pay

## 2020-08-02 ENCOUNTER — Encounter: Payer: Self-pay | Admitting: Cardiology

## 2020-08-02 ENCOUNTER — Ambulatory Visit (INDEPENDENT_AMBULATORY_CARE_PROVIDER_SITE_OTHER): Payer: Medicare Other | Admitting: Cardiology

## 2020-08-02 VITALS — BP 138/70 | HR 61 | Ht 71.0 in | Wt 238.0 lb

## 2020-08-02 DIAGNOSIS — I10 Essential (primary) hypertension: Secondary | ICD-10-CM

## 2020-08-02 DIAGNOSIS — Z9861 Coronary angioplasty status: Secondary | ICD-10-CM

## 2020-08-02 DIAGNOSIS — E1169 Type 2 diabetes mellitus with other specified complication: Secondary | ICD-10-CM

## 2020-08-02 DIAGNOSIS — E785 Hyperlipidemia, unspecified: Secondary | ICD-10-CM

## 2020-08-02 DIAGNOSIS — E669 Obesity, unspecified: Secondary | ICD-10-CM | POA: Diagnosis not present

## 2020-08-02 DIAGNOSIS — I2510 Atherosclerotic heart disease of native coronary artery without angina pectoris: Secondary | ICD-10-CM

## 2020-08-02 NOTE — Progress Notes (Signed)
Primary Care Provider: Wendie Agreste, MD Cardiologist: Glenetta Hew, MD Electrophysiologist: None  Clinic Note: Chief Complaint  Patient presents with  . Follow-up    Annual.  Now complaints  . Coronary Artery Disease    No angina    ===================================  ASSESSMENT/PLAN   Problem List Items Addressed This Visit    CAD S/P percutaneous coronary angioplasty - Primary (Chronic)    History of two-vessel PCI dating back to 2004 and 2008.  This is before I met him,, and before he moved to [place].  He really has not any anginal symptoms since I met him. We did evaluate by Myoview back in 2012 that was nonischemic.  He exercised 9 minutes reaching 10 METS. At this point, he is not able to exercise much because knee.  Still try to stay somewhat active.    Plan:  Continue Beta-blocker and amlodipine for antianginal benefit as well as blood pressure.->  Cannot titrate beta-blocker further.  Amlodipine at max dose.  Also on ACE inhibitor since he has diabetes.  This also provides afterload reduction and renal protection.  On aspirin and high-dose rosuvastatin.  At this point, with no active symptoms, would not further evaluate unless he does have recurrence of symptoms.      Relevant Orders   EKG 12-Lead (Completed)   Hyperlipidemia associated with type 2 diabetes mellitus ([place]) (Chronic)    Most recent lipids as of December have been well controlled LDL and total cholesterol.  HDL is also stable.  Continue current dose of atorvastatin. Goal LDL is less than 70, currently 66.  A1c was  7.4.  Is on insulin.  Would consider GLP-1 agonist or SGLT2 inhibitor for cardioprotection.       Obesity (BMI 30-39.9) (Chronic)    Stable weight.  We talked about the fact that he probably needs to adjust his diet since exercising is difficult.  I did try to suggest water walking or water aerobics.  He is just been [place] of going to public facilities because of Covid  restrictions.      Essential hypertension (Chronic)    Borderline pressure today.  He does have room to titrate lisinopril to 20 mg if pressures increase.  For now probably not able to titrate beta-blocker any further.  Amlodipine is at maximum.  Has not had diuretic requirement which would be another option.      Relevant Orders   EKG 12-Lead (Completed)     ===================================  HPI:    Xylan Sheils is a 75 y.o. male with a PMH notable for CAD-PCI and CRFs of HTN and HLD along with Panhypopituitarism presents today for annual follow-up.  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  Denali Becvar was last seen on August 03, 2019 -> he was company by his wife.  Doing very well.  Really his left knee pain bother him more than anything else.  Tries to get some exercise walking with his wife, but the knee was bothering him.  No active cardiac symptoms. => No changes to medications.    Recent Hospitalizations: None  Reviewed  CV studies:    The following studies were reviewed today: (if available, images/films reviewed: From Epic Chart or Care Everywhere) . None:   Interval History:   Adama Ferber returns today overall doing pretty well.  He is still try to stay active, but still limited by his knee pain.  He does try to walk some though.  Just not as fast  as he was.  He says his blood pressures at home are better than they are here today.  Usually in the 419F systolic.  He really has not had any anginal symptoms or heart failure symptoms.  No arrhythmia symptoms.  Doing quite well.  CV Review of Symptoms (Summary): no chest pain or dyspnea on exertion negative for - edema, irregular heartbeat, orthopnea, palpitations, paroxysmal nocturnal dyspnea, rapid heart rate, shortness of breath or Lightheadedness, dizziness, syncope/near syncope or TIA/amaurosis fugax, claudication  The patient does not have symptoms concerning for COVID-19 infection (fever, chills,  cough, or new shortness of breath).   REVIEWED OF SYSTEMS   Review of Systems  Constitutional: Negative for malaise/fatigue and weight loss.  HENT: Negative for congestion and nosebleeds.   Respiratory: Negative for cough, shortness of breath and wheezing.   Cardiovascular:       Negative for HPI  Gastrointestinal: Negative for blood in stool and melena.  Genitourinary: Negative for hematuria.  Musculoskeletal: Positive for joint pain (Mostly left knee.  Somewhat painful and swollen.).  Neurological: Negative for dizziness, weakness (The right leg seems weak, but not really) and headaches.  Psychiatric/Behavioral: Negative for memory loss. The patient is not nervous/anxious and does not have insomnia.    I have reviewed and (if needed) personally updated the patient's problem list, medications, allergies, past medical and surgical history, social and family history.   PAST MEDICAL HISTORY   Past Medical History:  Diagnosis Date  . CAD S/P percutaneous coronary angioplasty February 2004; 2008   PCI-dLAD: 2.25 mm x 16 mm Express BMS; Ramus- 2.75 mm x 18 mm Cypher DES (2.8 mm); follow-up 11/'08: Patent LAD/ramus stents. 90% small OM. EF 50-60%.; Myoview 1/'12: Exercise 9 min, 10 METS with no ischemia/infarction.   . Clotting disorder ([place])   . Diabetes mellitus    Low-dose metformin  . DJD (degenerative joint disease), lumbosacral     status post L4-L5 surgery  . Essential hypertension   . History of DVT of lower extremity     postoperative  . Hyperlipidemia with target LDL less than 70   . Hypothyroidism    Synthroid  . Panhypopituitarism ([place])    Status post pituitary adenoma removal in 2012  . Recurrent deep vein thrombosis (DVT) ([place]) 10/01/2015    PAST SURGICAL HISTORY   Past Surgical History:  Procedure Laterality Date  . BRAIN SURGERY  2012   Removal of pituitary adenoma  . CARDIAC CATHETERIZATION  November 2008   Significant LAD tapering but no significant disease  the distal stent. Patent Ramus stent. 90% lesion in small OM 2; small nondominant RCA. Medical therapy. EF 50-60%.  . CORONARY ANGIOPLASTY WITH STENT PLACEMENT  February 2004   PCI-dLAD: 2.25 mm x 16 mm BMS;;PCI- RI: 2.75 mm 18 mm Cypher DES  . NM MYOVIEW LTD  January 2012   Exercise ~9 min - 10 METS; no ischemia or infarction  . PROSTATECTOMY N/A 08/13/2015   Procedure: TRANSVESICLE OPEN PROSTATECTOMY** ;  Surgeon: Carolan Clines, MD;  Location: [facility];  Service: Urology;  Laterality: N/A;  . SPINE SURGERY     L4-L5   Immunization History  Administered Date(s) Administered  . Fluad Quad(high Dose 65+) 02/20/2019  . Influenza Split 03/05/2020  . Influenza, High Dose Seasonal PF 04/03/2015, 04/02/2017  . Influenza-Unspecified 03/16/2013, 04/05/2014, 04/03/2015, 04/22/2016  . Moderna Sars-Covid-2 Vaccination 07/19/2019, 08/18/2019, 04/12/2020  . Pneumococcal Conjugate-13 01/22/2015  . Pneumococcal Polysaccharide-23 06/16/2009  . Tdap 06/16/2005    MEDICATIONS/ALLERGIES   Current  Meds  Medication Sig  . amLODipine (NORVASC) 10 MG tablet TAKE 1 TABLET BY MOUTH EVERY DAY  . aspirin 81 MG tablet Take 81 mg by mouth daily. Reported on 09/05/2015  . B-D UF III MINI PEN NEEDLES 31G X 5 MM MISC USE FOR INSULIN PEN TWICE A DAY  . hydrocortisone (CORTEF) 20 MG tablet Take 10-20 mg by mouth 2 (two) times daily. Takes 1 tablet in the morning and 1/2 tablet at night  . Insulin Lispro Prot & Lispro (HUMALOG 75/25 MIX) (75-25) 100 UNIT/ML Kwikpen Inject 20 units under the skin before breakfast and 6 units before supper. (Replaces 70/30) (Patient taking differently: Inject 20 units under the skin before breakfast and 8 units before supper. (Replaces 70/30))  . JANUMET XR [id number] MG TB24 TAKE 1 TABLET BY MOUTH EVERY DAY  . levothyroxine (SYNTHROID) 88 MCG tablet Take 1 tablet (88 mcg total) by mouth daily.  Marland Kitchen lisinopril (ZESTRIL) 10 MG tablet TAKE 1 TABLET BY MOUTH EVERY DAY  . metoprolol succinate  (TOPROL-XL) 25 MG 24 hr tablet TAKE 1 TABLET BY MOUTH EVERY DAY  . ONETOUCH VERIO test strip USE ONETOUCH VERIO TEST STRIPS AS INSTRUCTED TO CHECK BLOOD SUGAR ONCE DAILY.DX:E11.65  . rosuvastatin (CRESTOR) 40 MG tablet Take 1 tablet (40 mg total) by mouth daily.    Allergies  Allergen Reactions  . Hydrochlorothiazide     Leg and side pain with this  . Other     Blood pressure med name unknown.    SOCIAL HISTORY/FAMILY HISTORY   Reviewed in Epic:  Pertinent findings:  Social History   Tobacco Use  . Smoking status: Former Smoker    Quit date: 06/24/1993    Years since quitting: 27.2  . Smokeless tobacco: Never Used  Vaping Use  . Vaping Use: Never used  Substance Use Topics  . Alcohol use: No  . Drug use: No   Social History   Social History Narrative   He is a married father of 2, grandfather 33. Exercises only occasionally. Not as much as he desires 2. 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   He does not smoke cigarettes -- quit in 1995. Does not drink alcohol.    OBJCTIVE -PE, EKG, labs   Wt Readings from Last 3 Encounters:  08/28/20 232 lb (105.2 kg)  08/02/20 238 lb (108 kg)  07/19/20 236 lb (107 kg)    Physical Exam: BP 138/70 (BP Location: Left Arm, Patient Position: Sitting)   Pulse 61   Ht 5' 11"  (1.803 m)   Wt 238 lb (108 kg)   SpO2 96%   BMI 33.19 kg/m  Physical Exam Vitals reviewed.  Constitutional:      General: He is not in acute distress.    Appearance: Normal appearance. He is obese. He is not ill-appearing or toxic-appearing.     Comments: Well-nourished, well-groomed.  Healthy-appearing.  HENT:     Head: Normocephalic and atraumatic.  Neck:     Vascular: No carotid bruit, hepatojugular reflux or JVD.  Cardiovascular:     Rate and Rhythm: Normal rate and regular rhythm.  No extrasystoles are present.    Chest Wall: PMI is not displaced.     Pulses: Normal pulses and intact distal pulses.     Heart sounds: S1 normal and S2 normal. No  friction rub. Gallop present. S4 sounds present.   Pulmonary:     Effort: Pulmonary effort is normal. No respiratory distress.     Breath sounds: Normal breath  sounds.  Chest:     Chest wall: No tenderness.  Musculoskeletal:        General: Swelling (Swelling in the left knee but otherwise none) present. Normal range of motion.     Cervical back: Normal range of motion and neck supple.  Lymphadenopathy:     Cervical: No cervical adenopathy.  Skin:    General: Skin is warm and dry.     Coloration: Skin is not pale.  Neurological:     General: No focal deficit present.     Mental Status: He is alert and oriented to person, place, and time. Mental status is at baseline.     Motor: No weakness.     Gait: Gait abnormal (Antalgic gait favoring knee).  Psychiatric:        Mood and Affect: Mood normal.        Behavior: Behavior normal.        Thought Content: Thought content normal.        Judgment: Judgment normal.      Adult ECG Report  Rate: 61 ;  Rhythm: normal sinus rhythm and 1  AV block;   Narrative Interpretation: Stable  Recent Labs: Reviewed Lab Results  Component Value Date   CHOL 130 05/28/2020   HDL 40 05/28/2020   LDLCALC 66 05/28/2020   LDLDIRECT 85.0 05/18/2014   TRIG 137 05/28/2020   CHOLHDL 3.3 05/28/2020   Lab Results  Component Value Date   CREATININE 1.61 (H) 08/23/2020   BUN 23 08/23/2020   NA 139 08/23/2020   K 4.4 08/23/2020   CL 106 08/23/2020   CO2 27 08/23/2020   CBC Latest Ref Rng & Units 10/26/2018 08/06/2017 11/30/2015  WBC 4.0 - 10.5 K/uL 7.8 15.8(H) 7.0  Hemoglobin 13.0 - 17.0 g/dL 13.6 13.0 11.4(L)  Hematocrit 39.0 - 52.0 % 40.3 39.6 35.8(L)  Platelets 150.0 - 400.0 K/uL 298.0 361 331     Lab Results  Component Value Date   TSH 2.540 07/19/2020    ==================================================  COVID-19 Education: The signs and symptoms of COVID-19 were discussed with the patient and how to seek care for testing (follow up  with PCP or arrange E-visit).   The importance of social distancing and COVID-19 vaccination was discussed today. The patient is practicing social distancing & Masking.   I spent a total of 34mnutes with the patient spent in direct patient consultation.  Additional time spent with chart review  / charting (studies, outside notes, etc): 10 min Total Time: 42 min   Current medicines are reviewed at length with the patient today.  (+/- concerns) n/a  This visit occurred during the SARS-CoV-2 public health emergency.  Safety protocols were in place, including screening questions prior to the visit, additional usage of staff PPE, and extensive cleaning of exam room while observing appropriate contact time as indicated for disinfecting solutions.  Notice: This dictation was prepared with Dragon dictation along with smaller phrase technology. Any transcriptional errors that result from this process are unintentional and may not be corrected upon review.  Patient Instructions / Medication Changes & Studies & Tests Ordered   Patient Instructions  Medication Instructions:  NO CHANGES  *If you need a refill on your cardiac medications before your next appointment, please call your pharmacy*   Lab Work: NOT NEEDED If you have labs (blood work) drawn today and your tests are completely normal, you will receive your results only by: .Marland KitchenMyChart Message (if you have MyChart) OR . A paper  copy in the mail If you have any lab test that is abnormal or we need to change your treatment, we will call you to review the results.   Testing/Procedures: NOT NEEDED   Follow-Up: At [facility], you and your health needs are our priority.  As part of our continuing mission to provide you with exceptional heart care, we have created designated Provider Care Teams.  These Care Teams include your primary Cardiologist (physician) and Advanced Practice Providers (APPs -  Physician Assistants and Nurse  Practitioners) who all work together to provide you with the care you need, when you need it.  We recommend signing up for the patient portal called "MyChart".  Sign up information is provided on this After Visit Summary.  MyChart is used to connect with patients for Virtual Visits (Telemedicine).  Patients are able to view lab/test results, encounter notes, upcoming appointments, etc.  Non-urgent messages can be sent to your provider as well.   To learn more about what you can do with MyChart, go to [email or web address].    Your next appointment:   12 month(s)  The format for your next appointment:   In Person  Provider:   Glenetta Hew, MD     Studies Ordered:   Orders Placed This Encounter  Procedures  . EKG 12-Lead     Glenetta Hew, M.D., M.S. Interventional Cardiologist   Pager # [phone] Phone # [phone] [address]. [place], [place] [address]   Thank you for choosing Heartcare at [facility]!!

## 2020-08-02 NOTE — Patient Instructions (Addendum)
Medication Instructions:  NO CHANGES  *If you need a refill on your cardiac medications before your next appointment, please call your pharmacy*   Lab Work: NOT NEEDED If you have labs (blood work) drawn today and your tests are completely normal, you will receive your results only by: Marland Kitchen MyChart Message (if you have MyChart) OR . A paper copy in the mail If you have any lab test that is abnormal or we need to change your treatment, we will call you to review the results.   Testing/Procedures: NOT NEEDED   Follow-Up: At [facility], you and your health needs are our priority.  As part of our continuing mission to provide you with exceptional heart care, we have created designated Provider Care Teams.  These Care Teams include your primary Cardiologist (physician) and Advanced Practice Providers (APPs -  Physician Assistants and Nurse Practitioners) who all work together to provide you with the care you need, when you need it.  We recommend signing up for the patient portal called "MyChart".  Sign up information is provided on this After Visit Summary.  MyChart is used to connect with patients for Virtual Visits (Telemedicine).  Patients are able to view lab/test results, encounter notes, upcoming appointments, etc.  Non-urgent messages can be sent to your provider as well.   To learn more about what you can do with MyChart, go to [email or web address].    Your next appointment:   12 month(s)  The format for your next appointment:   In Person  Provider:   Glenetta Hew, MD

## 2020-08-23 ENCOUNTER — Other Ambulatory Visit (INDEPENDENT_AMBULATORY_CARE_PROVIDER_SITE_OTHER): Payer: Medicare Other

## 2020-08-23 ENCOUNTER — Other Ambulatory Visit: Payer: Self-pay

## 2020-08-23 DIAGNOSIS — Z794 Long term (current) use of insulin: Secondary | ICD-10-CM | POA: Diagnosis not present

## 2020-08-23 DIAGNOSIS — E1165 Type 2 diabetes mellitus with hyperglycemia: Secondary | ICD-10-CM | POA: Diagnosis not present

## 2020-08-23 LAB — BASIC METABOLIC PANEL
BUN: 23 mg/dL (ref 6–23)
CO2: 27 mEq/L (ref 19–32)
Calcium: 9.1 mg/dL (ref 8.4–10.5)
Chloride: 106 mEq/L (ref 96–112)
Creatinine, Ser: 1.61 mg/dL — ABNORMAL HIGH (ref 0.40–1.50)
GFR: 41.22 mL/min — ABNORMAL LOW (ref >60.00)
Glucose, Bld: 137 mg/dL — ABNORMAL HIGH (ref 70–99)
Potassium: 4.4 mEq/L (ref 3.5–5.1)
Sodium: 139 mEq/L (ref 135–145)

## 2020-08-23 LAB — HEMOGLOBIN A1C: Hgb A1c MFr Bld: 8 % — ABNORMAL HIGH (ref 4.6–6.5)

## 2020-08-28 ENCOUNTER — Other Ambulatory Visit: Payer: Self-pay

## 2020-08-28 ENCOUNTER — Encounter: Payer: Self-pay | Admitting: Endocrinology

## 2020-08-28 ENCOUNTER — Ambulatory Visit (INDEPENDENT_AMBULATORY_CARE_PROVIDER_SITE_OTHER): Payer: Medicare Other | Admitting: Endocrinology

## 2020-08-28 VITALS — BP 120/78 | HR 72 | Resp 20 | Ht 71.0 in | Wt 232.0 lb

## 2020-08-28 DIAGNOSIS — I10 Essential (primary) hypertension: Secondary | ICD-10-CM

## 2020-08-28 DIAGNOSIS — E1165 Type 2 diabetes mellitus with hyperglycemia: Secondary | ICD-10-CM

## 2020-08-28 DIAGNOSIS — E230 Hypopituitarism: Secondary | ICD-10-CM | POA: Diagnosis not present

## 2020-08-28 DIAGNOSIS — Z794 Long term (current) use of insulin: Secondary | ICD-10-CM | POA: Diagnosis not present

## 2020-08-28 DIAGNOSIS — N289 Disorder of kidney and ureter, unspecified: Secondary | ICD-10-CM | POA: Diagnosis not present

## 2020-08-28 MED ORDER — INDAPAMIDE 1.25 MG PO TABS: 1.2500 mg | ORAL_TABLET | Freq: Every day | ORAL | 2 refills | Status: DC

## 2020-08-28 NOTE — Patient Instructions (Addendum)
Less fast food  Check Bp at home  Change LISINOPRIL TO indapamide 1.25mg 

## 2020-08-28 NOTE — Progress Notes (Signed)
- Patient ID: Elazar Argabright, male   DOB: [birth year], 75 y.o.   MRN: [id number]           Reason for Appointment: Endocrinology follow-up  History of Present Illness   DIABETES:  Recent history:     INSULIN regimen: Humalog 75/25 insulin, 20 units before breakfast and 6 before dinner  Non-insulin hypoglycemic drugs: Janumet XR 100/1000 daily  His A1c is 8 and gradually increasing   Current management, blood sugar patterns and problems identified:  He again has significantly higher readings after meals especially after lunch  Has several readings over 200 which he thinks was from eating fast food  He has only a few readings after dinner which are mostly high also  Fasting blood sugars are usually fairly good including in the lab  His weight is fluctuating somewhat and no consistent weight loss  He thinks he is trying to do a little walking but not consistently  Usually remembering to take his insulin twice a day  Side effects from medications: None      Monitors blood glucose:  Once or twice a day.    Glucometer:  One Touch           Blood Glucose readings from download   PRE-MEAL Fasting Lunch Dinner Bedtime Overall  Glucose range:  97-155  160     Mean/median:  37    176   POST-MEAL PC Breakfast PC Lunch PC Dinner  Glucose range:   251-290  92-228  Mean/median:   274    Previously:  PRE-MEAL Fasting Lunch Dinner Bedtime Overall  Glucose range:  80-116  114-200     Mean/median:  104  158    144   POST-MEAL PC Breakfast PC Lunch PC Dinner  Glucose range:   160-207  131-188  Mean/median:   182  152     Dietician visit: Most recent: 11/2012     Weight history   Wt Readings from Last 3 Encounters:  08/28/20 232 lb (105.2 kg)  08/02/20 238 lb (108 kg)  07/19/20 236 lb (107 kg)           Diabetes labs:  Lab Results  Component Value Date   HGBA1C 8.0 (H) 08/23/2020   HGBA1C 7.4 (H) 05/01/2020   HGBA1C 7.0 (H) 12/28/2019   Lab Results  Component  Value Date   MICROALBUR 4.0 (H) 05/01/2020   LDLCALC 66 05/28/2020   CREATININE 1.61 (H) 08/23/2020   Other problems discussed today: See review of systems  Allergies as of 08/28/2020      Reactions   Hydrochlorothiazide    Leg and side pain with this   Other    Blood pressure med name unknown.      Medication List       Accurate as of August 28, 2020  8:46 AM. If you have any questions, ask your nurse or doctor.        amLODipine 10 MG tablet Commonly known as: NORVASC TAKE 1 TABLET BY MOUTH EVERY DAY   aspirin 81 MG tablet Take 81 mg by mouth daily. Reported on 09/05/2015   B-D UF III MINI PEN NEEDLES 31G X 5 MM Misc Generic drug: Insulin Pen Needle USE FOR INSULIN PEN TWICE A DAY   hydrocortisone 20 MG tablet Commonly known as: CORTEF Take 10-20 mg by mouth 2 (two) times daily. Takes 1 tablet in the morning and 1/2 tablet at night   Insulin Lispro Prot & Lispro (75-25) 100 UNIT/ML [employer]  Commonly known as: HUMALOG 75/25 MIX Inject 20 units under the skin before breakfast and 6 units before supper. (Replaces 70/30) What changed: additional instructions   Janumet XR [phone] MG Tb24 Generic drug: SitaGLIPtin-MetFORMIN HCl TAKE 1 TABLET BY MOUTH EVERY DAY   levothyroxine 88 MCG tablet Commonly known as: SYNTHROID Take 1 tablet (88 mcg total) by mouth daily.   lisinopril 10 MG tablet Commonly known as: ZESTRIL TAKE 1 TABLET BY MOUTH EVERY DAY   metoprolol succinate 25 MG 24 hr tablet Commonly known as: TOPROL-XL TAKE 1 TABLET BY MOUTH EVERY DAY   OneTouch Verio test strip Generic drug: glucose blood USE ONETOUCH VERIO TEST STRIPS AS INSTRUCTED TO CHECK BLOOD SUGAR ONCE DAILY.DX:E11.65   rosuvastatin 40 MG tablet Commonly known as: CRESTOR Take 1 tablet (40 mg total) by mouth daily.       Allergies:  Allergies  Allergen Reactions  . Hydrochlorothiazide     Leg and side pain with this  . Other     Blood pressure med name unknown.    Past  Medical History:  Diagnosis Date  . CAD S/P percutaneous coronary angioplasty February 2004; 2008   PCI-dLAD: 2.25 mm x 16 mm Express BMS; Ramus- 2.75 mm x 18 mm Cypher DES (2.8 mm); follow-up 11/'08: Patent LAD/ramus stents. 90% small OM. EF 50-60%.; Myoview 1/'12: Exercise 9 min, 10 METS with no ischemia/infarction.   . Clotting disorder ([place])   . Diabetes mellitus    Low-dose metformin  . DJD (degenerative joint disease), lumbosacral     status post L4-L5 surgery  . Essential hypertension   . History of DVT of lower extremity     postoperative  . Hyperlipidemia with target LDL less than 70   . Hypothyroidism    Synthroid  . Panhypopituitarism ([place])    Status post pituitary adenoma removal in 2012  . Recurrent deep vein thrombosis (DVT) ([place]) 10/01/2015    Past Surgical History:  Procedure Laterality Date  . BRAIN SURGERY  2012   Removal of pituitary adenoma  . CARDIAC CATHETERIZATION  November 2008   Significant LAD tapering but no significant disease the distal stent. Patent Ramus stent. 90% lesion in small OM 2; small nondominant RCA. Medical therapy. EF 50-60%.  . CORONARY ANGIOPLASTY WITH STENT PLACEMENT  February 2004   PCI-dLAD: 2.25 mm x 16 mm BMS;;PCI- RI: 2.75 mm 18 mm Cypher DES  . NM MYOVIEW LTD  January 2012   Exercise ~9 min - 10 METS; no ischemia or infarction  . PROSTATECTOMY N/A 08/13/2015   Procedure: TRANSVESICLE OPEN PROSTATECTOMY** ;  Surgeon: Carolan Clines, MD;  Location: [facility];  Service: Urology;  Laterality: N/A;  . SPINE SURGERY     L4-L5    Family History  Problem Relation Age of Onset  . Cancer Mother        pancreatic ca    Social History:  reports that he quit smoking about 27 years ago. He has never used smokeless tobacco. He reports that he does not drink alcohol and does not use drugs.  Review of Systems:  Hypertension:  He is taking 10 mg amlodipine and lisinopril 10 mg, amlodipine prescribed by his cardiologist He thinks he is  taking 10 mg amlodipine  Blood pressure is periodically checked at home but he does not remember the readings  No lightheadedness   BP Readings from Last 3 Encounters:  08/28/20 120/78  08/02/20 138/70  07/19/20 126/74   Renal function history:   Has not had  diabetic nephropathy  Creatinine is variable.  He does not think he has taken any OTC ibuprofen or naproxen, only takes Tylenol   Lab Results  Component Value Date   CREATININE 1.61 (H) 08/23/2020   CREATININE 1.34 05/01/2020   CREATININE 1.44 12/28/2019    HYPOPITUITARISM: He has been taking levothyroxine and Cortef as prescribed Takes Cortef 30 mg total as directed in divided doses in the morning and early evening regularly No unusual change in appetite or weakness  Free T4 has been consistently normal with  dosage of 88 mcg levothyroxine  Lab Results  Component Value Date   TSH 2.540 07/19/2020   TSH 4.940 (H) 05/28/2020   TSH 2.920 11/28/2019   FREET4 1.38 07/19/2020   FREET4 1.10 05/01/2020   FREET4 0.96 08/10/2019    Eye exams: He gets this done at the [facility]:  Lab on 08/23/2020  Component Date Value Ref Range Status  . Sodium 08/23/2020 139  135 - 145 mEq/L Final  . Potassium 08/23/2020 4.4  3.5 - 5.1 mEq/L Final  . Chloride 08/23/2020 106  96 - 112 mEq/L Final  . CO2 08/23/2020 27  19 - 32 mEq/L Final  . Glucose, Bld 08/23/2020 137* 70 - 99 mg/dL Final  . BUN 08/23/2020 23  6 - 23 mg/dL Final  . Creatinine, Ser 08/23/2020 1.61* 0.40 - 1.50 mg/dL Final  . GFR 08/23/2020 41.22* >60.00 mL/min Final   Calculated using the CKD-EPI Creatinine Equation (2021)  . Calcium 08/23/2020 9.1  8.4 - 10.5 mg/dL Final  . Hgb A1c MFr Bld 08/23/2020 8.0* 4.6 - 6.5 % Final   Glycemic Control Guidelines for People with Diabetes:Non Diabetic:  <6%Goal of Therapy: <7%Additional Action Suggested:  >8%      Examination:   BP 120/78 (BP Location: Left Arm, Patient Position: Sitting, Cuff Size:  Large)   Pulse 72   Resp 20   Ht 5\' 11"  (1.803 m)   Wt 232 lb (105.2 kg)   SpO2 98%   BMI 32.36 kg/m   Body mass index is 32.36 kg/m.    ASSESSMENT/ PLAN:    HYPERTENSION: His blood pressure is well controlled He will continue 5 mg amlodipine along with 10 mg lisinopril    Diabetes type 2 with mild obesity:   See history of present illness for detailed discussion of current diabetes management, blood sugar patterns and problems identified  A1c is again going up further to 8% now  He is on premixed insulin twice a day started when he had marked hyperglycemia in 09/2018 Also on Janumet XR 100/1000 once a day  He has fairly consistently high postprandial readings after lunch and dinner except occasionally Likely needs to switch to basal bolus insulin regimen However he is wanting to change his diet on his own first with eliminating fast food, usually not getting any sweet drinks Currently refusing to see the dietitian   RENAL dysfunction: Likely to be related to nephrosclerosis and creatinine is slightly higher but etiology unknown No history of microalbuminuria  Hypopituitarism: Hormone deficiencies are adequately replaced with hydrocortisone and levothyroxine Free T4 normal although trending higher   PLAN:  No change levothyroxine or hydrocortisone  Eliminate all fast food and cut back on carbohydrate and high fat lunches and dinners  More consistent monitoring after meals  Regular walking for exercise or other activities  Reassess in 2 months and consider changing insulin regimen at that time  He will check blood pressure regularly  at home also  Since he has worsening RENAL dysfunction he will try indapamide instead of lisinopril  Also consider switching Januvia to [place] or Farxiga  There are no Patient Instructions on file for this visit.     Elayne Snare 08/28/2020, 8:46 AM

## 2020-08-29 ENCOUNTER — Encounter: Payer: Self-pay | Admitting: Cardiology

## 2020-08-29 NOTE — Assessment & Plan Note (Signed)
Borderline pressure today.  He does have room to titrate lisinopril to 20 mg if pressures increase.  For now probably not able to titrate beta-blocker any further.  Amlodipine is at maximum.  Has not had diuretic requirement which would be another option.

## 2020-08-29 NOTE — Assessment & Plan Note (Addendum)
Most recent lipids as of December have been well controlled LDL and total cholesterol.  HDL is also stable.  Continue current dose of atorvastatin. Goal LDL is less than 70, currently 66.  A1c was  7.4.  Is on insulin.  Would consider GLP-1 agonist or SGLT2 inhibitor for cardioprotection.

## 2020-08-29 NOTE — Assessment & Plan Note (Signed)
Stable weight.  We talked about the fact that he probably needs to adjust his diet since exercising is difficult.  I did try to suggest water walking or water aerobics.  He is just been [place] of going to public facilities because of Covid restrictions.

## 2020-08-29 NOTE — Assessment & Plan Note (Signed)
History of two-vessel PCI dating back to 2004 and 2008.  This is before I met him,, and before he moved to [name].  He really has not any anginal symptoms since I met him. We did evaluate by Myoview back in 2012 that was nonischemic.  He exercised 9 minutes reaching 10 METS. At this point, he is not able to exercise much because knee.  Still try to stay somewhat active.    Plan:  Continue Beta-blocker and amlodipine for antianginal benefit as well as blood pressure.->  Cannot titrate beta-blocker further.  Amlodipine at max dose.  Also on ACE inhibitor since he has diabetes.  This also provides afterload reduction and renal protection.  On aspirin and high-dose rosuvastatin.  At this point, with no active symptoms, would not further evaluate unless he does have recurrence of symptoms.

## 2020-09-04 ENCOUNTER — Telehealth: Payer: Self-pay | Admitting: Endocrinology

## 2020-09-04 NOTE — Telephone Encounter (Signed)
Patient called to advise that he has stopped taking Indapamide due to side effects he is experiencing.

## 2020-09-04 NOTE — Telephone Encounter (Signed)
Need to know what side effects he is having

## 2020-09-04 NOTE — Telephone Encounter (Signed)
See below

## 2020-09-05 NOTE — Telephone Encounter (Signed)
Pt was experiencing dry mouth, stomach cramps, pain, tired, and fatigued. Pt is back on previous medication Lisinopril.

## 2020-09-05 NOTE — Telephone Encounter (Signed)
OK 

## 2020-09-25 NOTE — Telephone Encounter (Signed)
Please let him know that he will need to make follow-up with his PCP to have his kidney function rechecked and adjust medication as needed.  Lisinopril was affecting his kidney function

## 2020-09-26 NOTE — Telephone Encounter (Signed)
We should check a BMP in next few weeks since he is on lisinopril - please arrange lab only visit.

## 2020-09-26 NOTE — Telephone Encounter (Signed)
Pt will need a BMP lab only visit for next week if you could make a nurse visit

## 2020-09-27 NOTE — Telephone Encounter (Signed)
lvm for patient to call back to reschedule a lab visit.

## 2020-10-03 ENCOUNTER — Other Ambulatory Visit (INDEPENDENT_AMBULATORY_CARE_PROVIDER_SITE_OTHER): Payer: Medicare Other

## 2020-10-03 ENCOUNTER — Telehealth: Payer: Self-pay

## 2020-10-03 ENCOUNTER — Other Ambulatory Visit: Payer: Self-pay

## 2020-10-03 ENCOUNTER — Other Ambulatory Visit: Payer: Self-pay | Admitting: Registered Nurse

## 2020-10-03 DIAGNOSIS — E1165 Type 2 diabetes mellitus with hyperglycemia: Secondary | ICD-10-CM | POA: Diagnosis not present

## 2020-10-03 DIAGNOSIS — N1831 Chronic kidney disease, stage 3a: Secondary | ICD-10-CM

## 2020-10-03 DIAGNOSIS — Z794 Long term (current) use of insulin: Secondary | ICD-10-CM

## 2020-10-03 DIAGNOSIS — E230 Hypopituitarism: Secondary | ICD-10-CM

## 2020-10-03 LAB — BASIC METABOLIC PANEL
BUN: 25 mg/dL — ABNORMAL HIGH (ref 6–23)
CO2: 22 mEq/L (ref 19–32)
Calcium: 9.2 mg/dL (ref 8.4–10.5)
Chloride: 105 mEq/L (ref 96–112)
Creatinine, Ser: 1.5 mg/dL (ref 0.40–1.50)
GFR: 44.84 mL/min — ABNORMAL LOW (ref >60.00)
Glucose, Bld: 111 mg/dL — ABNORMAL HIGH (ref 70–99)
Potassium: 4.2 mEq/L (ref 3.5–5.1)
Sodium: 137 mEq/L (ref 135–145)

## 2020-10-03 LAB — T4, FREE: Free T4: 0.96 ng/dL (ref 0.60–1.60)

## 2020-10-03 NOTE — Telephone Encounter (Signed)
No additional notes required  

## 2020-10-04 LAB — FRUCTOSAMINE: Fructosamine: 231 umol/L (ref 0–285)

## 2020-10-09 ENCOUNTER — Other Ambulatory Visit: Payer: Self-pay | Admitting: Endocrinology

## 2020-10-17 ENCOUNTER — Ambulatory Visit: Payer: Medicare Other | Admitting: Endocrinology

## 2020-10-23 ENCOUNTER — Other Ambulatory Visit: Payer: Self-pay | Admitting: Endocrinology

## 2020-10-25 ENCOUNTER — Other Ambulatory Visit: Payer: Medicare Other

## 2020-10-30 NOTE — Progress Notes (Signed)
- Patient ID: Carl Gutierrez, male   DOB: [birth year], 75 y.o.   MRN: [id number]           Reason for Appointment: Endocrinology follow-up  History of Present Illness   DIABETES:  Recent history:     INSULIN regimen: Humalog 75/25 insulin, 20 units before breakfast and 8 before dinner  Non-insulin hypoglycemic drugs: Janumet XR 100/1000 daily  His A1c is 8 in March and gradually increasing Fructosamine is improved at 231 as of 4/22  Current management, blood sugar patterns and problems identified:  He was told to increase his evening insulin to 8 units  Also was advised to significantly improve his diet including getting high fat foods and avoiding fast food  He thinks he is doing this most of the time  However not checking enough readings after meals  Highest reading was 258 (this was apparently on vacation last month)  Unable to do much walking because of knee pain  Also no hypoglycemia  He still has a 3 pound weight gain since his last visit  Does not forget to take his insulin doses twice a day  Side effects from medications: None      Monitors blood glucose:  Once or twice a day.    Glucometer:  One Touch           Blood Glucose readings from download   PRE-MEAL Fasting Lunch Dinner Bedtime Overall  Glucose range:  99-134     96-258  Mean/median:  112  160   140   POST-MEAL PC Breakfast PC Lunch PC Dinner  Glucose range:    131-199  Mean/median:    151   Previously:  PRE-MEAL Fasting Lunch Dinner Bedtime Overall  Glucose range:  97-155  160     Mean/median:  137    176   POST-MEAL PC Breakfast PC Lunch PC Dinner  Glucose range:   251-290  92-228  Mean/median:   274      Dietician visit: Most recent: 11/2012     Weight history   Wt Readings from Last 3 Encounters:  10/31/20 235 lb (106.6 kg)  08/28/20 232 lb (105.2 kg)  08/02/20 238 lb (108 kg)           Diabetes labs:  Lab Results  Component Value Date   HGBA1C 8.0 (H) 08/23/2020    HGBA1C 7.4 (H) 05/01/2020   HGBA1C 7.0 (H) 12/28/2019   Lab Results  Component Value Date   MICROALBUR 4.0 (H) 05/01/2020   LDLCALC 66 05/28/2020   CREATININE 1.50 10/03/2020   Other problems discussed today: See review of systems  Allergies as of 10/31/2020      Reactions   Hydrochlorothiazide    Leg and side pain with this   Other    Blood pressure med name unknown.      Medication List       Accurate as of Oct 31, 2020  8:36 AM. If you have any questions, ask your nurse or doctor.        amLODipine 10 MG tablet Commonly known as: NORVASC TAKE 1 TABLET BY MOUTH EVERY DAY   aspirin 81 MG tablet Take 81 mg by mouth daily. Reported on 09/05/2015   B-D UF III MINI PEN NEEDLES 31G X 5 MM Misc Generic drug: Insulin Pen Needle USE FOR INSULIN PEN TWICE A DAY   hydrocortisone 20 MG tablet Commonly known as: CORTEF Take 10-20 mg by mouth 2 (two) times daily. Takes 1 tablet in  the morning and 1/2 tablet at night   indapamide 1.25 MG tablet Commonly known as: LOZOL Take 1 tablet (1.25 mg total) by mouth daily.   Insulin Lispro Prot & Lispro (75-25) 100 UNIT/ML [employer] Commonly known as: HUMALOG 75/25 MIX Inject 20 units under the skin before breakfast and 6 units before supper. (Replaces 70/30) What changed: additional instructions   Janumet XR [phone] MG Tb24 Generic drug: SitaGLIPtin-MetFORMIN HCl TAKE 1 TABLET BY MOUTH EVERY DAY   levothyroxine 88 MCG tablet Commonly known as: SYNTHROID Take 1 tablet (88 mcg total) by mouth daily.   lisinopril 10 MG tablet Commonly known as: ZESTRIL TAKE 1 TABLET BY MOUTH EVERY DAY   metoprolol succinate 25 MG 24 hr tablet Commonly known as: TOPROL-XL TAKE 1 TABLET BY MOUTH EVERY DAY   OneTouch Verio test strip Generic drug: glucose blood USE ONETOUCH VERIO TEST STRIPS AS INSTRUCTED TO CHECK BLOOD SUGAR ONCE DAILY.DX:E11.65   rosuvastatin 40 MG tablet Commonly known as: CRESTOR Take 1 tablet (40 mg total) by mouth  daily.       Allergies:  Allergies  Allergen Reactions  . Hydrochlorothiazide     Leg and side pain with this  . Other     Blood pressure med name unknown.    Past Medical History:  Diagnosis Date  . CAD S/P percutaneous coronary angioplasty February 2004; 2008   PCI-dLAD: 2.25 mm x 16 mm Express BMS; Ramus- 2.75 mm x 18 mm Cypher DES (2.8 mm); follow-up 11/'08: Patent LAD/ramus stents. 90% small OM. EF 50-60%.; Myoview 1/'12: Exercise 9 min, 10 METS with no ischemia/infarction.   . Clotting disorder ([place])   . Diabetes mellitus    Low-dose metformin  . DJD (degenerative joint disease), lumbosacral     status post L4-L5 surgery  . Essential hypertension   . History of DVT of lower extremity     postoperative  . Hyperlipidemia with target LDL less than 70   . Hypothyroidism    Synthroid  . Panhypopituitarism ([place])    Status post pituitary adenoma removal in 2012  . Recurrent deep vein thrombosis (DVT) ([place]) 10/01/2015    Past Surgical History:  Procedure Laterality Date  . BRAIN SURGERY  2012   Removal of pituitary adenoma  . CARDIAC CATHETERIZATION  November 2008   Significant LAD tapering but no significant disease the distal stent. Patent Ramus stent. 90% lesion in small OM 2; small nondominant RCA. Medical therapy. EF 50-60%.  . CORONARY ANGIOPLASTY WITH STENT PLACEMENT  February 2004   PCI-dLAD: 2.25 mm x 16 mm BMS;;PCI- RI: 2.75 mm 18 mm Cypher DES  . NM MYOVIEW LTD  January 2012   Exercise ~9 min - 10 METS; no ischemia or infarction  . PROSTATECTOMY N/A 08/13/2015   Procedure: TRANSVESICLE OPEN PROSTATECTOMY** ;  Surgeon: Carolan Clines, MD;  Location: [facility];  Service: Urology;  Laterality: N/A;  . SPINE SURGERY     L4-L5    Family History  Problem Relation Age of Onset  . Cancer Mother        pancreatic ca    Social History:  reports that he quit smoking about 27 years ago. He has never used smokeless tobacco. He reports that he does not drink alcohol  and does not use drugs.  Review of Systems:  Asking about pain in the left big toe for the last week or so with tenderness, he says it is better with applying an OTC ointment No previous history of gout or known  hyperuricemia   Hypertension:  He is taking 10 mg amlodipine and lisinopril 10 mg, amlodipine prescribed by his cardiologist  Blood pressure is periodically checked at home and reportedly this is about 315+ systolic  He was tried on Lozol instead of lisinopril but he felt it was causing a lot of cramping and stopped it   BP Readings from Last 3 Encounters:  10/31/20 124/70  08/28/20 120/78  08/02/20 138/70   Renal function history:   Has not had diabetic nephropathy  Creatinine is variable.  Not on OTC ibuprofen or naproxen, only takes Tylenol   Lab Results  Component Value Date   CREATININE 1.50 10/03/2020   CREATININE 1.61 (H) 08/23/2020   CREATININE 1.34 05/01/2020    HYPOPITUITARISM: He has been taking levothyroxine and Cortef as prescribed Takes Cortef 30 mg total as directed in divided doses in the morning and early evening regularly No unusual change in appetite or weakness  Free T4 has been consistently normal with  dosage of 88 mcg levothyroxine  Lab Results  Component Value Date   TSH 2.540 07/19/2020   TSH 4.940 (H) 05/28/2020   TSH 2.920 11/28/2019   FREET4 0.96 10/03/2020   FREET4 1.38 07/19/2020   FREET4 1.10 05/01/2020    Eye exams: He gets this done at the [facility]:  No visits with results within 1 Week(s) from this visit.  Latest known visit with results is:  Lab on 10/03/2020  Component Date Value Ref Range Status  . Sodium 10/03/2020 137  135 - 145 mEq/L Final  . Potassium 10/03/2020 4.2  3.5 - 5.1 mEq/L Final  . Chloride 10/03/2020 105  96 - 112 mEq/L Final  . CO2 10/03/2020 22  19 - 32 mEq/L Final  . Glucose, Bld 10/03/2020 111* 70 - 99 mg/dL Final  . BUN 10/03/2020 25* 6 - 23 mg/dL Final  . Creatinine, Ser  10/03/2020 1.50  0.40 - 1.50 mg/dL Final  . GFR 10/03/2020 44.84* >60.00 mL/min Final   Calculated using the CKD-EPI Creatinine Equation (2021)  . Calcium 10/03/2020 9.2  8.4 - 10.5 mg/dL Final  . Free T4 10/03/2020 0.96  0.60 - 1.60 ng/dL Final   Comment: Specimens from patients who are undergoing biotin therapy and /or ingesting biotin supplements may contain high levels of biotin.  The higher biotin concentration in these specimens interferes with this Free T4 assay.  Specimens that contain high levels  of biotin may cause false high results for this Free T4 assay.  Please interpret results in light of the total clinical presentation of the patient.    . Fructosamine 10/03/2020 231  0 - 285 umol/L Final   Comment: Published reference interval for apparently healthy subjects between age 11 and 47 is 37 - 285 umol/L and in a poorly controlled diabetic population is 228 - 563 umol/L with a mean of 396 umol/L.      Examination:   BP 124/70   Pulse 75   Ht 5\' 11"  (1.803 m)   Wt 235 lb (106.6 kg)   SpO2 97%   BMI 32.78 kg/m   Body mass index is 32.78 kg/m.    He has mild foot swelling associated with tenderness as well as slight warmth of the left big toe especially laterally  ASSESSMENT/ PLAN:    HYPERTENSION: His blood pressure is well controlled He will continue 5 mg amlodipine along with 10 mg lisinopril    Diabetes type 2 with mild obesity:  See history of present illness for detailed discussion of current diabetes management, blood sugar patterns and problems identified  A1c is last 8%  He is on premixed insulin twice a day started when he had marked hyperglycemia in 09/2018 Also on Janumet XR 100/1000 once a day  With improving his diet his blood sugars appear to be improving although he is not monitoring after meals much His fructosamine indicates better control However still not able to lose weight   RENAL dysfunction: Slightly better, still on lisinopril 10  mg with normal readings on his blood pressure  Left big toe gout: Has not been evaluated or treated previously   PLAN:  He will continue to improve his diet  No change in insulin as yet  More consistent monitoring after lunch or dinner and call if persistently high  Reduce lisinopril to 5 mg to see if renal function improves  Needs to exercise and walk regularly as tolerated  Check uric acid  Colchicine 1.2 mg today followed by 0.6 mg about an hour later and then once daily until symptoms resolve  Information on gout and low uric acid diet given  There are no Patient Instructions on file for this visit.     Elayne Snare 10/31/2020, 8:36 AM

## 2020-10-31 ENCOUNTER — Other Ambulatory Visit: Payer: Self-pay

## 2020-10-31 ENCOUNTER — Ambulatory Visit (INDEPENDENT_AMBULATORY_CARE_PROVIDER_SITE_OTHER): Payer: Medicare Other | Admitting: Endocrinology

## 2020-10-31 ENCOUNTER — Encounter: Payer: Self-pay | Admitting: Endocrinology

## 2020-10-31 VITALS — BP 124/70 | HR 75 | Ht 71.0 in | Wt 235.0 lb

## 2020-10-31 DIAGNOSIS — Z794 Long term (current) use of insulin: Secondary | ICD-10-CM

## 2020-10-31 DIAGNOSIS — M109 Gout, unspecified: Secondary | ICD-10-CM

## 2020-10-31 DIAGNOSIS — I10 Essential (primary) hypertension: Secondary | ICD-10-CM

## 2020-10-31 DIAGNOSIS — N289 Disorder of kidney and ureter, unspecified: Secondary | ICD-10-CM

## 2020-10-31 DIAGNOSIS — E1165 Type 2 diabetes mellitus with hyperglycemia: Secondary | ICD-10-CM

## 2020-10-31 LAB — BASIC METABOLIC PANEL
BUN: 20 mg/dL (ref 6–23)
CO2: 24 mEq/L (ref 19–32)
Calcium: 9.3 mg/dL (ref 8.4–10.5)
Chloride: 106 mEq/L (ref 96–112)
Creatinine, Ser: 1.45 mg/dL (ref 0.40–1.50)
GFR: 46.67 mL/min — ABNORMAL LOW (ref >60.00)
Glucose, Bld: 149 mg/dL — ABNORMAL HIGH (ref 70–99)
Potassium: 4.3 mEq/L (ref 3.5–5.1)
Sodium: 140 mEq/L (ref 135–145)

## 2020-10-31 LAB — URIC ACID: Uric Acid, Serum: 7.2 mg/dL (ref 4.0–7.8)

## 2020-10-31 MED ORDER — COLCHICINE 0.6 MG PO TABS: ORAL_TABLET | ORAL | 1 refills | Status: DC

## 2020-10-31 MED ORDER — LISINOPRIL 5 MG PO TABS: 5.0000 mg | ORAL_TABLET | Freq: Every day | ORAL | 3 refills | Status: DC

## 2020-10-31 NOTE — Progress Notes (Signed)
Please call to let patient know that the kidney test is better, uric acid is not high Need to consistently reduce red meat in diet and no medication needed

## 2020-10-31 NOTE — Patient Instructions (Addendum)
Check blood sugars on waking up  3days a week  Also check blood sugars about 2 hours after meals and do this after different meals by rotation  Recommended blood sugar levels on waking up are 90-130 and about 2 hours after meal is 130-160  Please bring your blood sugar monitor to each visit, thank you  8 units before supper  .Gout   Gout is painful swelling of your joints. Gout is a type of arthritis. It is caused by having too much uric acid in your body. Uric acid is a chemical that is made when your body breaks down substances called purines. If your body has too much uric acid, sharp crystals can form and build up in your joints. This causes pain and swelling. Gout attacks can happen quickly and be very painful (acute gout). Over time, the attacks can affect more joints and happen more often (chronic gout). What are the causes?  Too much uric acid in your blood. This can happen because: ? Your kidneys do not remove enough uric acid from your blood. ? Your body makes too much uric acid. ? You eat too many foods that are high in purines. These foods include organ meats, some seafood, and beer.  Trauma or stress. What increases the risk?  Having a family history of gout.  Being male and middle-aged.  Being male and having gone through menopause.  Being very overweight (obese).  Drinking alcohol, especially beer.  Not having enough water in the body (being dehydrated).  Losing weight too quickly.  Having an organ transplant.  Having lead poisoning.  Taking certain medicines.  Having kidney disease.  Having a skin condition called psoriasis. What are the signs or symptoms? An attack of acute gout usually happens in just one joint. The most common place is the big toe. Attacks often start at night. Other joints that may be affected include joints of the feet, ankle, knee, fingers, wrist, or elbow. Symptoms of an attack may include:  Very bad  pain.  Warmth.  Swelling.  Stiffness.  Shiny, red, or purple skin.  Tenderness. The affected joint may be very painful to touch.  Chills and fever. Chronic gout may cause symptoms more often. More joints may be involved. You may also have white or yellow lumps (tophi) on your hands or feet or in other areas near your joints.    How is this treated?  Treatment for this condition has two phases: treating an acute attack and preventing future attacks.  Acute gout treatment may include: ? NSAIDs. ? Steroids. These are taken by mouth or injected into a joint. ? Colchicine. This medicine relieves pain and swelling. It can be given by mouth or through an IV tube.  Preventive treatment may include: ? Taking small doses of NSAIDs or colchicine daily. ? Using a medicine that reduces uric acid levels in your blood. ? Making changes to your diet. You may need to see a food expert (dietitian) about what to eat and drink to prevent gout. Follow these instructions at home: During a gout attack  If told, put ice on the painful area: ? Put ice in a plastic bag. ? Place a towel between your skin and the bag. ? Leave the ice on for 20 minutes, 2-3 times a day.  Raise (elevate) the painful joint above the level of your heart as often as you can.  Rest the joint as much as possible. If the joint is in your leg, you may  be given crutches.  Follow instructions from your doctor about what you cannot eat or drink.    Avoiding future gout attacks  Eat a low-purine diet. Avoid foods and drinks such as: ? Liver. ? Kidney. ? Anchovies. ? Asparagus. ? Herring. ? Mushrooms. ? Mussels. ? Beer.  Stay at a healthy weight. If you want to lose weight, talk with your doctor. Do not lose weight too fast.  Start or continue an exercise plan as told by your doctor. Eating and drinking  Drink enough fluids to keep your pee (urine) pale yellow.  If you drink alcohol: ? Limit how much you use  to:  0-1 drink a day for women.  0-2 drinks a day for men. ? Be aware of how much alcohol is in your drink. In the [place] one drink equals one 12 oz bottle of beer (355 mL), one 5 oz glass of wine (148 mL), or one 1 oz glass of hard liquor (44 mL). General instructions  Take over-the-counter and prescription medicines only as told by your doctor.  Do not drive or use heavy machinery while taking prescription pain medicine.  Return to your normal activities as told by your doctor. Ask your doctor what activities are safe for you.  Keep all follow-up visits as told by your doctor. This is important. Contact a doctor if:  You have another gout attack.  You still have symptoms of a gout attack after 10 days of treatment.  You have problems (side effects) because of your medicines.  You have chills or a fever.  You have burning pain when you pee (urinate).  You have pain in your lower back or belly. Get help right away if:  You have very bad pain.  Your pain cannot be controlled.  You cannot pee. Summary  Gout is painful swelling of the joints.  The most common site of pain is the big toe, but it can affect other joints.  Medicines and avoiding some foods can help to prevent and treat gout attacks. This information is not intended to replace advice given to you by your health care provider. Make sure you discuss any questions you have with your health care provider. Document Revised: 12/23/2017 Document Reviewed: 12/23/2017 Elsevier Patient Education  [place].

## 2020-11-02 ENCOUNTER — Telehealth: Payer: Self-pay | Admitting: Endocrinology

## 2020-11-02 NOTE — Telephone Encounter (Signed)
Patient's wife [name] states she is returning call re: a voice mail Patient received 11/01/20 to call Dr. Ronnie Derby office. Patient/[name]'s ph# [phone].

## 2020-11-02 NOTE — Telephone Encounter (Signed)
Patient's wife [name] states she/Patient missed a call from our office today at 12:55 pm. [name] states no message was left on voice mail. [name] requests that Patient/Carl Gutierrez be called at ph# [phone].

## 2020-11-02 NOTE — Telephone Encounter (Signed)
It was regarding lab results. Spoken to patient and notified Dr Ronnie Derby comments. Verbalized understanding.

## 2020-11-17 ENCOUNTER — Other Ambulatory Visit: Payer: Self-pay | Admitting: Family Medicine

## 2020-11-17 DIAGNOSIS — E039 Hypothyroidism, unspecified: Secondary | ICD-10-CM

## 2020-11-23 ENCOUNTER — Other Ambulatory Visit: Payer: Self-pay | Admitting: Endocrinology

## 2020-12-05 ENCOUNTER — Ambulatory Visit (INDEPENDENT_AMBULATORY_CARE_PROVIDER_SITE_OTHER): Payer: Medicare Other | Admitting: Family Medicine

## 2020-12-05 ENCOUNTER — Encounter: Payer: Self-pay | Admitting: Family Medicine

## 2020-12-05 ENCOUNTER — Other Ambulatory Visit: Payer: Self-pay

## 2020-12-05 VITALS — BP 128/66 | HR 58 | Temp 98.0°F | Resp 16 | Ht 71.0 in | Wt 232.4 lb

## 2020-12-05 DIAGNOSIS — E1165 Type 2 diabetes mellitus with hyperglycemia: Secondary | ICD-10-CM

## 2020-12-05 DIAGNOSIS — I10 Essential (primary) hypertension: Secondary | ICD-10-CM | POA: Diagnosis not present

## 2020-12-05 DIAGNOSIS — N1831 Chronic kidney disease, stage 3a: Secondary | ICD-10-CM

## 2020-12-05 DIAGNOSIS — E785 Hyperlipidemia, unspecified: Secondary | ICD-10-CM | POA: Diagnosis not present

## 2020-12-05 DIAGNOSIS — E039 Hypothyroidism, unspecified: Secondary | ICD-10-CM

## 2020-12-05 DIAGNOSIS — Z794 Long term (current) use of insulin: Secondary | ICD-10-CM

## 2020-12-05 LAB — LIPID PANEL
Cholesterol: 128 mg/dL (ref 0–200)
HDL: 37.2 mg/dL — ABNORMAL LOW (ref >39.00)
LDL Cholesterol: 57 mg/dL (ref 0–99)
NonHDL: 90.41
Total CHOL/HDL Ratio: 3
Triglycerides: 167 mg/dL — ABNORMAL HIGH (ref 0.0–149.0)
VLDL: 33.4 mg/dL (ref 0.0–40.0)

## 2020-12-05 LAB — COMPREHENSIVE METABOLIC PANEL
ALT: 15 U/L (ref 0–53)
AST: 13 U/L (ref 0–37)
Albumin: 4.4 g/dL (ref 3.5–5.2)
Alkaline Phosphatase: 71 U/L (ref 39–117)
BUN: 26 mg/dL — ABNORMAL HIGH (ref 6–23)
CO2: 23 mEq/L (ref 19–32)
Calcium: 9.4 mg/dL (ref 8.4–10.5)
Chloride: 107 mEq/L (ref 96–112)
Creatinine, Ser: 1.47 mg/dL (ref 0.40–1.50)
GFR: 45.88 mL/min — ABNORMAL LOW (ref >60.00)
Glucose, Bld: 112 mg/dL — ABNORMAL HIGH (ref 70–99)
Potassium: 4.2 mEq/L (ref 3.5–5.1)
Sodium: 140 mEq/L (ref 135–145)
Total Bilirubin: 0.3 mg/dL (ref 0.2–1.2)
Total Protein: 7.4 g/dL (ref 6.0–8.3)

## 2020-12-05 LAB — TSH: TSH: 4.64 u[IU]/mL — ABNORMAL HIGH (ref 0.35–4.50)

## 2020-12-05 MED ORDER — AMLODIPINE BESYLATE 10 MG PO TABS
1.0000 | ORAL_TABLET | Freq: Every day | ORAL | 1 refills | Status: DC
Start: 2020-12-05 — End: 2021-06-18

## 2020-12-05 MED ORDER — ROSUVASTATIN CALCIUM 40 MG PO TABS
40.0000 mg | ORAL_TABLET | Freq: Every day | ORAL | 1 refills | Status: DC
Start: 2020-12-05 — End: 2021-06-02

## 2020-12-05 MED ORDER — LEVOTHYROXINE SODIUM 88 MCG PO TABS: 88.0000 ug | ORAL_TABLET | Freq: Every day | ORAL | 1 refills | Status: DC

## 2020-12-05 MED ORDER — METOPROLOL SUCCINATE ER 25 MG PO TB24: 1.0000 | ORAL_TABLET | Freq: Every day | ORAL | 1 refills | Status: DC

## 2020-12-05 NOTE — Progress Notes (Signed)
Subjective:  Patient ID: Carl Gutierrez, male    DOB: 07-21-1945  Age: 75 y.o. MRN: [id number]  CC:  Chief Complaint  Patient presents with   Hypertension    Pt here for refills today, no side effects denies physical sxs   Hypothyroidism    Recheck, and refill today no side effects   Hyperlipidemia    6 month recheck with refills     HPI Carl Gutierrez presents for   Diabetes with CKD, hyperglycemia treated by Dr. Dwyane Gutierrez, endocrinology.  Office visit noted from May 18.  Treated with Humalog 75/25, 25 units before breakfast, 8 before dinner and Janumet XR100/1000 daily.  Dietary changes recommended.  Also discussed gout treatment with colchicine and reduction of red meat/low uric acid diet. Lab Results  Component Value Date   HGBA1C 8.0 (H) 08/23/2020   Hypothyroidism: Lab Results  Component Value Date   TSH 2.540 07/19/2020   Taking medication daily.  Synthroid 88 mcg No new hot or cold intolerance. No new hair or skin changes, heart palpitations or new fatigue. No new weight changes.   Hypertension: With stage IIIa CKD Lisinopril 5 mg daily, Toprol-XL 25 mg daily, amlodipine 10 mg daily, no new side effects. Did not tolerate indapamide.  Drinking water throughout the day, avoiding nsaids.  Home readings: 140/70.  BP Readings from Last 3 Encounters:  12/05/20 128/66  10/31/20 124/70  08/28/20 120/78   Lab Results  Component Value Date   CREATININE 1.45 10/31/2020    Hyperlipidemia: Crestor 40 mg daily. No new myalgias,side effects.  Lab Results  Component Value Date   CHOL 130 05/28/2020   HDL 40 05/28/2020   LDLCALC 66 05/28/2020   LDLDIRECT 85.0 05/18/2014   TRIG 137 05/28/2020   CHOLHDL 3.3 05/28/2020   Lab Results  Component Value Date   ALT 15 05/01/2020   AST 16 05/01/2020   ALKPHOS 68 05/01/2020   BILITOT 0.3 05/01/2020   Health maintenance: Second COVID booster recommended.  Shingles vaccine discussed - declined.    History Patient Active  Problem List   Diagnosis Date Noted   Gout of big toe 10/31/2020   Stage 3a chronic kidney disease (Carl Gutierrez) 01/04/2020   Anemia in chronic kidney disease 11/30/2015   Recurrent deep vein thrombosis (DVT) (Carl Gutierrez) 10/01/2015   Hematuria, unspecified 10/01/2015   Elevated serum creatinine 10/01/2015   BPH (benign prostatic hyperplasia) 08/13/2015   Benign localized hyperplasia of prostate with urinary obstruction 07/23/2015   Preop cardiovascular exam 07/17/2015   Encounter for CDL (commercial driving license) exam 29/51/8841   Obesity (BMI 30-39.9) 06/26/2013   Essential hypertension 06/26/2013   Panhypopituitarism ([place]) 11/17/2011     11/17/2011   Diabetes mellitus ([place]) 11/17/2011   Hyperlipidemia associated with type 2 diabetes mellitus (Carl Gutierrez) 11/17/2011   CAD S/P percutaneous coronary angioplasty 07/19/2002   Past Medical History:  Diagnosis Date   CAD S/P percutaneous coronary angioplasty February 2004; 2008   PCI-dLAD: 2.25 mm x 16 mm Express BMS; Ramus- 2.75 mm x 18 mm Cypher DES (2.8 mm); follow-up 11/'08: Patent LAD/ramus stents. 90% small OM. EF 50-60%.; Myoview 1/'12: Exercise 9 min, 10 METS with no ischemia/infarction.    Clotting disorder (HCC)    Diabetes mellitus    Low-dose metformin   DJD (degenerative joint disease), lumbosacral     status post L4-L5 surgery   Essential hypertension    History of DVT of lower extremity     postoperative   Hyperlipidemia with target LDL less than 70  Hypothyroidism    Synthroid   Panhypopituitarism (Carl Gutierrez)    Status post pituitary adenoma removal in 2012   Recurrent deep vein thrombosis (DVT) (Carl Gutierrez) 10/01/2015   Past Surgical History:  Procedure Laterality Date   BRAIN SURGERY  2012   Removal of pituitary adenoma   CARDIAC CATHETERIZATION  November 2008   Significant LAD tapering but no significant disease the distal stent. Patent Ramus stent. 90% lesion in small OM 2; small nondominant RCA. Medical therapy. EF 50-60%.   CORONARY  ANGIOPLASTY WITH STENT PLACEMENT  February 2004   PCI-dLAD: 2.25 mm x 16 mm BMS;;PCI- RI: 2.75 mm 18 mm Cypher DES   NM MYOVIEW LTD  January 2012   Exercise ~9 min - 10 METS; no ischemia or infarction   PROSTATECTOMY N/A 08/13/2015   Procedure: TRANSVESICLE OPEN PROSTATECTOMY** ;  Surgeon: Carl Gutierrez;  Location: [facility];  Service: Urology;  Laterality: N/A;   SPINE SURGERY     L4-L5   Allergies  Allergen Reactions   Hydrochlorothiazide     Leg and side pain with this   Other     Blood pressure med name unknown.   Prior to Admission medications   Medication Sig Start Date End Date Taking? Authorizing Provider  amLODipine (NORVASC) 10 MG tablet TAKE 1 TABLET BY MOUTH EVERY DAY 06/11/20  Yes Carl Gutierrez  aspirin 81 MG tablet Take 81 mg by mouth daily. Reported on 09/05/2015   Yes Provider, Historical, Gutierrez  B-D UF III MINI PEN NEEDLES 31G X 5 MM MISC USE FOR INSULIN PEN TWICE A DAY 08/18/19  Yes Carl Snare, Gutierrez  colchicine 0.6 MG tablet 2 tablets now, take third tablet after 1 hour and then 1 tablet daily with food until foot pain improved 10/31/20  Yes Carl Snare, Gutierrez  hydrocortisone (CORTEF) 20 MG tablet Take 10-20 mg by mouth 2 (two) times daily. Takes 1 tablet in the morning and 1/2 tablet at night   Yes Provider, Historical, Gutierrez  Insulin Lispro Prot & Lispro (HUMALOG 75/25 MIX) (75-25) 100 UNIT/ML Kwikpen Inject 20 units under the skin before breakfast and 6 units before supper. (Replaces 70/30) Patient taking differently: Inject 20 units under the skin before breakfast and 8 units before supper. (Replaces 70/30) 06/10/20  Yes Carl Snare, Gutierrez  JANUMET XR [phone] MG TB24 TAKE 1 TABLET BY MOUTH EVERY DAY 10/09/20  Yes Carl Snare, Gutierrez  levothyroxine (SYNTHROID) 88 MCG tablet TAKE 1 TABLET BY MOUTH EVERY DAY 11/19/20  Yes Carl Agreste, Gutierrez  lisinopril (ZESTRIL) 5 MG tablet Take 1 tablet (5 mg total) by mouth daily. 10/31/20  Yes Carl Snare, Gutierrez  metoprolol succinate (TOPROL-XL)  25 MG 24 hr tablet TAKE 1 TABLET BY MOUTH EVERY DAY 06/11/20  Yes Carl Gutierrez  [facility] Gutierrez [facility] VERIO test strip USE AS INSTRUCTED TO CHECK BLOOD SUGAR ONCE DAILY. 11/23/20  Yes Carl Snare, Gutierrez  rosuvastatin (CRESTOR) 40 MG tablet Take 1 tablet (40 mg total) by mouth daily. 05/28/20  Yes Carl Agreste, Gutierrez  testosterone cypionate (DEPO-TESTOSTERONE) 200 MG/ML injection Inject 1.5 mLs (300 mg total) into the muscle every 21 ( twenty-one) days. 11/23/14 03/14/15  Carl Snare, Gutierrez   Social History   Socioeconomic History   Marital status: Married    Spouse name: Not on file   Number of children: Not on file   Years of education: Not on file   Highest education level: Not on file  Occupational History   Not on  file  Tobacco Use   Smoking status: Former    Pack years: 0.00    Types: Cigarettes    Quit date: 06/24/1993    Years since quitting: 27.4   Smokeless tobacco: Never  Vaping Use   Vaping Use: Never used  Substance and Sexual Activity   Alcohol use: No   Drug use: No   Sexual activity: Not on file    Comment: Local truck driver, 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 Exercises only occasionally. Not as much as he desires 2. 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   He does not smoke cigarettes -- quit in 1995. Does not drink alcohol.   Social Determinants of Health   Financial Resource Strain: Not on file  Food Insecurity: Not on file  Transportation Needs: Not on file  Physical Activity: Not on file  Stress: Not on file  Social Connections: Not on file  Intimate Partner Violence: Not on file    Review of Systems  Constitutional:  Negative for fatigue and unexpected weight change.  Eyes:  Negative for visual disturbance.  Respiratory:  Negative for cough, chest tightness and shortness of breath.   Cardiovascular:  Negative for chest pain, palpitations and leg  swelling.  Gastrointestinal:  Negative for abdominal pain and blood in stool.  Neurological:  Negative for dizziness, light-headedness and headaches.    Objective:   Vitals:   12/05/20 0754  BP: 128/66  Pulse: (!) 58  Resp: 16  Temp: 98 F (36.7 C)  TempSrc: Temporal  SpO2: 98%  Weight: 232 lb 6.4 oz (105.4 kg)  Height: 5\' 11"  (1.803 m)     Physical Exam Vitals reviewed.  Constitutional:      Appearance: He is well-developed.  HENT:     Head: Normocephalic and atraumatic.  Neck:     Vascular: No carotid bruit or JVD.  Cardiovascular:     Rate and Rhythm: Normal rate and regular rhythm.     Heart sounds: Normal heart sounds. No murmur heard. Pulmonary:     Effort: Pulmonary effort is normal.     Breath sounds: Normal breath sounds. No rales.  Musculoskeletal:     Right lower leg: No edema.     Left lower leg: No edema.  Skin:    General: Skin is warm and dry.  Neurological:     Mental Status: He is alert and oriented to person, place, and time.  Psychiatric:        Mood and Affect: Mood normal.       Assessment & Plan:  Horice Carrero is a 75 y.o. male . Essential hypertension - Plan: amLODipine (NORVASC) 10 MG tablet, metoprolol succinate (TOPROL-XL) 25 MG 24 hr tablet, Comprehensive metabolic panel Stage 3a chronic kidney disease ([place]) - Plan: Comprehensive metabolic panel  -  Stable, tolerating current regimen. Medications refilled. Labs pending as above.   Hypothyroidism - Plan: levothyroxine (SYNTHROID) 88 MCG tablet, Comprehensive metabolic panel, TSH  -  Stable, tolerating current regimen. Medications refilled. Labs pending as above.   Type 2 diabetes mellitus with hyperglycemia, with long-term current use of insulin (HCC)  - decreased control - dietary changes planned, continue follow up with endocrinology.   Hyperlipidemia, unspecified hyperlipidemia type - Plan: rosuvastatin (CRESTOR) 40 MG tablet, Lipid panel  -  Stable, tolerating current  regimen. Medications refilled. Labs pending  as above.   Recommended 2nd covid vaccine booster.   No orders of the defined types were placed in this encounter.  There are no Patient Instructions on file for this visit.    Signed,   [name], Gutierrez Ashley, [facility] 12/05/20 8:15 AM

## 2020-12-05 NOTE — Patient Instructions (Addendum)
I do recommend the second covid 19 vaccine booster.  Keep follow up with Dr. Dwyane Dee as planned.   Thanks for coming in today and take care.

## 2020-12-07 ENCOUNTER — Other Ambulatory Visit: Payer: Self-pay | Admitting: Endocrinology

## 2020-12-18 ENCOUNTER — Other Ambulatory Visit: Payer: Self-pay

## 2020-12-18 DIAGNOSIS — R7989 Other specified abnormal findings of blood chemistry: Secondary | ICD-10-CM

## 2021-01-24 ENCOUNTER — Other Ambulatory Visit: Payer: Self-pay

## 2021-01-24 ENCOUNTER — Other Ambulatory Visit (INDEPENDENT_AMBULATORY_CARE_PROVIDER_SITE_OTHER): Payer: Medicare Other

## 2021-01-24 DIAGNOSIS — M109 Gout, unspecified: Secondary | ICD-10-CM | POA: Diagnosis not present

## 2021-01-24 DIAGNOSIS — Z794 Long term (current) use of insulin: Secondary | ICD-10-CM | POA: Diagnosis not present

## 2021-01-24 DIAGNOSIS — E1165 Type 2 diabetes mellitus with hyperglycemia: Secondary | ICD-10-CM

## 2021-01-24 LAB — BASIC METABOLIC PANEL WITH GFR
BUN: 22 mg/dL (ref 6–23)
CO2: 25 meq/L (ref 19–32)
Calcium: 9.3 mg/dL (ref 8.4–10.5)
Chloride: 106 meq/L (ref 96–112)
Creatinine, Ser: 1.48 mg/dL (ref 0.40–1.50)
GFR: 45.47 mL/min — ABNORMAL LOW
Glucose, Bld: 123 mg/dL — ABNORMAL HIGH (ref 70–99)
Potassium: 4.5 meq/L (ref 3.5–5.1)
Sodium: 138 meq/L (ref 135–145)

## 2021-01-24 LAB — T4, FREE: Free T4: 1 ng/dL (ref 0.60–1.60)

## 2021-01-24 LAB — HEMOGLOBIN A1C: Hgb A1c MFr Bld: 7.6 % — ABNORMAL HIGH (ref 4.6–6.5)

## 2021-01-24 LAB — URIC ACID: Uric Acid, Serum: 8 mg/dL — ABNORMAL HIGH (ref 4.0–7.8)

## 2021-01-31 ENCOUNTER — Ambulatory Visit (INDEPENDENT_AMBULATORY_CARE_PROVIDER_SITE_OTHER): Payer: Medicare Other | Admitting: Endocrinology

## 2021-01-31 ENCOUNTER — Encounter: Payer: Self-pay | Admitting: Endocrinology

## 2021-01-31 ENCOUNTER — Other Ambulatory Visit: Payer: Self-pay

## 2021-01-31 VITALS — BP 140/76 | HR 65 | Ht 71.0 in | Wt 234.2 lb

## 2021-01-31 DIAGNOSIS — E230 Hypopituitarism: Secondary | ICD-10-CM | POA: Diagnosis not present

## 2021-01-31 DIAGNOSIS — E1165 Type 2 diabetes mellitus with hyperglycemia: Secondary | ICD-10-CM | POA: Diagnosis not present

## 2021-01-31 DIAGNOSIS — N289 Disorder of kidney and ureter, unspecified: Secondary | ICD-10-CM

## 2021-01-31 DIAGNOSIS — I10 Essential (primary) hypertension: Secondary | ICD-10-CM | POA: Diagnosis not present

## 2021-01-31 DIAGNOSIS — Z794 Long term (current) use of insulin: Secondary | ICD-10-CM

## 2021-01-31 NOTE — Patient Instructions (Addendum)
Insulin 22 in am  Get BP monitor

## 2021-01-31 NOTE — Progress Notes (Signed)
Patient ID: Carl Gutierrez, male   DOB: 05-05-1946, 75 y.o.   MRN: [id number]           Reason for Appointment: Endocrinology follow-up  History of Present Illness   DIABETES type II:  Recent history:     INSULIN regimen: Humalog 75/25 insulin, 20 units before breakfast and 8 before dinner  Non-insulin hypoglycemic drugs: Janumet XR 100/1000 daily  His A1c is 7.6 compared to 8% before  Current management, blood sugar patterns and problems identified: He did not bring his monitor for download today His A1c is usually higher than expected for his home blood sugars although previously he will check blood sugars mostly in the mornings He now says that he is checking his blood sugars consistently by rotation after each meal but not clear how often Only occasionally will have a high reading after one of his meals especially lunch if he is eating more carbohydrate He thinks he is generally trying to avoid high-fat meals or eating out at fast food places Highest reading was 220 He has limited amount of walking because of knee pain, will sometimes do more walking when feeling better Recently no hypoglycemia He has had only minimal weight fluctuation recently  Side effects from medications: None      Monitors blood glucose:  Once or twice a day.    Glucometer:  One Touch           Blood Glucose readings from recall:   PRE-MEAL Fasting Lunch Dinner Bedtime Overall  Glucose range: 120s      Mean/median:        POST-MEAL PC Breakfast PC Lunch PC Dinner  Glucose range:  160-225 130-165  Mean/median:      Previously:  PRE-MEAL Fasting Lunch Dinner Bedtime Overall  Glucose range:  99-134     96-258  Mean/median:  112  160   140   POST-MEAL PC Breakfast PC Lunch PC Dinner  Glucose range:    131-199  Mean/median:    151      Dietician visit: Most recent: 11/2012     Weight history   Wt Readings from Last 3 Encounters:  01/31/21 234 lb 3.2 oz (106.2 kg)  12/05/20 232 lb 6.4  oz (105.4 kg)  10/31/20 235 lb (106.6 kg)           Diabetes labs:  Lab Results  Component Value Date   HGBA1C 7.6 (H) 01/24/2021   HGBA1C 8.0 (H) 08/23/2020   HGBA1C 7.4 (H) 05/01/2020   Lab Results  Component Value Date   MICROALBUR 4.0 (H) 05/01/2020   LDLCALC 57 12/05/2020   CREATININE 1.48 01/24/2021   Other problems discussed today: See review of systems  Allergies as of 01/31/2021       Reactions   Hydrochlorothiazide    Leg and side pain with this   Other    Blood pressure med name unknown.        Medication List        Accurate as of January 31, 2021  8:45 AM. If you have any questions, ask your nurse or doctor.          amLODipine 10 MG tablet Commonly known as: NORVASC Take 1 tablet (10 mg total) by mouth daily.   aspirin 81 MG tablet Take 81 mg by mouth daily. Reported on 09/05/2015   B-D UF III MINI PEN NEEDLES 31G X 5 MM Misc Generic drug: Insulin Pen Needle USE FOR INSULIN PEN TWICE A DAY  colchicine 0.6 MG tablet 2 tablets now, take third tablet after 1 hour and then 1 tablet daily with food until foot pain improved   hydrocortisone 20 MG tablet Commonly known as: CORTEF Take 10-20 mg by mouth 2 (two) times daily. Takes 1 tablet in the morning and 1/2 tablet at night   Insulin Lispro Prot & Lispro (75-25) 100 UNIT/ML Kwikpen Commonly known as: HUMALOG 75/25 MIX INJECT 20 UNITS UNDER THE SKIN BEFORE BREAKFAST AND 6 UNITS BEFORE SUPPER. (REPLACES 70/30)   Janumet XR [phone] MG Tb24 Generic drug: SitaGLIPtin-MetFORMIN HCl TAKE 1 TABLET BY MOUTH EVERY DAY   levothyroxine 88 MCG tablet Commonly known as: SYNTHROID Take 1 tablet (88 mcg total) by mouth daily.   lisinopril 5 MG tablet Commonly known as: ZESTRIL Take 1 tablet (5 mg total) by mouth daily.   metoprolol succinate 25 MG 24 hr tablet Commonly known as: TOPROL-XL Take 1 tablet (25 mg total) by mouth daily.   OneTouch Verio test strip Generic drug: glucose blood USE AS  INSTRUCTED TO CHECK BLOOD SUGAR ONCE DAILY.   rosuvastatin 40 MG tablet Commonly known as: CRESTOR Take 1 tablet (40 mg total) by mouth daily.        Allergies:  Allergies  Allergen Reactions   Hydrochlorothiazide     Leg and side pain with this   Other     Blood pressure med name unknown.    Past Medical History:  Diagnosis Date   CAD S/P percutaneous coronary angioplasty February 2004; 2008   PCI-dLAD: 2.25 mm x 16 mm Express BMS; Ramus- 2.75 mm x 18 mm Cypher DES (2.8 mm); follow-up 11/'08: Patent LAD/ramus stents. 90% small OM. EF 50-60%.; Myoview 1/'12: Exercise 9 min, 10 METS with no ischemia/infarction.    Clotting disorder (HCC)    Diabetes mellitus    Low-dose metformin   DJD (degenerative joint disease), lumbosacral     status post L4-L5 surgery   Essential hypertension    History of DVT of lower extremity     postoperative   Hyperlipidemia with target LDL less than 70    Hypothyroidism    Synthroid   Panhypopituitarism ([place])    Status post pituitary adenoma removal in 2012   Recurrent deep vein thrombosis (DVT) ([place]) 10/01/2015    Past Surgical History:  Procedure Laterality Date   BRAIN SURGERY  2012   Removal of pituitary adenoma   CARDIAC CATHETERIZATION  November 2008   Significant LAD tapering but no significant disease the distal stent. Patent Ramus stent. 90% lesion in small OM 2; small nondominant RCA. Medical therapy. EF 50-60%.   CORONARY ANGIOPLASTY WITH STENT PLACEMENT  February 2004   PCI-dLAD: 2.25 mm x 16 mm BMS;;PCI- RI: 2.75 mm 18 mm Cypher DES   NM MYOVIEW LTD  January 2012   Exercise ~9 min - 10 METS; no ischemia or infarction   PROSTATECTOMY N/A 08/13/2015   Procedure: TRANSVESICLE OPEN PROSTATECTOMY** ;  Surgeon: Carolan Clines, MD;  Location: [facility];  Service: Urology;  Laterality: N/A;   SPINE SURGERY     L4-L5    Family History  Problem Relation Age of Onset   Cancer Mother        pancreatic ca    Social History:   reports that he quit smoking about 27 years ago. His smoking use included cigarettes. He has never used smokeless tobacco. He reports that he does not drink alcohol and does not use drugs.  Review of Systems:  No further recurrence  of gout, treated with colchicine in 5/22 Not on allopurinol currently  Lab Results  Component Value Date   LABURIC 8.0 (H) 01/24/2021     Hypertension:  He is taking 10 mg amlodipine and lisinopril 10 mg, amlodipine prescribed by his cardiologist  Blood pressure is recently not been checked at home    BP Readings from Last 3 Encounters:  01/31/21 140/76  12/05/20 128/66  10/31/20 124/70   Renal function history:   Creatinine is recently stable.  Not on OTC ibuprofen or naproxen, only takes Tylenol   Lab Results  Component Value Date   CREATININE 1.48 01/24/2021   CREATININE 1.47 12/05/2020   CREATININE 1.45 10/31/2020    HYPOPITUITARISM: He has been taking levothyroxine and Cortef as prescribed Takes Cortef 30 mg total as directed in divided doses in the morning and early evening regularly No unusual change in appetite or weakness  Free T4 has been consistently normal with  dosage of 88 mcg levothyroxine, TSH appears to be inconsistent  Lab Results  Component Value Date   TSH 4.64 (H) 12/05/2020   TSH 2.540 07/19/2020   TSH 4.940 (H) 05/28/2020   FREET4 1.00 01/24/2021   FREET4 0.96 10/03/2020   FREET4 1.38 07/19/2020    Eye exams: He gets these done at the [facility]:  No visits with results within 1 Week(s) from this visit.  Latest known visit with results is:  Lab on 01/24/2021  Component Date Value Ref Range Status   Uric Acid, Serum 01/24/2021 8.0 (A) 4.0 - 7.8 mg/dL Final   Free T4 01/24/2021 1.00  0.60 - 1.60 ng/dL Final   Comment: Specimens from patients who are undergoing biotin therapy and /or ingesting biotin supplements may contain high levels of biotin.  The higher biotin concentration in these  specimens interferes with this Free T4 assay.  Specimens that contain high levels  of biotin may cause false high results for this Free T4 assay.  Please interpret results in light of the total clinical presentation of the patient.     Sodium 01/24/2021 138  135 - 145 mEq/L Final   Potassium 01/24/2021 4.5  3.5 - 5.1 mEq/L Final   Chloride 01/24/2021 106  96 - 112 mEq/L Final   CO2 01/24/2021 25  19 - 32 mEq/L Final   Glucose, Bld 01/24/2021 123 (A) 70 - 99 mg/dL Final   BUN 01/24/2021 22  6 - 23 mg/dL Final   Creatinine, Ser 01/24/2021 1.48  0.40 - 1.50 mg/dL Final   GFR 01/24/2021 45.47 (A) >60.00 mL/min Final   Calculated using the CKD-EPI Creatinine Equation (2021)   Calcium 01/24/2021 9.3  8.4 - 10.5 mg/dL Final   Hgb A1c MFr Bld 01/24/2021 7.6 (A) 4.6 - 6.5 % Final   Glycemic Control Guidelines for People with Diabetes:Non Diabetic:  <6%Goal of Therapy: <7%Additional Action Suggested:  >8%      Examination:   BP 140/76   Pulse 65   Ht '5\' 11"'$  (1.803 m)   Wt 234 lb 3.2 oz (106.2 kg)   SpO2 96%   BMI 32.66 kg/m   Body mass index is 32.66 kg/m.      ASSESSMENT/ PLAN:    HYPERTENSION: His blood pressure is relatively higher although better on second measurement Had a good reading with PCP in June and he thinks his blood pressure is higher today because of being late for his medications    Diabetes type 2 with mild obesity:  See history of present illness for detailed discussion of current diabetes management, blood sugar patterns and problems identified  A1c is 7.6 compared to 8%  He is on premixed insulin twice a day started when he had marked hyperglycemia in 09/2018 Also on Janumet XR 100/1000 once a day  Blood sugar control is fair and as before his A1c appears higher than his actual readings No consistent pattern of high sugars by recall  RENAL dysfunction: Stable with now taking 5 mg lisinopril  Hyperuricemia: Currently asymptomatic and with only mildly  increased uric acid we will continue to monitor May need to start allopurinol he has recurrence of gout  Hypopituitarism: He is surprisingly not symptomatic with fatigue or weakness with his history of hypogonadism Appears adequately replaced with normal free T4 on his levothyroxine However not clear why his TSH is higher and may consider increasing levothyroxine if consistently high  PLAN: He will bring his monitor on each visit To get better control lately daytime we will increase morning dose of insulin to at least 22 No change in the evening insulin as yet Encouraged him to continue healthy diet and avoid high carbohydrate meals  Also continue to avoid high-fat foods including red meat He will start monitoring his blood pressure and if he needs a new meter he can purchase one  Patient Instructions  Insulin 22 in am  Get BP monitor     Carl Gutierrez 01/31/2021, 8:45 AM

## 2021-02-18 ENCOUNTER — Other Ambulatory Visit: Payer: Self-pay | Admitting: Endocrinology

## 2021-02-19 ENCOUNTER — Other Ambulatory Visit: Payer: Medicare Other

## 2021-02-19 ENCOUNTER — Other Ambulatory Visit: Payer: Self-pay

## 2021-04-17 ENCOUNTER — Other Ambulatory Visit: Payer: Medicare Other

## 2021-04-19 ENCOUNTER — Other Ambulatory Visit: Payer: Self-pay

## 2021-04-19 ENCOUNTER — Other Ambulatory Visit (INDEPENDENT_AMBULATORY_CARE_PROVIDER_SITE_OTHER): Payer: Medicare Other

## 2021-04-19 DIAGNOSIS — E230 Hypopituitarism: Secondary | ICD-10-CM | POA: Diagnosis not present

## 2021-04-19 DIAGNOSIS — Z794 Long term (current) use of insulin: Secondary | ICD-10-CM

## 2021-04-19 DIAGNOSIS — E1165 Type 2 diabetes mellitus with hyperglycemia: Secondary | ICD-10-CM

## 2021-04-19 LAB — BASIC METABOLIC PANEL
BUN: 21 mg/dL (ref 6–23)
CO2: 26 mEq/L (ref 19–32)
Calcium: 9.3 mg/dL (ref 8.4–10.5)
Chloride: 109 mEq/L (ref 96–112)
Creatinine, Ser: 1.38 mg/dL (ref 0.40–1.50)
GFR: 49.37 mL/min — ABNORMAL LOW (ref >60.00)
Glucose, Bld: 128 mg/dL — ABNORMAL HIGH (ref 70–99)
Potassium: 5.1 mEq/L (ref 3.5–5.1)
Sodium: 142 mEq/L (ref 135–145)

## 2021-04-19 LAB — TSH: TSH: 4.57 u[IU]/mL (ref 0.35–5.50)

## 2021-04-19 LAB — T4, FREE: Free T4: 0.97 ng/dL (ref 0.60–1.60)

## 2021-04-19 LAB — MICROALBUMIN / CREATININE URINE RATIO
Creatinine,U: 95.1 mg/dL
Microalb Creat Ratio: 4.8 mg/g (ref 0.0–30.0)
Microalb, Ur: 4.5 mg/dL — ABNORMAL HIGH (ref 0.0–1.9)

## 2021-04-19 LAB — HEMOGLOBIN A1C: Hgb A1c MFr Bld: 7.6 % — ABNORMAL HIGH (ref 4.6–6.5)

## 2021-04-24 ENCOUNTER — Ambulatory Visit (INDEPENDENT_AMBULATORY_CARE_PROVIDER_SITE_OTHER): Payer: Medicare Other | Admitting: Endocrinology

## 2021-04-24 ENCOUNTER — Encounter: Payer: Self-pay | Admitting: Endocrinology

## 2021-04-24 ENCOUNTER — Other Ambulatory Visit: Payer: Self-pay

## 2021-04-24 VITALS — BP 124/72 | HR 65 | Wt 231.6 lb

## 2021-04-24 DIAGNOSIS — E230 Hypopituitarism: Secondary | ICD-10-CM

## 2021-04-24 DIAGNOSIS — E1165 Type 2 diabetes mellitus with hyperglycemia: Secondary | ICD-10-CM | POA: Diagnosis not present

## 2021-04-24 DIAGNOSIS — I10 Essential (primary) hypertension: Secondary | ICD-10-CM

## 2021-04-24 DIAGNOSIS — Z794 Long term (current) use of insulin: Secondary | ICD-10-CM | POA: Diagnosis not present

## 2021-04-24 MED ORDER — COLCHICINE 0.6 MG PO TABS: ORAL_TABLET | ORAL | 0 refills | Status: DC

## 2021-04-24 NOTE — Patient Instructions (Signed)
Am insulin 24 units  Check blood sugars on waking up 3 days a week  Also check blood sugars about 2 hours after meals and do this after different meals by rotation  Recommended blood sugar levels on waking up are 90-130 and about 2 hours after meal is 130-180  Please bring your blood sugar monitor to each visit, thank you

## 2021-04-24 NOTE — Progress Notes (Signed)
Patient ID: Carl Gutierrez, male   DOB: 12-May-1946, 75 y.o.   MRN: [id number]           Reason for Appointment: Endocrinology follow-up  History of Present Illness   DIABETES type II:  Recent history:     INSULIN regimen: Humalog 75/25 insulin, 22 units before breakfast and 8 before dinner  Non-insulin hypoglycemic drugs: Janumet XR 100/1000 daily  His A1c is 7.6 as before   Current management, blood sugar patterns and problems identified: He did bring his monitor for download today His blood sugars are again being monitored mostly in the morning and only sporadically after meals especially recently He has an occasional high reading for which he thinks may be from occasionally eating fast food sandwich at lunch  Otherwise usually trying to avoid regular soft drinks  Weight is down 3 pounds Fasting readings are generally fairly stable and controlled He usually is consistent with trying to take his insulin before starting to eat He has limited amount of walking because of knee pain, will sometimes do more walking when feeling better No reports of symptoms suggestive of hypoglycemia   Side effects from medications: None      Monitors blood glucose:  Once or twice a day.    Glucometer:  One Touch           Blood Glucose readings from recall:   PRE-MEAL Fasting Lunch Dinner Bedtime Overall  Glucose range:     100-277  Mean/median: 115    147/126   POST-MEAL PC Breakfast PC Lunch PC Dinner  Glucose range:     Mean/median:  170 153   Previously:  PRE-MEAL Fasting Lunch Dinner Bedtime Overall  Glucose range: 120s      Mean/median:        POST-MEAL PC Breakfast PC Lunch PC Dinner  Glucose range:  160-225 130-165  Mean/median:       Dietician visit: Most recent: 11/2012     Weight history   Wt Readings from Last 3 Encounters:  04/24/21 231 lb 9.6 oz (105.1 kg)  01/31/21 234 lb 3.2 oz (106.2 kg)  12/05/20 232 lb 6.4 oz (105.4 kg)           Diabetes labs:  Lab  Results  Component Value Date   HGBA1C 7.6 (H) 04/19/2021   HGBA1C 7.6 (H) 01/24/2021   HGBA1C 8.0 (H) 08/23/2020   Lab Results  Component Value Date   MICROALBUR 4.5 (H) 04/19/2021   LDLCALC 57 12/05/2020   CREATININE 1.38 04/19/2021   Other problems discussed today: See review of systems  Allergies as of 04/24/2021       Reactions   Hydrochlorothiazide    Leg and side pain with this   Other    Blood pressure med name unknown.        Medication List        Accurate as of April 24, 2021  9:24 AM. If you have any questions, ask your nurse or doctor.          amLODipine 10 MG tablet Commonly known as: NORVASC Take 1 tablet (10 mg total) by mouth daily.   aspirin 81 MG tablet Take 81 mg by mouth daily. Reported on 09/05/2015   B-D UF III MINI PEN NEEDLES 31G X 5 MM Misc Generic drug: Insulin Pen Needle USE FOR INSULIN PEN TWICE A DAY   colchicine 0.6 MG tablet 2 tablets at onset of foot pain, take third tablet after 1 hour and then 1  tablet daily with food until pain improved What changed: additional instructions Changed by: Elayne Snare, MD   hydrocortisone 20 MG tablet Commonly known as: CORTEF Take 10-20 mg by mouth 2 (two) times daily. Takes 1 tablet in the morning and 1/2 tablet at night   Insulin Lispro Prot & Lispro (75-25) 100 UNIT/ML Kwikpen Commonly known as: HUMALOG 75/25 MIX INJECT 20 UNITS UNDER THE SKIN BEFORE BREAKFAST AND 6 UNITS BEFORE SUPPER. (REPLACES 70/30)   Janumet XR [phone] MG Tb24 Generic drug: SitaGLIPtin-MetFORMIN HCl TAKE 1 TABLET BY MOUTH EVERY DAY   levothyroxine 88 MCG tablet Commonly known as: SYNTHROID Take 1 tablet (88 mcg total) by mouth daily.   lisinopril 5 MG tablet Commonly known as: ZESTRIL Take 1 tablet (5 mg total) by mouth daily.   metoprolol succinate 25 MG 24 hr tablet Commonly known as: TOPROL-XL Take 1 tablet (25 mg total) by mouth daily.   OneTouch Verio test strip Generic drug: glucose blood USE  AS INSTRUCTED TO CHECK BLOOD SUGAR ONCE DAILY.   rosuvastatin 40 MG tablet Commonly known as: CRESTOR Take 1 tablet (40 mg total) by mouth daily.        Allergies:  Allergies  Allergen Reactions   Hydrochlorothiazide     Leg and side pain with this   Other     Blood pressure med name unknown.    Past Medical History:  Diagnosis Date   CAD S/P percutaneous coronary angioplasty February 2004; 2008   PCI-dLAD: 2.25 mm x 16 mm Express BMS; Ramus- 2.75 mm x 18 mm Cypher DES (2.8 mm); follow-up 11/'08: Patent LAD/ramus stents. 90% small OM. EF 50-60%.; Myoview 1/'12: Exercise 9 min, 10 METS with no ischemia/infarction.    Clotting disorder (HCC)    Diabetes mellitus    Low-dose metformin   DJD (degenerative joint disease), lumbosacral     status post L4-L5 surgery   Essential hypertension    History of DVT of lower extremity     postoperative   Hyperlipidemia with target LDL less than 70    Hypothyroidism    Synthroid   Panhypopituitarism ([place])    Status post pituitary adenoma removal in 2012   Recurrent deep vein thrombosis (DVT) ([place]) 10/01/2015    Past Surgical History:  Procedure Laterality Date   BRAIN SURGERY  2012   Removal of pituitary adenoma   CARDIAC CATHETERIZATION  November 2008   Significant LAD tapering but no significant disease the distal stent. Patent Ramus stent. 90% lesion in small OM 2; small nondominant RCA. Medical therapy. EF 50-60%.   CORONARY ANGIOPLASTY WITH STENT PLACEMENT  February 2004   PCI-dLAD: 2.25 mm x 16 mm BMS;;PCI- RI: 2.75 mm 18 mm Cypher DES   NM MYOVIEW LTD  January 2012   Exercise ~9 min - 10 METS; no ischemia or infarction   PROSTATECTOMY N/A 08/13/2015   Procedure: TRANSVESICLE OPEN PROSTATECTOMY** ;  Surgeon: Carolan Clines, MD;  Location: [facility];  Service: Urology;  Laterality: N/A;   SPINE SURGERY     L4-L5    Family History  Problem Relation Age of Onset   Cancer Mother        pancreatic ca    Social History:   reports that he quit smoking about 27 years ago. His smoking use included cigarettes. He has never used smokeless tobacco. He reports that he does not drink alcohol and does not use drugs.  Review of Systems:  No further recurrence of gout, treated with colchicine in 5/22 Not  on allopurinol but asking for refill of colchicine in case he needs it again  Lab Results  Component Value Date   LABURIC 8.0 (H) 01/24/2021     Hypertension:  He is taking 10 mg amlodipine and lisinopril 10 mg, amlodipine prescribed by his cardiologist  He was advised to get a meter to check his blood pressure at home but has not done so   BP Readings from Last 3 Encounters:  04/24/21 124/72  01/31/21 140/76  12/05/20 128/66   Renal function history:   Creatinine is slightly better He had a high urine microalbumin in 9/22 with his [facility] but normal now  Lab Results  Component Value Date   CREATININE 1.38 04/19/2021   CREATININE 1.48 01/24/2021   CREATININE 1.47 12/05/2020    HYPOPITUITARISM: He has been taking levothyroxine and Cortef as prescribed Takes Cortef 30 mg total as directed in divided doses in the morning and early evening regularly No unusual change in appetite or weakness  Free T4 has been consistently normal with  dosage of 88 mcg levothyroxine, TSH high normal  Lab Results  Component Value Date   TSH 4.57 04/19/2021   TSH 4.64 (H) 12/05/2020   TSH 2.540 07/19/2020   FREET4 0.97 04/19/2021   FREET4 1.00 01/24/2021   FREET4 0.96 10/03/2020    Eye exams: He gets these done at the [facility]   Hypercholesterolemia: Treated with rosuvastatin by PCP  Lab Results  Component Value Date   CHOL 128 12/05/2020   HDL 37.20 (L) 12/05/2020   LDLCALC 57 12/05/2020   LDLDIRECT 85.0 05/18/2014   TRIG 167.0 (H) 12/05/2020   CHOLHDL 3 12/05/2020        LABS:  Lab on 04/19/2021  Component Date Value Ref Range Status   Sodium 04/19/2021 142  135 - 145 mEq/L  Final   Potassium 04/19/2021 5.1  3.5 - 5.1 mEq/L Final   Chloride 04/19/2021 109  96 - 112 mEq/L Final   CO2 04/19/2021 26  19 - 32 mEq/L Final   Glucose, Bld 04/19/2021 128 (A)  70 - 99 mg/dL Final   BUN 04/19/2021 21  6 - 23 mg/dL Final   Creatinine, Ser 04/19/2021 1.38  0.40 - 1.50 mg/dL Final   GFR 04/19/2021 49.37 (A)  >60.00 mL/min Final   Calculated using the CKD-EPI Creatinine Equation (2021)   Calcium 04/19/2021 9.3  8.4 - 10.5 mg/dL Final   Microalb, Ur 04/19/2021 4.5 (A)  0.0 - 1.9 mg/dL Final   Creatinine,U 04/19/2021 95.1  mg/dL Final   Microalb Creat Ratio 04/19/2021 4.8  0.0 - 30.0 mg/g Final   Free T4 04/19/2021 0.97  0.60 - 1.60 ng/dL Final   Comment: Specimens from patients who are undergoing biotin therapy and /or ingesting biotin supplements may contain high levels of biotin.  The higher biotin concentration in these specimens interferes with this Free T4 assay.  Specimens that contain high levels  of biotin may cause false high results for this Free T4 assay.  Please interpret results in light of the total clinical presentation of the patient.     TSH 04/19/2021 4.57  0.35 - 5.50 uIU/mL Final   Hgb A1c MFr Bld 04/19/2021 7.6 (A)  4.6 - 6.5 % Final   Glycemic Control Guidelines for People with Diabetes:Non Diabetic:  <6%Goal of Therapy: <7%Additional Action Suggested:  >8%      Examination:   BP 124/72 (Patient Position: Standing)   Pulse 65   Wt 231 lb 9.6  oz (105.1 kg)   SpO2 95%   BMI 32.30 kg/m   Body mass index is 32.3 kg/m.      ASSESSMENT/ PLAN:    Diabetes type 2 with mild obesity:   See history of present illness for detailed discussion of current diabetes management, blood sugar patterns and problems identified  A1c is 7.6 again  He is on premixed insulin twice a day and Janumet XR  Although his blood sugars are usually controlled overnight he likely has higher readings during the day when he measures them, not checking postprandial as  much Blood sugars usually appear to be fairly good after dinner with only 4 readings in the last month  He is not able to lose weight consistently and not able to exercise much  RENAL dysfunction: Likely improved, taking 5 mg lisinopril which had been reduced No microalbuminuria Potassium is high normal but it is not typical  Hyperuricemia: No further episodes of gout  Hypopituitarism: He has history of hypogonadism, this has not been treated because of lack of symptoms and patient not wanting any treatment  Secondary hypothyroidism: No fatigue, adequately replaced with normal free T4 on his levothyroxine TSH high normal before   PLAN: He will increase morning insulin to 24 units  Encouraged him to keep cutting back on calorie intake and exercising as is possible for weight loss  More frequent monitoring after lunch and breakfast and call if consistently high No change in evening dose Continue amlodipine for blood pressure If his potassium is relatively higher may need to consider switching lisinopril to losartan He will get a home monitor to check his blood pressure and call if blood pressure consistently high  Patient Instructions  Am insulin 24 units  Check blood sugars on waking up 3 days a week  Also check blood sugars about 2 hours after meals and do this after different meals by rotation  Recommended blood sugar levels on waking up are 90-130 and about 2 hours after meal is 130-180  Please bring your blood sugar monitor to each visit, thank you     Elayne Snare 04/24/2021, 9:24 AM

## 2021-04-28 ENCOUNTER — Other Ambulatory Visit: Payer: Self-pay | Admitting: Endocrinology

## 2021-04-29 ENCOUNTER — Other Ambulatory Visit: Payer: Self-pay | Admitting: Endocrinology

## 2021-05-30 ENCOUNTER — Other Ambulatory Visit: Payer: Self-pay | Admitting: Endocrinology

## 2021-06-01 ENCOUNTER — Other Ambulatory Visit: Payer: Self-pay | Admitting: Family Medicine

## 2021-06-01 DIAGNOSIS — E785 Hyperlipidemia, unspecified: Secondary | ICD-10-CM

## 2021-06-19 ENCOUNTER — Ambulatory Visit (INDEPENDENT_AMBULATORY_CARE_PROVIDER_SITE_OTHER): Payer: Medicare Other | Admitting: Family Medicine

## 2021-06-19 ENCOUNTER — Encounter: Payer: Self-pay | Admitting: Family Medicine

## 2021-06-19 VITALS — BP 126/78 | HR 77 | Temp 98.0°F | Resp 16 | Ht 71.0 in | Wt 233.2 lb

## 2021-06-19 DIAGNOSIS — E039 Hypothyroidism, unspecified: Secondary | ICD-10-CM | POA: Diagnosis not present

## 2021-06-19 DIAGNOSIS — E785 Hyperlipidemia, unspecified: Secondary | ICD-10-CM | POA: Diagnosis not present

## 2021-06-19 DIAGNOSIS — E1165 Type 2 diabetes mellitus with hyperglycemia: Secondary | ICD-10-CM

## 2021-06-19 DIAGNOSIS — I10 Essential (primary) hypertension: Secondary | ICD-10-CM

## 2021-06-19 DIAGNOSIS — R519 Headache, unspecified: Secondary | ICD-10-CM | POA: Diagnosis not present

## 2021-06-19 DIAGNOSIS — Z794 Long term (current) use of insulin: Secondary | ICD-10-CM

## 2021-06-19 DIAGNOSIS — N1831 Chronic kidney disease, stage 3a: Secondary | ICD-10-CM

## 2021-06-19 DIAGNOSIS — Z7185 Encounter for immunization safety counseling: Secondary | ICD-10-CM

## 2021-06-19 DIAGNOSIS — M109 Gout, unspecified: Secondary | ICD-10-CM

## 2021-06-19 LAB — COMPREHENSIVE METABOLIC PANEL
ALT: 15 U/L (ref 0–53)
AST: 13 U/L (ref 0–37)
Albumin: 4.1 g/dL (ref 3.5–5.2)
Alkaline Phosphatase: 75 U/L (ref 39–117)
BUN: 22 mg/dL (ref 6–23)
CO2: 24 mEq/L (ref 19–32)
Calcium: 9.2 mg/dL (ref 8.4–10.5)
Chloride: 106 mEq/L (ref 96–112)
Creatinine, Ser: 1.49 mg/dL (ref 0.40–1.50)
GFR: 44.97 mL/min — ABNORMAL LOW (ref >60.00)
Glucose, Bld: 158 mg/dL — ABNORMAL HIGH (ref 70–99)
Potassium: 4.4 mEq/L (ref 3.5–5.1)
Sodium: 139 mEq/L (ref 135–145)
Total Bilirubin: 0.3 mg/dL (ref 0.2–1.2)
Total Protein: 7.1 g/dL (ref 6.0–8.3)

## 2021-06-19 LAB — LIPID PANEL
Cholesterol: 121 mg/dL (ref 0–200)
HDL: 33.9 mg/dL — ABNORMAL LOW (ref >39.00)
LDL Cholesterol: 64 mg/dL (ref 0–99)
NonHDL: 87.15
Total CHOL/HDL Ratio: 4
Triglycerides: 118 mg/dL (ref 0.0–149.0)
VLDL: 23.6 mg/dL (ref 0.0–40.0)

## 2021-06-19 MED ORDER — AMLODIPINE BESYLATE 10 MG PO TABS: 10.0000 mg | ORAL_TABLET | Freq: Every day | ORAL | 1 refills | Status: DC

## 2021-06-19 MED ORDER — LEVOTHYROXINE SODIUM 88 MCG PO TABS: 88.0000 ug | ORAL_TABLET | Freq: Every day | ORAL | 1 refills | Status: DC

## 2021-06-19 MED ORDER — METOPROLOL SUCCINATE ER 25 MG PO TB24: 25.0000 mg | ORAL_TABLET | Freq: Every day | ORAL | 1 refills | Status: DC

## 2021-06-19 NOTE — Patient Instructions (Addendum)
Thanks for coming in today.  Drink plenty of water throughout the day. If headaches continue or change be seen right away.  No med changes for now.  If any gout flare, return for labs and to discuss meds.  I recommend Covid Bivalent booster if you have not received it.  Take care.

## 2021-06-19 NOTE — Progress Notes (Signed)
Subjective:  Patient ID: Carl Gutierrez, male    DOB: [birth year]  Age: 76 y.o. MRN: [id number]  CC:  Chief Complaint  Patient presents with   Hypothyroidism    Pt here for recheck, doing well.    Hyperlipidemia    Pt here for recheck no concerns    Hypertension    Pt reports doing well has occasional headaches but will usually go away, no other sxs     HPI Carl Gutierrez presents for   Diabetes: With hypopituitarism, treated by endocrinology Dr. Dwyane Dee.  Office visit November, A1c stable.  Treated with Humalog, Janumet.  Insulin was increased to 24 units, diet/exercise changes discussed. No symptomatic lows. No changes to diet/exercise.  Lab Results  Component Value Date   HGBA1C 7.6 (H) 04/19/2021   HGBA1C 7.6 (H) 01/24/2021   HGBA1C 8.0 (H) 08/23/2020   Lab Results  Component Value Date   MICROALBUR 4.5 (H) 04/19/2021   LDLCALC 57 12/05/2020   CREATININE 1.38 04/19/2021    Hypertension: With stage IIIa CKD, CAD status post angioplasty 2004, 2008.  Cardiology Dr. Ellyn Hack, appointment in February.  Continued on beta-blocker, amlodipine, ACE inhibitor, aspirin and his statin. No new med side effects. Rare HA, frontal few seconds then resolves, mild, no temporal HA, jaw pain or vision changes. 7 times per month. Fleeting, no change in sx's.  Home readings: unsure No nsaids.  BP Readings from Last 3 Encounters:  06/19/21 126/78  04/24/21 124/72  01/31/21 140/76   Lab Results  Component Value Date   CREATININE 1.38 04/19/2021   Hyperlipidemia: Crestor 40 mg daily. No new side effects or myalgias Lab Results  Component Value Date   CHOL 128 12/05/2020   HDL 37.20 (L) 12/05/2020   LDLCALC 57 12/05/2020   LDLDIRECT 85.0 05/18/2014   TRIG 167.0 (H) 12/05/2020   CHOLHDL 3 12/05/2020   Lab Results  Component Value Date   ALT 15 12/05/2020   AST 13 12/05/2020   ALKPHOS 71 12/05/2020   BILITOT 0.3 12/05/2020   Hypothyroidism: Lab Results  Component Value Date    TSH 4.57 04/19/2021  Taking medication daily.  Synthroid 88 mcg daily. No new hot or cold intolerance. No new hair or skin changes, heart palpitations or new fatigue. No new weight changes.   Gout: Last flare:none recently. Adjusted diet. Avoiding triggers.  Daily meds:none Prn med: colchicine.  Lab Results  Component Value Date   LABURIC 8.0 (H) 01/24/2021    HM:  Flu vaccine at [facility].  Covid booster - 09/28/20, unsure if had bivalent - recommended.   History Patient Active Problem List   Diagnosis Date Noted   Gout of big toe 10/31/2020   Stage 3a chronic kidney disease ([place]) 01/04/2020   Anemia in chronic kidney disease 11/30/2015   Recurrent deep vein thrombosis (DVT) ([place]) 10/01/2015   Hematuria, unspecified 10/01/2015   Elevated serum creatinine 10/01/2015   BPH (benign prostatic hyperplasia) 08/13/2015   Benign localized hyperplasia of prostate with urinary obstruction 07/23/2015   Preop cardiovascular exam 07/17/2015   Encounter for CDL (commercial driving license) exam 29/93/7169   Obesity (BMI 30-39.9) 06/26/2013   Essential hypertension 06/26/2013   Panhypopituitarism ([place]) 11/17/2011     11/17/2011   Diabetes mellitus ([place]) 11/17/2011   Hyperlipidemia associated with type 2 diabetes mellitus ([place]) 11/17/2011   CAD S/P percutaneous coronary angioplasty 07/19/2002   Past Medical History:  Diagnosis Date   CAD S/P percutaneous coronary angioplasty February 2004; 2008   PCI-dLAD: 2.25 mm  x 16 mm Express BMS; Ramus- 2.75 mm x 18 mm Cypher DES (2.8 mm); follow-up 11/'08: Patent LAD/ramus stents. 90% small OM. EF 50-60%.; Myoview 1/'12: Exercise 9 min, 10 METS with no ischemia/infarction.    Clotting disorder (HCC)    Diabetes mellitus    Low-dose metformin   DJD (degenerative joint disease), lumbosacral     status post L4-L5 surgery   Essential hypertension    History of DVT of lower extremity     postoperative   Hyperlipidemia with target LDL less than 70     Hypothyroidism    Synthroid   Panhypopituitarism ([place])    Status post pituitary adenoma removal in 2012   Recurrent deep vein thrombosis (DVT) ([place]) 10/01/2015   Past Surgical History:  Procedure Laterality Date   BRAIN SURGERY  2012   Removal of pituitary adenoma   CARDIAC CATHETERIZATION  November 2008   Significant LAD tapering but no significant disease the distal stent. Patent Ramus stent. 90% lesion in small OM 2; small nondominant RCA. Medical therapy. EF 50-60%.   CORONARY ANGIOPLASTY WITH STENT PLACEMENT  February 2004   PCI-dLAD: 2.25 mm x 16 mm BMS;;PCI- RI: 2.75 mm 18 mm Cypher DES   NM MYOVIEW LTD  January 2012   Exercise ~9 min - 10 METS; no ischemia or infarction   PROSTATECTOMY N/A 08/13/2015   Procedure: TRANSVESICLE OPEN PROSTATECTOMY** ;  Surgeon: Carolan Clines, MD;  Location: [facility];  Service: Urology;  Laterality: N/A;   SPINE SURGERY     L4-L5   Allergies  Allergen Reactions   Hydrochlorothiazide     Leg and side pain with this   Other     Blood pressure med name unknown.   Prior to Admission medications   Medication Sig Start Date End Date Taking? Authorizing Provider  amLODipine (NORVASC) 10 MG tablet Take 1 tablet (10 mg total) by mouth daily. 12/05/20  Yes Wendie Agreste, MD  aspirin 81 MG tablet Take 81 mg by mouth daily. Reported on 09/05/2015   Yes [provider]  B-D UF III MINI PEN NEEDLES 31G X 5 MM MISC USE FOR INSULIN PEN TWICE A DAY 04/29/21  Yes Elayne Snare, MD  colchicine 0.6 MG tablet 2 tablets at onset of foot pain, take third tablet after 1 hour and then 1 tablet daily with food until pain improved 04/24/21  Yes Elayne Snare, MD  hydrocortisone (CORTEF) 20 MG tablet Take 10-20 mg by mouth 2 (two) times daily. Takes 1 tablet in the morning and 1/2 tablet at night   Yes [provider]  Insulin Lispro Prot & Lispro (HUMALOG 75/25 MIX) (75-25) 100 UNIT/ML Kwikpen INJECT 20 UNITS UNDER THE SKIN BEFORE BREAKFAST AND 6 UNITS  BEFORE SUPPER. (REPLACES 70/30) 04/29/21  Yes Elayne Snare, MD  JANUMET XR [phone] MG TB24 TAKE 1 TABLET BY MOUTH EVERY DAY 05/30/21  Yes Elayne Snare, MD  levothyroxine (SYNTHROID) 88 MCG tablet Take 1 tablet (88 mcg total) by mouth daily. 12/05/20  Yes Wendie Agreste, MD  lisinopril (ZESTRIL) 5 MG tablet Take 1 tablet (5 mg total) by mouth daily. 10/31/20  Yes Elayne Snare, MD  metoprolol succinate (TOPROL-XL) 25 MG 24 hr tablet Take 1 tablet (25 mg total) by mouth daily. 12/05/20  Yes Wendie Agreste, MD  ONETOUCH VERIO test strip USE AS INSTRUCTED TO CHECK BLOOD SUGAR ONCE DAILY. 11/23/20  Yes Elayne Snare, MD  rosuvastatin (CRESTOR) 40 MG tablet TAKE 1 TABLET BY MOUTH EVERY DAY 06/02/21  Yes Wendie Agreste, MD  testosterone cypionate (DEPO-TESTOSTERONE) 200 MG/ML injection Inject 1.5 mLs (300 mg total) into the muscle every 21 ( twenty-one) days. 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 Exercises only occasionally. Not as much as he desires 2. 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   He does not smoke cigarettes -- quit in 1995. Does not drink alcohol.   Social Determinants of Health   Financial Resource Strain: Not on file  Food Insecurity: Not on file  Transportation Needs: Not on file  Physical  Activity: Not on file  Stress: Not on file  Social Connections: Not on file  Intimate Partner Violence: Not on file    Review of Systems  Constitutional:  Negative for fatigue and unexpected weight change.  Eyes:  Negative for visual disturbance.  Respiratory:  Negative for cough, chest tightness and shortness of breath.   Cardiovascular:  Negative for chest pain, palpitations and leg swelling.  Gastrointestinal:  Negative for abdominal pain and blood in stool.  Neurological:  Positive for headaches. Negative for dizziness and light-headedness.    Objective:   Vitals:   06/19/21 0756  BP: 126/78  Pulse: 77  Resp: 16  Temp: 98 F (36.7 C)  TempSrc: Temporal  SpO2: 100%  Weight: 233 lb 3.2 oz (105.8 kg)  Height: 5\' 11"  (1.803 m)     Physical Exam Vitals reviewed.  Constitutional:      Appearance: He is well-developed.  HENT:     Head: Normocephalic and atraumatic.  Neck:     Vascular: No carotid bruit or JVD.  Cardiovascular:     Rate and Rhythm: Normal rate and regular rhythm.     Heart sounds: Normal heart sounds. No murmur heard. Pulmonary:     Effort: Pulmonary effort is normal.     Breath sounds: Normal breath sounds. No rales.  Musculoskeletal:     Right lower leg: No edema.     Left lower leg: No edema.  Skin:    General: Skin is warm and dry.  Neurological:     General: No focal deficit present.     Mental Status: He is alert and oriented to person, place, and time.  Psychiatric:        Mood and Affect: Mood normal.       Assessment & Plan:  Carl Gutierrez is a 76 y.o. male . Essential hypertension - Plan: metoprolol succinate (TOPROL-XL) 25 MG 24 hr tablet, amLODipine (NORVASC) 10 MG tablet, Comprehensive metabolic panel  -  Stable, tolerating current regimen. Medications refilled. Labs pending as above.   Nonintractable episodic headache,  unspecified headache type  -New concern.  Nonfocal exam, fleeting short duration headaches without  associated concerning symptoms/red flags.  Denies change in frequency.  With brief, mild nature we will continue to monitor, try to increase fluids, RTC precautions if persistent or any changes/worsening.  Hypothyroidism, unspecified type - Plan: levothyroxine (SYNTHROID) 88 MCG tablet, Comprehensive metabolic panel  -Managed by endocrinology, continue Synthroid.  Hyperlipidemia, unspecified hyperlipidemia type - Plan: Comprehensive metabolic panel, Lipid panel  -Tolerating statin, continue same, updated labs ordered.  Gout of big toe  -Denies recent recurrence of gout.  Prior uric acid elevated.  Would consider daily med if recurrence and persistent elevated uric acid.  RTC precautions  Type 2 diabetes mellitus with hyperglycemia, with long-term current use of insulin (HCC) Stage 3a chronic kidney disease ([place])  -Diabetes followed by endocrinology, echoed recommendation of diet/exercise changes in addition to his recent medication change  Vaccine counseling Bivalent booster recommended if he has not yet received it.  Meds ordered this encounter  Medications   levothyroxine (SYNTHROID) 88 MCG tablet    Sig: Take 1 tablet (88 mcg total) by mouth daily.    Dispense:  90 tablet    Refill:  1   metoprolol succinate (TOPROL-XL) 25 MG 24 hr tablet    Sig: Take 1 tablet (25 mg total) by mouth daily.    Dispense:  90 tablet    Refill:  1   amLODipine (NORVASC) 10 MG tablet    Sig: Take 1 tablet (10 mg total) by mouth daily.    Dispense:  90 tablet    Refill:  1   Patient Instructions  Thanks for coming in today.  Drink plenty of water throughout the day. If headaches continue or change be seen right away.  No med changes for now.  If any gout flare, return for labs and to discuss meds.  I recommend Covid Bivalent booster if you have not received it.  Take care.     Signed,   [name], MD [place] 06/19/21 8:35  AM

## 2021-07-01 ENCOUNTER — Other Ambulatory Visit: Payer: Self-pay

## 2021-07-01 ENCOUNTER — Encounter: Payer: Self-pay | Admitting: Cardiology

## 2021-07-01 ENCOUNTER — Telehealth: Payer: Self-pay | Admitting: Cardiology

## 2021-07-01 ENCOUNTER — Encounter: Payer: Self-pay | Admitting: Emergency Medicine

## 2021-07-01 ENCOUNTER — Emergency Department (INDEPENDENT_AMBULATORY_CARE_PROVIDER_SITE_OTHER)
Admission: EM | Admit: 2021-07-01 | Discharge: 2021-07-01 | Disposition: A | Discharge status: Home / Self Care | Payer: Medicare Other | Source: Home / Self Care

## 2021-07-01 ENCOUNTER — Ambulatory Visit (INDEPENDENT_AMBULATORY_CARE_PROVIDER_SITE_OTHER): Payer: Medicare Other | Admitting: Cardiology

## 2021-07-01 VITALS — BP 110/58 | HR 72 | Ht 71.0 in | Wt 230.4 lb

## 2021-07-01 DIAGNOSIS — R079 Chest pain, unspecified: Secondary | ICD-10-CM | POA: Diagnosis not present

## 2021-07-01 DIAGNOSIS — I25118 Atherosclerotic heart disease of native coronary artery with other forms of angina pectoris: Secondary | ICD-10-CM | POA: Diagnosis not present

## 2021-07-01 DIAGNOSIS — E669 Obesity, unspecified: Secondary | ICD-10-CM | POA: Diagnosis not present

## 2021-07-01 DIAGNOSIS — E785 Hyperlipidemia, unspecified: Secondary | ICD-10-CM

## 2021-07-01 DIAGNOSIS — I10 Essential (primary) hypertension: Secondary | ICD-10-CM | POA: Diagnosis not present

## 2021-07-01 DIAGNOSIS — E1169 Type 2 diabetes mellitus with other specified complication: Secondary | ICD-10-CM

## 2021-07-01 DIAGNOSIS — E118 Type 2 diabetes mellitus with unspecified complications: Secondary | ICD-10-CM

## 2021-07-01 MED ORDER — NITROGLYCERIN 0.4 MG SL SUBL: 0.4000 mg | SUBLINGUAL_TABLET | SUBLINGUAL | 3 refills | Status: AC | PRN

## 2021-07-01 NOTE — ED Triage Notes (Signed)
Pt c/o left sided chest pain that's started Friday. Intermittent dull pain.

## 2021-07-01 NOTE — Patient Instructions (Signed)
Medication Instructions:  Continue same medications *If you need a refill on your cardiac medications before your next appointment, please call your pharmacy*   Lab Work: None ordered   Testing/Procedures: Schedule Lexiscan myoview   Follow-Up: At [facility], you and your health needs are our priority.  As part of our continuing mission to provide you with exceptional heart care, we have created designated Provider Care Teams.  These Care Teams include your primary Cardiologist (physician) and Advanced Practice Providers (APPs -  Physician Assistants and Nurse Practitioners) who all work together to provide you with the care you need, when you need it.  We recommend signing up for the patient portal called "MyChart".  Sign up information is provided on this After Visit Summary.  MyChart is used to connect with patients for Virtual Visits (Telemedicine).  Patients are able to view lab/test results, encounter notes, upcoming appointments, etc.  Non-urgent messages can be sent to your provider as well.   To learn more about what you can do with MyChart, go to [email or web address].      Your next appointment:  Wednesday 08/07/21 at 10:20 am    The format for your next appointment: Office   Provider:  Dr.Harding

## 2021-07-01 NOTE — Progress Notes (Signed)
Cardiology Office Note   Date:  07/01/2021   ID:  Carl Gutierrez, DOB 06-23-1945, MRN [id number]  PCP:  Wendie Agreste, MD  Cardiologist:  Ula Lingo MD  Chief Complaint  Patient presents with   Coronary Artery Disease   Chest Pain      History of Present Illness: Carl Gutierrez is a 76 y.o. male patient of Dr Ellyn Hack who is seen as a work in for evaluation of chest pain. He has a history of CAD s/p PCI of 2 vessels in 2004. This included a 2.75 x 16 mm Cypher stent in the ramus intermediate and a 2.25 x 16 Express 2 stent in the distal LAD by Dr Melvern Banker. Cardiac cath in 2008 showed patent stents.  He has a history of HTN, HLD, DM and obesity. Last Myoview study in 2017 was low risk. Inferior wall defect similar to prior Myoview report in 2012.    The patient states he has experienced pain in the left axilla over the past 3-4 days. It is dull ache and comes and goes. Lasts 10-15 minutes. No aggravating factors. May awaken him from sleep. Hasn't taken Ntg since his are 41-44 years old. Thinks his symptoms are similar to when he had stents placed.     Past Medical History:  Diagnosis Date   CAD S/P percutaneous coronary angioplasty February 2004; 2008   PCI-dLAD: 2.25 mm x 16 mm Express BMS; Ramus- 2.75 mm x 18 mm Cypher DES (2.8 mm); follow-up 11/'08: Patent LAD/ramus stents. 90% small OM. EF 50-60%.; Myoview 1/'12: Exercise 9 min, 10 METS with no ischemia/infarction.    Clotting disorder (HCC)    Diabetes mellitus    Low-dose metformin   DJD (degenerative joint disease), lumbosacral     status post L4-L5 surgery   Essential hypertension    History of DVT of lower extremity     postoperative   Hyperlipidemia with target LDL less than 70    Hypothyroidism    Synthroid   Panhypopituitarism ([place])    Status post pituitary adenoma removal in 2012   Recurrent deep vein thrombosis (DVT) ([place]) 10/01/2015    Past Surgical History:  Procedure Laterality Date   BRAIN SURGERY  2012    Removal of pituitary adenoma   CARDIAC CATHETERIZATION  November 2008   Significant LAD tapering but no significant disease the distal stent. Patent Ramus stent. 90% lesion in small OM 2; small nondominant RCA. Medical therapy. EF 50-60%.   CORONARY ANGIOPLASTY WITH STENT PLACEMENT  February 2004   PCI-dLAD: 2.25 mm x 16 mm BMS;;PCI- RI: 2.75 mm 18 mm Cypher DES   NM MYOVIEW LTD  January 2012   Exercise ~9 min - 10 METS; no ischemia or infarction   PROSTATECTOMY N/A 08/13/2015   Procedure: TRANSVESICLE OPEN PROSTATECTOMY** ;  Surgeon: Carolan Clines, MD;  Location: [facility];  Service: Urology;  Laterality: N/A;   SPINE SURGERY     L4-L5     Current Outpatient Medications  Medication Sig Dispense Refill   amLODipine (NORVASC) 10 MG tablet Take 1 tablet (10 mg total) by mouth daily. 90 tablet 1   aspirin 81 MG tablet Take 81 mg by mouth daily. Reported on 09/05/2015     B-D UF III MINI PEN NEEDLES 31G X 5 MM MISC USE FOR INSULIN PEN TWICE A DAY 100 each 7   colchicine 0.6 MG tablet 2 tablets at onset of foot pain, take third tablet after 1 hour and then 1 tablet daily  with food until pain improved 10 tablet 0   hydrocortisone (CORTEF) 20 MG tablet Take 10-20 mg by mouth 2 (two) times daily. Takes 1 tablet in the morning and 1/2 tablet at night     Insulin Lispro Prot & Lispro (HUMALOG 75/25 MIX) (75-25) 100 UNIT/ML Kwikpen INJECT 20 UNITS UNDER THE SKIN BEFORE BREAKFAST AND 6 UNITS BEFORE SUPPER. (REPLACES 70/30) 15 mL 2   JANUMET XR [phone] MG TB24 TAKE 1 TABLET BY MOUTH EVERY DAY 30 tablet 3   levothyroxine (SYNTHROID) 88 MCG tablet Take 1 tablet (88 mcg total) by mouth daily. 90 tablet 1   lisinopril (ZESTRIL) 5 MG tablet Take 1 tablet (5 mg total) by mouth daily. 90 tablet 3   metoprolol succinate (TOPROL-XL) 25 MG 24 hr tablet Take 1 tablet (25 mg total) by mouth daily. 90 tablet 1   nitroGLYCERIN (NITROSTAT) 0.4 MG SL tablet Place 1 tablet (0.4 mg total) under the tongue every 5  (five) minutes as needed for chest pain. 90 tablet 3   ONETOUCH VERIO test strip USE AS INSTRUCTED TO CHECK BLOOD SUGAR ONCE DAILY. 100 strip 3   rosuvastatin (CRESTOR) 40 MG tablet TAKE 1 TABLET BY MOUTH EVERY DAY 90 tablet 1   No current facility-administered medications for this visit.    Allergies:   Hydrochlorothiazide and Other    Social History:  The patient  reports that he quit smoking about 28 years ago. His smoking use included cigarettes. He has never used smokeless tobacco. He reports that he does not drink alcohol and does not use drugs.   Family History:  The patient's family history includes Cancer in his mother.    ROS:  Please see the history of present illness.   Otherwise, review of systems are positive for none.   All other systems are reviewed and negative.    PHYSICAL EXAM: VS:  BP (!) 110/58    Pulse 72    Ht 5\' 11"  (1.803 m)    Wt 230 lb 6.4 oz (104.5 kg)    SpO2 98%    BMI 32.13 kg/m  , BMI Body mass index is 32.13 kg/m. GEN: Well nourished, well developed, in no acute distress HEENT: normal Neck: no JVD, carotid bruits, or masses Cardiac: RRR; no murmurs, rubs, or gallops,no edema. No pain to palpation in the left axilla.  Respiratory:  clear to auscultation bilaterally, normal work of breathing GI: soft, nontender, nondistended, + BS MS: no deformity or atrophy Skin: warm and dry, no rash Neuro:  Strength and sensation are intact Psych: euthymic mood, full affect   EKG:  EKG is ordered today. The ekg ordered today demonstrates NSR rate 64, Low voltage limb leads. Otherwise normal. I have personally reviewed and interpreted this study.    Recent Labs: 04/19/2021: TSH 4.57 06/19/2021: ALT 15; BUN 22; Creatinine, Ser 1.49; Potassium 4.4; Sodium 139    Lipid Panel    Component Value Date/Time   CHOL 121 06/19/2021 0842   CHOL 130 05/28/2020 0931   TRIG 118.0 06/19/2021 0842   HDL 33.90 (L) 06/19/2021 0842   HDL 40 05/28/2020 0931   CHOLHDL 4  06/19/2021 0842   VLDL 23.6 06/19/2021 0842   LDLCALC 64 06/19/2021 0842   LDLCALC 66 05/28/2020 0931   LDLDIRECT 85.0 05/18/2014 1523      Wt Readings from Last 3 Encounters:  07/01/21 230 lb 6.4 oz (104.5 kg)  07/01/21 230 lb (104.3 kg)  06/19/21 233 lb 3.2 oz (105.8 kg)  Other studies Reviewed: Additional studies/ records that were reviewed today include:   Myoview 07/19/15: Study Highlights  The left ventricular ejection fraction is mildly decreased (45-54%). Nuclear stress EF: 52%. There was no ST segment deviation noted during stress. This is a low risk study.   Low risk stress nuclear study with a medium size, moderate intensity, partially reversible inferior defect; findings consistent with small prior infarct vs diaphragmatic attenuation; mild inferior ischemia; EF 52 with normal wall motion.     ASSESSMENT AND PLAN:  1.  CAD with remote stenting of the LAD and ramus intermediate in 2004. Now with atypical left axillary pain but patient states similar to prior angina. Ecg is normal. Recommend lexiscan Myoview to assess and compare with 2017. Refilled Rx for sl Ntg 2. HTN controlled 3. HLD on statin. LDL at goal. 4. DM type 2 with CKD 5. CKD stage 3a with DM   Current medicines are reviewed at length with the patient today.  The patient does not have concerns regarding medicines.  The following changes have been made:  no change  Labs/ tests ordered today include:   Orders Placed This Encounter  Procedures   Cardiac Stress Test: Informed Consent Details: Physician/Practitioner Attestation; Transcribe to consent form and obtain patient signature         Disposition:   FU with Dr Ellyn Hack as scheduled next month.   Signed, Eura Mccauslin [place], MD  07/01/2021 2:08 PM    [facility] [address], [place], [place], [address] Phone [phone], Fax [phone]

## 2021-07-01 NOTE — Telephone Encounter (Signed)
Spoke with pt, he reports chest pain that started a couple days ago. It is located on the left side of his chest under his arm. He reports it is a 5 on a pain scale 1-10. It can last from 30 minutes to 10 min and can come on with rest or exertion. He also reports it comes and goes during the night when lying down. He denies SOB or other symptoms. He reports having this problem in the past and he had to have stents. His last episode was this morning at 8 am. He does not have NTG. He is currently pain free. Follow up scheduled with DOD, Dr [place], today at 2 pm. Patient voiced understanding that he can not come into the office with active chest pain and if he were to develop pain again to call 911 and go to the ER.

## 2021-07-01 NOTE — Discharge Instructions (Addendum)
You have an appointment with Dr. [place] at 2:00 today.  It is important that you keep this appointment  If you become worse instead of better prior to the appointment, with increasing chest pain or shortness of breath you need to go directly to an emergency room or call 911  If you arrive at the cardiology office having chest pain, you will be redirected to the emergency department

## 2021-07-01 NOTE — ED Provider Notes (Addendum)
[facility]    CSN: [id number] Arrival date & time: 07/01/21  1208      History   Chief Complaint Chief Complaint  Patient presents with   Chest Pain    HPI Carl Gutierrez is a 76 y.o. male.   HPI  Patient is a 76 year old gentleman with diabetes, hypertension, hyperlipidemia and known coronary artery disease who has had angioplasty on 2 different occasions with stents.  He has been having chest pain intermittently over the weekend.  He states its in his left chest and it is a dull pain.  It is at rest and with exertion.  It can last from a few minutes to 30 minutes.  Denies dizziness, palpitations, shortness of breath or recent pedal edema.  No change in medications.  He thinks the pain is random.  He states he cannot bring the pain on He called his cardiologist this morning.  They made him an appointment for 2:00 today.  They advised him to take nitro if he has it.  He does not.  His wife was unhappy with having to wait until 2:00 so she brought him here to the urgent care center. He has had no heartburn, gas, or stomach problems He has had no recent cough or congestion He denies any heavy lifting or strained his muscles  Past Medical History:  Diagnosis Date   CAD S/P percutaneous coronary angioplasty February 2004; 2008   PCI-dLAD: 2.25 mm x 16 mm Express BMS; Ramus- 2.75 mm x 18 mm Cypher DES (2.8 mm); follow-up 11/'08: Patent LAD/ramus stents. 90% small OM. EF 50-60%.; Myoview 1/'12: Exercise 9 min, 10 METS with no ischemia/infarction.    Clotting disorder (HCC)    Diabetes mellitus    Low-dose metformin   DJD (degenerative joint disease), lumbosacral     status post L4-L5 surgery   Essential hypertension    History of DVT of lower extremity     postoperative   Hyperlipidemia with target LDL less than 70    Hypothyroidism    Synthroid   Panhypopituitarism ([place])    Status post pituitary adenoma removal in 2012   Recurrent deep vein thrombosis (DVT) ([place])  10/01/2015    Patient Active Problem List   Diagnosis Date Noted   Gout of big toe 10/31/2020   Stage 3a chronic kidney disease ([place]) 01/04/2020   Anemia in chronic kidney disease 11/30/2015   Recurrent deep vein thrombosis (DVT) ([place]) 10/01/2015   Hematuria, unspecified 10/01/2015   Elevated serum creatinine 10/01/2015   BPH (benign prostatic hyperplasia) 08/13/2015   Benign localized hyperplasia of prostate with urinary obstruction 07/23/2015   Preop cardiovascular exam 07/17/2015   Encounter for CDL (commercial driving license) exam 52/84/1324   Obesity (BMI 30-39.9) 06/26/2013   Essential hypertension 06/26/2013   Panhypopituitarism ([place]) 11/17/2011     11/17/2011   Diabetes mellitus ([place]) 11/17/2011   Hyperlipidemia associated with type 2 diabetes mellitus ([place]) 11/17/2011   CAD S/P percutaneous coronary angioplasty 07/19/2002    Past Surgical History:  Procedure Laterality Date   BRAIN SURGERY  2012   Removal of pituitary adenoma   CARDIAC CATHETERIZATION  November 2008   Significant LAD tapering but no significant disease the distal stent. Patent Ramus stent. 90% lesion in small OM 2; small nondominant RCA. Medical therapy. EF 50-60%.   CORONARY ANGIOPLASTY WITH STENT PLACEMENT  February 2004   PCI-dLAD: 2.25 mm x 16 mm BMS;;PCI- RI: 2.75 mm 18 mm Cypher DES   NM MYOVIEW LTD  January  2012   Exercise ~9 min - 10 METS; no ischemia or infarction   PROSTATECTOMY N/A 08/13/2015   Procedure: TRANSVESICLE OPEN PROSTATECTOMY** ;  Surgeon: Carolan Clines, MD;  Location: [facility];  Service: Urology;  Laterality: N/A;   SPINE SURGERY     L4-L5       Home Medications    Prior to Admission medications   Medication Sig Start Date End Date Taking? Authorizing Provider  amLODipine (NORVASC) 10 MG tablet Take 1 tablet (10 mg total) by mouth daily. 06/19/21   Wendie Agreste, MD  aspirin 81 MG tablet Take 81 mg by mouth daily. Reported on 09/05/2015    [provider]   B-D UF III MINI PEN NEEDLES 31G X 5 MM MISC USE FOR INSULIN PEN TWICE A DAY 04/29/21   Elayne Snare, MD  colchicine 0.6 MG tablet 2 tablets at onset of foot pain, take third tablet after 1 hour and then 1 tablet daily with food until pain improved 04/24/21   Elayne Snare, MD  hydrocortisone (CORTEF) 20 MG tablet Take 10-20 mg by mouth 2 (two) times daily. Takes 1 tablet in the morning and 1/2 tablet at night    [provider]  Insulin Lispro Prot & Lispro (HUMALOG 75/25 MIX) (75-25) 100 UNIT/ML Kwikpen INJECT 20 UNITS UNDER THE SKIN BEFORE BREAKFAST AND 6 UNITS BEFORE SUPPER. (REPLACES 70/30) 04/29/21   Elayne Snare, MD  JANUMET XR [phone] MG TB24 TAKE 1 TABLET BY MOUTH EVERY DAY 05/30/21   Elayne Snare, MD  levothyroxine (SYNTHROID) 88 MCG tablet Take 1 tablet (88 mcg total) by mouth daily. 06/19/21   Wendie Agreste, MD  lisinopril (ZESTRIL) 5 MG tablet Take 1 tablet (5 mg total) by mouth daily. 10/31/20   Elayne Snare, MD  metoprolol succinate (TOPROL-XL) 25 MG 24 hr tablet Take 1 tablet (25 mg total) by mouth daily. 06/19/21   Wendie Agreste, MD  ONETOUCH VERIO test strip USE AS INSTRUCTED TO CHECK BLOOD SUGAR ONCE DAILY. 11/23/20   Elayne Snare, MD  rosuvastatin (CRESTOR) 40 MG tablet TAKE 1 TABLET BY MOUTH EVERY DAY 06/02/21   Wendie Agreste, MD  testosterone cypionate (DEPO-TESTOSTERONE) 200 MG/ML injection Inject 1.5 mLs (300 mg total) into the muscle every 21 ( twenty-one) days. 11/23/14 03/14/15  Elayne Snare, MD    Family History Family History  Problem Relation Age of Onset   Cancer Mother        pancreatic ca    Social History Social History   Tobacco Use   Smoking status: Former    Types: Cigarettes    Quit date: 06/24/1993    Years since quitting: 28.0   Smokeless tobacco: Never  Vaping Use   Vaping Use: Never used  Substance Use Topics   Alcohol use: No   Drug use: No     Allergies   Hydrochlorothiazide and Other   Review of Systems Review of Systems See  HPI  Physical Exam Triage Vital Signs ED Triage Vitals  Enc Vitals Group     BP 07/01/21 1219 124/78     Pulse Rate 07/01/21 1219 77     Resp 07/01/21 1219 18     Temp 07/01/21 1219 97.9 F (36.6 C)     Temp Source 07/01/21 1219 Oral     SpO2 07/01/21 1219 97 %     Weight 07/01/21 1220 230 lb (104.3 kg)     Height --      Head Circumference --  Peak Flow --      Pain Score 07/01/21 1220 5     Pain Loc --      Pain Edu? --      Excl. in [place]? --    No data found.  Updated Vital Signs BP 124/78 (BP Location: Right Arm)    Pulse 77    Temp 97.9 F (36.6 C) (Oral)    Resp 18    Wt 104.3 kg    SpO2 97%    BMI 32.08 kg/m      Physical Exam Constitutional:      General: He is not in acute distress.    Appearance: He is well-developed and normal weight.  HENT:     Head: Normocephalic and atraumatic.  Eyes:     Conjunctiva/sclera: Conjunctivae normal.     Pupils: Pupils are equal, round, and reactive to light.  Neck:     Vascular: No JVD.  Cardiovascular:     Rate and Rhythm: Normal rate and regular rhythm. No extrasystoles are present.    Chest Wall: PMI is not displaced.     Heart sounds: Normal heart sounds. No murmur heard. Pulmonary:     Effort: Pulmonary effort is normal. No respiratory distress.     Breath sounds: Normal breath sounds. No decreased breath sounds.  Chest:     Chest wall: No tenderness.  Abdominal:     General: There is no distension.     Palpations: Abdomen is soft. There is no hepatomegaly or splenomegaly.     Tenderness: There is no abdominal tenderness.  Musculoskeletal:        General: Normal range of motion.     Cervical back: Normal range of motion.     Right lower leg: No edema.     Left lower leg: No edema.  Skin:    General: Skin is warm and dry.  Neurological:     General: No focal deficit present.     Mental Status: He is alert.     UC Treatments / Results  Labs (all labs ordered are listed, but only abnormal results are  displayed) Labs Reviewed - No data to display  EKG   Radiology No results found.  Procedures ED EKG  Date/Time: 07/01/2021 1:01 PM Performed by: Raylene Everts, MD Authorized by: Raylene Everts, MD   ECG reviewed by ED Physician in the absence of a cardiologist: yes   Previous ECG:    Previous ECG:  Compared to current   Similarity:  No change Interpretation:    Interpretation: normal     Details:  With additional rare PAC Rate:    ECG rate assessment: normal   Rhythm:    Rhythm: sinus rhythm   Ectopy:    Ectopy: PAC   QRS:    QRS axis:  Normal   QRS intervals:  Normal   QRS conduction: normal   ST segments:    ST segments:  Normal T waves:    T waves: normal   Q waves:    Abnormal Q-waves: not present   (including critical care time)  Medications Ordered in UC Medications - No data to display  Initial Impression / Assessment and Plan / UC Course  I have reviewed the triage vital signs and the nursing notes.  Pertinent labs & imaging results that were available during my care of the patient were reviewed by me and considered in my medical decision making (see chart for details).     EKG  is unchanged I explained to the patient that his vital signs are stable, his examination is normal, his EKG is unchanged. He needs to see cardiology in follow-up.  It is explained to him that if he arrives at the cardiology office acutely having chest pain he will be redirected to the emergency room for evaluation.  If he remains as he is now, with no appreciable pain, he will be evaluated by cardiology. Final Clinical Impressions(s) / UC Diagnoses   Final diagnoses:  Chest pain, unspecified type     Discharge Instructions      You have an appointment with Dr. [place] at 2:00 today.  It is important that you keep this appointment  If you become worse instead of better prior to the appointment, with increasing chest pain or shortness of breath you need to go  directly to an emergency room or call 911   ED Prescriptions   None    PDMP not reviewed this encounter.   Raylene Everts, MD 07/01/21 1300    Raylene Everts, MD 07/01/21 [phone]

## 2021-07-01 NOTE — Telephone Encounter (Signed)
Pt c/o of Chest Pain: STAT if CP now or developed within 24 hours  1. Are you having CP right now? Yes   2. Are you experiencing any other symptoms (ex. SOB, nausea, vomiting, sweating)? No   3. How long have you been experiencing CP? Over the weekend  4. Is your CP continuous or coming and going? Coming and going  5. Have you taken Nitroglycerin? No  ?

## 2021-07-02 ENCOUNTER — Telehealth (HOSPITAL_COMMUNITY): Payer: Self-pay | Admitting: *Deleted

## 2021-07-02 NOTE — Telephone Encounter (Signed)
Close encounter 

## 2021-07-03 ENCOUNTER — Other Ambulatory Visit: Payer: Self-pay

## 2021-07-03 ENCOUNTER — Ambulatory Visit (HOSPITAL_COMMUNITY)
Admission: RE | Admit: 2021-07-03 | Discharge: 2021-07-03 | Disposition: A | Discharge status: Home / Self Care | Payer: Medicare Other | Source: Ambulatory Visit | Attending: Cardiovascular Disease | Admitting: Cardiovascular Disease

## 2021-07-03 DIAGNOSIS — E785 Hyperlipidemia, unspecified: Secondary | ICD-10-CM

## 2021-07-03 DIAGNOSIS — E1169 Type 2 diabetes mellitus with other specified complication: Secondary | ICD-10-CM

## 2021-07-03 DIAGNOSIS — I10 Essential (primary) hypertension: Secondary | ICD-10-CM

## 2021-07-03 DIAGNOSIS — I25118 Atherosclerotic heart disease of native coronary artery with other forms of angina pectoris: Secondary | ICD-10-CM

## 2021-07-03 DIAGNOSIS — E669 Obesity, unspecified: Secondary | ICD-10-CM | POA: Diagnosis present

## 2021-07-03 LAB — MYOCARDIAL PERFUSION IMAGING
Base ST Depression (mm): 0 mm
LV dias vol: 102 mL (ref 62–150)
LV sys vol: 44 mL
Nuc Stress EF: 57 %
Peak HR: 75 {beats}/min
Rest HR: 56 {beats}/min
Rest Nuclear Isotope Dose: 10 mCi
SDS: 1
SRS: 1
SSS: 2
ST Depression (mm): 0 mm
Stress Nuclear Isotope Dose: 31.9 mCi
TID: 1.01

## 2021-07-03 MED ORDER — REGADENOSON 0.4 MG/5ML IV SOLN
0.4000 mg | Freq: Once | INTRAVENOUS | Status: AC
  Administered 2021-07-03: 0.4 mg via INTRAVENOUS

## 2021-07-03 MED ORDER — TECHNETIUM TC 99M TETROFOSMIN IV KIT
10.0000 | PACK | Freq: Once | INTRAVENOUS | Status: AC | PRN
  Administered 2021-07-03: 10 via INTRAVENOUS
  Filled 2021-07-03: qty 10

## 2021-07-03 MED ORDER — TECHNETIUM TC 99M TETROFOSMIN IV KIT
31.9000 | PACK | Freq: Once | INTRAVENOUS | Status: AC | PRN
  Administered 2021-07-03: 31.9 via INTRAVENOUS
  Filled 2021-07-03: qty 32

## 2021-07-08 ENCOUNTER — Emergency Department (HOSPITAL_COMMUNITY): Payer: Medicare Other

## 2021-07-08 ENCOUNTER — Other Ambulatory Visit: Payer: Self-pay

## 2021-07-08 ENCOUNTER — Emergency Department (HOSPITAL_COMMUNITY)
Admission: EM | Admit: 2021-07-08 | Discharge: 2021-07-08 | Disposition: A | Discharge status: Home / Self Care | Payer: Medicare Other | Attending: Emergency Medicine | Admitting: Emergency Medicine

## 2021-07-08 ENCOUNTER — Telehealth: Payer: Self-pay | Admitting: Cardiology

## 2021-07-08 DIAGNOSIS — I2510 Atherosclerotic heart disease of native coronary artery without angina pectoris: Secondary | ICD-10-CM | POA: Insufficient documentation

## 2021-07-08 DIAGNOSIS — Z794 Long term (current) use of insulin: Secondary | ICD-10-CM | POA: Diagnosis not present

## 2021-07-08 DIAGNOSIS — Z7984 Long term (current) use of oral hypoglycemic drugs: Secondary | ICD-10-CM | POA: Diagnosis not present

## 2021-07-08 DIAGNOSIS — Z7982 Long term (current) use of aspirin: Secondary | ICD-10-CM | POA: Insufficient documentation

## 2021-07-08 DIAGNOSIS — E119 Type 2 diabetes mellitus without complications: Secondary | ICD-10-CM | POA: Insufficient documentation

## 2021-07-08 DIAGNOSIS — Z79899 Other long term (current) drug therapy: Secondary | ICD-10-CM | POA: Diagnosis not present

## 2021-07-08 DIAGNOSIS — I10 Essential (primary) hypertension: Secondary | ICD-10-CM | POA: Diagnosis not present

## 2021-07-08 DIAGNOSIS — K5730 Diverticulosis of large intestine without perforation or abscess without bleeding: Secondary | ICD-10-CM | POA: Diagnosis not present

## 2021-07-08 DIAGNOSIS — M549 Dorsalgia, unspecified: Secondary | ICD-10-CM | POA: Insufficient documentation

## 2021-07-08 DIAGNOSIS — R079 Chest pain, unspecified: Secondary | ICD-10-CM

## 2021-07-08 DIAGNOSIS — R0789 Other chest pain: Secondary | ICD-10-CM | POA: Diagnosis present

## 2021-07-08 DIAGNOSIS — N281 Cyst of kidney, acquired: Secondary | ICD-10-CM | POA: Diagnosis not present

## 2021-07-08 DIAGNOSIS — I700 Atherosclerosis of aorta: Secondary | ICD-10-CM | POA: Diagnosis not present

## 2021-07-08 LAB — BASIC METABOLIC PANEL
Anion gap: 6 (ref 5–15)
BUN: 23 mg/dL (ref 8–23)
CO2: 21 mmol/L — ABNORMAL LOW (ref 22–32)
Calcium: 8.7 mg/dL — ABNORMAL LOW (ref 8.9–10.3)
Chloride: 110 mmol/L (ref 98–111)
Creatinine, Ser: 1.56 mg/dL — ABNORMAL HIGH (ref 0.61–1.24)
GFR, Estimated: 45 mL/min — ABNORMAL LOW (ref >60)
Glucose, Bld: 159 mg/dL — ABNORMAL HIGH (ref 70–99)
Potassium: 4.2 mmol/L (ref 3.5–5.1)
Sodium: 137 mmol/L (ref 135–145)

## 2021-07-08 LAB — CBC
HCT: 42.2 % (ref 39.0–52.0)
Hemoglobin: 13.2 g/dL (ref 13.0–17.0)
MCH: 28 pg (ref 26.0–34.0)
MCHC: 31.3 g/dL (ref 30.0–36.0)
MCV: 89.6 fL (ref 80.0–100.0)
Platelets: 362 10*3/uL (ref 150–400)
RBC: 4.71 MIL/uL (ref 4.22–5.81)
RDW: 14.4 % (ref 11.5–15.5)
WBC: 11.2 10*3/uL — ABNORMAL HIGH (ref 4.0–10.5)
nRBC: 0 % (ref 0.0–0.2)

## 2021-07-08 LAB — TROPONIN I (HIGH SENSITIVITY)
Troponin I (High Sensitivity): 11 ng/L (ref <18)
Troponin I (High Sensitivity): 10 ng/L (ref <18)
Troponin I (High Sensitivity): 12 ng/L (ref <18)

## 2021-07-08 MED ORDER — MORPHINE SULFATE (PF) 4 MG/ML IV SOLN
4.0000 mg | Freq: Once | INTRAVENOUS | Status: AC
  Administered 2021-07-08: 4 mg via INTRAVENOUS
  Filled 2021-07-08: qty 1

## 2021-07-08 MED ORDER — HYDROCODONE-ACETAMINOPHEN 5-325 MG PO TABS: 1.0000 | ORAL_TABLET | Freq: Three times a day (TID) | ORAL | 0 refills | Status: DC | PRN

## 2021-07-08 MED ORDER — NITROGLYCERIN 0.4 MG SL SUBL
0.4000 mg | SUBLINGUAL_TABLET | SUBLINGUAL | Status: DC | PRN
  Administered 2021-07-08: 0.4 mg via SUBLINGUAL
  Filled 2021-07-08: qty 1

## 2021-07-08 MED ORDER — IOHEXOL 350 MG/ML SOLN
80.0000 mL | Freq: Once | INTRAVENOUS | Status: AC | PRN
  Administered 2021-07-08: 80 mL via INTRAVENOUS

## 2021-07-08 NOTE — Discharge Instructions (Addendum)
You have been seen and discharged from the emergency department.  Your cardiac work-up was negative, this and combined with your recent stress test gives [place] confidence to send you home.  However you should follow-up with your cardiologist as an outpatient next week.  Your CAT scan identified some lung scarring. Repeat CT scanning at a later is recommended to ensure no concerning changes.  Take pain medicine as needed for what appears to be musculoskeletal discomfort.  Do not mix this medication with alcohol or other sedating medications. Do not drive or do heavy physical activity until you know how this medication affects you.  It may cause drowsiness.  Follow-up with your primary provider for further evaluation and further care. Take home medications as prescribed. If you have any worsening symptoms or further concerns for your health please return to an emergency department for further evaluation.

## 2021-07-08 NOTE — Telephone Encounter (Signed)
Received call into triage. Spoke with patient who reports having chest pain. He wanted to see Dr. Ellyn Hack on Friday, but Dr. Ellyn Hack was not in the clinic. He asked to see him today as well. I informed him that Dr. Ellyn Hack is not in the clinic today. I explained to patient that because he is having active chest pain, it is best to go to the ED, that it is safer for him to be seen right away.we would call 911 if he were to get an appointment. He stated, "All right, thank you." He ended the call.

## 2021-07-08 NOTE — ED Notes (Signed)
Patient verbalizes understanding of d/c instructions. Opportunities for questions and answers were provided. Pt d/c from ED and wheeled to lobby with wife.  

## 2021-07-08 NOTE — Telephone Encounter (Signed)
Patient is currently in the ER. 

## 2021-07-08 NOTE — Telephone Encounter (Signed)
Pt c/o of Chest Pain: STAT if CP now or developed within 24 hours  1. Are you having CP right now? yes  2. Are you experiencing any other symptoms (ex. SOB, nausea, vomiting, sweating)? no  3. How long have you been experiencing CP? Since last week  4. Is your CP continuous or coming and going? Continuous   5. Have you taken Nitroglycerin? No  ?

## 2021-07-08 NOTE — ED Notes (Signed)
Pt placed back on cardiac monitor. Pt's wife is at bedside.

## 2021-07-08 NOTE — ED Notes (Signed)
No improvement in pain after receiving nitro

## 2021-07-08 NOTE — ED Provider Notes (Addendum)
[facility] Provider Note   CSN: [id number] Arrival date & time: 07/08/21  1114     History  Chief Complaint  Patient presents with   Chest Pain    Rajveer Handler is a 76 y.o. male.  HPI  76 year old male with past medical history of CAD with previous stents, HTN, HLD, obesity, DM, provoked DVT not on anticoagulation presents emergency department with left-sided chest pain.  Patient states this has been intermittent for about the past week but today more persistent and severe.  He describes it as just above his left breast, radiating straight through to his back and shoulder blade and down the left arm.  Patient states has had symptoms like this before when he was diagnosed with his cardiac disease in 2004 when he had stents placed.  Denies any shortness of breath, cough, hemoptysis.  No swelling of his lower extremities.  No fever.  No abdominal/GI symptoms.  No history of aortic aneurysm.  Home Medications Prior to Admission medications   Medication Sig Start Date End Date Taking? Authorizing Provider  amLODipine (NORVASC) 10 MG tablet Take 1 tablet (10 mg total) by mouth daily. 06/19/21  Yes Wendie Agreste, MD  aspirin 81 MG tablet Take 81 mg by mouth daily. Reported on 09/05/2015   Yes [provider]  colchicine 0.6 MG tablet 2 tablets at onset of foot pain, take third tablet after 1 hour and then 1 tablet daily with food until pain improved Patient taking differently: Take 0.6 mg by mouth See admin instructions. Take 2 tablets by mouth for onset of foot pain, take third tablet after 1 hour and then take 1 tablet by mouthdaily with food until pain improved 04/24/21  Yes Elayne Snare, MD  HYDROcodone-acetaminophen (NORCO) 5-325 MG tablet Take 1 tablet by mouth every 8 (eight) hours as needed for moderate pain. 07/08/21  Yes Arlie Riker M, DO  hydrocortisone (CORTEF) 20 MG tablet Take 10-20 mg by mouth See admin instructions. Takes 1  tablet 20 mg) by mouth in the morning and 1/2 tablet (10 mg )by mouth at night   Yes [provider]  Insulin Lispro Prot & Lispro (HUMALOG 75/25 MIX) (75-25) 100 UNIT/ML Kwikpen INJECT 20 UNITS UNDER THE SKIN BEFORE BREAKFAST AND 6 UNITS BEFORE SUPPER. (REPLACES 70/30) Patient taking differently: Inject 8-20 Units into the skin See admin instructions. Inject 20 units under the skin before breakfast and 68 units before supper. 04/29/21  Yes Elayne Snare, MD  JANUMET XR [phone] MG TB24 TAKE 1 TABLET BY MOUTH EVERY DAY Patient taking differently: Take 1 tablet by mouth every evening. 05/30/21  Yes Elayne Snare, MD  levothyroxine (SYNTHROID) 88 MCG tablet Take 1 tablet (88 mcg total) by mouth daily. 06/19/21  Yes Wendie Agreste, MD  lisinopril (ZESTRIL) 5 MG tablet Take 1 tablet (5 mg total) by mouth daily. 10/31/20  Yes Elayne Snare, MD  metoprolol succinate (TOPROL-XL) 25 MG 24 hr tablet Take 1 tablet (25 mg total) by mouth daily. 06/19/21  Yes Wendie Agreste, MD  nitroGLYCERIN (NITROSTAT) 0.4 MG SL tablet Place 1 tablet (0.4 mg total) under the tongue every 5 (five) minutes as needed for chest pain. 07/01/21 09/29/21 Yes Martinique, Peter M, MD  rosuvastatin (CRESTOR) 40 MG tablet TAKE 1 TABLET BY MOUTH EVERY DAY Patient taking differently: Take 40 mg by mouth every evening. 06/02/21  Yes Wendie Agreste, MD  B-D UF III MINI PEN NEEDLES 31G X 5 MM MISC USE  FOR INSULIN PEN TWICE A DAY 04/29/21   Elayne Snare, MD  [facility] VERIO test strip USE AS INSTRUCTED TO CHECK BLOOD SUGAR ONCE DAILY. 11/23/20   Elayne Snare, MD  testosterone cypionate (DEPO-TESTOSTERONE) 200 MG/ML injection Inject 1.5 mLs (300 mg total) into the muscle every 21 ( twenty-one) days. 11/23/14 03/14/15  Elayne Snare, MD      Allergies    Hydrochlorothiazide and Other    Review of Systems   Review of Systems  Constitutional:  Negative for fever.  Respiratory:  Negative for shortness of breath.   Cardiovascular:  Positive for chest  pain. Negative for palpitations and leg swelling.  Gastrointestinal:  Negative for abdominal pain, diarrhea and vomiting.  Genitourinary:  Negative for flank pain.  Musculoskeletal:  Positive for back pain. Negative for neck pain.  Skin:  Negative for rash.  Neurological:  Negative for headaches.   Physical Exam Updated Vital Signs BP (!) 150/91    Pulse 73    Temp 98.1 F (36.7 C) (Oral)    Resp (!) 21    SpO2 96%  Physical Exam  ED Results / Procedures / Treatments   Labs (all labs ordered are listed, but only abnormal results are displayed) Labs Reviewed  BASIC METABOLIC PANEL - Abnormal; Notable for the following components:      Result Value   CO2 21 (*)    Glucose, Bld 159 (*)    Creatinine, Ser 1.56 (*)    Calcium 8.7 (*)    GFR, Estimated 45 (*)    All other components within normal limits  CBC - Abnormal; Notable for the following components:   WBC 11.2 (*)    All other components within normal limits  TROPONIN I (HIGH SENSITIVITY)  TROPONIN I (HIGH SENSITIVITY)  TROPONIN I (HIGH SENSITIVITY)    EKG EKG Interpretation  Date/Time:  Monday July 08 2021 12:17:06 EST Ventricular Rate:  67 PR Interval:  222 QRS Duration: 82 QT Interval:  430 QTC Calculation: 454 R Axis:   8 Text Interpretation: Sinus rhythm with 1st degree A-V block with Premature supraventricular complexes Otherwise normal ECG When compared with ECG of 31-Jul-2002 04:20, PREVIOUS ECG IS PRESENT Similar to previous Confirmed by Lavenia Atlas [phone]) on 07/08/2021 3:26:18 PM  Radiology DG Chest 2 View  Result Date: 07/08/2021 CLINICAL DATA:  Chest pain, LEFT-sided chest pain with radiation to LEFT arm and back. EXAM: CHEST - 2 VIEW COMPARISON:  August 14, 2015. FINDINGS: The heart size and mediastinal contours are within normal limits. Both lungs are clear. The visualized skeletal structures are unremarkable. IMPRESSION: No active cardiopulmonary disease. Electronically Signed   By: Zetta Bills M.D.   On: 07/08/2021 12:53   CT Angio Chest/Abd/Pel for Dissection W and/or W/WO  Result Date: 07/08/2021 CLINICAL DATA:  Chest and back pain.  Concern for aortic dissection. EXAM: CT ANGIOGRAPHY CHEST, ABDOMEN AND PELVIS TECHNIQUE: Non-contrast CT of the chest was initially obtained. Multidetector CT imaging through the chest, abdomen and pelvis was performed using the standard protocol during bolus administration of intravenous contrast. Multiplanar reconstructed images and MIPs were obtained and reviewed to evaluate the vascular anatomy. RADIATION DOSE REDUCTION: This exam was performed according to the departmental dose-optimization program which includes automated exposure control, adjustment of the mA and/or kV according to patient size and/or use of iterative reconstruction technique. CONTRAST:  74mL OMNIPAQUE IOHEXOL 350 MG/ML SOLN COMPARISON:  CT of the abdomen pelvis dated 06/08/2015. FINDINGS: CTA CHEST FINDINGS Cardiovascular: There is no cardiomegaly or  pericardial effusion. There is coronary vascular calcification. Mild atherosclerotic calcification of the thoracic aorta. No aneurysmal dilatation or dissection. The origins of the great vessels of the aortic arch appear patent as visualized. No pulmonary artery embolus identified. Mediastinum/Nodes: No hilar or mediastinal adenopathy. The esophagus and the thyroid gland are grossly unremarkable. No mediastinal fluid collection. Lungs/Pleura: Bibasilar linear atelectasis/scarring. No focal consolidation, pleural effusion, or pneumothorax. Left posterior apical subpleural reticulonodular densities, likely chronic changes and scarring. Pneumonia is less likely but not excluded. Clinical correlation and follow-up with CT in 3 months recommended to exclude malignancy. Musculoskeletal: Degenerative changes of the spine. No acute osseous pathology. Review of the MIP images confirms the above findings. CTA ABDOMEN AND PELVIS FINDINGS VASCULAR Aorta:  Mild atherosclerotic calcification. No aneurysmal dilatation or dissection. Celiac: Patent without evidence of aneurysm, dissection, vasculitis or significant stenosis. SMA: Patent without evidence of aneurysm, dissection, vasculitis or significant stenosis. Renals: There is atherosclerotic calcification of the origin of the renal arteries. The renal arteries remain patent. IMA: Patent without evidence of aneurysm, dissection, vasculitis or significant stenosis. Inflow: Mild atherosclerotic calcification. No aneurysmal dilatation or dissection. Veins: No obvious venous abnormality within the limitations of this arterial phase study. Review of the MIP images confirms the above findings. NON-VASCULAR No intra-abdominal free air or free fluid. Hepatobiliary: Fatty liver. No intrahepatic biliary dilatation. The gallbladder is unremarkable. Pancreas: Unremarkable. No pancreatic ductal dilatation or surrounding inflammatory changes. Spleen: Normal in size without focal abnormality. Adrenals/Urinary Tract: The adrenal glands unremarkable. There is no hydronephrosis on either side. There is a 6 mm left renal upper pole nonobstructing stone. Small bilateral renal cysts and additional hypodense lesions which are not characterized. Further characterization with ultrasound a nonemergent/outpatient basis recommended. There is a 1.5 cm exophytic lesion with apparent enhancement from the inferior pole of the left kidney. Although this may represent lobulated renal cortex, a renal mass is not excluded. This however appears to have been present on the CT of 2016 although increased since 2016. Attention on ultrasound recommended. The visualized ureters and urinary bladder appear unremarkable. Stomach/Bowel: There is sigmoid diverticulosis without active inflammatory changes. There is no bowel obstruction or active inflammation. The appendix is normal. Lymphatic: No adenopathy. Reproductive: Prostatectomy. Other: Midline vertical  anterior pelvic wall incisional scar. Musculoskeletal: Osteopenia with degenerative changes of the spine. No acute osseous pathology. Review of the MIP images confirms the above findings. IMPRESSION: 1. No acute intrathoracic, abdominal, or pelvic pathology. No aortic dissection or aneurysm. 2. Focal area of probable scarring involving the posterior left upper lobe. Follow-up with CT in 3 months recommended. 3. A 6 mm nonobstructing left renal upper pole stone. No hydronephrosis. 4. Sigmoid diverticulosis without active inflammatory changes. No bowel obstruction. Normal appendix. 5. Bilateral renal cysts as well as a 1.5 cm exophytic lesion from the inferior pole of the left kidney with possible enhancement. Further evaluation with ultrasound on a nonemergent/outpatient basis recommended. 6. Aortic Atherosclerosis (ICD10-I70.0). Electronically Signed   By: Anner Crete M.D.   On: 07/08/2021 19:43    Procedures Procedures    Medications Ordered in ED Medications  nitroGLYCERIN (NITROSTAT) SL tablet 0.4 mg (0.4 mg Sublingual Given 07/08/21 1733)  iohexol (OMNIPAQUE) 350 MG/ML injection 80 mL (80 mLs Intravenous Contrast Given 07/08/21 1909)  morphine 4 MG/ML injection 4 mg (4 mg Intravenous Given 07/08/21 2114)    ED Course/ Medical Decision Making/ A&P  Medical Decision Making Amount and/or Complexity of Data Reviewed Labs: ordered. Radiology: ordered.  Risk Prescription drug management.   This patient presents to the ED for concern of chest and back pain, this involves an extensive number of treatment options, and is a complaint that carries with it a high risk of complications and morbidity.  The differential diagnosis includes ACS, PE, dissection, musculoskeletal pain   Additional history obtained: -Additional history obtained from wife at bedside -External records from outside source obtained and reviewed including: Chart review including previous notes,  labs, imaging, consultation notes   Lab Tests: -I ordered, reviewed, and interpreted labs.  The pertinent results include: Baseline findings including 3 negative troponins with no significant delta   EKG -Sinus rhythm with no acute changes   Imaging Studies ordered: -I ordered imaging studies including chest x-ray and CT dissection study -I independently visualized and interpreted imaging which showed lung scarring in the left upper lobe without any other acute vascular/aortic/chest findings -I agree with the radiologist interpretation   Medicines ordered and prescription drug management: -I ordered medication including pain medicine for symptoms -Reevaluation of the patient after these medicines showed that the patient improved -I have reviewed the patients home medicines and have made adjustments as needed   Consultations Obtained: I requested consultation with the cardiology fellow Dr. Alfred Levins for [facility],  and discussed lab and imaging findings as well as pertinent plan - they recommend: Outpatient follow-up   ED Course: 76 year old male presents emergency department left-sided chest pain that radiates through to his back.  Did not take any medications for it, present for a week, worse today.  Pain radiates down his left arm.  EKG is unchanged, troponins have been negative.  Given the nature of the radiation of pain a CT dissection study was pursued which showed no PE/aortic abnormality.  Did identify some left upper lobe scarring that could potentially be causing some pleurisy.  No change in regards to the chest pain with nitro.  Improvement with pain medicine.  Pain seems more musculoskeletal from my standpoint.  Patient follows with [facility] for cardiology.  Recently had a stress test 5 days ago that was unremarkable.  Given the patient was high risk with history of CAD/stent cardiology fellow was consulted.  We reviewed the EKG, stress test and symptoms.  We find this to be unlikely ACS  given his current work-up and reassuring stress 5 days ago.  Plan to treat symptomatically with outpatient cardiology follow-up this week.   Cardiac Monitoring: The patient was maintained on a cardiac monitor.  I personally viewed and interpreted the cardiac monitored which showed an underlying rhythm of: Normal sinus rhythm   Reevaluation: After the interventions noted above, I reevaluated the patient and found that they have :improved   Dispostion: Patient at this time appears safe and stable for discharge and close outpatient follow up. Discharge plan and strict return to ED precautions discussed, patient verbalizes understanding and agreement.   Of note there was concern for some extravasation of contrast during scan, compression and pack placed. On my review looks appropriate, no concern for compartment syndrome. Precautions discussed.      Final Clinical Impression(s) / ED Diagnoses Final diagnoses:  Chest pain, unspecified type    Rx / DC Orders ED Discharge Orders          Ordered    HYDROcodone-acetaminophen (NORCO) 5-325 MG tablet  Every 8 hours PRN        07/08/21 2124  Lorelle Gibbs, DO 07/08/21 2142    Lorelle Gibbs, DO 07/13/21 [phone]

## 2021-07-08 NOTE — ED Notes (Signed)
Attempted USIV  1 without success, IV x 1 without success

## 2021-07-08 NOTE — ED Triage Notes (Signed)
Pt from home for eval of L sided chest pain with radiation to L arm and back. Hx MI. Pain constant and not worse with movement or position changes. Denies nausea, sob, or dizziness/weakness.

## 2021-07-09 NOTE — Telephone Encounter (Signed)
Reviewed

## 2021-07-15 ENCOUNTER — Ambulatory Visit (INDEPENDENT_AMBULATORY_CARE_PROVIDER_SITE_OTHER): Payer: Medicare Other | Admitting: Family Medicine

## 2021-07-15 VITALS — BP 136/78 | HR 64 | Temp 98.4°F | Resp 17 | Ht 71.0 in | Wt 225.6 lb

## 2021-07-15 DIAGNOSIS — D72829 Elevated white blood cell count, unspecified: Secondary | ICD-10-CM | POA: Diagnosis not present

## 2021-07-15 DIAGNOSIS — N289 Disorder of kidney and ureter, unspecified: Secondary | ICD-10-CM

## 2021-07-15 DIAGNOSIS — R202 Paresthesia of skin: Secondary | ICD-10-CM | POA: Diagnosis not present

## 2021-07-15 DIAGNOSIS — R079 Chest pain, unspecified: Secondary | ICD-10-CM

## 2021-07-15 DIAGNOSIS — M542 Cervicalgia: Secondary | ICD-10-CM | POA: Diagnosis not present

## 2021-07-15 DIAGNOSIS — M79602 Pain in left arm: Secondary | ICD-10-CM | POA: Diagnosis not present

## 2021-07-15 DIAGNOSIS — J984 Other disorders of lung: Secondary | ICD-10-CM

## 2021-07-15 MED ORDER — CYCLOBENZAPRINE HCL 5 MG PO TABS: 5.0000 mg | ORAL_TABLET | Freq: Three times a day (TID) | ORAL | 1 refills | Status: DC | PRN

## 2021-07-15 NOTE — Progress Notes (Signed)
Subjective:  Patient ID: Carl Gutierrez, male    DOB: 06-18-1945  Age: 76 y.o. MRN: [id number]  CC:  Chief Complaint  Patient presents with   Follow-up    Pt here for follow up from ED was seen week ago on Monday, notes he has pain still it is somewhat better but still painful, mostly concentrated to left side     HPI [facility] presents for   Chest pain Follow up from ER visit  January 23 through [facility] ER. Urgent care eval on 1/16  - cardiology eval that day. Myocardial perfusion on 1/18 with no ischemia and normal EF. No change from prior.  History of CAD, hypertension, hyperlipidemia, diabetes.  Intermittent chest pain at that time for 1 week but more persistent and severe above left breast radiating to the back and shoulder blade, down left arm.  Concern for possible cardiac cause.  Seen in ER. WBC 11.2 but otherwise CBC okay.  Glucose 159, creatinine 1.56.  Troponin negative x3.  EKG with first-degree AV block, PVC otherwise normal and compared to previous reading. Chest x-ray with no active cardiopulmonary disease. CT angiogram/chest//pelvis for dissection without acute intrathoracic abdominal or pelvic pathology.  No pulmonary embolus.  Left upper lobe scarring as possible contributor to possible pleurisy.  No change with nitroglycerin.  Improved with pain meds.  Appear to be more musculoskeletal from ER standpoint.  Stress test 5 days prior without apparent concern.  Cardiology was consulted given his history of CAD.  Unlikely ACS given current work-up and reassuring stress test 5 days prior.  Plan for symptomatic treatment with outpatient cardiology follow-up.  Hydrocodone prescribed. Cardiology appt 2/22.   Still some soreness into upper chest, back of shoulder blade and into left arm. Slight crick in neck on the left side. Pain into left arm and hand with feeling like hand asleep this morning. Sore in arm and back of hand of left.  No change in activities prior to onset of  sx's. Started when he woke up.   No change in pain with hydrocodone  - not taking any pain meds now.  Better today than last week - best morning in past 2 weeks.  No fever, no rash, no cough, no dyspnea.  Lab Results  Component Value Date   HGBA1C 7.6 (H) 04/19/2021  Home blood sugars past few days - 100 this am.    Few abnormalities were noted on CT as below with plan follow-up.   IMPRESSION: 1. No acute intrathoracic, abdominal, or pelvic pathology. No aortic dissection or aneurysm. 2. Focal area of probable scarring involving the posterior left upper lobe. Follow-up with CT in 3 months recommended. 3. A 6 mm nonobstructing left renal upper pole stone. No hydronephrosis. 4. Sigmoid diverticulosis without active inflammatory changes. No bowel obstruction. Normal appendix. 5. Bilateral renal cysts as well as a 1.5 cm exophytic lesion from the inferior pole of the left kidney with possible enhancement. Further evaluation with ultrasound on a nonemergent/outpatient basis recommended. 6. Aortic Atherosclerosis (ICD10-I70.0). Electronically Signed   By: Anner Crete M.D.   On: 07/08/2021 19:43        History Patient Active Problem List   Diagnosis Date Noted   Gout of big toe 10/31/2020   Stage 3a chronic kidney disease ([place]) 01/04/2020   Anemia in chronic kidney disease 11/30/2015   Recurrent deep vein thrombosis (DVT) ([place]) 10/01/2015   Hematuria, unspecified 10/01/2015   Elevated serum creatinine 10/01/2015   BPH (benign prostatic hyperplasia)  08/13/2015   Benign localized hyperplasia of prostate with urinary obstruction 07/23/2015   Preop cardiovascular exam 07/17/2015   Encounter for CDL (commercial driving license) exam 16/03/9603   Obesity (BMI 30-39.9) 06/26/2013   Essential hypertension 06/26/2013   Panhypopituitarism ([place]) 11/17/2011     11/17/2011   Diabetes mellitus ([place]) 11/17/2011   Hyperlipidemia associated with type 2 diabetes mellitus ([place]) 11/17/2011   CAD S/P  percutaneous coronary angioplasty 07/19/2002   Past Medical History:  Diagnosis Date   CAD S/P percutaneous coronary angioplasty February 2004; 2008   PCI-dLAD: 2.25 mm x 16 mm Express BMS; Ramus- 2.75 mm x 18 mm Cypher DES (2.8 mm); follow-up 11/'08: Patent LAD/ramus stents. 90% small OM. EF 50-60%.; Myoview 1/'12: Exercise 9 min, 10 METS with no ischemia/infarction.    Clotting disorder (HCC)    Diabetes mellitus    Low-dose metformin   DJD (degenerative joint disease), lumbosacral     status post L4-L5 surgery   Essential hypertension    History of DVT of lower extremity     postoperative   Hyperlipidemia with target LDL less than 70    Hypothyroidism    Synthroid   Panhypopituitarism ([place])    Status post pituitary adenoma removal in 2012   Recurrent deep vein thrombosis (DVT) ([place]) 10/01/2015   Past Surgical History:  Procedure Laterality Date   BRAIN SURGERY  2012   Removal of pituitary adenoma   CARDIAC CATHETERIZATION  November 2008   Significant LAD tapering but no significant disease the distal stent. Patent Ramus stent. 90% lesion in small OM 2; small nondominant RCA. Medical therapy. EF 50-60%.   CORONARY ANGIOPLASTY WITH STENT PLACEMENT  February 2004   PCI-dLAD: 2.25 mm x 16 mm BMS;;PCI- RI: 2.75 mm 18 mm Cypher DES   NM MYOVIEW LTD  January 2012   Exercise ~9 min - 10 METS; no ischemia or infarction   PROSTATECTOMY N/A 08/13/2015   Procedure: TRANSVESICLE OPEN PROSTATECTOMY** ;  Surgeon: Carolan Clines, MD;  Location: [facility] ORS;  Service: Urology;  Laterality: N/A;   SPINE SURGERY     L4-L5   Allergies  Allergen Reactions   Hydrochlorothiazide     Leg and side pain with this   Other     Blood pressure med name unknown.   Prior to Admission medications   Medication Sig Start Date End Date Taking? Authorizing Provider  amLODipine (NORVASC) 10 MG tablet Take 1 tablet (10 mg total) by mouth daily. 06/19/21  Yes Wendie Agreste, MD  aspirin 81 MG tablet Take 81  mg by mouth daily. Reported on 09/05/2015   Yes [provider]  B-D UF III MINI PEN NEEDLES 31G X 5 MM MISC USE FOR INSULIN PEN TWICE A DAY 04/29/21  Yes Elayne Snare, MD  colchicine 0.6 MG tablet 2 tablets at onset of foot pain, take third tablet after 1 hour and then 1 tablet daily with food until pain improved Patient taking differently: Take 0.6 mg by mouth See admin instructions. Take 2 tablets by mouth for onset of foot pain, take third tablet after 1 hour and then take 1 tablet by mouthdaily with food until pain improved 04/24/21  Yes Elayne Snare, MD  HYDROcodone-acetaminophen (NORCO) 5-325 MG tablet Take 1 tablet by mouth every 8 (eight) hours as needed for moderate pain. 07/08/21  Yes Horton, Kristie M, DO  hydrocortisone (CORTEF) 20 MG tablet Take 10-20 mg by mouth See admin instructions. Takes 1 tablet 20 mg) by mouth in the morning  and 1/2 tablet (10 mg )by mouth at night   Yes [provider]  Insulin Lispro Prot & Lispro (HUMALOG 75/25 MIX) (75-25) 100 UNIT/ML Kwikpen INJECT 20 UNITS UNDER THE SKIN BEFORE BREAKFAST AND 6 UNITS BEFORE SUPPER. (REPLACES 70/30) Patient taking differently: Inject 8-20 Units into the skin See admin instructions. Inject 20 units under the skin before breakfast and 68 units before supper. 04/29/21  Yes Elayne Snare, MD  JANUMET XR [phone] MG TB24 TAKE 1 TABLET BY MOUTH EVERY DAY Patient taking differently: Take 1 tablet by mouth every evening. 05/30/21  Yes Elayne Snare, MD  levothyroxine (SYNTHROID) 88 MCG tablet Take 1 tablet (88 mcg total) by mouth daily. 06/19/21  Yes Wendie Agreste, MD  lisinopril (ZESTRIL) 5 MG tablet Take 1 tablet (5 mg total) by mouth daily. 10/31/20  Yes Elayne Snare, MD  metoprolol succinate (TOPROL-XL) 25 MG 24 hr tablet Take 1 tablet (25 mg total) by mouth daily. 06/19/21  Yes Wendie Agreste, MD  nitroGLYCERIN (NITROSTAT) 0.4 MG SL tablet Place 1 tablet (0.4 mg total) under the tongue every 5 (five) minutes as needed for  chest pain. 07/01/21 09/29/21 Yes Martinique, Peter M, MD  [facility] VERIO test strip USE AS INSTRUCTED TO CHECK BLOOD SUGAR ONCE DAILY. 11/23/20  Yes Elayne Snare, MD  rosuvastatin (CRESTOR) 40 MG tablet TAKE 1 TABLET BY MOUTH EVERY DAY Patient taking differently: Take 40 mg by mouth every evening. 06/02/21  Yes Wendie Agreste, MD  testosterone cypionate (DEPO-TESTOSTERONE) 200 MG/ML injection Inject 1.5 mLs (300 mg total) into the muscle every 21 ( twenty-one) days. 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 Exercises only occasionally. Not as much as he desires 2. 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   He does not smoke cigarettes -- quit in 1995. Does not drink alcohol.   Social Determinants of Health   Financial Resource Strain: Not on file  Food Insecurity: Not on file  Transportation Needs: Not on file  Physical Activity: Not on file  Stress: Not on file  Social Connections: Not on file  Intimate Partner Violence: Not on file    Review of Systems Per HPI.   Objective:   Vitals:   07/15/21 1439  BP: 136/78  Pulse: 64  Resp: 17  Temp: 98.4 F (36.9 C)  TempSrc: Temporal  SpO2: 97%  Weight: 225 lb 9.6 oz (102.3 kg)  Height: 5\' 11"  (1.803 m)     Physical Exam Vitals reviewed.   Constitutional:      Appearance: He is well-developed.  HENT:     Head: Normocephalic and atraumatic.  Neck:     Vascular: No carotid bruit or JVD.  Cardiovascular:     Rate and Rhythm: Normal rate and regular rhythm.     Heart sounds: Normal heart sounds. No murmur heard. Pulmonary:     Effort: Pulmonary effort is normal.     Breath sounds: Normal breath sounds. No rales.  Musculoskeletal:       Arms:     Right lower leg: No edema.     Left lower leg: No edema.  Skin:    General: Skin is warm and dry.  Neurological:     Mental Status: He is alert and oriented to person, place, and time.  Psychiatric:        Mood and Affect: Mood normal.       Assessment & Plan:  Brodee Mauritz is a 76 y.o. male . Chest pain, unspecified type Pain of left upper extremity - Plan: CK, cyclobenzaprine (FLEXERIL) 5 MG tablet, CK Left hand paresthesia - Plan: cyclobenzaprine (FLEXERIL) 5 MG tablet Neck pain on left side - Plan: cyclobenzaprine (FLEXERIL) 5 MG tablet  -Overall reassuring evaluation at the emergency room, recent cardiac testing, normal troponins, and reproducibility on exam less likely cardiac cause.  Some discomfort on the left neck, and into the left arm, upper chest, upper back could be related to cervical spine and possible radiculopathy.  Some improvement recently.  Gentle range of motion, continue monitoring for improvement with RTC precautions, trial of low-dose Flexeril if needed with potential side effects and risks of muscle relaxants discussed.  Held on prednisone as currently on hydrocortisone.  Check CPK but unlikely rhabdomyolysis.  Recheck 2 weeks  Leukocytosis, unspecified type - Plan: CBC  -Afebrile, denies new infectious symptoms.  Repeat CBC.  Lesion of left native kidney - Plan: US Renal  -As noted on imaging above, dedicated renal ultrasound for further evaluation, then possible urology follow-up.  Scarring of lung  -Pleurisy possible cause per ER visit but  other potential MSK causes above for chest symptoms.  Plan for repeat CT of In 3 months to evaluate probable areas of scar.  RTC precautions if new symptoms prior.  Recheck 2 weeks  41 minutes spent during visit, including chart review, ER note and study review, CT review, previous stress testing review, counseling and assimilation of information, exam, discussion of plan, and chart completion.    Meds ordered this encounter  Medications   cyclobenzaprine (FLEXERIL) 5 MG tablet    Sig: Take 1 tablet (5 mg total) by mouth 3 (three) times daily as needed for muscle spasms (start qhs prn due to sedation).    Dispense:  15 tablet    Refill:  1   Patient Instructions   Testing for the chest pain was reassuring and does not appear to be your heart at this time.  I am suspicious for possible pinched nerve from your neck or muscular cause of the pain.  If it is continuing to improve in the next day or 2 then no medication may be needed.  I did write for a muscle relaxant that can be taken up to 3 times per day as needed for tight muscle or pain but that can cause sedation and dizziness to be very careful with it and use it initially at night.  I also check some other blood work today to follow-up on the slight elevated blood count in the hospital as well as a muscle enzyme test to make sure that looks ok.    We  can check a repeat CT scan of lungs in 3 months to make sure the abnormal area still appears to be a scar.    Return to the clinic or go to the nearest emergency room if any of your symptoms worsen or new symptoms occur.        Signed,   [name], MD [place] 07/15/21 2:47 PM

## 2021-07-15 NOTE — Patient Instructions (Addendum)
°  Testing for the chest pain was reassuring and does not appear to be your heart at this time.  I am suspicious for possible pinched nerve from your neck or muscular cause of the pain.  If it is continuing to improve in the next day or 2 then no medication may be needed.  I did write for a muscle relaxant that can be taken up to 3 times per day as needed for tight muscle or pain but that can cause sedation and dizziness to be very careful with it and use it initially at night.  I also check some other blood work today to follow-up on the slight elevated blood count in the hospital as well as a muscle enzyme test to make sure that looks ok.    We can check a repeat CT scan of lungs in 3 months to make sure the abnormal area still appears to be a scar.    Return to the clinic or go to the nearest emergency room if any of your symptoms worsen or new symptoms occur.

## 2021-07-16 ENCOUNTER — Encounter: Payer: Self-pay | Admitting: Family Medicine

## 2021-07-16 LAB — CBC
HCT: 40.3 % (ref 39.0–52.0)
Hemoglobin: 13.2 g/dL (ref 13.0–17.0)
MCHC: 32.8 g/dL (ref 30.0–36.0)
MCV: 86.6 fl (ref 78.0–100.0)
Platelets: 357 10*3/uL (ref 150.0–400.0)
RBC: 4.65 Mil/uL (ref 4.22–5.81)
RDW: 15.2 % (ref 11.5–15.5)
WBC: 11.4 10*3/uL — ABNORMAL HIGH (ref 4.0–10.5)

## 2021-07-16 LAB — CK: Total CK: 71 U/L (ref 7–232)

## 2021-07-22 ENCOUNTER — Ambulatory Visit
Admission: RE | Admit: 2021-07-22 | Discharge: 2021-07-22 | Disposition: A | Discharge status: Home / Self Care | Payer: Medicare Other | Source: Ambulatory Visit | Attending: Family Medicine | Admitting: Family Medicine

## 2021-07-22 DIAGNOSIS — N289 Disorder of kidney and ureter, unspecified: Secondary | ICD-10-CM

## 2021-07-23 ENCOUNTER — Other Ambulatory Visit: Payer: Self-pay | Admitting: Family Medicine

## 2021-07-23 DIAGNOSIS — N2889 Other specified disorders of kidney and ureter: Secondary | ICD-10-CM

## 2021-07-23 NOTE — Progress Notes (Signed)
See ultrasound, ct abd notes.

## 2021-07-29 ENCOUNTER — Encounter: Payer: Self-pay | Admitting: Family Medicine

## 2021-07-29 ENCOUNTER — Ambulatory Visit (INDEPENDENT_AMBULATORY_CARE_PROVIDER_SITE_OTHER): Payer: Medicare Other | Admitting: Family Medicine

## 2021-07-29 VITALS — BP 128/70 | HR 70 | Temp 98.2°F | Resp 16 | Ht 71.0 in | Wt 226.8 lb

## 2021-07-29 DIAGNOSIS — M79602 Pain in left arm: Secondary | ICD-10-CM

## 2021-07-29 DIAGNOSIS — R079 Chest pain, unspecified: Secondary | ICD-10-CM | POA: Diagnosis not present

## 2021-07-29 DIAGNOSIS — R202 Paresthesia of skin: Secondary | ICD-10-CM

## 2021-07-29 MED ORDER — PREDNISONE 20 MG PO TABS: ORAL_TABLET | ORAL | 0 refills | Status: DC

## 2021-07-29 MED ORDER — GABAPENTIN 100 MG PO CAPS: 100.0000 mg | ORAL_CAPSULE | Freq: Three times a day (TID) | ORAL | 3 refills | Status: DC

## 2021-07-29 NOTE — Progress Notes (Signed)
Subjective:  Patient ID: Carl Gutierrez, male    DOB: 1946/03/20  Age: 76 y.o. MRN: [id number]  CC:  Chief Complaint  Patient presents with   Chest Pain    Pt was here 2 weeks ago for pain in the chest, ECG performed. Notes no changes     HPI [name] presents for   Chest pain Follow-up from January 30 visit.  Had been seen in ER multiple times, as well as cardiology with stress testing.  Not thought to be acute coronary syndrome given work-up and reassuring stress test previously.  Based on his reassuring eval and reassuring, normal troponins, recent cardiac testing and reproducibility on exam last visit, less likely cardiac cause.  Did have some discomfort on the left neck, left upper arm, upper chest and upper back that possibly was related to cervical spine.  Possible radiculopathy.  Did have some improvement at that visit.  We discussed gentle range of motion, low-dose Flexeril if needed.  Possible prednisone discussed but he is on hydrocortisone for hypopituitarism.  There was some scarring or possible areas of scar on his lung with plan for repeat CT in 3 months to evaluate those probable areas of scar.   Feels about the same as last visit.  No change with flexeril 3 times per day. Still feeling pain radiating down left arm, upper chest muscles and upper back feels burning. Numb feeling into back of hand and fingers at times.  No weakness, not dropping objects.    Also referred for imaging for left renal lesion.  Initial ultrasound performed, further clarification if MRI needed.  Imaging pending - plans on MRi in 2 days.   History Patient Active Problem List   Diagnosis Date Noted   Gout of big toe 10/31/2020   Stage 3a chronic kidney disease ([place]) 01/04/2020   Anemia in chronic kidney disease 11/30/2015   Recurrent deep vein thrombosis (DVT) ([place]) 10/01/2015   Hematuria, unspecified 10/01/2015   Elevated serum creatinine 10/01/2015   BPH (benign prostatic hyperplasia)  08/13/2015   Benign localized hyperplasia of prostate with urinary obstruction 07/23/2015   Preop cardiovascular exam 07/17/2015   Encounter for CDL (commercial driving license) exam 71/24/5809   Obesity (BMI 30-39.9) 06/26/2013   Essential hypertension 06/26/2013   Panhypopituitarism ([place]) 11/17/2011     11/17/2011   Diabetes mellitus ([place]) 11/17/2011   Hyperlipidemia associated with type 2 diabetes mellitus ([place]) 11/17/2011   CAD S/P percutaneous coronary angioplasty 07/19/2002   Past Medical History:  Diagnosis Date   CAD S/P percutaneous coronary angioplasty February 2004; 2008   PCI-dLAD: 2.25 mm x 16 mm Express BMS; Ramus- 2.75 mm x 18 mm Cypher DES (2.8 mm); follow-up 11/'08: Patent LAD/ramus stents. 90% small OM. EF 50-60%.; Myoview 1/'12: Exercise 9 min, 10 METS with no ischemia/infarction.    Clotting disorder (HCC)    Diabetes mellitus    Low-dose metformin   DJD (degenerative joint disease), lumbosacral     status post L4-L5 surgery   Essential hypertension    History of DVT of lower extremity     postoperative   Hyperlipidemia with target LDL less than 70    Hypothyroidism    Synthroid   Panhypopituitarism ([place])    Status post pituitary adenoma removal in 2012   Recurrent deep vein thrombosis (DVT) ([place]) 10/01/2015   Past Surgical History:  Procedure Laterality Date   BRAIN SURGERY  2012   Removal of pituitary adenoma   CARDIAC CATHETERIZATION  November 2008  Significant LAD tapering but no significant disease the distal stent. Patent Ramus stent. 90% lesion in small OM 2; small nondominant RCA. Medical therapy. EF 50-60%.   CORONARY ANGIOPLASTY WITH STENT PLACEMENT  February 2004   PCI-dLAD: 2.25 mm x 16 mm BMS;;PCI- RI: 2.75 mm 18 mm Cypher DES   NM MYOVIEW LTD  January 2012   Exercise ~9 min - 10 METS; no ischemia or infarction   PROSTATECTOMY N/A 08/13/2015   Procedure: TRANSVESICLE OPEN PROSTATECTOMY** ;  Surgeon: Carolan Clines, MD;  Location: [facility];   Service: Urology;  Laterality: N/A;   SPINE SURGERY     L4-L5   Allergies  Allergen Reactions   Hydrochlorothiazide     Leg and side pain with this   Other     Blood pressure med name unknown.   Prior to Admission medications   Medication Sig Start Date End Date Taking? Authorizing Provider  amLODipine (NORVASC) 10 MG tablet Take 1 tablet (10 mg total) by mouth daily. 06/19/21  Yes Wendie Agreste, MD  aspirin 81 MG tablet Take 81 mg by mouth daily. Reported on 09/05/2015   Yes [provider]  B-D UF III MINI PEN NEEDLES 31G X 5 MM MISC USE FOR INSULIN PEN TWICE A DAY 04/29/21  Yes Elayne Snare, MD  colchicine 0.6 MG tablet 2 tablets at onset of foot pain, take third tablet after 1 hour and then 1 tablet daily with food until pain improved Patient taking differently: Take 0.6 mg by mouth See admin instructions. Take 2 tablets by mouth for onset of foot pain, take third tablet after 1 hour and then take 1 tablet by mouthdaily with food until pain improved 04/24/21  Yes Elayne Snare, MD  cyclobenzaprine (FLEXERIL) 5 MG tablet Take 1 tablet (5 mg total) by mouth 3 (three) times daily as needed for muscle spasms (start qhs prn due to sedation). 07/15/21  Yes Wendie Agreste, MD  HYDROcodone-acetaminophen (NORCO) 5-325 MG tablet Take 1 tablet by mouth every 8 (eight) hours as needed for moderate pain. 07/08/21  Yes Horton, Kristie M, DO  hydrocortisone (CORTEF) 20 MG tablet Take 10-20 mg by mouth See admin instructions. Takes 1 tablet 20 mg) by mouth in the morning and 1/2 tablet (10 mg )by mouth at night   Yes [provider]  Insulin Lispro Prot & Lispro (HUMALOG 75/25 MIX) (75-25) 100 UNIT/ML Kwikpen INJECT 20 UNITS UNDER THE SKIN BEFORE BREAKFAST AND 6 UNITS BEFORE SUPPER. (REPLACES 70/30) Patient taking differently: Inject 8-20 Units into the skin See admin instructions. Inject 20 units under the skin before breakfast and 68 units before supper. 04/29/21  Yes Elayne Snare, MD   JANUMET XR [phone] MG TB24 TAKE 1 TABLET BY MOUTH EVERY DAY Patient taking differently: Take 1 tablet by mouth every evening. 05/30/21  Yes Elayne Snare, MD  levothyroxine (SYNTHROID) 88 MCG tablet Take 1 tablet (88 mcg total) by mouth daily. 06/19/21  Yes Wendie Agreste, MD  lisinopril (ZESTRIL) 5 MG tablet Take 1 tablet (5 mg total) by mouth daily. 10/31/20  Yes Elayne Snare, MD  metoprolol succinate (TOPROL-XL) 25 MG 24 hr tablet Take 1 tablet (25 mg total) by mouth daily. 06/19/21  Yes Wendie Agreste, MD  nitroGLYCERIN (NITROSTAT) 0.4 MG SL tablet Place 1 tablet (0.4 mg total) under the tongue every 5 (five) minutes as needed for chest pain. 07/01/21 09/29/21 Yes Martinique, Peter M, MD  [facility] VERIO test strip USE AS INSTRUCTED TO CHECK BLOOD SUGAR  ONCE DAILY. 11/23/20  Yes Elayne Snare, MD  rosuvastatin (CRESTOR) 40 MG tablet TAKE 1 TABLET BY MOUTH EVERY DAY Patient taking differently: Take 40 mg by mouth every evening. 06/02/21  Yes Wendie Agreste, MD  testosterone cypionate (DEPO-TESTOSTERONE) 200 MG/ML injection Inject 1.5 mLs (300 mg total) into the muscle every 21 ( twenty-one) days. 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 Exercises only occasionally. Not as much as he desires 2. Works as a Scientist, clinical (histocompatibility and immunogenetics).   He does not smoke cigarettes -- quit in 1995. Does not drink alcohol.   Social Determinants of Health   Financial Resource Strain: Not on file  Food Insecurity: Not on file  Transportation Needs: Not on file  Physical Activity: Not on file  Stress: Not on file  Social Connections: Not on file  Intimate Partner Violence: Not on file    Review of Systems  Per HPI.  Objective:   Vitals:   07/29/21 0853  BP: 128/70  Pulse: 70  Resp: 16  Temp: 98.2 F (36.8 C)  TempSrc: Temporal  SpO2: 96%  Weight: 226 lb 12.8 oz (102.9 kg)  Height: 5\' 11"  (1.803 m)     Physical Exam Constitutional:      General: He is not in acute distress.    Appearance: Normal appearance. He is well-developed.  HENT:     Head: Normocephalic and atraumatic.  Cardiovascular:     Rate and Rhythm: Normal rate.     Heart sounds: No murmur heard. Pulmonary:     Effort: Pulmonary effort is normal.     Breath sounds: Normal breath sounds.  Musculoskeletal:     Comments: C-spine, decreased motion diffusely, some discomfort left paraspinals with left lateral flexion/rotation.  Denies change of symptoms in arm, back, chest but has persistent discomfort throughout left arm.  No focal bony tenderness of neck, shoulder, arm.  Cap refill less than 1 second at fingertips.  Grip strength, upper extremity strength equal bilaterally.  Some difficulty with upper extremity reflexes bilaterally.  Hand veins flat, no apparent swelling.  Skin:    Capillary Refill: Capillary refill takes less than 2 seconds.  Neurological:     Mental Status: He is alert and oriented to person, place, and time.  Psychiatric:        Mood and Affect: Mood normal.     Assessment & Plan:  Carl Gutierrez is a 76 y.o. male . Chest pain, unspecified type - Plan: gabapentin (NEURONTIN) 100 MG capsule, predniSONE (DELTASONE) 20 MG tablet, DG Cervical Spine Complete, CANCELED: DG Cervical Spine Complete  Pain of left upper  extremity - Plan: gabapentin (NEURONTIN) 100 MG capsule, predniSONE (DELTASONE) 20 MG tablet, DG Cervical Spine Complete, CANCELED: DG Cervical Spine Complete  Left hand paresthesia - Plan: gabapentin (NEURONTIN) 100 MG capsule, predniSONE (DELTASONE) 20 MG tablet, DG Cervical Spine Complete, CANCELED: DG Cervical Spine Complete  Previous evaluation as above.  Appears to be musculoskeletal and symptoms indicate likely radiculopathy.  Minimal change with previous hydrocodone for muscle relaxant.  Persistent symptoms.  No weakness appreciated.  Discussed with his endocrinologist and clearance given to treat with prednisone, continue hydrocortisone, with close monitoring of glucose and plan for call to his endocrinologist if readings above 200.  Potential side effects of prednisone discussed.  Taper as above.  Check cervical spine XR initially, consider MRI. Also start gabapentin for possible neuropathic symptoms with potential side effects and increasing doses if needed.  Update on symptoms in the next 4 to 5 days to decide on next step including possible orthopedic referral.   Meds ordered this encounter  Medications   gabapentin (NEURONTIN) 100 MG capsule    Sig: Take 1 capsule (100 mg total) by mouth 3 (three) times daily. Start QD for first few days, then twice per day. If needed after another week can increase to 3 times per day.    Dispense:  90 capsule    Refill:  3   predniSONE (DELTASONE) 20 MG tablet    Sig: 3 by mouth for 3 days, then 2 by mouth for 2 days, then 1 by mouth for 2 days, then 1/2 by mouth for 2 days.    Dispense:  16 tablet    Refill:  0   Patient Instructions  You still may have a pinched neve in the neck.  Start prednisone for possible pinched nerve. Your endocrinologist said this is ok with your hydrocortisone, but if blood sugars above 200 call endocrinologist to adjust meds.  I will also start gabapentin for nerve pain. Start once per day then increase to twice per day  if tolerated after a few days. After 1 week, can increase to 3 times per day.  Neck xray on Wednesday when you have you MRI.  If not improving this week let me know and I will have you seen by orthopaedist or other testing.  Thanks for coming in today.       Signed,   [name], MD [place] 07/29/21 10:01 AM

## 2021-07-29 NOTE — Patient Instructions (Addendum)
You still may have a pinched neve in the neck.  Start prednisone for possible pinched nerve. Your endocrinologist said this is ok with your hydrocortisone, but if blood sugars above 200 call endocrinologist to adjust meds.  I will also start gabapentin for nerve pain. Start once per day then increase to twice per day if tolerated after a few days. After 1 week, can increase to 3 times per day.  Neck xray on Wednesday when you have you MRI.  If not improving this week let me know and I will have you seen by orthopaedist or other testing.  Thanks for coming in today.

## 2021-07-31 ENCOUNTER — Ambulatory Visit
Admission: RE | Admit: 2021-07-31 | Discharge: 2021-07-31 | Disposition: A | Discharge status: Home / Self Care | Payer: Medicare Other | Source: Ambulatory Visit | Attending: Family Medicine | Admitting: Family Medicine

## 2021-07-31 DIAGNOSIS — R202 Paresthesia of skin: Secondary | ICD-10-CM

## 2021-07-31 DIAGNOSIS — M79602 Pain in left arm: Secondary | ICD-10-CM

## 2021-07-31 DIAGNOSIS — N2889 Other specified disorders of kidney and ureter: Secondary | ICD-10-CM

## 2021-07-31 DIAGNOSIS — R079 Chest pain, unspecified: Secondary | ICD-10-CM

## 2021-07-31 MED ORDER — GADOBENATE DIMEGLUMINE 529 MG/ML IV SOLN
20.0000 mL | Freq: Once | INTRAVENOUS | Status: AC | PRN
  Administered 2021-07-31: 20 mL via INTRAVENOUS

## 2021-08-02 ENCOUNTER — Telehealth: Payer: Self-pay

## 2021-08-02 NOTE — Telephone Encounter (Signed)
-----   Message from Wendie Agreste, MD sent at 08/01/2021  8:46 AM EST ----- Call patient there were some degenerative changes/arthritis type changes seen in the neck but no acute abnormalities.  Those degenerative changes may be contributing to some of his symptoms.  Let me know how things are going with the addition of prednisone.

## 2021-08-03 ENCOUNTER — Other Ambulatory Visit: Payer: Self-pay | Admitting: Endocrinology

## 2021-08-03 ENCOUNTER — Other Ambulatory Visit: Payer: Self-pay | Admitting: Family Medicine

## 2021-08-03 DIAGNOSIS — N2889 Other specified disorders of kidney and ureter: Secondary | ICD-10-CM

## 2021-08-03 NOTE — Progress Notes (Signed)
See MRI results. Referred to urology for left renal mass.

## 2021-08-07 ENCOUNTER — Other Ambulatory Visit: Payer: Self-pay

## 2021-08-07 ENCOUNTER — Ambulatory Visit (INDEPENDENT_AMBULATORY_CARE_PROVIDER_SITE_OTHER): Payer: Medicare Other | Admitting: Cardiology

## 2021-08-07 ENCOUNTER — Encounter: Payer: Self-pay | Admitting: Cardiology

## 2021-08-07 VITALS — BP 124/66 | HR 61 | Ht 71.0 in | Wt 228.4 lb

## 2021-08-07 DIAGNOSIS — I10 Essential (primary) hypertension: Secondary | ICD-10-CM | POA: Diagnosis not present

## 2021-08-07 DIAGNOSIS — Z9861 Coronary angioplasty status: Secondary | ICD-10-CM

## 2021-08-07 DIAGNOSIS — E1169 Type 2 diabetes mellitus with other specified complication: Secondary | ICD-10-CM | POA: Diagnosis not present

## 2021-08-07 DIAGNOSIS — R079 Chest pain, unspecified: Secondary | ICD-10-CM

## 2021-08-07 DIAGNOSIS — E785 Hyperlipidemia, unspecified: Secondary | ICD-10-CM

## 2021-08-07 DIAGNOSIS — I2510 Atherosclerotic heart disease of native coronary artery without angina pectoris: Secondary | ICD-10-CM

## 2021-08-07 DIAGNOSIS — N1831 Chronic kidney disease, stage 3a: Secondary | ICD-10-CM

## 2021-08-07 NOTE — Progress Notes (Unsigned)
Primary Care Provider: Wendie Agreste, MD Cardiologist: Glenetta Hew, MD Electrophysiologist: None  Clinic Note: No chief complaint on file.   ===================================  ASSESSMENT/PLAN   Problem List Items Addressed This Visit       Cardiology Problems   CAD S/P percutaneous coronary angioplasty (Chronic)   Hyperlipidemia associated with type 2 diabetes mellitus ([place]) - Primary (Chronic)   Essential hypertension (Chronic)    ===================================  HPI:    Carl Gutierrez is a 76 y.o. male with a PMH notable for CAD-PCI with CRS of HTN and HLD who also has panhypopituitarism  who presents today for close follow-up after ER visit and stress test  I last saw Carl Gutierrez in February 2022.  Carl Gutierrez was last seen on on July 01, 2021 by Dr. Martinique as a DOD work inpatient for chest pain.  Left axillary pain for 3 to 3 days dull aching on the comes and goes.  Lasts 10 to 15 minutes.  Lexiscan Myoview ordered.  Recent Hospitalizations:  ER evaluation for chest pain on 07/01/2021 and 07/08/2021.  Reviewed  CV studies:    The following studies were reviewed today: (if available, images/films reviewed: From Epic Chart or Care Everywhere) [place] 07/03/2021: LOW RISK.  Normal LV size and function.  EF 55 to 60%.  No RWMA.  No ischemia or infarct.  Fixed inferior basal apical defect with normal motion suggestive of artifact.  (Diaphragmatic attenuation)   Interval History:   Carl Gutierrez   CV Review of Symptoms (Summary) Cardiovascular ROS: {[id number]}  REVIEWED OF SYSTEMS   ROS  I have reviewed and (if needed) personally updated the patient's problem list, medications, allergies, past medical and surgical history, social and family history.   PAST MEDICAL HISTORY   Past Medical History:  Diagnosis Date   CAD S/P percutaneous coronary angioplasty February 2004; 2008   PCI-dLAD: 2.25 mm x 16 mm Express BMS; Ramus- 2.75 mm x 18 mm  Cypher DES (2.8 mm); follow-up 11/'08: Patent LAD/ramus stents. 90% small OM. EF 50-60%.; Myoview 1/'12: Exercise 9 min, 10 METS with no ischemia/infarction.    Clotting disorder (HCC)    Diabetes mellitus    Low-dose metformin   DJD (degenerative joint disease), lumbosacral     status post L4-L5 surgery   Essential hypertension    History of DVT of lower extremity     postoperative   Hyperlipidemia with target LDL less than 70    Hypothyroidism    Synthroid   Panhypopituitarism ([place])    Status post pituitary adenoma removal in 2012   Recurrent deep vein thrombosis (DVT) ([place]) 10/01/2015    PAST SURGICAL HISTORY   Past Surgical History:  Procedure Laterality Date   BRAIN SURGERY  2012   Removal of pituitary adenoma   CARDIAC CATHETERIZATION  November 2008   Significant LAD tapering but no significant disease the distal stent. Patent Ramus stent. 90% lesion in small OM 2; small nondominant RCA. Medical therapy. EF 50-60%.   CORONARY ANGIOPLASTY WITH STENT PLACEMENT  February 2004   PCI-dLAD: 2.25 mm x 16 mm BMS;;PCI- RI: 2.75 mm 18 mm Cypher DES   NM MYOVIEW LTD  January 2012   Exercise ~9 min - 10 METS; no ischemia or infarction   PROSTATECTOMY N/A 08/13/2015   Procedure: TRANSVESICLE OPEN PROSTATECTOMY** ;  Surgeon: Carolan Clines, MD;  Location: [facility];  Service: Urology;  Laterality: N/A;   SPINE SURGERY     L4-L5    Immunization History  Administered  Date(s) Administered   Fluad Quad(high Dose 65+) 02/20/2019   Influenza Split 03/05/2020   Influenza, High Dose Seasonal PF 04/03/2015, 04/02/2017   Influenza-Unspecified 03/16/2013, 04/05/2014, 04/03/2015, 04/22/2016, 03/07/2021   Moderna Sars-Covid-2 Vaccination 07/19/2019, 08/18/2019, 04/12/2020, 09/28/2020   Pneumococcal Conjugate-13 01/22/2015   Pneumococcal Polysaccharide-23 06/16/2009   Tdap 06/16/2005    MEDICATIONS/ALLERGIES   Current Meds  Medication Sig   amLODipine (NORVASC) 10 MG tablet Take 1 tablet  (10 mg total) by mouth daily.   aspirin 81 MG tablet Take 81 mg by mouth daily. Reported on 09/05/2015   B-D UF III MINI PEN NEEDLES 31G X 5 MM MISC USE FOR INSULIN PEN TWICE A DAY   gabapentin (NEURONTIN) 100 MG capsule Take 1 capsule (100 mg total) by mouth 3 (three) times daily. Start QD for first few days, then twice per day. If needed after another week can increase to 3 times per day.   hydrocortisone (CORTEF) 20 MG tablet Take 10-20 mg by mouth See admin instructions. Takes 1 tablet 20 mg) by mouth in the morning and 1/2 tablet (10 mg )by mouth at night   Insulin Lispro Prot & Lispro (HUMALOG 75/25 MIX) (75-25) 100 UNIT/ML Kwikpen INJECT 20 UNITS UNDER THE SKIN BEFORE BREAKFAST AND 6 UNITS BEFORE SUPPER. (REPLACES 70/30) (Patient taking differently: Inject 8-20 Units into the skin See admin instructions. Inject 24 units before breakfast, 8 units before dinner)   JANUMET XR [phone] MG TB24 TAKE 1 TABLET BY MOUTH EVERY DAY (Patient taking differently: Take 1 tablet by mouth every evening.)   levothyroxine (SYNTHROID) 88 MCG tablet Take 1 tablet (88 mcg total) by mouth daily.   lisinopril (ZESTRIL) 5 MG tablet TAKE 1 TABLET (5 MG TOTAL) BY MOUTH DAILY.   metoprolol succinate (TOPROL-XL) 25 MG 24 hr tablet Take 1 tablet (25 mg total) by mouth daily.   nitroGLYCERIN (NITROSTAT) 0.4 MG SL tablet Place 1 tablet (0.4 mg total) under the tongue every 5 (five) minutes as needed for chest pain.   ONETOUCH VERIO test strip USE AS INSTRUCTED TO CHECK BLOOD SUGAR ONCE DAILY.   rosuvastatin (CRESTOR) 40 MG tablet TAKE 1 TABLET BY MOUTH EVERY DAY (Patient taking differently: Take 40 mg by mouth every evening.)    Allergies  Allergen Reactions   Hydrochlorothiazide     Leg and side pain with this   Other     Blood pressure med name unknown.    SOCIAL HISTORY/FAMILY HISTORY   Reviewed in Epic:  Pertinent findings:  Social History   Tobacco Use   Smoking status: Former    Types: Cigarettes     Quit date: 06/24/1993    Years since quitting: 28.1   Smokeless tobacco: Never  Vaping Use   Vaping Use: Never used  Substance Use Topics   Alcohol use: No   Drug use: No   Social History   Social History Narrative   He is a married father of 2, grandfather 25. Exercises only occasionally. Not as much as he desires 2. 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   He does not smoke cigarettes -- quit in 1995. Does not drink alcohol.    OBJCTIVE -PE, EKG, labs   Wt Readings from Last 3 Encounters:  08/07/21 228 lb 6.4 oz (103.6 kg)  07/29/21 226 lb 12.8 oz (102.9 kg)  07/15/21 225 lb 9.6 oz (102.3 kg)    Physical Exam: BP 124/66    Pulse 61    Ht 5\' 11"  (1.803 m)  Wt 228 lb 6.4 oz (103.6 kg)    SpO2 96%    BMI 31.86 kg/m  Physical Exam   Adult ECG Report  Rate: 61 ;  Rhythm: normal sinus rhythm and premature atrial contractions (PAC); normal axis intervals & durations  Narrative Interpretation: stble  Recent Labs:  reviewed   Lab Results  Component Value Date   CHOL 121 06/19/2021   HDL 33.90 (L) 06/19/2021   LDLCALC 64 06/19/2021   TRIG 118.0 06/19/2021   CHOLHDL 4 06/19/2021   Lab Results  Component Value Date   CREATININE 1.56 (H) 07/08/2021   BUN 23 07/08/2021   NA 137 07/08/2021   K 4.2 07/08/2021   CL 110 07/08/2021   CO2 21 (L) 07/08/2021   CBC Latest Ref Rng & Units 07/15/2021 07/08/2021 10/26/2018  WBC 4.0 - 10.5 K/uL 11.4(H) 11.2(H) 7.8  Hemoglobin 13.0 - 17.0 g/dL 13.2 13.2 13.6  Hematocrit 39.0 - 52.0 % 40.3 42.2 40.3  Platelets 150.0 - 400.0 K/uL 357.0 362 298.0    Lab Results  Component Value Date   HGBA1C 7.6 (H) 04/19/2021   Lab Results  Component Value Date   TSH 4.57 04/19/2021    ==================================================  COVID-19 Education: The signs and symptoms of COVID-19 were discussed with the patient and how to seek care for testing (follow up with PCP or arrange E-visit).    I spent a total of ***minutes with the patient  spent in direct patient consultation.  Additional time spent with chart review  / charting (studies, outside notes, etc): *** min Total Time: *** min  Current medicines are reviewed at length with the patient today.  (+/- concerns) ***  This visit occurred during the SARS-CoV-2 public health emergency.  Safety protocols were in place, including screening questions prior to the visit, additional usage of staff PPE, and extensive cleaning of exam room while observing appropriate contact time as indicated for disinfecting solutions.  Notice: This dictation was prepared with Dragon dictation along with smart phrase technology. Any transcriptional errors that result from this process are unintentional and may not be corrected upon review.  Studies Ordered:   No orders of the defined types were placed in this encounter.   Patient Instructions / Medication Changes & Studies & Tests Ordered   There are no Patient Instructions on file for this visit.     Glenetta Hew, M.D., M.S. Interventional Cardiologist   Pager # [phone] Phone # [phone] [address] [place], [place] [address]   Thank you for choosing Heartcare at [facility]!!

## 2021-08-07 NOTE — Patient Instructions (Addendum)
Medication Instructions:  No changes *If you need a refill on your cardiac medications before your next appointment, please call your pharmacy*   Lab Work: Not needed   Testing/Procedures: Not needed   Follow-Up: At [facility], you and your health needs are our priority.  As part of our continuing mission to provide you with exceptional heart care, we have created designated Provider Care Teams.  These Care Teams include your primary Cardiologist (physician) and Advanced Practice Providers (APPs -  Physician Assistants and Nurse Practitioners) who all work together to provide you with the care you need, when you need it.  We recommend signing up for the patient portal called "MyChart".  Sign up information is provided on this After Visit Summary.  MyChart is used to connect with patients for Virtual Visits (Telemedicine).  Patients are able to view lab/test results, encounter notes, upcoming appointments, etc.  Non-urgent messages can be sent to your provider as well.   To learn more about what you can do with MyChart, go to [email or web address].    Your next appointment:   12 month(s) same day as wife   The format for your next appointment:   In Person  Provider:   Glenetta Hew, MD

## 2021-08-20 ENCOUNTER — Other Ambulatory Visit (INDEPENDENT_AMBULATORY_CARE_PROVIDER_SITE_OTHER): Payer: Medicare Other

## 2021-08-20 ENCOUNTER — Other Ambulatory Visit: Payer: Self-pay

## 2021-08-20 DIAGNOSIS — E230 Hypopituitarism: Secondary | ICD-10-CM | POA: Diagnosis not present

## 2021-08-20 DIAGNOSIS — E1165 Type 2 diabetes mellitus with hyperglycemia: Secondary | ICD-10-CM

## 2021-08-20 DIAGNOSIS — Z794 Long term (current) use of insulin: Secondary | ICD-10-CM | POA: Diagnosis not present

## 2021-08-20 LAB — BASIC METABOLIC PANEL
BUN: 27 mg/dL — ABNORMAL HIGH (ref 6–23)
CO2: 24 mEq/L (ref 19–32)
Calcium: 9.5 mg/dL (ref 8.4–10.5)
Chloride: 107 mEq/L (ref 96–112)
Creatinine, Ser: 1.6 mg/dL — ABNORMAL HIGH (ref 0.40–1.50)
GFR: 41.24 mL/min — ABNORMAL LOW (ref >60.00)
Glucose, Bld: 118 mg/dL — ABNORMAL HIGH (ref 70–99)
Potassium: 4.4 mEq/L (ref 3.5–5.1)
Sodium: 140 mEq/L (ref 135–145)

## 2021-08-20 LAB — T4, FREE: Free T4: 1.11 ng/dL (ref 0.60–1.60)

## 2021-08-20 LAB — HEMOGLOBIN A1C: Hgb A1c MFr Bld: 8.1 % — ABNORMAL HIGH (ref 4.6–6.5)

## 2021-08-22 ENCOUNTER — Ambulatory Visit: Payer: Medicare Other | Admitting: Endocrinology

## 2021-08-22 ENCOUNTER — Other Ambulatory Visit: Payer: Self-pay

## 2021-08-22 ENCOUNTER — Encounter: Payer: Self-pay | Admitting: Endocrinology

## 2021-08-22 VITALS — BP 118/72 | HR 76 | Ht 71.75 in | Wt 229.0 lb

## 2021-08-22 DIAGNOSIS — N1831 Chronic kidney disease, stage 3a: Secondary | ICD-10-CM

## 2021-08-22 DIAGNOSIS — I10 Essential (primary) hypertension: Secondary | ICD-10-CM

## 2021-08-22 DIAGNOSIS — E230 Hypopituitarism: Secondary | ICD-10-CM

## 2021-08-22 DIAGNOSIS — E1165 Type 2 diabetes mellitus with hyperglycemia: Secondary | ICD-10-CM

## 2021-08-22 DIAGNOSIS — E1122 Type 2 diabetes mellitus with diabetic chronic kidney disease: Secondary | ICD-10-CM

## 2021-08-22 DIAGNOSIS — Z794 Long term (current) use of insulin: Secondary | ICD-10-CM

## 2021-08-22 MED ORDER — INSULIN LISPRO PROT & LISPRO (75-25 MIX) 100 UNIT/ML KWIKPEN: PEN_INJECTOR | SUBCUTANEOUS | 2 refills | Status: DC

## 2021-08-22 MED ORDER — XIGDUO XR 5-1000 MG PO TB24: 1.0000 | ORAL_TABLET | Freq: Every day | ORAL | 3 refills | Status: DC

## 2021-08-22 NOTE — Progress Notes (Signed)
Patient ID: Carl Gutierrez, male   DOB: January 05, 1946, 76 y.o.   MRN: [id number]           Reason for Appointment: Endocrinology follow-up  History of Present Illness   DIABETES type II:  Recent history:     INSULIN regimen: Humalog 75/25 insulin, 24 units before breakfast and 8 before dinner  Non-insulin hypoglycemic drugs: Janumet XR 100/1000 daily  His A1c is 8.1, was 7.6 before   Current management, blood sugar patterns and problems identified: He did bring his monitor for download today His blood sugars went up to as much as 323 after getting some prednisone for his cervical spondylosis last month However he still tends to have higher readings in the afternoon and morning readings are usually fairly good Again not able to lose weight or exercise much and weight is trending higher More recently no consistent pattern is seen with his blood sugars but he is checking blood sugars infrequently and more in the morning Lab glucose was 118 fasting He is usually trying to take his insulin doses before starting to eat both morning and evening   Side effects from medications: None      Monitors blood glucose:  Once or twice a day.    Glucometer:  One Touch           Blood Glucose readings from download:   PRE-MEAL Fasting Lunch Dinner Bedtime Overall  Glucose range: 100-123 124, 246 136-319    Mean/median:     156   POST-MEAL PC Breakfast PC Lunch PC Dinner  Glucose range:   135-323  Mean/median:      Previously:   PRE-MEAL Fasting Lunch Dinner Bedtime Overall  Glucose range:     100-277  Mean/median: 115    147/126   POST-MEAL PC Breakfast PC Lunch PC Dinner  Glucose range:     Mean/median:  170 153     Dietician visit: Most recent: 11/2012     Weight history   Wt Readings from Last 3 Encounters:  08/22/21 229 lb (103.9 kg)  08/07/21 228 lb 6.4 oz (103.6 kg)  07/29/21 226 lb 12.8 oz (102.9 kg)           Diabetes labs:  Lab Results  Component Value Date    HGBA1C 8.1 (H) 08/20/2021   HGBA1C 7.6 (H) 04/19/2021   HGBA1C 7.6 (H) 01/24/2021   Lab Results  Component Value Date   MICROALBUR 4.5 (H) 04/19/2021   LDLCALC 64 06/19/2021   CREATININE 1.60 (H) 08/20/2021   Other problems discussed today: See review of systems  Allergies as of 08/22/2021       Reactions   Hydrochlorothiazide    Leg and side pain with this   Other    Blood pressure med name unknown.        Medication List        Accurate as of August 22, 2021  1:53 PM. If you have any questions, ask your nurse or doctor.          STOP taking these medications    HYDROcodone-acetaminophen 5-325 MG tablet Commonly known as: Norco Stopped by: Elayne Snare, MD   Janumet XR [phone] MG Tb24 Generic drug: SitaGLIPtin-MetFORMIN HCl Stopped by: Elayne Snare, MD       TAKE these medications    amLODipine 10 MG tablet Commonly known as: NORVASC Take 1 tablet (10 mg total) by mouth daily.   aspirin 81 MG tablet Take 81 mg by mouth daily. Reported on  09/05/2015   B-D UF III MINI PEN NEEDLES 31G X 5 MM Misc Generic drug: Insulin Pen Needle USE FOR INSULIN PEN TWICE A DAY   colchicine 0.6 MG tablet 2 tablets at onset of foot pain, take third tablet after 1 hour and then 1 tablet daily with food until pain improved   cyclobenzaprine 5 MG tablet Commonly known as: FLEXERIL Take 1 tablet (5 mg total) by mouth 3 (three) times daily as needed for muscle spasms (start qhs prn due to sedation).   gabapentin 100 MG capsule Commonly known as: NEURONTIN Take 1 capsule (100 mg total) by mouth 3 (three) times daily. Start QD for first few days, then twice per day. If needed after another week can increase to 3 times per day.   hydrocortisone 20 MG tablet Commonly known as: CORTEF Take 10-20 mg by mouth See admin instructions. Takes 1 tablet 20 mg) by mouth in the morning and 1/2 tablet (10 mg )by mouth at night   Insulin Lispro Prot & Lispro (75-25) 100 UNIT/ML  Kwikpen Commonly known as: HUMALOG 75/25 MIX 26 Units in am and 8 ac supper What changed: See the new instructions. Changed by: Elayne Snare, MD   levothyroxine 88 MCG tablet Commonly known as: SYNTHROID Take 1 tablet (88 mcg total) by mouth daily.   lisinopril 5 MG tablet Commonly known as: ZESTRIL TAKE 1 TABLET (5 MG TOTAL) BY MOUTH DAILY.   metoprolol succinate 25 MG 24 hr tablet Commonly known as: TOPROL-XL Take 1 tablet (25 mg total) by mouth daily.   nitroGLYCERIN 0.4 MG SL tablet Commonly known as: NITROSTAT Place 1 tablet (0.4 mg total) under the tongue every 5 (five) minutes as needed for chest pain.   OneTouch Verio test strip Generic drug: glucose blood USE AS INSTRUCTED TO CHECK BLOOD SUGAR ONCE DAILY.   Pharmacist Choice Alcohol Pads SMARTSIG:1 Each Topical 4 Times Daily   predniSONE 20 MG tablet Commonly known as: DELTASONE 3 by mouth for 3 days, then 2 by mouth for 2 days, then 1 by mouth for 2 days, then 1/2 by mouth for 2 days.   rosuvastatin 40 MG tablet Commonly known as: CRESTOR TAKE 1 TABLET BY MOUTH EVERY DAY What changed: when to take this   Xigduo XR 10-998 MG Tb24 Generic drug: Dapagliflozin-metFORMIN HCl ER Take 1 tablet by mouth daily. Started by: Elayne Snare, MD        Allergies:  Allergies  Allergen Reactions   Hydrochlorothiazide     Leg and side pain with this   Other     Blood pressure med name unknown.    Past Medical History:  Diagnosis Date   CAD S/P percutaneous coronary angioplasty February 2004; 2008   PCI-dLAD: 2.25 mm x 16 mm Express BMS; Ramus- 2.75 mm x 18 mm Cypher DES (2.8 mm); follow-up 11/'08: Patent LAD/ramus stents. 90% small OM. EF 50-60%.; Myoview 1/'12: Exercise 9 min, 10 METS with no ischemia/infarction.    Clotting disorder (HCC)    Diabetes mellitus    Low-dose metformin   DJD (degenerative joint disease), lumbosacral     status post L4-L5 surgery   Essential hypertension    History of DVT of lower  extremity     postoperative   Hyperlipidemia with target LDL less than 70    Hypothyroidism    Synthroid   Panhypopituitarism ([place])    Status post pituitary adenoma removal in 2012   Recurrent deep vein thrombosis (DVT) ([place]) 10/01/2015    Past  Surgical History:  Procedure Laterality Date   BRAIN SURGERY  2012   Removal of pituitary adenoma   CARDIAC CATHETERIZATION  November 2008   Significant LAD tapering but no significant disease the distal stent. Patent Ramus stent. 90% lesion in small OM 2; small nondominant RCA. Medical therapy. EF 50-60%.   CORONARY ANGIOPLASTY WITH STENT PLACEMENT  February 2004   PCI-dLAD: 2.25 mm x 16 mm BMS;;PCI- RI: 2.75 mm 18 mm Cypher DES   NM MYOVIEW LTD  January 2012   Exercise ~9 min - 10 METS; no ischemia or infarction   PROSTATECTOMY N/A 08/13/2015   Procedure: TRANSVESICLE OPEN PROSTATECTOMY** ;  Surgeon: Carolan Clines, MD;  Location: [facility];  Service: Urology;  Laterality: N/A;   SPINE SURGERY     L4-L5    Family History  Problem Relation Age of Onset   Cancer Mother        pancreatic ca    Social History:  reports that he quit smoking about 28 years ago. His smoking use included cigarettes. He has never used smokeless tobacco. He reports that he does not drink alcohol and does not use drugs.  Review of Systems:  No further recurrence of gout, treated with colchicine in 5/22 Not on allopurinol   Lab Results  Component Value Date   LABURIC 8.0 (H) 01/24/2021     Hypertension:  He is taking 10 mg amlodipine and lisinopril 5 mg, amlodipine prescribed by his cardiologist  He says he has a blood pressure monitor at home but does not check regularly and does not know what his readings are   BP Readings from Last 3 Encounters:  08/22/21 118/72  08/07/21 124/66  07/29/21 128/70   Renal function history:   Creatinine is appearing to be progressively higher recently   He had a high urine microalbumin in 9/22 with his [facility] but normal now  Lab Results  Component Value Date   CREATININE 1.60 (H) 08/20/2021   CREATININE 1.56 (H) 07/08/2021   CREATININE 1.49 06/19/2021    HYPOPITUITARISM: He has been taking levothyroxine and Cortef as prescribed Takes Cortef 20 mg in the morning and 10 mg in the late afternoon  He feels fairly good and has no nausea or weight loss  No recent fatigue Free T4 has been consistently normal with  dosage of 88 mcg levothyroxine  Lab Results  Component Value Date   TSH 4.57 04/19/2021   TSH 4.64 (H) 12/05/2020   TSH 2.540 07/19/2020   FREET4 1.11 08/20/2021   FREET4 0.97 04/19/2021   FREET4 1.00 01/24/2021    Eye exams: He gets these done at the [facility]   Hypercholesterolemia: Treated with rosuvastatin by PCP  Lab Results  Component Value Date   CHOL 121 06/19/2021   HDL 33.90 (L) 06/19/2021   LDLCALC 64 06/19/2021   LDLDIRECT 85.0 05/18/2014   TRIG 118.0 06/19/2021   CHOLHDL 4 06/19/2021        LABS:  Lab on 08/20/2021  Component Date Value Ref Range Status   Free T4 08/20/2021 1.11  0.60 - 1.60 ng/dL Final   Comment: Specimens from patients who are undergoing biotin therapy and /or ingesting biotin supplements may contain high levels of biotin.  The higher biotin concentration in these specimens interferes with this Free T4 assay.  Specimens that contain high levels  of biotin may cause false high results for this Free T4 assay.  Please interpret results in light of the total clinical presentation of  the patient.     Sodium 08/20/2021 140  135 - 145 mEq/L Final   Potassium 08/20/2021 4.4  3.5 - 5.1 mEq/L Final   Chloride 08/20/2021 107  96 - 112 mEq/L Final   CO2 08/20/2021 24  19 - 32 mEq/L Final   Glucose, Bld 08/20/2021 118 (H)  70 - 99 mg/dL Final   BUN 08/20/2021 27 (H)  6 - 23 mg/dL Final   Creatinine, Ser 08/20/2021 1.60 (H)  0.40 - 1.50 mg/dL Final   GFR 08/20/2021 41.24 (L)  >60.00 mL/min Final   Calculated using the CKD-EPI  Creatinine Equation (2021)   Calcium 08/20/2021 9.5  8.4 - 10.5 mg/dL Final   Hgb A1c MFr Bld 08/20/2021 8.1 (H)  4.6 - 6.5 % Final   Glycemic Control Guidelines for People with Diabetes:Non Diabetic:  <6%Goal of Therapy: <7%Additional Action Suggested:  >8%      Examination:   BP 118/72 (Patient Position: Standing)    Pulse 76    Ht 5' 11.75" (1.822 m)    Wt 229 lb (103.9 kg)    SpO2 97%    BMI 31.27 kg/m   Body mass index is 31.27 kg/m.      ASSESSMENT/ PLAN:    Diabetes type 2 with mild obesity:   See history of present illness for detailed discussion of current diabetes management, blood sugar patterns and problems identified  A1c is 8.1 and higher  He is on premixed insulin twice a day and Janumet XR  Although his blood sugars are normal in the morning usually they are higher in the afternoon but he has not monitored much Also may have had a higher A1c from getting steroids last month Metformin dose limited by his renal dysfunction  RENAL dysfunction: Slightly worse  Hypertension: Blood pressure may be low normal but adequate and no orthostasis   Hypopituitarism: He has history of hypogonadism, this has not been treated because of lack of symptoms and patient not wanting any treatment  Secondary hypothyroidism: Subjectively doing well and free T4 is excellent with current dose of levothyroxine   PLAN: He will increase morning insulin to 26 units  He may benefit from joining a gym and doing nonweightbearing exercises  Start checking blood sugars consistently 2 hours after lunch or dinner and call if consistently high May not need to check morning sugars consistently Also may consider freestyle libre if covered  Trial of FARXIGA 5 mg daily for multiple benefits including chronic kidney disease, diabetes and blood pressure control and hopefully weight loss He will replace the Janumet with Xigduo 1 tablet daily He will need to increase his fluid intake overall At the  same time he will STOP LISINOPRIL No change in evening dose If he has any decrease in blood pressure he will let us know otherwise continue amlodipine Follow-up in about 6 weeks   Patient Instructions  XIGDUO 1 tablet in the morning, new medication This will replace JANUMET  INCREASE morning insulin to 26 units  Check more readings after lunch and dinner and less before breakfast  Call if blood sugars are consistently staying over 180 or going below 90  STOP lisinopril and keep a check on the blood pressure at least twice a week     Elayne Snare 08/22/2021, 1:53 PM

## 2021-08-22 NOTE — Patient Instructions (Signed)
XIGDUO 1 tablet in the morning, new medication ?This will replace JANUMET ? ?INCREASE morning insulin to 26 units ? ?Check more readings after lunch and dinner and less before breakfast ? ?Call if blood sugars are consistently staying over 180 or going below 90 ? ?STOP lisinopril and keep a check on the blood pressure at least twice a week ?

## 2021-09-08 ENCOUNTER — Encounter: Payer: Self-pay | Admitting: Cardiology

## 2021-09-08 DIAGNOSIS — R079 Chest pain, unspecified: Secondary | ICD-10-CM | POA: Insufficient documentation

## 2021-09-08 NOTE — Assessment & Plan Note (Signed)
He just had labs done in January.  62.  Well-controlled on current dose of rosuvastatin (we converted from atorvastatin to rosuvastatin). ? ?A1c 7.6.  Not adequately controlled on Janumet.  Defer to PCP, consider GLP-1 agonist. ?

## 2021-09-08 NOTE — Assessment & Plan Note (Signed)
Persistent episodes of chest pain over the last couple months.  Seems to be getting better, but still there.  Somewhat reproducible on exam.  Was evaluated with a Myoview stress test in January that was nonischemic. ? ?At this point, the pain seems to be musculoskeletal in nature.  If it does persist and becomes difficult to manage, we may be forced to consider invasive ischemic evaluation, but for now would like to treat medically. ?

## 2021-09-08 NOTE — Assessment & Plan Note (Signed)
Blood pressure looks well controlled on amlodipine, lisinopril and Toprol. ?

## 2021-09-08 NOTE — Assessment & Plan Note (Signed)
Distant history of PCI back in 2004 and 2008.  Noske Myoview in 2012 now most recent Myoview in January 2023 for evaluation of atypical sounding chest pain.  Did not really notice any benefit from sublingual nitro. ? ? ? ?Plan:  ?? Continue aspirin and statin;  ?? on low-dose Toprol along with max dose amlodipine for antianginal benefit and ?? lisinopril for afterload reduction. ?? Is on Januvia ?? Recent nuclear stress test was nonischemic.If he continues to have recurrent left-sided chest pain, may be forced to consider invasive ischemic evaluation with cardiac catheterization, but otherwise plan to treat musculoskeletal pain. ? ?

## 2021-09-08 NOTE — Assessment & Plan Note (Signed)
Lab Results  ?Component Value Date  ? CREATININE 1.60 (H) 08/20/2021  ? CREATININE 1.56 (H) 07/08/2021  ? CREATININE 1.49 06/19/2021  ? ?Stable renal function.  We will try to avoid contrast and nephrotoxins.  Careful use of NSAIDs if he does for MSK pain. ?

## 2021-09-26 ENCOUNTER — Telehealth: Payer: Self-pay

## 2021-09-26 ENCOUNTER — Ambulatory Visit (INDEPENDENT_AMBULATORY_CARE_PROVIDER_SITE_OTHER): Payer: Medicare Other

## 2021-09-26 DIAGNOSIS — Z0000 Encounter for general adult medical examination without abnormal findings: Secondary | ICD-10-CM

## 2021-09-26 NOTE — Progress Notes (Signed)
? ?Subjective:  ? Carl Gutierrez is a 76 y.o. male who presents for an Subsequent  Medicare Annual Wellness Visit. ? ?Review of Systems    ? ?Cardiac Risk Factors include: advanced age (>74mn, >>27women);diabetes mellitus;dyslipidemia;hypertension;male gender ? ?   ?Objective:  ?  ?Today's Vitals  ? ?There is no height or weight on file to calculate BMI. ? ? ?  09/26/2021  ? 10:59 AM 11/28/2019  ?  8:35 AM 11/23/2018  ?  8:16 AM 11/16/2017  ?  8:14 AM 08/06/2017  ? 11:57 AM 11/13/2016  ? 11:38 AM 11/30/2015  ?  4:20 PM  ?Advanced Directives  ?Does Patient Have a Medical Advance Directive? Yes Yes Yes Yes No No No  ?Type of [place]aramed[place]of [place] will [place] will [place]uff[place] CityLiving will [place]Living will     ?Does patient want to make changes to medical advance directive?   No - Patient declined Yes (MAU/Ambulatory/Procedural Areas - Information given)     ?Copy of [place]in Chart? No - copy requested Yes - validated most recent copy scanned in chart (See row information) No - copy requested Yes     ?Would patient like information on creating a medical advance directive?     No - Patient declined Yes (MAU/Ambulatory/Procedural Areas - Information given) No - patient declined information  ? ? ?Current Medications (verified) ?Outpatient Encounter Medications as of 09/26/2021  ?Medication Sig  ? Alcohol Swabs (PHARMACIST CHOICE ALCOHOL) PADS SMARTSIG:1 Each Topical 4 Times Daily  ? amLODipine (NORVASC) 10 MG tablet Take 1 tablet (10 mg total) by mouth daily.  ? aspirin 81 MG tablet Take 81 mg by mouth daily. Reported on 09/05/2015  ? B-D UF III MINI PEN NEEDLES 31G X 5 MM MISC USE FOR INSULIN PEN TWICE A DAY  ? Dapagliflozin-metFORMIN HCl ER (XIGDUO XR) 10-998 MG TB24 Take 1 tablet by mouth daily.  ? hydrocortisone (CORTEF) 20 MG tablet Take 10-20 mg by mouth See admin instructions. Takes 1 tablet 20 mg) by  mouth in the morning and 1/2 tablet (10 mg )by mouth at night  ? Insulin Lispro Prot & Lispro (HUMALOG 75/25 MIX) (75-25) 100 UNIT/ML Kwikpen 26 Units in am and 8 ac supper  ? levothyroxine (SYNTHROID) 88 MCG tablet Take 1 tablet (88 mcg total) by mouth daily.  ? metoprolol succinate (TOPROL-XL) 25 MG 24 hr tablet Take 1 tablet (25 mg total) by mouth daily.  ? nitroGLYCERIN (NITROSTAT) 0.4 MG SL tablet Place 1 tablet (0.4 mg total) under the tongue every 5 (five) minutes as needed for chest pain.  ? ONETOUCH VERIO test strip USE AS INSTRUCTED TO CHECK BLOOD SUGAR ONCE DAILY.  ? rosuvastatin (CRESTOR) 40 MG tablet TAKE 1 TABLET BY MOUTH EVERY DAY (Patient taking differently: Take 40 mg by mouth every evening.)  ? colchicine 0.6 MG tablet 2 tablets at onset of foot pain, take third tablet after 1 hour and then 1 tablet daily with food until pain improved (Patient not taking: Reported on 08/07/2021)  ? cyclobenzaprine (FLEXERIL) 5 MG tablet Take 1 tablet (5 mg total) by mouth 3 (three) times daily as needed for muscle spasms (start qhs prn due to sedation). (Patient not taking: Reported on 08/07/2021)  ? gabapentin (NEURONTIN) 100 MG capsule Take 1 capsule (100 mg total) by mouth 3 (three) times daily. Start QD for first few days, then twice per day. If needed after another week can  increase to 3 times per day. (Patient not taking: Reported on 09/26/2021)  ? lisinopril (ZESTRIL) 5 MG tablet TAKE 1 TABLET (5 MG TOTAL) BY MOUTH DAILY. (Patient not taking: Reported on 09/26/2021)  ? predniSONE (DELTASONE) 20 MG tablet 3 by mouth for 3 days, then 2 by mouth for 2 days, then 1 by mouth for 2 days, then 1/2 by mouth for 2 days. (Patient not taking: Reported on 08/07/2021)  ? [DISCONTINUED] testosterone cypionate (DEPO-TESTOSTERONE) 200 MG/ML injection Inject 1.5 mLs (300 mg total) into the muscle every 21 ( twenty-one) days.  ? ?No facility-administered encounter medications on file as of 09/26/2021.  ? ? ?Allergies  (verified) ?Hydrochlorothiazide and Other  ? ?History: ?Past Medical History:  ?Diagnosis Date  ? CAD S/P percutaneous coronary angioplasty February 2004; 2008  ? PCI-dLAD: 2.25 mm x 16 mm Express BMS; Ramus- 2.75 mm x 18 mm Cypher DES (2.8 mm); follow-up 11/'08: Patent LAD/ramus stents. 90% small OM. EF 50-60%.; Myoview 1/'12: Exercise 9 min, 10 METS with no ischemia/infarction.   ? Clotting disorder ([place])   ? Diabetes mellitus   ? Low-dose metformin  ? DJD (degenerative joint disease), lumbosacral   ?  status post L4-L5 surgery  ? Essential hypertension   ? History of DVT of lower extremity   ?  postoperative  ? Hyperlipidemia with target LDL less than 70   ? Hypothyroidism   ? Synthroid  ? Panhypopituitarism ([place])   ? Status post pituitary adenoma removal in 2012  ? Recurrent deep vein thrombosis (DVT) ([place]) 10/01/2015  ? ?Past Surgical History:  ?Procedure Laterality Date  ? BRAIN SURGERY  06/16/2010  ? Removal of pituitary adenoma  ? CARDIAC CATHETERIZATION  04/17/2007  ? Significant LAD tapering but no significant disease the distal stent. Patent Ramus stent. 90% lesion in small OM 2; small nondominant RCA. Medical therapy. EF 50-60%.  ? CORONARY ANGIOPLASTY WITH STENT PLACEMENT  07/17/2002  ? PCI-dLAD: 2.25 mm x 16 mm BMS;;PCI- RI: 2.75 mm 18 mm Cypher DES  ? NM MYOVIEW LTD  06/16/2010  ? a) Exercise ~9 min - 10 METS; no ischemia or infarction; b) 06/2021: LOW RISK.  Normal LV size and function.  EF 55 to 60%.  No RWMA.  No ischemia or infarct.  Fixed inferior basal apical defect with normal motion suggestive of artifact.  (Diaphragmatic attenuation)  ? PROSTATECTOMY N/A 08/13/2015  ? Procedure: TRANSVESICLE OPEN PROSTATECTOMY** ;  Surgeon: Carolan Clines, MD;  Location: [facility];  Service: Urology;  Laterality: N/A;  ? SPINE SURGERY    ? L4-L5  ? ?Family History  ?Problem Relation Age of Onset  ? Cancer Mother   ?     pancreatic ca  ? ?Social History  ? ?Socioeconomic History  ? Marital status: Married  ?   Spouse name: Not on file  ? Number of children: Not on file  ? Years of education: Not on file  ? Highest education level: Not on file  ?Occupational History  ? Not on file  ?Tobacco Use  ? Smoking status: Former  ?  Types: Cigarettes  ?  Quit date: 06/24/1993  ?  Years since quitting: 28.2  ? Smokeless tobacco: Never  ?Vaping Use  ? Vaping Use: Never used  ?Substance and Sexual Activity  ? Alcohol use: No  ? Drug use: No  ? Sexual activity: Not on file  ?  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 Not  on file  ?Social History Narrative  ? He is a married father of 2, grandfather 106. Exercises only occasionally. Not as much as he desires 2. 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  ? He does not smoke cigarettes -- quit in 1995. Does not drink alcohol.  ? ?Social Determinants of Health  ? ?Financial Resource Strain: Low Risk   ? Difficulty of Paying Living Expenses: Not hard at all  ?Food Insecurity: No Food Insecurity  ? Worried About Charity fundraiser in the Last Year: Never true  ? Ran Out of Food in the Last Year: Never true  ?Transportation Needs: No Transportation Needs  ? Lack of Transportation (Medical): No  ? Lack of Transportation (Non-Medical): No  ?Physical Activity: Inactive  ? Days of Exercise per Week: 0 days  ? Minutes of Exercise per Session: 0 min  ?Stress: No Stress Concern Present  ? Feeling of Stress : Not at all  ?Social Connections: Moderately Integrated  ? Frequency of Communication with Friends and Family: Twice a week  ? Frequency of Social Gatherings with Friends and Family: Twice a week  ? Attends Religious Services: More than 4 times per year  ? Active Member of Clubs or Organizations: No  ? Attends Archivist Meetings: Never  ? Marital Status: Married  ? ? ?Tobacco Counseling ?Counseling given: Not Answered ? ? ?Clinical Intake: ? ?Pre-visit preparation completed: Yes ? ?Pain : No/denies pain ? ?  ? ?Nutritional Risks:  None ?Diabetes: Yes ?CBG done?: No ?Did pt. bring in CBG monitor from home?: No ? ?How often do you need to have someone help you when you read instructions, pamphlets, or other written materials from your doctor or pharmacy?: 1 -

## 2021-09-26 NOTE — Telephone Encounter (Signed)
I cannot say that I have seen these at this time, do you have any records on this patient about procedure? ?

## 2021-09-26 NOTE — Patient Instructions (Signed)
Mr. Leiker , ?Thank you for taking time to come for your Medicare Wellness Visit. I appreciate your ongoing commitment to your health goals. Please review the following plan we discussed and let me know if I can assist you in the future.  ? ?Screening recommendations/referrals: ?Colonoscopy: no longer required  ?Recommended yearly ophthalmology/optometry visit for glaucoma screening and checkup ?Recommended yearly dental visit for hygiene and checkup ? ?Vaccinations: ?Influenza vaccine: completed  ?Pneumococcal vaccine: completed  ?Tdap vaccine: due  ?Shingles vaccine: will consider    ? ?Advanced directives: yes  ? ?Conditions/risks identified: none  ? ?Next appointment: none  ? ?Preventive Care 35 Years and Older, Male ?Preventive care refers to lifestyle choices and visits with your health care provider that can promote health and wellness. ?What does preventive care include? ?A yearly physical exam. This is also called an annual well check. ?Dental exams once or twice a year. ?Routine eye exams. Ask your health care provider how often you should have your eyes checked. ?Personal lifestyle choices, including: ?Daily care of your teeth and gums. ?Regular physical activity. ?Eating a healthy diet. ?Avoiding tobacco and drug use. ?Limiting alcohol use. ?Practicing safe sex. ?Taking low doses of aspirin every day. ?Taking vitamin and mineral supplements as recommended by your health care provider. ?What happens during an annual well check? ?The services and screenings done by your health care provider during your annual well check will depend on your age, overall health, lifestyle risk factors, and family history of disease. ?Counseling  ?Your health care provider may ask you questions about your: ?Alcohol use. ?Tobacco use. ?Drug use. ?Emotional well-being. ?Home and relationship well-being. ?Sexual activity. ?Eating habits. ?History of falls. ?Memory and ability to understand (cognition). ?Work and work  Statistician. ?Screening  ?You may have the following tests or measurements: ?Height, weight, and BMI. ?Blood pressure. ?Lipid and cholesterol levels. These may be checked every 5 years, or more frequently if you are over 56 years old. ?Skin check. ?Lung cancer screening. You may have this screening every year starting at age 57 if you have a 30-pack-year history of smoking and currently smoke or have quit within the past 15 years. ?Fecal occult blood test (FOBT) of the stool. You may have this test every year starting at age 107. ?Flexible sigmoidoscopy or colonoscopy. You may have a sigmoidoscopy every 5 years or a colonoscopy every 10 years starting at age 94. ?Prostate cancer screening. Recommendations will vary depending on your family history and other risks. ?Hepatitis C blood test. ?Hepatitis B blood test. ?Sexually transmitted disease (STD) testing. ?Diabetes screening. This is done by checking your blood sugar (glucose) after you have not eaten for a while (fasting). You may have this done every 1-3 years. ?Abdominal aortic aneurysm (AAA) screening. You may need this if you are a current or former smoker. ?Osteoporosis. You may be screened starting at age 32 if you are at high risk. ?Talk with your health care provider about your test results, treatment options, and if necessary, the need for more tests. ?Vaccines  ?Your health care provider may recommend certain vaccines, such as: ?Influenza vaccine. This is recommended every year. ?Tetanus, diphtheria, and acellular pertussis (Tdap, Td) vaccine. You may need a Td booster every 10 years. ?Zoster vaccine. You may need this after age 33. ?Pneumococcal 13-valent conjugate (PCV13) vaccine. One dose is recommended after age 58. ?Pneumococcal polysaccharide (PPSV23) vaccine. One dose is recommended after age 86. ?Talk to your health care provider about which screenings and vaccines you need  and how often you need them. ?This information is not intended to replace  advice given to you by your health care provider. Make sure you discuss any questions you have with your health care provider. ?Document Released: 06/29/2015 Document Revised: 02/20/2016 Document Reviewed: 04/03/2015 ?Elsevier Interactive Patient Education ? 2017 [employer]. ? ?Fall Prevention in the Home ?Falls can cause injuries. They can happen to people of all ages. There are many things you can do to make your home safe and to help prevent falls. ?What can I do on the outside of my home? ?Regularly fix the edges of walkways and driveways and fix any cracks. ?Remove anything that might make you trip as you walk through a door, such as a raised step or threshold. ?Trim any bushes or trees on the path to your home. ?Use bright outdoor lighting. ?Clear any walking paths of anything that might make someone trip, such as rocks or tools. ?Regularly check to see if handrails are loose or broken. Make sure that both sides of any steps have handrails. ?Any raised decks and porches should have guardrails on the edges. ?Have any leaves, snow, or ice cleared regularly. ?Use sand or salt on walking paths during winter. ?Clean up any spills in your garage right away. This includes oil or grease spills. ?What can I do in the bathroom? ?Use night lights. ?Install grab bars by the toilet and in the tub and shower. Do not use towel bars as grab bars. ?Use non-skid mats or decals in the tub or shower. ?If you need to sit down in the shower, use a plastic, non-slip stool. ?Keep the floor dry. Clean up any water that spills on the floor as soon as it happens. ?Remove soap buildup in the tub or shower regularly. ?Attach bath mats securely with double-sided non-slip rug tape. ?Do not have throw rugs and other things on the floor that can make you trip. ?What can I do in the bedroom? ?Use night lights. ?Make sure that you have a light by your bed that is easy to reach. ?Do not use any sheets or blankets that are too big for your bed.  They should not hang down onto the floor. ?Have a firm chair that has side arms. You can use this for support while you get dressed. ?Do not have throw rugs and other things on the floor that can make you trip. ?What can I do in the kitchen? ?Clean up any spills right away. ?Avoid walking on wet floors. ?Keep items that you use a lot in easy-to-reach places. ?If you need to reach something above you, use a strong step stool that has a grab bar. ?Keep electrical cords out of the way. ?Do not use floor polish or wax that makes floors slippery. If you must use wax, use non-skid floor wax. ?Do not have throw rugs and other things on the floor that can make you trip. ?What can I do with my stairs? ?Do not leave any items on the stairs. ?Make sure that there are handrails on both sides of the stairs and use them. Fix handrails that are broken or loose. Make sure that handrails are as long as the stairways. ?Check any carpeting to make sure that it is firmly attached to the stairs. Fix any carpet that is loose or worn. ?Avoid having throw rugs at the top or bottom of the stairs. If you do have throw rugs, attach them to the floor with carpet tape. ?Make sure that you  have a light switch at the top of the stairs and the bottom of the stairs. If you do not have them, ask someone to add them for you. ?What else can I do to help prevent falls? ?Wear shoes that: ?Do not have high heels. ?Have rubber bottoms. ?Are comfortable and fit you well. ?Are closed at the toe. Do not wear sandals. ?If you use a stepladder: ?Make sure that it is fully opened. Do not climb a closed stepladder. ?Make sure that both sides of the stepladder are locked into place. ?Ask someone to hold it for you, if possible. ?Clearly mark and make sure that you can see: ?Any grab bars or handrails. ?First and last steps. ?Where the edge of each step is. ?Use tools that help you move around (mobility aids) if they are needed. These  include: ?Canes. ?Walkers. ?Scooters. ?Crutches. ?Turn on the lights when you go into a dark area. Replace any light bulbs as soon as they burn out. ?Set up your furniture so you have a clear path. Avoid moving your furniture around. ?If a

## 2021-09-26 NOTE — Telephone Encounter (Signed)
Patient's wife called wanting to see if we had received the report/office visit from [facility]. Wife stated he was supposed to be having a procedure.  ?

## 2021-09-26 NOTE — Telephone Encounter (Signed)
Office visit note from 09/06/2021 in media section.  Left renal neoplasm.  Per urology note mass in question has some features concerning for malignancy.  After various options presented it appears that patient elected to proceed with cryoablation.  It looks like the next step is to meet with interventional radiology.  I have reviewed the note.  Did they have any specific questions?  Happy to call them if needed.  Thanks.  ?

## 2021-09-27 ENCOUNTER — Other Ambulatory Visit: Payer: Self-pay | Admitting: Urology

## 2021-09-27 DIAGNOSIS — N2889 Other specified disorders of kidney and ureter: Secondary | ICD-10-CM

## 2021-09-27 NOTE — Telephone Encounter (Signed)
Called home and Carl Gutierrez cell number to discuss this, no answer either line LM on both.  ? ?Per Dr Carlota Raspberry message we have received notes from them and they are in the chart does the patient/wife have a specific concern or was this to ensure Dr Carlota Raspberry is up to date? ? ?

## 2021-09-27 NOTE — Telephone Encounter (Signed)
Called back to discuss with patient I am not sure what procedure with knee has to do with urology note, asking for clarification in order to follow up with Dr Carlota Raspberry  ?

## 2021-09-27 NOTE — Telephone Encounter (Signed)
Pt's wife stated she wants to know when the pt will have a procedure to freeze the spot on his kidney. Pt's wife will like for someone to contact her to discuss further. Please advise.   ?

## 2021-10-01 NOTE — Telephone Encounter (Signed)
Called l/m.

## 2021-10-07 ENCOUNTER — Other Ambulatory Visit: Payer: Self-pay | Admitting: Endocrinology

## 2021-10-14 ENCOUNTER — Other Ambulatory Visit: Payer: Medicare Other

## 2021-10-23 NOTE — Progress Notes (Signed)
? ?Patient ID: Carl Gutierrez, male   DOB: 06/13/1946, 76 y.o.   MRN: [id number] ? ?       ? ? ?Reason for Appointment: Endocrinology follow-up ? ?History of Present Illness  ? ?DIABETES type II: ? ?Recent history: ?    ?INSULIN regimen: Humalog 75/25 insulin, 26 units before breakfast and 8 before dinner ? ?Non-insulin hypoglycemic drugs: Xigduo 10/998 daily ? ?His A1c is most recently 8.1, was 7.6 before ? ? ?Current management, blood sugar patterns and problems identified: ?He did bring his monitor for download today ?He has been switched from [place] to [place] for better blood sugar management as well as renal benefits for his mild CKD  ?He has no difficulty taking this and no side effects  ?Although he has checked his blood sugars more consistently after lunch and dinner not clear if they are better on an average ?However he has less variability in blood sugars which were as high as 323 previously  ?Also he may have had a falsely high reading of 404 without any unusual food or drink  ?He is not doing any formal exercise and just trying to be more active overall  ?Weight is down 3 pounds ?He is usually remembering to take his insulin doses before starting to eat both morning and evening ? ? ?Side effects from medications: None ? ?    Monitors blood glucose:  Once or twice a day.    Glucometer:  One Touch          ? ?Blood Glucose readings from download: ? ? ?PRE-MEAL Fasting Lunch Dinner Bedtime Overall  ?Glucose range:   119-183    ?Mean/median:   150  /150  ? ?POST-MEAL PC Breakfast PC Lunch PC Dinner  ?Glucose range: 168  134-180  ?Mean/median:     ? ?Prior  ? ?PRE-MEAL Fasting Lunch Dinner Bedtime Overall  ?Glucose range: 100-123 124, 246 136-319    ?Mean/median:     156  ? ?POST-MEAL PC Breakfast PC Lunch PC Dinner  ?Glucose range:   135-323  ?Mean/median:     ? ? ?Dietician visit: Most recent: 11/2012    ? ?Weight history ? ? ?Wt Readings from Last 3 Encounters:  ?10/24/21 225 lb 9.6 oz (102.3 kg)  ?08/22/21  229 lb (103.9 kg)  ?08/07/21 228 lb 6.4 oz (103.6 kg)  ?      ?   ?Diabetes labs: ? ?Lab Results  ?Component Value Date  ? HGBA1C 8.1 (H) 08/20/2021  ? HGBA1C 7.6 (H) 04/19/2021  ? HGBA1C 7.6 (H) 01/24/2021  ? ?Lab Results  ?Component Value Date  ? MICROALBUR 4.5 (H) 04/19/2021  ? [place] 64 06/19/2021  ? CREATININE 1.60 (H) 08/20/2021  ? ?Other problems discussed today: See review of systems ? ?Allergies as of 10/24/2021   ? ?   Reactions  ? Hydrochlorothiazide   ? Leg and side pain with this  ? Other   ? Blood pressure med name unknown.  ? ?  ? ?  ?Medication List  ?  ? ?  ? Accurate as of Oct 24, 2021 12:08 PM. If you have any questions, ask your nurse or doctor.  ?  ?  ? ?  ? ?STOP taking these medications   ? ?lisinopril 5 MG tablet ?Commonly known as: ZESTRIL ?Stopped by: Elayne Snare, MD ?  ? ?  ? ?TAKE these medications   ? ?amLODipine 10 MG tablet ?Commonly known as: NORVASC ?Take 1 tablet (10 mg total) by mouth  daily. ?  ?aspirin 81 MG tablet ?Take 81 mg by mouth daily. Reported on 09/05/2015 ?  ?B-D UF III MINI PEN NEEDLES 31G X 5 MM Misc ?Generic drug: Insulin Pen Needle ?USE FOR INSULIN PEN TWICE A DAY ?  ?colchicine 0.6 MG tablet ?2 tablets at onset of foot pain, take third tablet after 1 hour and then 1 tablet daily with food until pain improved ?  ?cyclobenzaprine 5 MG tablet ?Commonly known as: FLEXERIL ?Take 1 tablet (5 mg total) by mouth 3 (three) times daily as needed for muscle spasms (start qhs prn due to sedation). ?  ?gabapentin 100 MG capsule ?Commonly known as: NEURONTIN ?Take 1 capsule (100 mg total) by mouth 3 (three) times daily. Start QD for first few days, then twice per day. If needed after another week can increase to 3 times per day. ?  ?hydrocortisone 20 MG tablet ?Commonly known as: CORTEF ?Take 10-20 mg by mouth See admin instructions. Takes 1 tablet 20 mg) by mouth in the morning and 1/2 tablet (10 mg )by mouth at night ?  ?Insulin Lispro Prot & Lispro (75-25) 100 UNIT/ML  Kwikpen ?Commonly known as: HUMALOG 75/25 MIX ?INJECT 20 UNITS UNDER THE SKIN BEFORE BREAKFAST AND 6 UNITS BEFORE SUPPER. (REPLACES 70/30) ?  ?levothyroxine 88 MCG tablet ?Commonly known as: SYNTHROID ?Take 1 tablet (88 mcg total) by mouth daily. ?  ?metoprolol succinate 25 MG 24 hr tablet ?Commonly known as: TOPROL-XL ?Take 1 tablet (25 mg total) by mouth daily. ?  ?nitroGLYCERIN 0.4 MG SL tablet ?Commonly known as: NITROSTAT ?Place 1 tablet (0.4 mg total) under the tongue every 5 (five) minutes as needed for chest pain. ?  ?OneTouch Verio test strip ?Generic drug: glucose blood ?USE AS INSTRUCTED TO CHECK BLOOD SUGAR ONCE DAILY. ?  ?Pharmacist Choice Alcohol Pads ?SMARTSIG:1 Each Topical 4 Times Daily ?  ?predniSONE 20 MG tablet ?Commonly known as: DELTASONE ?3 by mouth for 3 days, then 2 by mouth for 2 days, then 1 by mouth for 2 days, then 1/2 by mouth for 2 days. ?  ?rosuvastatin 40 MG tablet ?Commonly known as: CRESTOR ?TAKE 1 TABLET BY MOUTH EVERY DAY ?What changed: when to take this ?  ?Xigduo XR 10-998 MG Tb24 ?Generic drug: Dapagliflozin-metFORMIN HCl ER ?Take 1 tablet by mouth daily. ?  ? ?  ? ? ?Allergies:  ?Allergies  ?Allergen Reactions  ? Hydrochlorothiazide   ?  Leg and side pain with this  ? Other   ?  Blood pressure med name unknown.  ? ? ?Past Medical History:  ?Diagnosis Date  ? CAD S/P percutaneous coronary angioplasty February 2004; 2008  ? PCI-dLAD: 2.25 mm x 16 mm Express BMS; Ramus- 2.75 mm x 18 mm Cypher DES (2.8 mm); follow-up 11/'08: Patent LAD/ramus stents. 90% small OM. EF 50-60%.; Myoview 1/'12: Exercise 9 min, 10 METS with no ischemia/infarction.   ? Clotting disorder ([place])   ? Diabetes mellitus   ? Low-dose metformin  ? DJD (degenerative joint disease), lumbosacral   ?  status post L4-L5 surgery  ? Essential hypertension   ? History of DVT of lower extremity   ?  postoperative  ? Hyperlipidemia with target LDL less than 70   ? Hypothyroidism   ? Synthroid  ? Panhypopituitarism ([place])    ? Status post pituitary adenoma removal in 2012  ? Recurrent deep vein thrombosis (DVT) ([place]) 10/01/2015  ? ? ?Past Surgical History:  ?Procedure Laterality Date  ? BRAIN SURGERY  06/16/2010  ?  Removal of pituitary adenoma  ? CARDIAC CATHETERIZATION  04/17/2007  ? Significant LAD tapering but no significant disease the distal stent. Patent Ramus stent. 90% lesion in small OM 2; small nondominant RCA. Medical therapy. EF 50-60%.  ? CORONARY ANGIOPLASTY WITH STENT PLACEMENT  07/17/2002  ? PCI-dLAD: 2.25 mm x 16 mm BMS;;PCI- RI: 2.75 mm 18 mm Cypher DES  ? NM MYOVIEW LTD  06/16/2010  ? a) Exercise ~9 min - 10 METS; no ischemia or infarction; b) 06/2021: LOW RISK.  Normal LV size and function.  EF 55 to 60%.  No RWMA.  No ischemia or infarct.  Fixed inferior basal apical defect with normal motion suggestive of artifact.  (Diaphragmatic attenuation)  ? PROSTATECTOMY N/A 08/13/2015  ? Procedure: TRANSVESICLE OPEN PROSTATECTOMY** ;  Surgeon: Carolan Clines, MD;  Location: [facility];  Service: Urology;  Laterality: N/A;  ? SPINE SURGERY    ? L4-L5  ? ? ?Family History  ?Problem Relation Age of Onset  ? Cancer Mother   ?     pancreatic ca  ? ? ?Social History:  reports that he quit smoking about 28 years ago. His smoking use included cigarettes. He has never used smokeless tobacco. He reports that he does not drink alcohol and does not use drugs. ? ?Review of Systems: ? ?No further recurrence of gout, treated with colchicine in 5/22 ?Not on allopurinol  ? ?Lab Results  ?Component Value Date  ? LABURIC 8.0 (H) 01/24/2021  ? ? ? ?Hypertension:  He is taking 10 mg amlodipine and lisinopril 5 mg, amlodipine prescribed by his cardiologist ? ?He says he has a blood pressure monitor at home  ?Home nurse measured his blood pressure at 122/78 ? ? ?BP Readings from Last 3 Encounters:  ?10/24/21 122/64  ?08/22/21 118/72  ?08/07/21 124/66  ? ?Renal function history:  ? ?Creatinine is relatively higher recently ? ? ?He had a high urine  microalbumin in 9/22 with his [facility] but normal here ? ?Lab Results  ?Component Value Date  ? CREATININE 1.60 (H) 08/20/2021  ? CREATININE 1.56 (H) 07/08/2021  ? CREATININE 1.49 06/19/2021  ? ? ?HYPOP

## 2021-10-24 ENCOUNTER — Ambulatory Visit: Payer: Medicare Other | Admitting: Endocrinology

## 2021-10-24 ENCOUNTER — Encounter: Payer: Self-pay | Admitting: Endocrinology

## 2021-10-24 VITALS — BP 122/64 | HR 66 | Ht 71.5 in | Wt 225.6 lb

## 2021-10-24 DIAGNOSIS — E1165 Type 2 diabetes mellitus with hyperglycemia: Secondary | ICD-10-CM

## 2021-10-24 DIAGNOSIS — N289 Disorder of kidney and ureter, unspecified: Secondary | ICD-10-CM

## 2021-10-24 DIAGNOSIS — Z794 Long term (current) use of insulin: Secondary | ICD-10-CM

## 2021-10-24 DIAGNOSIS — E230 Hypopituitarism: Secondary | ICD-10-CM | POA: Diagnosis not present

## 2021-10-24 DIAGNOSIS — I10 Essential (primary) hypertension: Secondary | ICD-10-CM | POA: Diagnosis not present

## 2021-10-24 LAB — BASIC METABOLIC PANEL
BUN: 25 mg/dL — ABNORMAL HIGH (ref 6–23)
CO2: 22 mEq/L (ref 19–32)
Calcium: 9.3 mg/dL (ref 8.4–10.5)
Chloride: 107 mEq/L (ref 96–112)
Creatinine, Ser: 1.68 mg/dL — ABNORMAL HIGH (ref 0.40–1.50)
GFR: 38.85 mL/min — ABNORMAL LOW (ref >60.00)
Glucose, Bld: 118 mg/dL — ABNORMAL HIGH (ref 70–99)
Potassium: 4.1 mEq/L (ref 3.5–5.1)
Sodium: 138 mEq/L (ref 135–145)

## 2021-10-24 NOTE — Patient Instructions (Signed)
Check blood sugars on waking up 2 days a week  Also check blood sugars about 2 hours after meals and do this after different meals by rotation  Recommended blood sugar levels on waking up are 90-130 and about 2 hours after meal is 130-160  Please bring your blood sugar monitor to each visit, thank you   

## 2021-10-25 LAB — FRUCTOSAMINE: Fructosamine: 253 umol/L (ref 0–285)

## 2021-11-04 ENCOUNTER — Encounter: Payer: Self-pay | Admitting: *Deleted

## 2021-11-04 ENCOUNTER — Ambulatory Visit
Admission: RE | Admit: 2021-11-04 | Discharge: 2021-11-04 | Disposition: A | Discharge status: Home / Self Care | Payer: Medicare Other | Source: Ambulatory Visit | Attending: Urology | Admitting: Urology

## 2021-11-04 DIAGNOSIS — N2889 Other specified disorders of kidney and ureter: Secondary | ICD-10-CM

## 2021-11-04 HISTORY — PX: IR RADIOLOGIST EVAL & MGMT: IMG5224

## 2021-11-04 NOTE — Consult Note (Signed)
Chief Complaint: Patient was seen in consultation today for a left renal mass at the request of Winter,Christopher Marjory Lies  Referring Physician(s): Winter,Christopher Marjory Lies  History of Present Illness: Carl Gutierrez is a 76 y.o. male with incidental detection of a left renal mass by CTA of the abdomen on 07/08/21 obtained for a chest pain work up. This was further characterized by MRI, demonstrating a solid, partially enhancing 1.2 x 1.6 cm partially exophytic mass of the left kidney. There is no evidence by imaging of the chest, abdomen and pelvis by CTA of metastatic disease. He is asymptomatic with respect to the mass and has had no urinary symptoms. He is status post prior prostatectomy in 2017 for BPH.  He was evaluated for chest pain by Dr. Ellyn Hack in February given his prior PCI in 2004 and 2008 with stent placement. A nuclear perfusion scan with pharmacologic stress on 07/03/21 was negative for ischemia. Other than some episodes in January, he has had no further episodes of chest pain.  Past Medical History:  Diagnosis Date   CAD S/P percutaneous coronary angioplasty February 2004; 2008   PCI-dLAD: 2.25 mm x 16 mm Express BMS; Ramus- 2.75 mm x 18 mm Cypher DES (2.8 mm); follow-up 11/'08: Patent LAD/ramus stents. 90% small OM. EF 50-60%.; Myoview 1/'12: Exercise 9 min, 10 METS with no ischemia/infarction.    Clotting disorder (HCC)    Diabetes mellitus    Low-dose metformin   DJD (degenerative joint disease), lumbosacral     status post L4-L5 surgery   Essential hypertension    History of DVT of lower extremity     postoperative   Hyperlipidemia with target LDL less than 70    Hypothyroidism    Synthroid   Panhypopituitarism ([place])    Status post pituitary adenoma removal in 2012   Recurrent deep vein thrombosis (DVT) ([place]) 10/01/2015    Past Surgical History:  Procedure Laterality Date   BRAIN SURGERY  06/16/2010   Removal of pituitary adenoma   CARDIAC CATHETERIZATION   04/17/2007   Significant LAD tapering but no significant disease the distal stent. Patent Ramus stent. 90% lesion in small OM 2; small nondominant RCA. Medical therapy. EF 50-60%.   CORONARY ANGIOPLASTY WITH STENT PLACEMENT  07/17/2002   PCI-dLAD: 2.25 mm x 16 mm BMS;;PCI- RI: 2.75 mm 18 mm Cypher DES   NM MYOVIEW LTD  06/16/2010   a) Exercise ~9 min - 10 METS; no ischemia or infarction; b) 06/2021: LOW RISK.  Normal LV size and function.  EF 55 to 60%.  No RWMA.  No ischemia or infarct.  Fixed inferior basal apical defect with normal motion suggestive of artifact.  (Diaphragmatic attenuation)   PROSTATECTOMY N/A 08/13/2015   Procedure: TRANSVESICLE OPEN PROSTATECTOMY** ;  Surgeon: Carolan Clines, MD;  Location: [facility];  Service: Urology;  Laterality: N/A;   SPINE SURGERY     L4-L5    Allergies: Hydrochlorothiazide and Other  Medications: Prior to Admission medications   Medication Sig Start Date End Date Taking? Authorizing Provider  Alcohol Swabs (PHARMACIST CHOICE ALCOHOL) PADS SMARTSIG:1 Each Topical 4 Times Daily 07/17/21   [provider]  amLODipine (NORVASC) 10 MG tablet Take 1 tablet (10 mg total) by mouth daily. 06/19/21   Wendie Agreste, MD  aspirin 81 MG tablet Take 81 mg by mouth daily. Reported on 09/05/2015    [provider]  B-D UF III MINI PEN NEEDLES 31G X 5 MM MISC USE FOR INSULIN PEN TWICE A DAY  04/29/21   Elayne Snare, MD  colchicine 0.6 MG tablet 2 tablets at onset of foot pain, take third tablet after 1 hour and then 1 tablet daily with food until pain improved Patient not taking: Reported on 08/07/2021 04/24/21   Elayne Snare, MD  cyclobenzaprine (FLEXERIL) 5 MG tablet Take 1 tablet (5 mg total) by mouth 3 (three) times daily as needed for muscle spasms (start qhs prn due to sedation). Patient not taking: Reported on 08/07/2021 07/15/21   Wendie Agreste, MD  Dapagliflozin-metFORMIN HCl ER (XIGDUO XR) 10-998 MG TB24 Take 1 tablet by mouth daily.  08/22/21   Elayne Snare, MD  gabapentin (NEURONTIN) 100 MG capsule Take 1 capsule (100 mg total) by mouth 3 (three) times daily. Start QD for first few days, then twice per day. If needed after another week can increase to 3 times per day. Patient not taking: Reported on 09/26/2021 07/29/21   Wendie Agreste, MD  hydrocortisone (CORTEF) 20 MG tablet Take 10-20 mg by mouth See admin instructions. Takes 1 tablet 20 mg) by mouth in the morning and 1/2 tablet (10 mg )by mouth at night    [provider]  Insulin Lispro Prot & Lispro (HUMALOG 75/25 MIX) (75-25) 100 UNIT/ML Kwikpen INJECT 20 UNITS UNDER THE SKIN BEFORE BREAKFAST AND 6 UNITS BEFORE SUPPER. (REPLACES 70/30) 10/07/21   Elayne Snare, MD  levothyroxine (SYNTHROID) 88 MCG tablet Take 1 tablet (88 mcg total) by mouth daily. 06/19/21   Wendie Agreste, MD  metoprolol succinate (TOPROL-XL) 25 MG 24 hr tablet Take 1 tablet (25 mg total) by mouth daily. 06/19/21   Wendie Agreste, MD  nitroGLYCERIN (NITROSTAT) 0.4 MG SL tablet Place 1 tablet (0.4 mg total) under the tongue every 5 (five) minutes as needed for chest pain. 07/01/21 09/29/21  Martinique, Peter M, MD  [facility] VERIO test strip USE AS INSTRUCTED TO CHECK BLOOD SUGAR ONCE DAILY. 11/23/20   Elayne Snare, MD  predniSONE (DELTASONE) 20 MG tablet 3 by mouth for 3 days, then 2 by mouth for 2 days, then 1 by mouth for 2 days, then 1/2 by mouth for 2 days. 07/29/21   Wendie Agreste, MD  rosuvastatin (CRESTOR) 40 MG tablet TAKE 1 TABLET BY MOUTH EVERY DAY Patient taking differently: Take 40 mg by mouth every evening. 06/02/21   Wendie Agreste, MD  testosterone cypionate (DEPO-TESTOSTERONE) 200 MG/ML injection Inject 1.5 mLs (300 mg total) into the muscle every 21 ( twenty-one) days. 11/23/14 03/14/15  Elayne Snare, MD     Family History  Problem Relation Age of Onset   Cancer Mother        pancreatic ca    Social History   Socioeconomic History   Marital status: Married    Spouse name: Not  on file   Number of children: Not on file   Years of education: Not on file   Highest education level: Not on file  Occupational History   Not on file  Tobacco Use   Smoking status: Former    Types: Cigarettes    Quit date: 06/24/1993    Years since quitting: 28.3   Smokeless tobacco: Never  Vaping Use   Vaping Use: Never used  Substance and Sexual Activity   Alcohol use: No   Drug use: No   Sexual activity: Not on file    Comment: Local truck driver, 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 Exercises only occasionally. Not as much as he desires 2. 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   He does not smoke cigarettes -- quit in 1995. Does not drink alcohol.   Social Determinants of Health   Financial Resource Strain: Low Risk    Difficulty of Paying Living Expenses: Not hard at all  Food Insecurity: No Food Insecurity   Worried About Charity fundraiser in the Last Year: Never true   [place] in the Last Year: Never true  Transportation Needs: No Transportation Needs   Lack of Transportation (Medical): No   Lack of Transportation (Non-Medical): No  Physical Activity: Inactive   Days of Exercise per Week: 0 days   Minutes of Exercise per Session: 0 min  Stress: No Stress Concern Present   Feeling of Stress : Not at all  Social Connections: Moderately Integrated   Frequency of Communication with Friends and Family: Twice a week   Frequency of Social Gatherings with Friends and Family: Twice a week   Attends Religious Services: More than 4 times per year   Active Member of [employer] or Organizations: No   Attends Archivist Meetings: Never   Marital Status: Married    ECOG Status: 0 - Asymptomatic  Review of Systems: A 12 point ROS discussed and pertinent positives are indicated in the HPI above.  All other systems are negative.  Review of Systems   Constitutional: Negative.   Respiratory: Negative.    Cardiovascular: Negative.   Gastrointestinal: Negative.   Genitourinary: Negative.   Musculoskeletal: Negative.   Neurological: Negative.    Vital Signs: There were no vitals taken for this visit.  Physical Exam Vitals reviewed.  Constitutional:      General: He is not in acute distress.    Appearance: Normal appearance. He is not ill-appearing, toxic-appearing or diaphoretic.  HENT:     Head: Normocephalic and atraumatic.  Cardiovascular:     Rate and Rhythm: Normal rate and regular rhythm.     Heart sounds: Normal heart sounds. No murmur heard.   No friction rub. No gallop.  Pulmonary:     Effort: Pulmonary effort is normal. No respiratory distress.     Breath sounds: Normal breath sounds. No stridor. No wheezing, rhonchi or rales.  Abdominal:     General: Bowel sounds are normal. There is no distension.     Palpations: Abdomen is soft. There is no mass.     Tenderness: There is no abdominal tenderness. There is no right CVA tenderness, left CVA tenderness, guarding or rebound.     Hernia: No hernia is present.  Musculoskeletal:        General: No swelling.     Cervical back: Neck supple. No tenderness.  Lymphadenopathy:     Cervical: No cervical adenopathy.  Skin:    General: Skin is warm and dry.  Neurological:     General: No focal deficit present.     Mental Status: He is alert and oriented to person, place, and time.     Imaging: No results found. See below.  Labs:  CBC: Recent Labs    07/08/21 1223 07/15/21 1532  WBC 11.2* 11.4*  HGB 13.2 13.2  HCT 42.2 40.3  PLT 362 357.0    COAGS: No results for input(s): INR, APTT in the last 8760 hours.  BMP: Recent Labs    06/19/21 0842 07/08/21 1223 08/20/21 3614  10/24/21 0849  NA 139 137 140 138  K 4.4 4.2 4.4 4.1  CL 106 110 107 107  CO2 24 21* 24 22  GLUCOSE 158* 159* 118* 118*  BUN 22 23 27* 25*  CALCIUM 9.2 8.7* 9.5 9.3  CREATININE  1.49 1.56* 1.60* 1.68*  GFRNONAA  --  45*  --   --     LIVER FUNCTION TESTS: Recent Labs    12/05/20 0847 06/19/21 0842  BILITOT 0.3 0.3  AST 13 13  ALT 15 15  ALKPHOS 71 75  PROT 7.4 7.1  ALBUMIN 4.4 4.1     Assessment and Plan:  I met with Carl Gutierrez and his wife. We reviewed the MRI on 07/31/21 demonstrating a posterior, lower pole cortical mass of the left kidney with small exophytic component that appears solid and demonstrates avid enhancement. I am measuring the mass at approximately 1.8 x 1.5 x 1.7 cm. By imaging, the mass is statistically likely to represent a renal carcinoma. A subtle area of abnormality can be seen in this area on a CT dated 06/08/15 measuring only 10-11 mm, further supporting a slow growing neoplasm. The mass is amenable in size and location to percutaneous cryoablation. Biopsy can be performed at the time of ablation. A nephron-sparing, less invasive procedure would also be indicated due to underlying CKD, with latest Cr of 1.68 and estimated GFR of 39 mL/min on 10/24/21.  Details of percutaneous renal cryoablation were discussed with Carl Gutierrez including the need for general anesthesia. Given his history of CAD with prior PCI, we will check with Dr. Ellyn Hack to see if he needs any additional clearance prior to being placed under general anesthesia.  After discussion, Carl Gutierrez would like to proceed with cryoablation of the left renal mass. We will begin the scheduling process at [facility].  Thank you for this interesting consult.  I greatly enjoyed meeting [facility] and look forward to participating in their care.  A copy of this report was sent to the requesting provider on this date.  Electronically Signed: Azzie Roup 11/04/2021, 1:38 PM    I spent a total of 40 Minutes in face to face in clinical consultation, greater than 50% of which was counseling/coordinating care for a left renal mass.

## 2021-11-05 ENCOUNTER — Other Ambulatory Visit (HOSPITAL_COMMUNITY): Payer: Self-pay | Admitting: Interventional Radiology

## 2021-11-05 ENCOUNTER — Telehealth: Payer: Self-pay | Admitting: Cardiology

## 2021-11-05 DIAGNOSIS — N2889 Other specified disorders of kidney and ureter: Secondary | ICD-10-CM

## 2021-11-05 NOTE — Telephone Encounter (Signed)
   Pre-operative Risk Assessment    Patient Name: Carl Gutierrez  DOB: [birth year] MRN: [id number]      Request for Surgical Clearance    Procedure:   Left Renal Crypablation  Date of Surgery:  Clearance TBD                                 Surgeon:  Dr. Aletta Edouard Surgeon's Group or Practice Name:  [place] Phone number:  [phone] Fax number:  [phone]   Type of Clearance Requested:   - Medical    Type of Anesthesia:  General    Additional requests/questions:  Please advise surgeon/provider what medications should be held.  Signed, Belisicia T Kenton Kingfisher   11/05/2021, 8:42 AM

## 2021-11-05 NOTE — Telephone Encounter (Signed)
Cardiology Problems    CAD S/P percutaneous coronary angioplasty - Primary (Chronic)      Distant history of PCI back in 2004 and 2008.  [place] Myoview in 2012 now most recent Myoview in January 2023 for evaluation of atypical sounding chest pain.  Did not really notice any benefit from sublingual nitro.       Dr. Ellyn Hack, coronary intervention noted above. May patient hold his aspirin in preparation for upcoming left renal crypablation? We will call him to assess his symptoms of chest pain.  Please direct your response to p cv div preop.  Thank you, Emmaline Life, NP-C    11/05/2021, 11:39 AM [place] [address], [address] Office [phone] Fax [phone]

## 2021-11-07 NOTE — Telephone Encounter (Signed)
His aspirin may be held prior to his procedure.  Preoperative team, please contact this patient and set up a phone call appointment for further cardiac evaluation.  Thank you for your help.  Jossie Ng. Genny Caulder NP-C    11/07/2021, 8:53 AM [place] [address] Office [phone] Fax [phone]

## 2021-11-07 NOTE — Telephone Encounter (Signed)
Left message for pt to call back to schedule a tele pre op appt.

## 2021-11-07 NOTE — Telephone Encounter (Signed)
Yes - OK to hold ASA    [facility]

## 2021-11-08 ENCOUNTER — Telehealth: Payer: Self-pay | Admitting: *Deleted

## 2021-11-08 NOTE — Telephone Encounter (Signed)
Patient's wife returning call. 

## 2021-11-08 NOTE — Telephone Encounter (Signed)
S/w the DPR, the pt's wife who has scheduled a tele pre op appt for the pt 11/14/21 @ 11:40. Med rec and consent are done.

## 2021-11-08 NOTE — Telephone Encounter (Signed)
S/w the DPR, the pt's wife who has scheduled a tele pre op appt for the pt 11/14/21 @ 11:40. Med rec and consent are done.     Patient Consent for Virtual Visit        Carl Gutierrez has provided verbal consent on 11/08/2021 for a virtual visit (video or telephone).   CONSENT FOR VIRTUAL VISIT FOR:  Carl Gutierrez  By participating in this virtual visit I agree to the following:  I hereby voluntarily request, consent and authorize [place] and its employed or contracted physicians, physician assistants, nurse practitioners or other licensed health care professionals (the Practitioner), to provide me with telemedicine health care services (the "Services") as deemed necessary by the treating Practitioner. I acknowledge and consent to receive the Services by the Practitioner via telemedicine. I understand that the telemedicine visit will involve communicating with the Practitioner through live audiovisual communication technology and the disclosure of certain medical information by electronic transmission. I acknowledge that I have been given the opportunity to request an in-person assessment or other available alternative prior to the telemedicine visit and am voluntarily participating in the telemedicine visit.  I understand that I have the right to withhold or withdraw my consent to the use of telemedicine in the course of my care at any time, without affecting my right to future care or treatment, and that the Practitioner or I may terminate the telemedicine visit at any time. I understand that I have the right to inspect all information obtained and/or recorded in the course of the telemedicine visit and may receive copies of available information for a reasonable fee.  I understand that some of the potential risks of receiving the Services via telemedicine include:  Delay or interruption in medical evaluation due to technological equipment failure or disruption; Information transmitted may not be  sufficient (e.g. poor resolution of images) to allow for appropriate medical decision making by the Practitioner; and/or  In rare instances, security protocols could fail, causing a breach of personal health information.  Furthermore, I acknowledge that it is my responsibility to provide information about my medical history, conditions and care that is complete and accurate to the best of my ability. I acknowledge that Practitioner's advice, recommendations, and/or decision may be based on factors not within their control, such as incomplete or inaccurate data provided by me or distortions of diagnostic images or specimens that may result from electronic transmissions. I understand that the practice of medicine is not an exact science and that Practitioner makes no warranties or guarantees regarding treatment outcomes. I acknowledge that a copy of this consent can be made available to me via my patient portal ([place]), or I can request a printed copy by calling the office of [place].    I understand that my insurance will be billed for this visit.   I have read or had this consent read to me. I understand the contents of this consent, which adequately explains the benefits and risks of the Services being provided via telemedicine.  I have been provided ample opportunity to ask questions regarding this consent and the Services and have had my questions answered to my satisfaction. I give my informed consent for the services to be provided through the use of telemedicine in my medical care

## 2021-11-08 NOTE — Telephone Encounter (Signed)
Pt's wife tells me the procedure is 12/04/21. I thanked her for the surgery date as it came across to cardiologist as TBD. I will update the requesting office the pt has tele pre op appt 11/14/21

## 2021-11-14 ENCOUNTER — Ambulatory Visit (INDEPENDENT_AMBULATORY_CARE_PROVIDER_SITE_OTHER): Payer: Medicare Other | Admitting: Physician Assistant

## 2021-11-14 DIAGNOSIS — Z01810 Encounter for preprocedural cardiovascular examination: Secondary | ICD-10-CM

## 2021-11-14 NOTE — Progress Notes (Signed)
Virtual Visit via Telephone Note   Because of Carl Gutierrez's co-morbid illnesses, he is at least at moderate risk for complications without adequate follow up.  This format is felt to be most appropriate for this patient at this time.  The patient did not have access to video technology/had technical difficulties with video requiring transitioning to audio format only (telephone).  All issues noted in this document were discussed and addressed.  No physical exam could be performed with this format.  Please refer to the patient's chart for his consent to telehealth for Carl Gutierrez.  Evaluation Performed:  Preoperative cardiovascular risk assessment _____________   Date:  11/14/2021   Patient ID:  Carl Gutierrez, DOB 1946-02-03, MRN [id number] Patient Location:  Home Provider location:   Office  Primary Care Provider:  Wendie Agreste, MD Primary Cardiologist:  Glenetta Hew, MD  Chief Complaint / Patient Profile   76 y.o. y/o male with a h/o CAD, hypertension, hyperlipidemia who is pending left renal cryoablation and presents today for telephonic preoperative cardiovascular risk assessment.  Past Medical History    Past Medical History:  Diagnosis Date   CAD S/P percutaneous coronary angioplasty February 2004; 2008   PCI-dLAD: 2.25 mm x 16 mm Express BMS; Ramus- 2.75 mm x 18 mm Cypher DES (2.8 mm); follow-up 11/'08: Patent LAD/ramus stents. 90% small OM. EF 50-60%.; Myoview 1/'12: Exercise 9 min, 10 METS with no ischemia/infarction.    Clotting disorder (HCC)    Diabetes mellitus    Low-dose metformin   DJD (degenerative joint disease), lumbosacral     status post L4-L5 surgery   Essential hypertension    History of DVT of lower extremity     postoperative   Hyperlipidemia with target LDL less than 70    Hypothyroidism    Synthroid   Panhypopituitarism (Carl Gutierrez)    Status post pituitary adenoma removal in 2012   Recurrent deep vein thrombosis (DVT) (Carl Gutierrez) 10/01/2015   Past  Surgical History:  Procedure Laterality Date   BRAIN SURGERY  06/16/2010   Removal of pituitary adenoma   CARDIAC CATHETERIZATION  04/17/2007   Significant LAD tapering but no significant disease the distal stent. Patent Ramus stent. 90% lesion in small OM 2; small nondominant RCA. Medical therapy. EF 50-60%.   CORONARY ANGIOPLASTY WITH STENT PLACEMENT  07/17/2002   PCI-dLAD: 2.25 mm x 16 mm BMS;;PCI- RI: 2.75 mm 18 mm Cypher DES   IR RADIOLOGIST EVAL & MGMT  11/04/2021   NM MYOVIEW LTD  06/16/2010   a) Exercise ~9 min - 10 METS; no ischemia or infarction; b) 06/2021: LOW RISK.  Normal LV size and function.  EF 55 to 60%.  No RWMA.  No ischemia or infarct.  Fixed inferior basal apical defect with normal motion suggestive of artifact.  (Diaphragmatic attenuation)   PROSTATECTOMY N/A 08/13/2015   Procedure: TRANSVESICLE OPEN PROSTATECTOMY** ;  Surgeon: Carolan Clines, MD;  Location: [facility];  Service: Urology;  Laterality: N/A;   SPINE SURGERY     L4-L5    Allergies  Allergies  Allergen Reactions   Hydrochlorothiazide     Leg and side pain with this   Other     Blood pressure med name unknown.    History of Present Illness    Carl Gutierrez is a 76 y.o. male who presents via audio/video conferencing for a telehealth visit today.  Pt was last seen in cardiology clinic on 08/07/2021 by Dr. Ellyn Hack.  At that time Carl Gutierrez was doing well.  The patient is now pending procedure as outlined above. Since his last visit, he denies any chest pain or worsening dyspnea.   Home Medications    Prior to Admission medications   Medication Sig Start Date End Date Taking? Authorizing Provider  Alcohol Swabs (PHARMACIST CHOICE ALCOHOL) PADS SMARTSIG:1 Each Topical 4 Times Daily 07/17/21   [provider]  amLODipine (NORVASC) 10 MG tablet Take 1 tablet (10 mg total) by mouth daily. 06/19/21   Wendie Agreste, MD  aspirin 81 MG tablet Take 81 mg by mouth daily. Reported on 09/05/2015     [provider]  B-D UF III MINI PEN NEEDLES 31G X 5 MM MISC USE FOR INSULIN PEN TWICE A DAY 04/29/21   Elayne Snare, MD  colchicine 0.6 MG tablet 2 tablets at onset of foot pain, take third tablet after 1 hour and then 1 tablet daily with food until pain improved Patient not taking: Reported on 08/07/2021 04/24/21   Elayne Snare, MD  cyclobenzaprine (FLEXERIL) 5 MG tablet Take 1 tablet (5 mg total) by mouth 3 (three) times daily as needed for muscle spasms (start qhs prn due to sedation). Patient not taking: Reported on 08/07/2021 07/15/21   Wendie Agreste, MD  Dapagliflozin-metFORMIN HCl ER (XIGDUO XR) 10-998 MG TB24 Take 1 tablet by mouth daily. 08/22/21   Elayne Snare, MD  gabapentin (NEURONTIN) 100 MG capsule Take 1 capsule (100 mg total) by mouth 3 (three) times daily. Start QD for first few days, then twice per day. If needed after another week can increase to 3 times per day. Patient not taking: Reported on 09/26/2021 07/29/21   Wendie Agreste, MD  hydrocortisone (CORTEF) 20 MG tablet Take 10-20 mg by mouth See admin instructions. Takes 1 tablet 20 mg) by mouth in the morning and 1/2 tablet (10 mg )by mouth at night    [provider]  Insulin Lispro Prot & Lispro (HUMALOG 75/25 MIX) (75-25) 100 UNIT/ML Kwikpen INJECT 20 UNITS UNDER THE SKIN BEFORE BREAKFAST AND 6 UNITS BEFORE SUPPER. (REPLACES 70/30) 10/07/21   Elayne Snare, MD  levothyroxine (SYNTHROID) 88 MCG tablet Take 1 tablet (88 mcg total) by mouth daily. 06/19/21   Wendie Agreste, MD  metoprolol succinate (TOPROL-XL) 25 MG 24 hr tablet Take 1 tablet (25 mg total) by mouth daily. 06/19/21   Wendie Agreste, MD  nitroGLYCERIN (NITROSTAT) 0.4 MG SL tablet Place 1 tablet (0.4 mg total) under the tongue every 5 (five) minutes as needed for chest pain. 07/01/21 11/08/21  [place], Peter M, MD  [facility] VERIO test strip USE AS INSTRUCTED TO CHECK BLOOD SUGAR ONCE DAILY. 11/23/20   Elayne Snare, MD  predniSONE (DELTASONE) 20 MG tablet 3  by mouth for 3 days, then 2 by mouth for 2 days, then 1 by mouth for 2 days, then 1/2 by mouth for 2 days. 07/29/21   Wendie Agreste, MD  rosuvastatin (CRESTOR) 40 MG tablet TAKE 1 TABLET BY MOUTH EVERY DAY Patient taking differently: Take 40 mg by mouth every evening. 06/02/21   Wendie Agreste, MD  testosterone cypionate (DEPO-TESTOSTERONE) 200 MG/ML injection Inject 1.5 mLs (300 mg total) into the muscle every 21 ( twenty-one) days. 11/23/14 03/14/15  Elayne Snare, MD    Physical Exam    Vital Signs:  Carl Gutierrez does not have vital signs available for review today.  Given telephonic nature of communication, physical exam is limited. AAOx3. NAD. Normal affect.  Speech and respirations are unlabored.  Accessory  Clinical Findings    None  Assessment & Plan    1.  Preoperative Cardiovascular Risk Assessment:  -Patient has upcoming left renal cryoablation by Dr. Kathlene Cote.  He denies any recent chest pain worsening or shortness of breath.  Prior Myoview on 07/03/2021 was low risk.  If needed, he can hold aspirin for 7 days prior to the procedure and restart as soon as possible afterward at the discretion of his surgeon.  A copy of this note will be routed to requesting surgeon.  Time:   Today, I have spent 4 minutes with the patient with telehealth technology discussing medical history, symptoms, and management plan.     [place], [place]  11/14/2021, 11:31 AM

## 2021-11-21 ENCOUNTER — Other Ambulatory Visit: Payer: Self-pay | Admitting: Internal Medicine

## 2021-11-21 DIAGNOSIS — N2889 Other specified disorders of kidney and ureter: Secondary | ICD-10-CM

## 2021-11-21 NOTE — Progress Notes (Signed)
Sent message, via epic in basket, requesting order in epic from surgeon   Spoke with PA on the PA surgical line for IR.

## 2021-11-27 NOTE — Patient Instructions (Addendum)
DUE TO COVID-19 ONLY TWO VISITORS  (aged 78 and older)  ARE ALLOWED TO COME WITH YOU AND STAY IN THE WAITING ROOM ONLY DURING PRE OP AND PROCEDURE.   **NO VISITORS ARE ALLOWED IN THE SHORT STAY AREA OR RECOVERY ROOM!!**  IF YOU WILL BE ADMITTED INTO THE HOSPITAL YOU ARE ALLOWED ONLY FOUR SUPPORT PEOPLE DURING VISITATION HOURS ONLY (7 AM -8PM)   The support person(s) must pass our screening, gel in and out, and wear a mask at all times, including in the patient's room. Patients must also wear a mask when staff or their support person are in the room. Visitors GUEST BADGE MUST BE WORN VISIBLY  One adult visitor may remain with you overnight and MUST be in the room by 8 P.M.     Your procedure is scheduled on: 12/04/21   Report to [facility] Main Entrance    Report to admitting at: 9:30 AM   Call this number if you have problems the morning of surgery [phone]   Do not eat food :After Midnight.   After Midnight you may have the following liquids until : 8:30 AM DAY OF SURGERY  Water Black Coffee (sugar ok, NO MILK/CREAM OR CREAMERS)  Tea (sugar ok, NO MILK/CREAM OR CREAMERS) regular and decaf                             Plain Jell-O (NO RED)                                           Fruit ices (not with fruit pulp, NO RED)                                     Popsicles (NO RED)                                                                  Juice: apple, WHITE grape, WHITE cranberry Sports drinks like Gatorade (NO RED) Clear broth(vegetable,chicken,beef)    Oral Hygiene is also important to reduce your risk of infection.                                    Remember - BRUSH YOUR TEETH THE MORNING OF SURGERY WITH YOUR REGULAR TOOTHPASTE   Do NOT smoke after Midnight   Take these medicines the morning of surgery with A SIP OF WATER: metoprolol,amlodipine,synthroid.Colchicine as needed.  How to Manage Your Diabetes Before and After Surgery  Why is it important to  control my blood sugar before and after surgery? Improving blood sugar levels before and after surgery helps healing and can limit problems. A way of improving blood sugar control is eating a healthy diet by:  Eating less sugar and carbohydrates  Increasing activity/exercise  Talking with your doctor about reaching your blood sugar goals High blood sugars (greater than 180 mg/dL) can raise your risk of infections and slow your recovery, so you will need  to focus on controlling your diabetes during the weeks before surgery. Make sure that the doctor who takes care of your diabetes knows about your planned surgery including the date and location.  How do I manage my blood sugar before surgery? Check your blood sugar at least 4 times a day, starting 2 days before surgery, to make sure that the level is not too high or low. Check your blood sugar the morning of your surgery when you wake up and every 2 hours until you get to the Short Stay unit. If your blood sugar is less than 70 mg/dL, you will need to treat for low blood sugar: Do not take insulin. Treat a low blood sugar (less than 70 mg/dL) with  cup of clear juice (cranberry or apple), 4 glucose tablets, OR glucose gel. Recheck blood sugar in 15 minutes after treatment (to make sure it is greater than 70 mg/dL). If your blood sugar is not greater than 70 mg/dL on recheck, call [phone] for further instructions. Report your blood sugar to the short stay nurse when you get to Short Stay.  If you are admitted to the hospital after surgery: Your blood sugar will be checked by the staff and you will probably be given insulin after surgery (instead of oral diabetes medicines) to make sure you have good blood sugar levels. The goal for blood sugar control after surgery is 80-180 mg/dL.   WHAT DO I DO ABOUT MY DIABETES MEDICATION?  Do not take oral diabetes medicines (pills) the morning of surgery.  THE DAY BEFORE SURGERY, DO NOT take  dapaglifozin. Take ONLY half of the lispro insulin night dose.       THE MORNING OF SURGERY, take half of the lispro insulin if your blood sugar is more than 220. DO NOT TAKE ANY ORAL DIABETIC MEDICATIONS DAY   OF YOUR SURGERY.  Bring CPAP mask and tubing day of surgery.                              You may not have any metal on your body including hair pins, jewelry, and body piercing             Do not wear lotions, powders, perfumes/cologne, or deodorant              Men may shave face and neck.   Do not bring valuables to the hospital. [place].   Contacts, dentures or bridgework may not be worn into surgery.   Bring small overnight bag day of surgery.   DO NOT [place]. PHARMACY WILL DISPENSE MEDICATIONS LISTED ON YOUR MEDICATION LIST TO YOU DURING YOUR ADMISSION [place]!    Patients discharged on the day of surgery will not be allowed to drive home.  Someone NEEDS to stay with you for the first 24 hours after anesthesia.   Special Instructions: Bring a copy of your healthcare power of attorney and living will documents         the day of surgery if you haven't scanned them before.              Please read over the following fact sheets you were given: IF YOU HAVE QUESTIONS ABOUT YOUR PRE-OP INSTRUCTIONS PLEASE CALL [place] - Preparing  for Surgery Before surgery, you can play an important role.  Because skin is not sterile, your skin needs to be as free of germs as possible.  You can reduce the number of germs on your skin by washing with CHG (chlorahexidine gluconate) soap before surgery.  CHG is an antiseptic cleaner which kills germs and bonds with the skin to continue killing germs even after washing. Please DO NOT use if you have an allergy to CHG or antibacterial soaps.  If your skin becomes reddened/irritated stop using the CHG and inform your nurse when you  arrive at Short Stay. Do not shave (including legs and underarms) for at least 48 hours prior to the first CHG shower.  You may shave your face/neck. Please follow these instructions carefully:  1.  Shower with CHG Soap the night before surgery and the  morning of Surgery.  2.  If you choose to wash your hair, wash your hair first as usual with your  normal  shampoo.  3.  After you shampoo, rinse your hair and body thoroughly to remove the  shampoo.                           4.  Use CHG as you would any other liquid soap.  You can apply chg directly  to the skin and wash                       Gently with a scrungie or clean washcloth.  5.  Apply the CHG Soap to your body ONLY FROM THE NECK DOWN.   Do not use on face/ open                           Wound or open sores. Avoid contact with eyes, ears mouth and genitals (private parts).                       Wash face,  Genitals (private parts) with your normal soap.             6.  Wash thoroughly, paying special attention to the area where your surgery  will be performed.  7.  Thoroughly rinse your body with warm water from the neck down.  8.  DO NOT shower/wash with your normal soap after using and rinsing off  the CHG Soap.                9.  Pat yourself dry with a clean towel.            10.  Wear clean pajamas.            11.  Place clean sheets on your bed the night of your first shower and do not  sleep with pets. Day of Surgery : Do not apply any lotions/deodorants the morning of surgery.  Please wear clean clothes to the hospital/surgery center.  FAILURE TO FOLLOW THESE INSTRUCTIONS MAY RESULT IN THE CANCELLATION OF YOUR SURGERY PATIENT SIGNATURE_________________________________  NURSE SIGNATURE__________________________________  ________________________________________________________________________

## 2021-11-28 ENCOUNTER — Other Ambulatory Visit: Payer: Self-pay

## 2021-11-28 ENCOUNTER — Encounter (HOSPITAL_COMMUNITY)
Admission: RE | Admit: 2021-11-28 | Discharge: 2021-11-28 | Disposition: A | Discharge status: Home / Self Care | Payer: Medicare Other | Source: Ambulatory Visit | Attending: Interventional Radiology | Admitting: Interventional Radiology

## 2021-11-28 ENCOUNTER — Encounter (HOSPITAL_COMMUNITY): Payer: Self-pay

## 2021-11-28 VITALS — BP 134/75 | HR 85 | Temp 98.3°F | Ht 71.0 in | Wt 233.0 lb

## 2021-11-28 DIAGNOSIS — Z01812 Encounter for preprocedural laboratory examination: Secondary | ICD-10-CM | POA: Insufficient documentation

## 2021-11-28 DIAGNOSIS — N2889 Other specified disorders of kidney and ureter: Secondary | ICD-10-CM | POA: Insufficient documentation

## 2021-11-28 DIAGNOSIS — E119 Type 2 diabetes mellitus without complications: Secondary | ICD-10-CM

## 2021-11-28 LAB — HEMOGLOBIN A1C
Hgb A1c MFr Bld: 6.9 % — ABNORMAL HIGH (ref 4.8–5.6)
Mean Plasma Glucose: 151.33 mg/dL

## 2021-11-28 LAB — BASIC METABOLIC PANEL
Anion gap: 8 (ref 5–15)
BUN: 24 mg/dL — ABNORMAL HIGH (ref 8–23)
CO2: 22 mmol/L (ref 22–32)
Calcium: 9.3 mg/dL (ref 8.9–10.3)
Chloride: 111 mmol/L (ref 98–111)
Creatinine, Ser: 1.8 mg/dL — ABNORMAL HIGH (ref 0.61–1.24)
GFR, Estimated: 38 mL/min — ABNORMAL LOW (ref >60)
Glucose, Bld: 183 mg/dL — ABNORMAL HIGH (ref 70–99)
Potassium: 4.4 mmol/L (ref 3.5–5.1)
Sodium: 141 mmol/L (ref 135–145)

## 2021-11-28 LAB — PROTIME-INR
INR: 1.1 (ref 0.8–1.2)
Prothrombin Time: 13.8 seconds (ref 11.4–15.2)

## 2021-11-28 LAB — CBC WITH DIFFERENTIAL/PLATELET
Abs Immature Granulocytes: 0.03 10*3/uL (ref 0.00–0.07)
Basophils Absolute: 0.1 10*3/uL (ref 0.0–0.1)
Basophils Relative: 1 %
Eosinophils Absolute: 0.2 10*3/uL (ref 0.0–0.5)
Eosinophils Relative: 2 %
HCT: 41 % (ref 39.0–52.0)
Hemoglobin: 13.1 g/dL (ref 13.0–17.0)
Immature Granulocytes: 0 %
Lymphocytes Relative: 14 %
Lymphs Abs: 1.3 10*3/uL (ref 0.7–4.0)
MCH: 28.1 pg (ref 26.0–34.0)
MCHC: 32 g/dL (ref 30.0–36.0)
MCV: 88 fL (ref 80.0–100.0)
Monocytes Absolute: 1 10*3/uL (ref 0.1–1.0)
Monocytes Relative: 10 %
Neutro Abs: 6.8 10*3/uL (ref 1.7–7.7)
Neutrophils Relative %: 73 %
Platelets: 371 10*3/uL (ref 150–400)
RBC: 4.66 MIL/uL (ref 4.22–5.81)
RDW: 14.6 % (ref 11.5–15.5)
WBC: 9.3 10*3/uL (ref 4.0–10.5)
nRBC: 0 % (ref 0.0–0.2)

## 2021-11-28 LAB — GLUCOSE, CAPILLARY: Glucose-Capillary: 229 mg/dL — ABNORMAL HIGH (ref 70–99)

## 2021-11-28 NOTE — Progress Notes (Signed)
For Short Stay: [place] appointment date: Date of COVID positive in last 62 days:  Bowel Prep reminder:   For Anesthesia: PCP - Dr. [name]. Cardiologist - Dr. Nani Skillern. Clearance: Almyra Deforest: PA: 11/14/21: EPIC  Chest x-ray - 07/08/21 EKG -  Stress Test -  ECHO -  Cardiac Cath - 05/05/07 Pacemaker/ICD device last checked: Pacemaker orders received: Device Rep notified:  Spinal Cord Stimulator:  Sleep Study -  CPAP -   Fasting Blood Sugar - 120's Checks Blood Sugar __2___ times a day Date and result of last Hgb A1c-8.1 : 08/20/21  Blood Thinner Instructions: Aspirin Instructions:Will be held on: 11/30/21 Last Dose:  Activity level: Can go up a flight of stairs and activities of daily living without stopping and without chest pain and/or shortness of breath   Able to exercise without chest pain and/or shortness of breath   Unable to go up a flight of stairs without chest pain and/or shortness of breath     Anesthesia review: Hx: HTN,DIA,DVT,CAD,CKD III  Patient denies shortness of breath, fever, cough and chest pain at PAT appointment   Patient verbalized understanding of instructions that were given to them at the PAT appointment. Patient was also instructed that they will need to review over the PAT instructions again at home before surgery.

## 2021-11-28 NOTE — Progress Notes (Signed)
Labs results: PT: 15.6 Creatinine: 1.80

## 2021-11-29 ENCOUNTER — Other Ambulatory Visit: Payer: Self-pay | Admitting: Family Medicine

## 2021-11-29 ENCOUNTER — Other Ambulatory Visit: Payer: Self-pay | Admitting: Endocrinology

## 2021-11-29 ENCOUNTER — Telehealth: Payer: Self-pay | Admitting: Family Medicine

## 2021-11-29 DIAGNOSIS — I10 Essential (primary) hypertension: Secondary | ICD-10-CM

## 2021-11-29 DIAGNOSIS — E785 Hyperlipidemia, unspecified: Secondary | ICD-10-CM

## 2021-11-29 NOTE — Telephone Encounter (Signed)
Spoke w/ pt wife and she states her husband needs his One touch Verio test strips reordered . After looking DR Dwyane Dee ordered those not DR Carlota Raspberry . She is calling their office for the refill

## 2021-11-29 NOTE — Telephone Encounter (Signed)
Pt wife calling wanting to talk to a nurse about her husband.

## 2021-12-02 NOTE — Progress Notes (Signed)
Anesthesia Chart Review   Case: [id number] Date/Time: 12/04/21 1130   Procedure: LEFT RENAL CRYO ABLATION   Anesthesia type: General   Pre-op diagnosis: LEFT RENAL MASS   [facility]   Surgeons: Aletta Edouard, MD       DISCUSSION:76 y.o. former smoker with h/o HTN, hypothyroidism, DVT, CAD, DM II, left renal mass scheduled for above procedure 12/04/2021 with Dr. Aletta Edouard.   Pt last seen by cardiology 11/14/2021. Per OV note, "Patient has upcoming left renal cryoablation by Dr. Kathlene Cote.  He denies any recent chest pain worsening or shortness of breath.  Prior Myoview on 07/03/2021 was low risk.  If needed, he can hold aspirin for 7 days prior to the procedure and restart as soon as possible afterward at the discretion of his surgeon."  Anticipate pt can proceed with planned procedure barring acute status change.   VS: BP 134/75   Pulse 85   Temp 36.8 C (Oral)   Ht '5\' 11"'$  (1.803 m)   Wt 105.7 kg   SpO2 100%   BMI 32.50 kg/m   PROVIDERS: Wendie Agreste, MD is PCP   Primary Cardiologist:  Glenetta Hew, MD  LABS: Labs reviewed: Acceptable for surgery. (all labs ordered are listed, but only abnormal results are displayed)  Labs Reviewed  HEMOGLOBIN A1C - Abnormal; Notable for the following components:      Result Value   Hgb A1c MFr Bld 6.9 (*)    All other components within normal limits  BASIC METABOLIC PANEL - Abnormal; Notable for the following components:   Glucose, Bld 183 (*)    BUN 24 (*)    Creatinine, Ser 1.80 (*)    GFR, Estimated 38 (*)    All other components within normal limits  GLUCOSE, CAPILLARY - Abnormal; Notable for the following components:   Glucose-Capillary 229 (*)    All other components within normal limits  CBC WITH DIFFERENTIAL/PLATELET  PROTIME-INR  TYPE AND SCREEN     IMAGES:   EKG: 08/07/21 Rate 61 bpm  Sinus rhythm with premature atrial complexes  CV: Myocardial Perfusion 07/03/2021   No ST deviation was  noted.   LV perfusion is abnormal. There is no evidence of ischemia. There is no evidence of infarction. Defect 1: There is a small defect with mild reduction in uptake present in the apical to basal inferior location(s) that is fixed. There is normal wall motion in the defect area. Consistent with artifact caused by diaphragmatic attenuation.   Left ventricular function is normal. End diastolic cavity size is normal. End systolic cavity size is normal.   The study is normal. The study is low risk. Past Medical History:  Diagnosis Date   CAD S/P percutaneous coronary angioplasty February 2004; 2008   PCI-dLAD: 2.25 mm x 16 mm Express BMS; Ramus- 2.75 mm x 18 mm Cypher DES (2.8 mm); follow-up 11/'08: Patent LAD/ramus stents. 90% small OM. EF 50-60%.; Myoview 1/'12: Exercise 9 min, 10 METS with no ischemia/infarction.    Clotting disorder (HCC)    Diabetes mellitus    Low-dose metformin   DJD (degenerative joint disease), lumbosacral     status post L4-L5 surgery   Essential hypertension    History of DVT of lower extremity     postoperative   Hyperlipidemia with target LDL less than 70    Hypothyroidism    Synthroid   Panhypopituitarism ([place])    Status post pituitary adenoma removal in 2012   Recurrent deep vein  thrombosis (DVT) ([place]) 10/01/2015    Past Surgical History:  Procedure Laterality Date   ARTHROSCOPY KNEE W/ DRILLING Left    BRAIN SURGERY  06/16/2010   Removal of pituitary adenoma   CARDIAC CATHETERIZATION  04/17/2007   Significant LAD tapering but no significant disease the distal stent. Patent Ramus stent. 90% lesion in small OM 2; small nondominant RCA. Medical therapy. EF 50-60%.   CORONARY ANGIOPLASTY WITH STENT PLACEMENT  07/17/2002   PCI-dLAD: 2.25 mm x 16 mm BMS;;PCI- RI: 2.75 mm 18 mm Cypher DES   IR RADIOLOGIST EVAL & MGMT  11/04/2021   NM MYOVIEW LTD  06/16/2010   a) Exercise ~9 min - 10 METS; no ischemia or infarction; b) 06/2021: LOW RISK.  Normal LV size and  function.  EF 55 to 60%.  No RWMA.  No ischemia or infarct.  Fixed inferior basal apical defect with normal motion suggestive of artifact.  (Diaphragmatic attenuation)   PROSTATECTOMY N/A 08/13/2015   Procedure: TRANSVESICLE OPEN PROSTATECTOMY** ;  Surgeon: Carolan Clines, MD;  Location[facility];  Service: Urology;  Laterality: N/A;   SPINE SURGERY     L4-L5    MEDICATIONS:  Alcohol Swabs (PHARMACIST CHOICE ALCOHOL) PADS   amLODipine (NORVASC) 10 MG tablet   aspirin 81 MG tablet   B-D UF III MINI PEN NEEDLES 31G X 5 MM MISC   cyclobenzaprine (FLEXERIL) 5 MG tablet   gabapentin (NEURONTIN) 100 MG capsule   hydrocortisone (CORTEF) 20 MG tablet   Insulin Lispro Prot & Lispro (HUMALOG 75/25 MIX) (75-25) 100 UNIT/ML Kwikpen   levothyroxine (SYNTHROID) 88 MCG tablet   metoprolol succinate (TOPROL-XL) 25 MG 24 hr tablet   nitroGLYCERIN (NITROSTAT) 0.4 MG SL tablet   ONETOUCH VERIO test strip   predniSONE (DELTASONE) 20 MG tablet   rosuvastatin (CRESTOR) 40 MG tablet   XIGDUO XR 10-998 MG TB24   No current facility-administered medications for this encounter.    Konrad Felix Ward, PA-C [facility] [phone]

## 2021-12-02 NOTE — Anesthesia Preprocedure Evaluation (Signed)
Anesthesia Evaluation  Patient identified by MRN, date of birth, ID band Patient awake    Reviewed: Allergy & Precautions, NPO status , Patient's Chart, lab work & pertinent test results  Airway Mallampati: I  TM Distance: >3 FB Neck ROM: Full    Dental  (+) Teeth Intact, Dental Advisory Given   Pulmonary former smoker,    breath sounds clear to auscultation       Cardiovascular hypertension, + CAD and + Cardiac Stents   Rhythm:Regular Rate:Normal  Perfusion Study: Left ventricular function is normal. Nuclear stress EF: 57 %. End diastolic cavity size is normal. End systolic cavity size is normal. No evidence of transient ischemic dilation    Neuro/Psych negative neurological ROS  negative psych ROS   GI/Hepatic negative GI ROS, Neg liver ROS,   Endo/Other  diabetesHypothyroidism   Renal/GU Renal disease     Musculoskeletal  (+) Arthritis ,   Abdominal Normal abdominal exam  (+)   Peds  Hematology   Anesthesia Other Findings   Reproductive/Obstetrics                           Anesthesia Physical Anesthesia Plan  ASA: 3  Anesthesia Plan: General   Post-op Pain Management:    Induction: Intravenous  PONV Risk Score and Plan: 3 and Ondansetron, Dexamethasone and Midazolam  Airway Management Planned: Oral ETT  Additional Equipment: None  Intra-op Plan:   Post-operative Plan: Extubation in OR  Informed Consent:   Plan Discussed with: CRNA  Anesthesia Plan Comments: (See PAT note 11/28/2021)       Anesthesia Quick Evaluation

## 2021-12-03 ENCOUNTER — Other Ambulatory Visit: Payer: Self-pay | Admitting: Student

## 2021-12-03 ENCOUNTER — Other Ambulatory Visit: Payer: Self-pay | Admitting: Internal Medicine

## 2021-12-04 ENCOUNTER — Encounter (HOSPITAL_COMMUNITY): Payer: Self-pay

## 2021-12-04 ENCOUNTER — Encounter (HOSPITAL_COMMUNITY)
Admission: RE | Disposition: A | Discharge status: Home / Self Care | Payer: Self-pay | Source: Ambulatory Visit | Attending: Interventional Radiology

## 2021-12-04 ENCOUNTER — Ambulatory Visit (HOSPITAL_BASED_OUTPATIENT_CLINIC_OR_DEPARTMENT_OTHER): Payer: Medicare Other | Admitting: Certified Registered Nurse Anesthetist

## 2021-12-04 ENCOUNTER — Encounter (HOSPITAL_COMMUNITY): Payer: Self-pay | Admitting: Interventional Radiology

## 2021-12-04 ENCOUNTER — Observation Stay (HOSPITAL_COMMUNITY)
Admission: RE | Admit: 2021-12-04 | Discharge: 2021-12-04 | Disposition: A | Discharge status: Home / Self Care | Payer: Medicare Other | Source: Ambulatory Visit | Attending: Interventional Radiology | Admitting: Interventional Radiology

## 2021-12-04 ENCOUNTER — Ambulatory Visit (HOSPITAL_COMMUNITY): Payer: Medicare Other | Admitting: Physician Assistant

## 2021-12-04 ENCOUNTER — Other Ambulatory Visit: Payer: Self-pay

## 2021-12-04 ENCOUNTER — Observation Stay (HOSPITAL_COMMUNITY)
Admission: RE | Admit: 2021-12-04 | Discharge: 2021-12-05 | Disposition: A | Discharge status: Home / Self Care | Payer: Medicare Other | Source: Ambulatory Visit | Attending: Interventional Radiology | Admitting: Interventional Radiology

## 2021-12-04 DIAGNOSIS — E119 Type 2 diabetes mellitus without complications: Secondary | ICD-10-CM | POA: Diagnosis not present

## 2021-12-04 DIAGNOSIS — C642 Malignant neoplasm of left kidney, except renal pelvis: Secondary | ICD-10-CM | POA: Diagnosis not present

## 2021-12-04 DIAGNOSIS — Z79899 Other long term (current) drug therapy: Secondary | ICD-10-CM | POA: Diagnosis not present

## 2021-12-04 DIAGNOSIS — I2510 Atherosclerotic heart disease of native coronary artery without angina pectoris: Secondary | ICD-10-CM | POA: Insufficient documentation

## 2021-12-04 DIAGNOSIS — I10 Essential (primary) hypertension: Secondary | ICD-10-CM | POA: Insufficient documentation

## 2021-12-04 DIAGNOSIS — Z794 Long term (current) use of insulin: Secondary | ICD-10-CM | POA: Insufficient documentation

## 2021-12-04 DIAGNOSIS — N2889 Other specified disorders of kidney and ureter: Secondary | ICD-10-CM | POA: Diagnosis present

## 2021-12-04 DIAGNOSIS — Z87891 Personal history of nicotine dependence: Secondary | ICD-10-CM | POA: Diagnosis not present

## 2021-12-04 DIAGNOSIS — Z955 Presence of coronary angioplasty implant and graft: Secondary | ICD-10-CM | POA: Diagnosis not present

## 2021-12-04 DIAGNOSIS — Z86718 Personal history of other venous thrombosis and embolism: Secondary | ICD-10-CM | POA: Insufficient documentation

## 2021-12-04 DIAGNOSIS — E039 Hypothyroidism, unspecified: Secondary | ICD-10-CM | POA: Insufficient documentation

## 2021-12-04 HISTORY — PX: RADIOLOGY WITH ANESTHESIA: SHX6223

## 2021-12-04 LAB — TYPE AND SCREEN
ABO/RH(D): O POS
Antibody Screen: NEGATIVE

## 2021-12-04 LAB — GLUCOSE, CAPILLARY
Glucose-Capillary: 134 mg/dL — ABNORMAL HIGH (ref 70–99)
Glucose-Capillary: 143 mg/dL — ABNORMAL HIGH (ref 70–99)
Glucose-Capillary: 374 mg/dL — ABNORMAL HIGH (ref 70–99)

## 2021-12-04 SURGERY — RADIOLOGY WITH ANESTHESIA
Anesthesia: General

## 2021-12-04 MED ORDER — ROSUVASTATIN CALCIUM 20 MG PO TABS
40.0000 mg | ORAL_TABLET | Freq: Every day | ORAL | Status: DC
  Administered 2021-12-05: 40 mg via ORAL
  Filled 2021-12-04: qty 2

## 2021-12-04 MED ORDER — ORAL CARE MOUTH RINSE
15.0000 mL | Freq: Once | OROMUCOSAL | Status: AC
  Administered 2021-12-04: 15 mL via OROMUCOSAL

## 2021-12-04 MED ORDER — INSULIN ASPART 100 UNIT/ML IJ SOLN
0.0000 [IU] | Freq: Three times a day (TID) | INTRAMUSCULAR | Status: DC
  Administered 2021-12-05: 3 [IU] via SUBCUTANEOUS
  Administered 2021-12-05: 2 [IU] via SUBCUTANEOUS

## 2021-12-04 MED ORDER — INSULIN ASPART PROT & ASPART (70-30 MIX) 100 UNIT/ML ~~LOC~~ SUSP
26.0000 [IU] | Freq: Every day | SUBCUTANEOUS | Status: DC
  Administered 2021-12-05: 26 [IU] via SUBCUTANEOUS

## 2021-12-04 MED ORDER — ONDANSETRON HCL 4 MG/2ML IJ SOLN
INTRAMUSCULAR | Status: DC | PRN
  Administered 2021-12-04: 4 mg via INTRAVENOUS

## 2021-12-04 MED ORDER — LEVOTHYROXINE SODIUM 88 MCG PO TABS
88.0000 ug | ORAL_TABLET | Freq: Every day | ORAL | Status: DC
Start: 2021-12-05 — End: 2021-12-05
  Administered 2021-12-05: 88 ug via ORAL
  Filled 2021-12-04: qty 1

## 2021-12-04 MED ORDER — DOCUSATE SODIUM 100 MG PO CAPS
100.0000 mg | ORAL_CAPSULE | Freq: Two times a day (BID) | ORAL | Status: DC
  Administered 2021-12-05: 100 mg via ORAL
  Filled 2021-12-04: qty 1

## 2021-12-04 MED ORDER — SODIUM CHLORIDE 0.9 % IV SOLN: INTRAVENOUS | Status: DC

## 2021-12-04 MED ORDER — INSULIN ASPART PROT & ASPART (70-30 MIX) 100 UNIT/ML ~~LOC~~ SUSP
8.0000 [IU] | Freq: Every day | SUBCUTANEOUS | Status: DC
  Filled 2021-12-04: qty 10

## 2021-12-04 MED ORDER — METOPROLOL SUCCINATE ER 25 MG PO TB24
25.0000 mg | ORAL_TABLET | Freq: Every day | ORAL | Status: DC
  Filled 2021-12-04: qty 1

## 2021-12-04 MED ORDER — SUGAMMADEX SODIUM 200 MG/2ML IV SOLN
INTRAVENOUS | Status: DC | PRN
  Administered 2021-12-04: 400 mg via INTRAVENOUS

## 2021-12-04 MED ORDER — FENTANYL CITRATE (PF) 250 MCG/5ML IJ SOLN
INTRAMUSCULAR | Status: AC
  Filled 2021-12-04: qty 5

## 2021-12-04 MED ORDER — EPHEDRINE SULFATE (PRESSORS) 50 MG/ML IJ SOLN
INTRAMUSCULAR | Status: DC | PRN
  Administered 2021-12-04 (×2): 5 mg via INTRAVENOUS

## 2021-12-04 MED ORDER — SODIUM CHLORIDE 0.9 % IV SOLN
INTRAVENOUS | Status: AC
  Filled 2021-12-04: qty 500

## 2021-12-04 MED ORDER — DAPAGLIFLOZIN PROPANEDIOL 5 MG PO TABS
5.0000 mg | ORAL_TABLET | Freq: Every day | ORAL | Status: DC
  Administered 2021-12-05: 5 mg via ORAL
  Filled 2021-12-04: qty 1

## 2021-12-04 MED ORDER — INSULIN LISPRO PROT & LISPRO (75-25 MIX) 100 UNIT/ML KWIKPEN: 18.0000 [IU] | PEN_INJECTOR | SUBCUTANEOUS | Status: DC

## 2021-12-04 MED ORDER — DEXAMETHASONE SODIUM PHOSPHATE 4 MG/ML IJ SOLN
INTRAMUSCULAR | Status: DC | PRN
  Administered 2021-12-04: 8 mg via INTRAVENOUS

## 2021-12-04 MED ORDER — HYDROCODONE-ACETAMINOPHEN 5-325 MG PO TABS: 1.0000 | ORAL_TABLET | ORAL | Status: DC | PRN

## 2021-12-04 MED ORDER — LIDOCAINE 2% (20 MG/ML) 5 ML SYRINGE
INTRAMUSCULAR | Status: DC | PRN
  Administered 2021-12-04: 80 mg via INTRAVENOUS

## 2021-12-04 MED ORDER — ROCURONIUM BROMIDE 10 MG/ML (PF) SYRINGE
PREFILLED_SYRINGE | INTRAVENOUS | Status: DC | PRN
  Administered 2021-12-04: 20 mg via INTRAVENOUS
  Administered 2021-12-04: 60 mg via INTRAVENOUS

## 2021-12-04 MED ORDER — DAPAGLIFLOZIN PRO-METFORMIN ER 5-1000 MG PO TB24: 1.0000 | ORAL_TABLET | Freq: Every day | ORAL | Status: DC

## 2021-12-04 MED ORDER — PHENYLEPHRINE 80 MCG/ML (10ML) SYRINGE FOR IV PUSH (FOR BLOOD PRESSURE SUPPORT)
PREFILLED_SYRINGE | INTRAVENOUS | Status: DC | PRN
  Administered 2021-12-04 (×2): 80 ug via INTRAVENOUS
  Administered 2021-12-04: 120 ug via INTRAVENOUS
  Administered 2021-12-04: 160 ug via INTRAVENOUS

## 2021-12-04 MED ORDER — FENTANYL CITRATE (PF) 100 MCG/2ML IJ SOLN
INTRAMUSCULAR | Status: AC
  Filled 2021-12-04: qty 2

## 2021-12-04 MED ORDER — SODIUM CHLORIDE 0.9 % IV SOLN
2.0000 g | INTRAVENOUS | Status: AC
  Administered 2021-12-04: 2 g via INTRAVENOUS
  Filled 2021-12-04: qty 2

## 2021-12-04 MED ORDER — METFORMIN HCL ER 500 MG PO TB24
1000.0000 mg | ORAL_TABLET | Freq: Every day | ORAL | Status: DC
  Administered 2021-12-05: 1000 mg via ORAL
  Filled 2021-12-04: qty 2

## 2021-12-04 MED ORDER — CHLORHEXIDINE GLUCONATE 0.12 % MT SOLN: 15.0000 mL | Freq: Once | OROMUCOSAL | Status: AC

## 2021-12-04 MED ORDER — FENTANYL CITRATE (PF) 100 MCG/2ML IJ SOLN
INTRAMUSCULAR | Status: DC | PRN
  Administered 2021-12-04 (×3): 50 ug via INTRAVENOUS

## 2021-12-04 MED ORDER — ONDANSETRON HCL 4 MG/2ML IJ SOLN: 4.0000 mg | Freq: Four times a day (QID) | INTRAMUSCULAR | Status: DC | PRN

## 2021-12-04 MED ORDER — NITROGLYCERIN 0.4 MG SL SUBL
0.4000 mg | SUBLINGUAL_TABLET | SUBLINGUAL | Status: DC | PRN
Start: 2021-12-04 — End: 2021-12-05

## 2021-12-04 MED ORDER — PROPOFOL 10 MG/ML IV BOLUS
INTRAVENOUS | Status: DC | PRN
  Administered 2021-12-04: 150 mg via INTRAVENOUS

## 2021-12-04 MED ORDER — SENNOSIDES-DOCUSATE SODIUM 8.6-50 MG PO TABS: 1.0000 | ORAL_TABLET | Freq: Every day | ORAL | Status: DC | PRN

## 2021-12-04 MED ORDER — LACTATED RINGERS IV SOLN: INTRAVENOUS | Status: DC

## 2021-12-04 MED ORDER — AMLODIPINE BESYLATE 10 MG PO TABS
10.0000 mg | ORAL_TABLET | Freq: Every day | ORAL | Status: DC
  Administered 2021-12-05: 10 mg via ORAL
  Filled 2021-12-04: qty 1

## 2021-12-04 NOTE — Transfer of Care (Signed)
Immediate Anesthesia Transfer of Care Note  Patient: Carl Gutierrez  Procedure(s) Performed: Procedure(s): LEFT RENAL CRYO ABLATION (N/A)  Patient Location: PACU  Anesthesia Type:General  Level of Consciousness: Patient easily awoken, sedated, comfortable, cooperative, following commands, responds to stimulation.   Airway & Oxygen Therapy: Patient spontaneously breathing, ventilating well, oxygen via simple oxygen mask.  Post-op Assessment: Report given to PACU RN, vital signs reviewed and stable, moving all extremities.   Post vital signs: Reviewed and stable.  Complications: No apparent anesthesia complications  Last Vitals:  Vitals Value Taken Time  BP 135/84 12/04/21 1526  Temp    Pulse 64 12/04/21 1529  Resp 17 12/04/21 1527  SpO2 100 % 12/04/21 1529  Vitals shown include unvalidated device data.  Last Pain:  Vitals:   12/04/21 0946  TempSrc:   PainSc: 0-No pain         Complications: No notable events documented.

## 2021-12-04 NOTE — Sedation Documentation (Signed)
Anesthesia in to sedate and monitor. 

## 2021-12-04 NOTE — Procedures (Signed)
Interventional Radiology Procedure Note  Procedure: Ultrasound and CT guided biopsy and cryoablation of left renal mass  Anesthesia: General  Complications: None  Estimated Blood Loss: < 10 mL  Findings: Roughly 1.8 cm exophytic posterior left renal mass sampled via 17 G needle with 18 G core device x 1.   Cryoablation of mass with 2.4 G Varian cryoablation probe. Good iceball formation encompassing mass.  Plan: PACU recovery followed by overnight observation.  Venetia Night. Kathlene Cote, M.D Pager:  [phone]

## 2021-12-04 NOTE — Anesthesia Procedure Notes (Signed)
Procedure Name: Intubation Date/Time: 12/04/2021 12:59 PM  Performed by: Deliah Boston, CRNAPre-anesthesia Checklist: Patient identified, Emergency Drugs available, Suction available and Patient being monitored Patient Re-evaluated:Patient Re-evaluated prior to induction Oxygen Delivery Method: Circle system utilized Preoxygenation: Pre-oxygenation with 100% oxygen Induction Type: IV induction Ventilation: Mask ventilation without difficulty Laryngoscope Size: Mac and 4 Grade View: Grade I Tube type: Oral Number of attempts: 1 Airway Equipment and Method: Stylet (roll of gauze inbetween teet) Placement Confirmation: ETT inserted through vocal cords under direct vision, positive ETCO2 and breath sounds checked- equal and bilateral Secured at: 23 cm Tube secured with: Tape Dental Injury: Teeth and Oropharynx as per pre-operative assessment

## 2021-12-04 NOTE — H&P (Signed)
Chief Complaint: Patient was seen in consultation today for left renal mass  Referring Physician(s): Dr. Lovena Neighbours  Supervising Physician: Aletta Edouard  Patient Status: Carl Gutierrez  History of Present Illness: Carl Gutierrez is a 76 y.o. male with a medical history significant for CAD s/p stent placement (2008), DM, HTN, BPH s/p prostatectomy, panhypopituitarism s/p pituitary adenoma removal (2012) and clotting disorder with recurrent DVT. He was evaluated by cardiology earlier this year for chest pain and during this work up there was an incidental finding of an exophytic left renal mass.   MR Abdomen 07/31/21 IMPRESSION: 1. 1.6 cm enhancing mass at the posterior left kidney, should be considered renal cell carcinoma until proven otherwise. 2. No lymphadenopathy. 3. Bilateral renal cysts. 4. Colonic diverticulosis.  The patient was referred to Interventional Radiology and he met with Dr. Kathlene Cote 11/04/21 to discuss possible management/treatment options. The patient was accompanied by his wife for that visit. The mass is amenable in size and location to percutaneous cryoablation. Details, risks and benefits of percutaneous renal cryoablation were discussed with the patient and his wife and there was agreement to proceed. A biopsy of the mass will also be performed during this procedure. The patient was made aware the procedure would be done under general anesthesia with planned overnight observation.   Past Medical History:  Diagnosis Date   CAD S/P percutaneous coronary angioplasty February 2004; 2008   PCI-dLAD: 2.25 mm x 16 mm Express BMS; Ramus- 2.75 mm x 18 mm Cypher DES (2.8 mm); follow-up 11/'08: Patent LAD/ramus stents. 90% small OM. EF 50-60%.; Myoview 1/'12: Exercise 9 min, 10 METS with no ischemia/infarction.    Clotting disorder (HCC)    Diabetes mellitus    Low-dose metformin   DJD (degenerative joint disease), lumbosacral     status post L4-L5 surgery   Essential  hypertension    History of DVT of lower extremity     postoperative   Hyperlipidemia with target LDL less than 70    Hypothyroidism    Synthroid   Panhypopituitarism ([place])    Status post pituitary adenoma removal in 2012   Recurrent deep vein thrombosis (DVT) ([facility]) 10/01/2015    Past Surgical History:  Procedure Laterality Date   ARTHROSCOPY KNEE W/ DRILLING Left    BRAIN SURGERY  06/16/2010   Removal of pituitary adenoma   CARDIAC CATHETERIZATION  04/17/2007   Significant LAD tapering but no significant disease the distal stent. Patent Ramus stent. 90% lesion in small OM 2; small nondominant RCA. Medical therapy. EF 50-60%.   CORONARY ANGIOPLASTY WITH STENT PLACEMENT  07/17/2002   PCI-dLAD: 2.25 mm x 16 mm BMS;;PCI- RI: 2.75 mm 18 mm Cypher DES   IR RADIOLOGIST EVAL & MGMT  11/04/2021   NM MYOVIEW LTD  06/16/2010   a) Exercise ~9 min - 10 METS; no ischemia or infarction; b) 06/2021: LOW RISK.  Normal LV size and function.  EF 55 to 60%.  No RWMA.  No ischemia or infarct.  Fixed inferior basal apical defect with normal motion suggestive of artifact.  (Diaphragmatic attenuation)   PROSTATECTOMY N/A 08/13/2015   Procedure: TRANSVESICLE OPEN PROSTATECTOMY** ;  Surgeon: Carolan Clines, MD;  Location: [facility];  Service: Urology;  Laterality: N/A;   SPINE SURGERY     L4-L5    Allergies: Hydrochlorothiazide and Other  Medications: Prior to Admission medications   Medication Sig Start Date End Date Taking? Authorizing Provider  amLODipine (NORVASC) 10 MG tablet TAKE 1 TABLET BY MOUTH EVERY DAY  11/29/21  Yes Wendie Agreste, MD  hydrocortisone (CORTEF) 20 MG tablet Take 10-20 mg by mouth See admin instructions. Take 20 mg by mouth in the morning and 10 mg in the evening   Yes [provider]  Insulin Lispro Prot & Lispro (HUMALOG 75/25 MIX) (75-25) 100 UNIT/ML Kwikpen INJECT 20 UNITS UNDER THE SKIN BEFORE BREAKFAST AND 6 UNITS BEFORE SUPPER. (REPLACES 70/30) Patient taking  differently: Inject 18-26 Units into the skin See admin instructions. INJECT 26 UNITS UNDER THE SKIN BEFORE BREAKFAST AND 8 UNITS BEFORE SUPPER. (REPLACES 70/30) 10/07/21  Yes Elayne Snare, MD  levothyroxine (SYNTHROID) 88 MCG tablet Take 1 tablet (88 mcg total) by mouth daily. 06/19/21  Yes Wendie Agreste, MD  metoprolol succinate (TOPROL-XL) 25 MG 24 hr tablet TAKE 1 TABLET (25 MG TOTAL) BY MOUTH DAILY. 11/29/21  Yes Wendie Agreste, MD  nitroGLYCERIN (NITROSTAT) 0.4 MG SL tablet Place 1 tablet (0.4 mg total) under the tongue every 5 (five) minutes as needed for chest pain. 07/01/21 11/21/21 Yes Martinique, Peter M, MD  rosuvastatin (CRESTOR) 40 MG tablet TAKE 1 TABLET BY MOUTH EVERY DAY 11/29/21  Yes Wendie Agreste, MD  XIGDUO XR 10-998 MG TB24 TAKE 1 TABLET BY MOUTH EVERY DAY 11/29/21  Yes Elayne Snare, MD  Alcohol Swabs (PHARMACIST CHOICE ALCOHOL) PADS SMARTSIG:1 Each Topical 4 Times Daily 07/17/21   [provider]  aspirin 81 MG tablet Take 81 mg by mouth daily. Reported on 09/05/2015 Patient not taking: Reported on 11/21/2021    [provider]  B-D UF III MINI PEN NEEDLES 31G X 5 MM MISC USE FOR INSULIN PEN TWICE A DAY 04/29/21   Elayne Snare, MD  cyclobenzaprine (FLEXERIL) 5 MG tablet Take 1 tablet (5 mg total) by mouth 3 (three) times daily as needed for muscle spasms (start qhs prn due to sedation). Patient not taking: Reported on 08/07/2021 07/15/21   Wendie Agreste, MD  gabapentin (NEURONTIN) 100 MG capsule Take 1 capsule (100 mg total) by mouth 3 (three) times daily. Start QD for first few days, then twice per day. If needed after another week can increase to 3 times per day. Patient not taking: Reported on 09/26/2021 07/29/21   Wendie Agreste, MD  [facility] VERIO test strip USE AS INSTRUCTED TO CHECK BLOOD SUGAR ONCE DAILY. 11/23/20   Elayne Snare, MD  predniSONE (DELTASONE) 20 MG tablet 3 by mouth for 3 days, then 2 by mouth for 2 days, then 1 by mouth for 2 days, then 1/2 by  mouth for 2 days. Patient not taking: Reported on 11/21/2021 07/29/21   Wendie Agreste, MD  testosterone cypionate (DEPO-TESTOSTERONE) 200 MG/ML injection Inject 1.5 mLs (300 mg total) into the muscle every 21 ( twenty-one) days. 11/23/14 03/14/15  Elayne Snare, MD     Family History  Problem Relation Age of Onset   Cancer Mother        pancreatic ca    Social History   Socioeconomic History   Marital status: Married    Spouse name: Not on file   Number of children: Not on file   Years of education: Not on file   Highest education level: Not on file  Occupational History   Not on file  Tobacco Use   Smoking status: Former    Types: Cigarettes    Quit date: 06/24/1993    Years since quitting: 28.4   Smokeless tobacco: Never  Vaping Use   Vaping Use: Never used  Substance and Sexual Activity   Alcohol use: No   Drug use: No   Sexual activity: Not on file    Comment: Local truck driver, 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 Exercises only occasionally. Not as much as he desires 2. 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   He does not smoke cigarettes -- quit in 1995. Does not drink alcohol.   Social Determinants of Health   Financial Resource Strain: Low Risk  (09/26/2021)   Overall Financial Resource Strain (CARDIA)    Difficulty of Paying Living Expenses: Not hard at all  Food Insecurity: No Food Insecurity (09/26/2021)   Hunger Vital Sign    Worried About Running Out of Food in the Last Year: Never true    Ran Out of Food in the Last Year: Never true  Transportation Needs: No Transportation Needs (09/26/2021)   PRAPARE - Hydrologist (Medical): No    Lack of Transportation (Non-Medical): No  Physical Activity: Inactive (09/26/2021)   Exercise Vital Sign    Days of Exercise per Week: 0 days    Minutes of Exercise per Session: 0 min  Stress:  No Stress Concern Present (09/26/2021)   [place]    Feeling of Stress : Not at all  Social Connections: Moderately Integrated (09/26/2021)   Social Connection and Isolation Panel [NHANES]    Frequency of Communication with Friends and Family: Twice a week    Frequency of Social Gatherings with Friends and Family: Twice a week    Attends Religious Services: More than 4 times per year    Active Member of [employer] or Organizations: No    Attends Archivist Meetings: Never    Marital Status: Married    Review of Systems: A 12 point ROS discussed and pertinent positives are indicated in the HPI above.  All other systems are negative.  Review of Systems  Constitutional:  Negative for appetite change and fatigue.  Respiratory:  Negative for cough and shortness of breath.   Cardiovascular:  Negative for chest pain and leg swelling.  Gastrointestinal:  Negative for abdominal pain, diarrhea, nausea and vomiting.  Genitourinary:  Negative for flank pain.  Neurological:  Negative for dizziness and headaches.    Vital Signs: BP (!) 152/76   Pulse 66   Temp 97.7 F (36.5 C) (Oral)   Resp 18   Ht 5' 11"  (1.803 m)   Wt 222 lb (100.7 kg)   SpO2 100%   BMI 30.96 kg/m   Physical Exam Constitutional:      General: He is not in acute distress.    Appearance: He is not ill-appearing.  HENT:     Mouth/Throat:     Mouth: Mucous membranes are moist.     Pharynx: Oropharynx is clear.  Cardiovascular:     Rate and Rhythm: Normal rate and regular rhythm.     Pulses: Normal pulses.     Heart sounds: Normal heart sounds.  Pulmonary:     Effort: Pulmonary effort is normal.     Breath sounds: Normal breath sounds.  Abdominal:     General: Bowel sounds are normal.     Palpations: Abdomen is soft.     Tenderness: There is no abdominal tenderness.  Musculoskeletal:     Right lower  leg: No edema.     Left lower leg: No  edema.  Skin:    General: Skin is warm.  Neurological:     Mental Status: He is alert and oriented to person, place, and time.     Imaging: IR Radiologist Eval & Mgmt  Result Date: 11/04/2021 Please refer to notes tab for details about interventional procedure. (Op Note)   Labs:  CBC: Recent Labs    07/08/21 1223 07/15/21 1532 11/28/21 1135  WBC 11.2* 11.4* 9.3  HGB 13.2 13.2 13.1  HCT 42.2 40.3 41.0  PLT 362 357.0 371    COAGS: Recent Labs    11/28/21 1135  INR 1.1    BMP: Recent Labs    07/08/21 1223 08/20/21 0808 10/24/21 0849 11/28/21 1135  NA 137 140 138 141  K 4.2 4.4 4.1 4.4  CL 110 107 107 111  CO2 21* 24 22 22   GLUCOSE 159* 118* 118* 183*  BUN 23 27* 25* 24*  CALCIUM 8.7* 9.5 9.3 9.3  CREATININE 1.56* 1.60* 1.68* 1.80*  GFRNONAA 45*  --   --  38*    LIVER FUNCTION TESTS: Recent Labs    12/05/20 0847 06/19/21 0842  BILITOT 0.3 0.3  AST 13 13  ALT 15 15  ALKPHOS 71 75  PROT 7.4 7.1  ALBUMIN 4.4 4.1    TUMOR MARKERS: No results for input(s): "AFPTM", "CEA", "CA199", "CHROMGRNA" in the last 8760 hours.  Assessment and Plan:  Left renal mass: Carl Gutierrez, 76 year old male, presents today to the [facility] department for an image-guided percutaneous cryoablation with biopsy of a left renal mass.  Risks and benefits of image guided renal cryoablation was discussed with the patient including, but not limited to, failure to treat entire lesion, bleeding, infection, damage to adjacent structures, hematuria, urine leak, decrease in renal function or post procedural neuropathy.  All of the patient's questions were answered and the patient is agreeable to proceed. He has been NPO. Labs and vitals have been reviewed. His last dose of aspirin was 5 days ago.   Consent signed and in chart.  Thank you for this interesting consult.  I greatly enjoyed meeting Carl Gutierrez and look forward to participating in their care.  A  copy of this report was sent to the requesting provider on this date.  Electronically Signed: Soyla Dryer, AGACNP-BC [phone] 12/04/2021, 11:09 AM   I spent a total of  30 Minutes   in face to face in clinical consultation, greater than 50% of which was counseling/coordinating care for renal cryoablation

## 2021-12-05 ENCOUNTER — Encounter (HOSPITAL_COMMUNITY): Payer: Self-pay | Admitting: Interventional Radiology

## 2021-12-05 DIAGNOSIS — C642 Malignant neoplasm of left kidney, except renal pelvis: Secondary | ICD-10-CM | POA: Diagnosis not present

## 2021-12-05 LAB — SURGICAL PATHOLOGY

## 2021-12-05 LAB — GLUCOSE, CAPILLARY
Glucose-Capillary: 199 mg/dL — ABNORMAL HIGH (ref 70–99)
Glucose-Capillary: 148 mg/dL — ABNORMAL HIGH (ref 70–99)

## 2021-12-05 MED ORDER — HYDROCODONE-ACETAMINOPHEN 5-325 MG PO TABS: 1.0000 | ORAL_TABLET | ORAL | 0 refills | Status: DC | PRN

## 2021-12-05 MED ORDER — OXYCODONE HCL 5 MG PO TABS: 5.0000 mg | ORAL_TABLET | ORAL | 0 refills | Status: DC | PRN

## 2021-12-05 NOTE — Discharge Summary (Signed)
Patient ID: Carl Gutierrez MRN: [id number] DOB/AGE: [birth year] 76 y.o.  Admit date: 12/04/2021 Discharge date: 12/05/2021  Supervising Physician: Irish Lack  Patient Status: [facility] - In-pt  Admission Diagnoses: Left renal mass  Discharge Diagnoses:  Principal Problem:   Left renal mass   Discharged Condition: good  Hospital Course: Patient presented today for an elective procedure to treat a left renal lesion. He underwent percutaneous cryoablation with biopsy under general anesthesia. He scheduled to remain at the hospital for overnight observation. The patient fulfilled his bed rest requirements, he has ambulated, eaten and voided. He denies any pain or discomfort. His family members are supportive of the patient going home. He will follow up with Dr. Fredia Sorrow in 2-3 weeks for a post-procedure visit. A prescription for Norco has been e-prescribed to the patient's pharmacy. He was encouraged to take  Norco if he has severe pain. The patient and his family know to call IR if the patient develops any blood in his urine, difficulty urinating or flank pain unresponsive to pain medication.  Consults: Anesthesia  Significant Diagnostic Studies:  See imaging  Treatments: See procedure note  Discharge Exam: Blood pressure (!) 131/58, pulse 69, temperature 98.2 F (36.8 C), temperature source Oral, resp. rate 20, height 5\' 11"  (1.803 m), weight 222 lb (100.7 kg), SpO2 99 %.  Physical Exam Vitals reviewed.  Constitutional:      General: He is not in acute distress.    Appearance: Normal appearance. He is not ill-appearing.  HENT:     Head: Normocephalic and atraumatic.  Eyes:     Extraocular Movements: Extraocular movements intact.     Pupils: Pupils are equal, round, and reactive to light.  Cardiovascular:     Rate and Rhythm: Normal rate and regular rhythm.     Pulses: Normal pulses.     Heart sounds: Normal heart sounds.  Pulmonary:     Effort: Pulmonary effort is normal.  No respiratory distress.     Breath sounds: Normal breath sounds.  Skin:    General: Skin is warm and dry.     Comments: Puncture site is clean, dry and non-tender. No bleeding, redness, oozing or swelling. Pt denies pain.   Neurological:     Mental Status: He is alert and oriented to person, place, and time.  Psychiatric:        Mood and Affect: Mood normal.        Behavior: Behavior normal.        Thought Content: Thought content normal.        Judgment: Judgment normal.       Disposition: Home   Allergies as of 12/05/2021       Reactions   Hydrochlorothiazide    Leg and side pain with this   Other    Blood pressure med name unknown.        Medication List     TAKE these medications    amLODipine 10 MG tablet Commonly known as: NORVASC TAKE 1 TABLET BY MOUTH EVERY DAY   aspirin 81 MG tablet Take 81 mg by mouth daily. Reported on 09/05/2015   B-D UF III MINI PEN NEEDLES 31G X 5 MM Misc Generic drug: Insulin Pen Needle USE FOR INSULIN PEN TWICE A DAY   cyclobenzaprine 5 MG tablet Commonly known as: FLEXERIL Take 1 tablet (5 mg total) by mouth 3 (three) times daily as needed for muscle spasms (start qhs prn due to sedation).   gabapentin 100 MG capsule Commonly  known as: NEURONTIN Take 1 capsule (100 mg total) by mouth 3 (three) times daily. Start QD for first few days, then twice per day. If needed after another week can increase to 3 times per day.   HYDROcodone-acetaminophen 5-325 MG tablet Commonly known as: NORCO/VICODIN Take 1-2 tablets by mouth every 4 (four) hours as needed for moderate pain.   hydrocortisone 20 MG tablet Commonly known as: CORTEF Take 10-20 mg by mouth See admin instructions. Take 20 mg by mouth in the morning and 10 mg in the evening   Insulin Lispro Prot & Lispro (75-25) 100 UNIT/ML Kwikpen Commonly known as: HUMALOG 75/25 MIX INJECT 20 UNITS UNDER THE SKIN BEFORE BREAKFAST AND 6 UNITS BEFORE SUPPER. (REPLACES 70/30) What  changed:  how much to take how to take this when to take this additional instructions   levothyroxine 88 MCG tablet Commonly known as: SYNTHROID Take 1 tablet (88 mcg total) by mouth daily.   metoprolol succinate 25 MG 24 hr tablet Commonly known as: TOPROL-XL TAKE 1 TABLET (25 MG TOTAL) BY MOUTH DAILY.   nitroGLYCERIN 0.4 MG SL tablet Commonly known as: NITROSTAT Place 1 tablet (0.4 mg total) under the tongue every 5 (five) minutes as needed for chest pain.   OneTouch Verio test strip Generic drug: glucose blood USE AS INSTRUCTED TO CHECK BLOOD SUGAR ONCE DAILY.   Pharmacist Choice Alcohol Pads SMARTSIG:1 Each Topical 4 Times Daily   predniSONE 20 MG tablet Commonly known as: DELTASONE 3 by mouth for 3 days, then 2 by mouth for 2 days, then 1 by mouth for 2 days, then 1/2 by mouth for 2 days.   rosuvastatin 40 MG tablet Commonly known as: CRESTOR TAKE 1 TABLET BY MOUTH EVERY DAY   Xigduo XR 10-998 MG Tb24 Generic drug: Dapagliflozin-metFORMIN HCl ER TAKE 1 TABLET BY MOUTH EVERY DAY          Electronically Signed: Shon Hough, NP 12/05/2021, 2:09 PM   I have spent Less Than 30 Minutes discharging [employer].

## 2021-12-05 NOTE — Progress Notes (Signed)
Pt being d/c.  PIVs removed Discharge information given.  Pt verb understanding and had no further questions Tx via wheelchair

## 2021-12-05 NOTE — Care Management Obs Status (Signed)
[place] NOTIFICATION   Patient Details  Name: Carl Gutierrez MRN: [id number] Date of Birth: Mar 09, 1946   Medicare Observation Status Notification Given:  Yes    Purcell Mouton, RN 12/05/2021, 1:04 PM

## 2021-12-13 ENCOUNTER — Emergency Department
Admission: EM | Admit: 2021-12-13 | Discharge: 2021-12-13 | Disposition: A | Discharge status: Home / Self Care | Payer: Medicare Other | Source: Home / Self Care

## 2021-12-13 ENCOUNTER — Encounter: Payer: Self-pay | Admitting: Emergency Medicine

## 2021-12-13 DIAGNOSIS — R81 Glycosuria: Secondary | ICD-10-CM | POA: Diagnosis not present

## 2021-12-13 DIAGNOSIS — N3001 Acute cystitis with hematuria: Secondary | ICD-10-CM

## 2021-12-13 DIAGNOSIS — R350 Frequency of micturition: Secondary | ICD-10-CM | POA: Diagnosis not present

## 2021-12-13 LAB — POCT URINALYSIS DIP (MANUAL ENTRY)
Bilirubin, UA: NEGATIVE
Glucose, UA: >=1000 mg/dL — AB
Ketones, POC UA: NEGATIVE mg/dL
Nitrite, UA: POSITIVE — AB
Protein Ur, POC: >=300 mg/dL — AB
Spec Grav, UA: 1.025 (ref 1.010–1.025)
Urobilinogen, UA: 1 E.U./dL
pH, UA: 5.5 (ref 5.0–8.0)

## 2021-12-13 MED ORDER — CIPROFLOXACIN HCL 500 MG PO TABS: 500.0000 mg | ORAL_TABLET | Freq: Two times a day (BID) | ORAL | 0 refills | Status: DC

## 2021-12-13 NOTE — ED Triage Notes (Signed)
Pt c/o abdominal pain and urinary frequency since last night. States he had kidney surgery last week at [facility]. Has not taken any meds for this.

## 2021-12-13 NOTE — ED Provider Notes (Signed)
Carl Gutierrez CARE    CSN: [id number] Arrival date & time: 12/13/21  1323      History   Chief Complaint Chief Complaint  Patient presents with   Urinary Frequency    HPI Carl Gutierrez is a 76 y.o. male.   HPI 76 year old male presents with lower abdominal pain and urinary frequency since last night.  Reports he had kidney surgery last week at [facility] has not take any medications for this.  Patient admitted to [facility] long hospital on 12/04/2021 for left renal mass.  Reports surgeon advised him to come to [facility] health urgent care to be checked for urinary tract infection.  PMH significant for left renal mass, CKD stage III, hematuria, and T2DM.  Patient is accompanied by his wife this afternoon.  Past Medical History:  Diagnosis Date   CAD S/P percutaneous coronary angioplasty February 2004; 2008   PCI-dLAD: 2.25 mm x 16 mm Express BMS; Ramus- 2.75 mm x 18 mm Cypher DES (2.8 mm); follow-up 11/'08: Patent LAD/ramus stents. 90% small OM. EF 50-60%.; Myoview 1/'12: Exercise 9 min, 10 METS with no ischemia/infarction.    Clotting disorder (HCC)    Diabetes mellitus    Low-dose metformin   DJD (degenerative joint disease), lumbosacral     status post L4-L5 surgery   Essential hypertension    History of DVT of lower extremity     postoperative   Hyperlipidemia with target LDL less than 70    Hypothyroidism    Synthroid   Panhypopituitarism ([place])    Status post pituitary adenoma removal in 2012   Recurrent deep vein thrombosis (DVT) ([place]) 10/01/2015    Patient Active Problem List   Diagnosis Date Noted   Left renal mass 12/04/2021   Chest pain with low risk for cardiac etiology 09/08/2021   Gout of big toe 10/31/2020   Stage 3a chronic kidney disease ([place]) 01/04/2020   Anemia in chronic kidney disease 11/30/2015   Recurrent deep vein thrombosis (DVT) ([place]) 10/01/2015   Hematuria, unspecified 10/01/2015   Elevated serum creatinine 10/01/2015   BPH (benign prostatic  hyperplasia) 08/13/2015   Benign localized hyperplasia of prostate with urinary obstruction 07/23/2015   Preop cardiovascular exam 07/17/2015   Encounter for CDL (commercial driving license) exam 38/45/3646   Obesity (BMI 30-39.9) 06/26/2013   Essential hypertension 06/26/2013   Panhypopituitarism ([place]) 11/17/2011     11/17/2011   Diabetes mellitus ([place]) 11/17/2011   Hyperlipidemia associated with type 2 diabetes mellitus ([place]) 11/17/2011   CAD S/P percutaneous coronary angioplasty 07/19/2002    Past Surgical History:  Procedure Laterality Date   ARTHROSCOPY KNEE W/ DRILLING Left    BRAIN SURGERY  06/16/2010   Removal of pituitary adenoma   CARDIAC CATHETERIZATION  04/17/2007   Significant LAD tapering but no significant disease the distal stent. Patent Ramus stent. 90% lesion in small OM 2; small nondominant RCA. Medical therapy. EF 50-60%.   CORONARY ANGIOPLASTY WITH STENT PLACEMENT  07/17/2002   PCI-dLAD: 2.25 mm x 16 mm BMS;;PCI- RI: 2.75 mm 18 mm Cypher DES   IR RADIOLOGIST EVAL & MGMT  11/04/2021   NM MYOVIEW LTD  06/16/2010   a) Exercise ~9 min - 10 METS; no ischemia or infarction; b) 06/2021: LOW RISK.  Normal LV size and function.  EF 55 to 60%.  No RWMA.  No ischemia or infarct.  Fixed inferior basal apical defect with normal motion suggestive of artifact.  (Diaphragmatic attenuation)   PROSTATECTOMY N/A 08/13/2015   Procedure: TRANSVESICLE OPEN  PROSTATECTOMY** ;  Surgeon: Carolan Clines, MD;  Location: [facility];  Service: Urology;  Laterality: N/A;   RADIOLOGY WITH ANESTHESIA N/A 12/04/2021   Procedure: LEFT RENAL CRYO ABLATION;  Surgeon: Aletta Edouard, MD;  Location: [facility];  Service: Radiology;  Laterality: N/A;   SPINE SURGERY     L4-L5       Home Medications    Prior to Admission medications   Medication Sig Start Date End Date Taking? Authorizing Provider  ciprofloxacin (CIPRO) 500 MG tablet Take 1 tablet (500 mg total) by mouth 2 (two) times daily. 12/13/21   Yes Eliezer Lofts, FNP  Alcohol Swabs (PHARMACIST CHOICE ALCOHOL) PADS SMARTSIG:1 Each Topical 4 Times Daily 07/17/21   [provider]  amLODipine (NORVASC) 10 MG tablet TAKE 1 TABLET BY MOUTH EVERY DAY 11/29/21   Wendie Agreste, MD  aspirin 81 MG tablet Take 81 mg by mouth daily. Reported on 09/05/2015 Patient not taking: Reported on 11/21/2021    [provider]  B-D UF III MINI PEN NEEDLES 31G X 5 MM MISC USE FOR INSULIN PEN TWICE A DAY 04/29/21   Elayne Snare, MD  cyclobenzaprine (FLEXERIL) 5 MG tablet Take 1 tablet (5 mg total) by mouth 3 (three) times daily as needed for muscle spasms (start qhs prn due to sedation). Patient not taking: Reported on 08/07/2021 07/15/21   Wendie Agreste, MD  gabapentin (NEURONTIN) 100 MG capsule Take 1 capsule (100 mg total) by mouth 3 (three) times daily. Start QD for first few days, then twice per day. If needed after another week can increase to 3 times per day. Patient not taking: Reported on 09/26/2021 07/29/21   Wendie Agreste, MD  hydrocortisone (CORTEF) 20 MG tablet Take 10-20 mg by mouth See admin instructions. Take 20 mg by mouth in the morning and 10 mg in the evening    [provider]  Insulin Lispro Prot & Lispro (HUMALOG 75/25 MIX) (75-25) 100 UNIT/ML Kwikpen INJECT 20 UNITS UNDER THE SKIN BEFORE BREAKFAST AND 6 UNITS BEFORE SUPPER. (REPLACES 70/30) Patient taking differently: Inject 18-26 Units into the skin See admin instructions. INJECT 26 UNITS UNDER THE SKIN BEFORE BREAKFAST AND 8 UNITS BEFORE SUPPER. (REPLACES 70/30) 10/07/21   Elayne Snare, MD  levothyroxine (SYNTHROID) 88 MCG tablet Take 1 tablet (88 mcg total) by mouth daily. 06/19/21   Wendie Agreste, MD  metoprolol succinate (TOPROL-XL) 25 MG 24 hr tablet TAKE 1 TABLET (25 MG TOTAL) BY MOUTH DAILY. 11/29/21   Wendie Agreste, MD  nitroGLYCERIN (NITROSTAT) 0.4 MG SL tablet Place 1 tablet (0.4 mg total) under the tongue every 5 (five) minutes as needed for chest  pain. 07/01/21 11/21/21  Martinique, Peter M, MD  [facility] VERIO test strip USE AS INSTRUCTED TO CHECK BLOOD SUGAR ONCE DAILY. 11/23/20   Elayne Snare, MD  oxyCODONE (ROXICODONE) 5 MG immediate release tablet Take 1 tablet (5 mg total) by mouth every 4 (four) hours as needed for severe pain. 12/05/21   Tyson Alias, NP  rosuvastatin (CRESTOR) 40 MG tablet TAKE 1 TABLET BY MOUTH EVERY DAY 11/29/21   Wendie Agreste, MD  XIGDUO XR 10-998 MG TB24 TAKE 1 TABLET BY MOUTH EVERY DAY 11/29/21   Elayne Snare, MD  testosterone cypionate (DEPO-TESTOSTERONE) 200 MG/ML injection Inject 1.5 mLs (300 mg total) into the muscle every 21 ( twenty-one) days. 11/23/14 03/14/15  Elayne Snare, MD    Family History Family History  Problem Relation Age of Onset   Cancer  Mother        pancreatic ca    Social History Social History   Tobacco Use   Smoking status: Former    Types: Cigarettes    Quit date: 06/24/1993    Years since quitting: 28.4   Smokeless tobacco: Never  Vaping Use   Vaping Use: Never used  Substance Use Topics   Alcohol use: No   Drug use: No     Allergies   Hydrochlorothiazide and Other   Review of Systems Review of Systems  Genitourinary:  Positive for frequency.  All other systems reviewed and are negative.    Physical Exam Triage Vital Signs ED Triage Vitals  Enc Vitals Group     BP 12/13/21 1414 105/73     Gutierrez Rate 12/13/21 1414 85     Resp --      Temp 12/13/21 1414 99.8 F (37.7 C)     Temp src --      SpO2 12/13/21 1414 96 %     Weight --      Height --      Head Circumference --      Peak Flow --      Pain Score 12/13/21 1416 4     Pain Loc --      Pain Edu? --      Excl. in [place]? --    No data found.  Updated Vital Signs BP 105/73 (BP Location: Right Arm)   Gutierrez 85   Temp 99.8 F (37.7 C)   SpO2 96%      Physical Exam Vitals and nursing note reviewed.  Constitutional:      Appearance: Normal appearance. He is normal weight.  HENT:     Head:  Normocephalic and atraumatic.     Mouth/Throat:     Mouth: Mucous membranes are moist.     Pharynx: Oropharynx is clear.  Eyes:     Extraocular Movements: Extraocular movements intact.     Conjunctiva/sclera: Conjunctivae normal.     Pupils: Pupils are equal, round, and reactive to light.  Cardiovascular:     Rate and Rhythm: Normal rate and regular rhythm.     Pulses: Normal pulses.     Heart sounds: Normal heart sounds.  Pulmonary:     Effort: Pulmonary effort is normal.     Breath sounds: Normal breath sounds. No wheezing, rhonchi or rales.  Abdominal:     Tenderness: There is right CVA tenderness and left CVA tenderness.  Musculoskeletal:     Cervical back: Normal range of motion and neck supple.  Skin:    General: Skin is warm and dry.  Neurological:     General: No focal deficit present.     Mental Status: He is alert and oriented to person, place, and time. Mental status is at baseline.      UC Treatments / Results  Labs (all labs ordered are listed, but only abnormal results are displayed) Labs Reviewed  POCT URINALYSIS DIP (MANUAL ENTRY) - Abnormal; Notable for the following components:      Result Value   Clarity, UA cloudy (*)    Glucose, UA >=1,000 (*)    Blood, UA large (*)    Protein Ur, POC >=300 (*)    Nitrite, UA Positive (*)    Leukocytes, UA Trace (*)    All other components within normal limits  URINE CULTURE    EKG   Radiology No results found.  Procedures Procedures (including critical care time)  Medications Ordered in UC Medications - No data to display  Initial Impression / Assessment and Plan / UC Course  I have reviewed the triage vital signs and the nursing notes.  Pertinent labs & imaging results that were available during my care of the patient were reviewed by me and considered in my medical decision making (see chart for details).     MDM: 1.  Acute cystitis with hematuria-Rx'd Cipro, urine culture ordered; 2.   Glucosuria-UA revealed above. Advised patient to take medication as directed with food to completion.  Encourage patient to increase daily water intake while taking these medications.  Instructed patient if symptoms worsen and/or unresolved please follow-up with PCP/nephrology for further evaluation.  Patient discharged home, hemodynamically stable. Final Clinical Impressions(s) / UC Diagnoses   Final diagnoses:  Urinary frequency  Acute cystitis with hematuria  Glucosuria     Discharge Instructions      Advised patient to take medication as directed with food to completion.  Encourage patient to increase daily water intake while taking these medications.  Instructed patient if symptoms worsen and/or unresolved please follow-up with PCP/nephrology for further evaluation.     ED Prescriptions     Medication Sig Dispense Auth. Provider   ciprofloxacin (CIPRO) 500 MG tablet Take 1 tablet (500 mg total) by mouth 2 (two) times daily. 14 tablet Eliezer Lofts, FNP      PDMP not reviewed this encounter.   Eliezer Lofts, [place] 12/13/21 1549

## 2021-12-13 NOTE — Discharge Instructions (Addendum)
Advised patient to take medication as directed with food to completion.  Encourage patient to increase daily water intake while taking these medications.  Instructed patient if symptoms worsen and/or unresolved please follow-up with PCP/nephrology for further evaluation.

## 2021-12-16 LAB — URINE CULTURE
MICRO NUMBER:: 13595841
SPECIMEN QUALITY:: ADEQUATE

## 2021-12-22 NOTE — Progress Notes (Unsigned)
Subjective:  Patient ID: Carl Gutierrez, male    DOB: 05-27-1946  Age: 76 y.o. MRN: [id number]  CC: No chief complaint on file.   HPI [name] presents for   Urologic Seen in ER June 30 with abdominal pain, urinary frequency of 2 days.  Diagnosed with acute cystitis with hematuria, treated with Cipro.  Urine culture E. coli, sensitive to Cipro. Followed by Dr. Lovena Neighbours with urology for incidental exophytic left renal mass noted earlier this year.  1.6 cm posterior left kidney on MR abdomen 07/31/2021.  Underwent percutaneous cryoablation with biopsy under general anesthesia on June 21st.  Pathology indicated small focus of clear-cell renal cell carcinoma, nuclear grade 2.  Cardiac History of CAD, hypertension, hyperlipidemia, followed by cardiology with multiple visits and evaluation earlier this year for chest pain,.  To be more musculoskeletal/chest wall pain.  Myocardial perfusion with no ischemia and normal EF in January, cardiology note from February noted.  Atypical sounding chest pain.  Did not really notice any benefit from sublingual nitro.  Continue aspirin and statin, low-dose Toprol and max dose amlodipine.  Option of invasive ischemic evaluation with cardiac cath if pain not improving but otherwise plan to treat as musculoskeletal pain.  Denies chest pain on his June 1 preoperative eval with cardiology. Gabapentin prescribed for possible pinched nerve in neck based on symptoms earlier this year.  Lab Results  Component Value Date   CHOL 121 06/19/2021   HDL 33.90 (L) 06/19/2021   LDLCALC 64 06/19/2021   LDLDIRECT 85.0 05/18/2014   TRIG 118.0 06/19/2021   CHOLHDL 4 06/19/2021  Crestor 40 mg daily for hyperlipidemia.  BP Readings from Last 3 Encounters:  12/13/21 105/73  12/05/21 (!) 131/58  11/28/21 134/75     Diabetes: Followed by endocrinology, Dr. Dwyane Dee.  Last visit May 11 noted.  Fructosamine normal at 253.  Treated with premix insulin twice daily (75/25) and  Xigduo.  He is on statin.  Renal function has been trending upwards, lisinopril appears to be discontinued at his May visit with endocrinology  Optho, foot exam, pneumovax:  Ophthalmology:  Lab Results  Component Value Date   HGBA1C 6.9 (H) 11/28/2021   HGBA1C 8.1 (H) 08/20/2021   HGBA1C 7.6 (H) 04/19/2021   Lab Results  Component Value Date   MICROALBUR 4.5 (H) 04/19/2021   LDLCALC 64 06/19/2021   CREATININE 1.80 (H) 11/28/2021    CKD stage IIIa prior Most recent creatinine 1.80 on June 15.  EGFR 38.  Worsened from previous creatinine 1.68 on May 11.  Previous range 1.38-1.49 approximately 6 months ago.  Lisinopril recently discontinued as above by endocrinology.  Hypothyroidism: Lab Results  Component Value Date   TSH 4.57 04/19/2021  Taking medication daily.  Synthroid 88 mcg. No new hot or cold intolerance. No new hair or skin changes, heart palpitations or new fatigue. No new weight changes.       History Patient Active Problem List   Diagnosis Date Noted   Left renal mass 12/04/2021   Chest pain with low risk for cardiac etiology 09/08/2021   Gout of big toe 10/31/2020   Stage 3a chronic kidney disease ([place]) 01/04/2020   Anemia in chronic kidney disease 11/30/2015   Recurrent deep vein thrombosis (DVT) ([place]) 10/01/2015   Hematuria, unspecified 10/01/2015   Elevated serum creatinine 10/01/2015   BPH (benign prostatic hyperplasia) 08/13/2015   Benign localized hyperplasia of prostate with urinary obstruction 07/23/2015   Preop cardiovascular exam 07/17/2015   Encounter for CDL (commercial  driving license) exam 05/39/7673   Obesity (BMI 30-39.9) 06/26/2013   Essential hypertension 06/26/2013   Panhypopituitarism ([place]) 11/17/2011     11/17/2011   Diabetes mellitus ([place]) 11/17/2011   Hyperlipidemia associated with type 2 diabetes mellitus ([place]) 11/17/2011   CAD S/P percutaneous coronary angioplasty 07/19/2002   Past Medical History:  Diagnosis Date   CAD S/P  percutaneous coronary angioplasty February 2004; 2008   PCI-dLAD: 2.25 mm x 16 mm Express BMS; Ramus- 2.75 mm x 18 mm Cypher DES (2.8 mm); follow-up 11/'08: Patent LAD/ramus stents. 90% small OM. EF 50-60%.; Myoview 1/'12: Exercise 9 min, 10 METS with no ischemia/infarction.    Clotting disorder (HCC)    Diabetes mellitus    Low-dose metformin   DJD (degenerative joint disease), lumbosacral     status post L4-L5 surgery   Essential hypertension    History of DVT of lower extremity     postoperative   Hyperlipidemia with target LDL less than 70    Hypothyroidism    Synthroid   Panhypopituitarism ([place])    Status post pituitary adenoma removal in 2012   Recurrent deep vein thrombosis (DVT) ([place]) 10/01/2015   Past Surgical History:  Procedure Laterality Date   ARTHROSCOPY KNEE W/ DRILLING Left    BRAIN SURGERY  06/16/2010   Removal of pituitary adenoma   CARDIAC CATHETERIZATION  04/17/2007   Significant LAD tapering but no significant disease the distal stent. Patent Ramus stent. 90% lesion in small OM 2; small nondominant RCA. Medical therapy. EF 50-60%.   CORONARY ANGIOPLASTY WITH STENT PLACEMENT  07/17/2002   PCI-dLAD: 2.25 mm x 16 mm BMS;;PCI- RI: 2.75 mm 18 mm Cypher DES   IR RADIOLOGIST EVAL & MGMT  11/04/2021   NM MYOVIEW LTD  06/16/2010   a) Exercise ~9 min - 10 METS; no ischemia or infarction; b) 06/2021: LOW RISK.  Normal LV size and function.  EF 55 to 60%.  No RWMA.  No ischemia or infarct.  Fixed inferior basal apical defect with normal motion suggestive of artifact.  (Diaphragmatic attenuation)   PROSTATECTOMY N/A 08/13/2015   Procedure: TRANSVESICLE OPEN PROSTATECTOMY** ;  Surgeon: Carolan Clines, MD;  Location: [facility];  Service: Urology;  Laterality: N/A;   RADIOLOGY WITH ANESTHESIA N/A 12/04/2021   Procedure: LEFT RENAL CRYO ABLATION;  Surgeon: Aletta Edouard, MD;  Location: [facility];  Service: Radiology;  Laterality: N/A;   SPINE SURGERY     L4-L5   Allergies   Allergen Reactions   Hydrochlorothiazide     Leg and side pain with this   Other     Blood pressure med name unknown.   Prior to Admission medications   Medication Sig Start Date End Date Taking? Authorizing Provider  Alcohol Swabs (PHARMACIST CHOICE ALCOHOL) PADS SMARTSIG:1 Each Topical 4 Times Daily 07/17/21   [provider]  amLODipine (NORVASC) 10 MG tablet TAKE 1 TABLET BY MOUTH EVERY DAY 11/29/21   Wendie Agreste, MD  aspirin 81 MG tablet Take 81 mg by mouth daily. Reported on 09/05/2015 Patient not taking: Reported on 11/21/2021    [provider]  B-D UF III MINI PEN NEEDLES 31G X 5 MM MISC USE FOR INSULIN PEN TWICE A DAY 04/29/21   Elayne Snare, MD  ciprofloxacin (CIPRO) 500 MG tablet Take 1 tablet (500 mg total) by mouth 2 (two) times daily. 12/13/21   Eliezer Lofts, FNP  cyclobenzaprine (FLEXERIL) 5 MG tablet Take 1 tablet (5 mg total) by mouth 3 (three) times daily as  needed for muscle spasms (start qhs prn due to sedation). Patient not taking: Reported on 08/07/2021 07/15/21   Wendie Agreste, MD  gabapentin (NEURONTIN) 100 MG capsule Take 1 capsule (100 mg total) by mouth 3 (three) times daily. Start QD for first few days, then twice per day. If needed after another week can increase to 3 times per day. Patient not taking: Reported on 09/26/2021 07/29/21   Wendie Agreste, MD  hydrocortisone (CORTEF) 20 MG tablet Take 10-20 mg by mouth See admin instructions. Take 20 mg by mouth in the morning and 10 mg in the evening    [provider]  Insulin Lispro Prot & Lispro (HUMALOG 75/25 MIX) (75-25) 100 UNIT/ML Kwikpen INJECT 20 UNITS UNDER THE SKIN BEFORE BREAKFAST AND 6 UNITS BEFORE SUPPER. (REPLACES 70/30) Patient taking differently: Inject 18-26 Units into the skin See admin instructions. INJECT 26 UNITS UNDER THE SKIN BEFORE BREAKFAST AND 8 UNITS BEFORE SUPPER. (REPLACES 70/30) 10/07/21   Elayne Snare, MD  levothyroxine (SYNTHROID) 88 MCG tablet Take 1 tablet  (88 mcg total) by mouth daily. 06/19/21   Wendie Agreste, MD  metoprolol succinate (TOPROL-XL) 25 MG 24 hr tablet TAKE 1 TABLET (25 MG TOTAL) BY MOUTH DAILY. 11/29/21   Wendie Agreste, MD  nitroGLYCERIN (NITROSTAT) 0.4 MG SL tablet Place 1 tablet (0.4 mg total) under the tongue every 5 (five) minutes as needed for chest pain. 07/01/21 11/21/21  Martinique, Peter M, MD  [facility] VERIO test strip USE AS INSTRUCTED TO CHECK BLOOD SUGAR ONCE DAILY. 11/23/20   Elayne Snare, MD  oxyCODONE (ROXICODONE) 5 MG immediate release tablet Take 1 tablet (5 mg total) by mouth every 4 (four) hours as needed for severe pain. 12/05/21   Tyson Alias, NP  rosuvastatin (CRESTOR) 40 MG tablet TAKE 1 TABLET BY MOUTH EVERY DAY 11/29/21   Wendie Agreste, MD  XIGDUO XR 10-998 MG TB24 TAKE 1 TABLET BY MOUTH EVERY DAY 11/29/21   Elayne Snare, MD  testosterone cypionate (DEPO-TESTOSTERONE) 200 MG/ML injection Inject 1.5 mLs (300 mg total) into the muscle every 21 ( twenty-one) days. 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 Exercises only occasionally. Not as much as he desires 2. 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   He does not smoke cigarettes -- quit in 1995. Does not drink alcohol.   Social Determinants of Health   Financial Resource  Strain: Low Risk  (09/26/2021)   Overall Financial Resource Strain (CARDIA)    Difficulty of Paying Living Expenses: Not hard at all  Food Insecurity: No Food Insecurity (09/26/2021)   Hunger  Vital Sign    Worried About Charity fundraiser in the Last Year: Never true    Ran Out of Food in the Last Year: Never true  Transportation Needs: No Transportation Needs (09/26/2021)   PRAPARE - Hydrologist (Medical): No    Lack of Transportation (Non-Medical): No  Physical Activity: Inactive (09/26/2021)   Exercise Vital Sign    Days of Exercise per Week: 0 days    Minutes of Exercise per Session: 0 min  Stress: No Stress Concern Present (09/26/2021)   [place]    Feeling of Stress : Not at all  Social Connections: Moderately Integrated (09/26/2021)   Social Connection and Isolation Panel [NHANES]    Frequency of Communication with Friends and Family: Twice a week    Frequency of Social Gatherings with Friends and Family: Twice a week    Attends Religious Services: More than 4 times per year    Active Member of [employer] or Organizations: No    Attends Archivist Meetings: Never    Marital Status: Married  Human resources officer Violence: Not At Risk (09/26/2021)   Humiliation, Afraid, Rape, and Kick questionnaire    Fear of Current or Ex-Partner: No    Emotionally Abused: No    Physically Abused: No    Sexually Abused: No    Review of Systems   Objective:  There were no vitals filed for this visit.   Physical Exam     Assessment & Plan:  Apostolos Blagg is a 76 y.o. male . No diagnosis found.   No orders of the defined types were placed in this encounter.  There are no Patient Instructions on file for this visit.    Signed,   [name], MD [place], [employer] 12/22/21 10:39 PM

## 2021-12-23 ENCOUNTER — Ambulatory Visit (INDEPENDENT_AMBULATORY_CARE_PROVIDER_SITE_OTHER): Payer: Medicare Other | Admitting: Family Medicine

## 2021-12-23 ENCOUNTER — Encounter: Payer: Self-pay | Admitting: Family Medicine

## 2021-12-23 VITALS — BP 128/74 | HR 64 | Temp 98.2°F | Resp 16 | Ht 71.0 in | Wt 220.4 lb

## 2021-12-23 DIAGNOSIS — I10 Essential (primary) hypertension: Secondary | ICD-10-CM | POA: Diagnosis not present

## 2021-12-23 DIAGNOSIS — N2889 Other specified disorders of kidney and ureter: Secondary | ICD-10-CM

## 2021-12-23 DIAGNOSIS — E039 Hypothyroidism, unspecified: Secondary | ICD-10-CM

## 2021-12-23 DIAGNOSIS — E1165 Type 2 diabetes mellitus with hyperglycemia: Secondary | ICD-10-CM

## 2021-12-23 DIAGNOSIS — N1831 Chronic kidney disease, stage 3a: Secondary | ICD-10-CM | POA: Diagnosis not present

## 2021-12-23 DIAGNOSIS — B9620 Unspecified Escherichia coli [E. coli] as the cause of diseases classified elsewhere: Secondary | ICD-10-CM | POA: Diagnosis not present

## 2021-12-23 DIAGNOSIS — N390 Urinary tract infection, site not specified: Secondary | ICD-10-CM

## 2021-12-23 DIAGNOSIS — E785 Hyperlipidemia, unspecified: Secondary | ICD-10-CM

## 2021-12-23 DIAGNOSIS — Z794 Long term (current) use of insulin: Secondary | ICD-10-CM

## 2021-12-23 LAB — COMPREHENSIVE METABOLIC PANEL
ALT: 14 U/L (ref 0–53)
AST: 17 U/L (ref 0–37)
Albumin: 4.3 g/dL (ref 3.5–5.2)
Alkaline Phosphatase: 80 U/L (ref 39–117)
BUN: 26 mg/dL — ABNORMAL HIGH (ref 6–23)
CO2: 26 mEq/L (ref 19–32)
Calcium: 9.6 mg/dL (ref 8.4–10.5)
Chloride: 106 mEq/L (ref 96–112)
Creatinine, Ser: 1.5 mg/dL (ref 0.40–1.50)
GFR: 44.45 mL/min — ABNORMAL LOW (ref >60.00)
Glucose, Bld: 101 mg/dL — ABNORMAL HIGH (ref 70–99)
Potassium: 4.2 mEq/L (ref 3.5–5.1)
Sodium: 141 mEq/L (ref 135–145)
Total Bilirubin: 0.3 mg/dL (ref 0.2–1.2)
Total Protein: 7.4 g/dL (ref 6.0–8.3)

## 2021-12-23 LAB — LIPID PANEL
Cholesterol: 109 mg/dL (ref 0–200)
HDL: 41 mg/dL (ref >39.00)
LDL Cholesterol: 48 mg/dL (ref 0–99)
NonHDL: 67.56
Total CHOL/HDL Ratio: 3
Triglycerides: 99 mg/dL (ref 0.0–149.0)
VLDL: 19.8 mg/dL (ref 0.0–40.0)

## 2021-12-23 LAB — POCT URINALYSIS DIP (MANUAL ENTRY)
Bilirubin, UA: NEGATIVE
Glucose, UA: NEGATIVE mg/dL
Ketones, POC UA: NEGATIVE mg/dL
Leukocytes, UA: NEGATIVE
Nitrite, UA: NEGATIVE
Protein Ur, POC: NEGATIVE mg/dL
Spec Grav, UA: 1.015 (ref 1.010–1.025)
Urobilinogen, UA: 0.2 E.U./dL
pH, UA: 6 (ref 5.0–8.0)

## 2021-12-23 LAB — URINALYSIS, MICROSCOPIC ONLY

## 2021-12-23 LAB — TSH: TSH: 2.38 u[IU]/mL (ref 0.35–5.50)

## 2021-12-23 MED ORDER — LEVOTHYROXINE SODIUM 88 MCG PO TABS: 88.0000 ug | ORAL_TABLET | Freq: Every day | ORAL | 1 refills | Status: DC

## 2021-12-23 NOTE — Patient Instructions (Signed)
I will recheck the urine test today to make sure the infection has cleared.  If that is normal and you continue to experience frequent urination I would discuss the symptoms with urologist Dr. Lovena Neighbours. Follow-up as planned with endocrinology for diabetes  I will check cholesterol numbers as well as other labs today and let you know if there are any concerns. Take care!

## 2021-12-24 LAB — URINE CULTURE
MICRO NUMBER:: 13625369
Result:: NO GROWTH
SPECIMEN QUALITY:: ADEQUATE

## 2021-12-31 ENCOUNTER — Ambulatory Visit
Admission: RE | Admit: 2021-12-31 | Discharge: 2021-12-31 | Disposition: A | Discharge status: Home / Self Care | Payer: Medicare Other | Source: Ambulatory Visit | Attending: Internal Medicine | Admitting: Internal Medicine

## 2021-12-31 ENCOUNTER — Encounter: Payer: Self-pay | Admitting: *Deleted

## 2021-12-31 DIAGNOSIS — N2889 Other specified disorders of kidney and ureter: Secondary | ICD-10-CM

## 2021-12-31 HISTORY — PX: IR RADIOLOGIST EVAL & MGMT: IMG5224

## 2021-12-31 NOTE — Progress Notes (Signed)
Chief Complaint: Patient was consulted remotely today (TeleHealth) for follow up after cryoablation of a left renal carcinoma.  History of Present Illness: Carl Gutierrez is a 76 y.o. male status post cryoablation of a 1.8 cm left posterior lower pole renal mass on 12/04/2021. Biopsy of the mass at the time of ablation demonstrated clear-cell renal carcinoma, nuclear grade 2.  He initially did very well following the procedure after overnight observation.  He was diagnosed with an E. coli urinary tract infection on 12/13/2021 after presenting with increased urinary frequency, pain and hematuria.  He was treated with antibiotics and symptoms have resolved.  Past Medical History:  Diagnosis Date   CAD S/P percutaneous coronary angioplasty February 2004; 2008   PCI-dLAD: 2.25 mm x 16 mm Express BMS; Ramus- 2.75 mm x 18 mm Cypher DES (2.8 mm); follow-up 11/'08: Patent LAD/ramus stents. 90% small OM. EF 50-60%.; Myoview 1/'12: Exercise 9 min, 10 METS with no ischemia/infarction.    Clotting disorder (HCC)    Diabetes mellitus    Low-dose metformin   DJD (degenerative joint disease), lumbosacral     status post L4-L5 surgery   Essential hypertension    History of DVT of lower extremity     postoperative   Hyperlipidemia with target LDL less than 70    Hypothyroidism    Synthroid   Panhypopituitarism ([place])    Status post pituitary adenoma removal in 2012   Recurrent deep vein thrombosis (DVT) ([place]) 10/01/2015    Past Surgical History:  Procedure Laterality Date   ARTHROSCOPY KNEE W/ DRILLING Left    BRAIN SURGERY  06/16/2010   Removal of pituitary adenoma   CARDIAC CATHETERIZATION  04/17/2007   Significant LAD tapering but no significant disease the distal stent. Patent Ramus stent. 90% lesion in small OM 2; small nondominant RCA. Medical therapy. EF 50-60%.   CORONARY ANGIOPLASTY WITH STENT PLACEMENT  07/17/2002   PCI-dLAD: 2.25 mm x 16 mm BMS;;PCI- RI: 2.75 mm 18 mm Cypher DES   IR  RADIOLOGIST EVAL & MGMT  11/04/2021   NM MYOVIEW LTD  06/16/2010   a) Exercise ~9 min - 10 METS; no ischemia or infarction; b) 06/2021: LOW RISK.  Normal LV size and function.  EF 55 to 60%.  No RWMA.  No ischemia or infarct.  Fixed inferior basal apical defect with normal motion suggestive of artifact.  (Diaphragmatic attenuation)   PROSTATECTOMY N/A 08/13/2015   Procedure: TRANSVESICLE OPEN PROSTATECTOMY** ;  Surgeon: Carolan Clines, MD;  Location: [facility];  Service: Urology;  Laterality: N/A;   RADIOLOGY WITH ANESTHESIA N/A 12/04/2021   Procedure: LEFT RENAL CRYO ABLATION;  Surgeon: Aletta Edouard, MD;  Location: [facility];  Service: Radiology;  Laterality: N/A;   SPINE SURGERY     L4-L5    Allergies: Hydrochlorothiazide and Other  Medications: Prior to Admission medications   Medication Sig Start Date End Date Taking? Authorizing Provider  Alcohol Swabs (PHARMACIST CHOICE ALCOHOL) PADS SMARTSIG:1 Each Topical 4 Times Daily 07/17/21   [provider]  amLODipine (NORVASC) 10 MG tablet TAKE 1 TABLET BY MOUTH EVERY DAY 11/29/21   Wendie Agreste, MD  aspirin 81 MG tablet Take 81 mg by mouth daily. Reported on 09/05/2015 Patient not taking: Reported on 11/21/2021    [provider]  B-D UF III MINI PEN NEEDLES 31G X 5 MM MISC USE FOR INSULIN PEN TWICE A DAY 04/29/21   Elayne Snare, MD  ciprofloxacin (CIPRO) 500 MG tablet Take 1 tablet (500 mg  total) by mouth 2 (two) times daily. 12/13/21   Eliezer Lofts, FNP  cyclobenzaprine (FLEXERIL) 5 MG tablet Take 1 tablet (5 mg total) by mouth 3 (three) times daily as needed for muscle spasms (start qhs prn due to sedation). Patient not taking: Reported on 08/07/2021 07/15/21   Wendie Agreste, MD  gabapentin (NEURONTIN) 100 MG capsule Take 1 capsule (100 mg total) by mouth 3 (three) times daily. Start QD for first few days, then twice per day. If needed after another week can increase to 3 times per day. Patient not taking: Reported on  09/26/2021 07/29/21   Wendie Agreste, MD  hydrocortisone (CORTEF) 20 MG tablet Take 10-20 mg by mouth See admin instructions. Take 20 mg by mouth in the morning and 10 mg in the evening Patient not taking: Reported on 12/23/2021    [provider]  Insulin Lispro Prot & Lispro (HUMALOG 75/25 MIX) (75-25) 100 UNIT/ML Kwikpen INJECT 20 UNITS UNDER THE SKIN BEFORE BREAKFAST AND 6 UNITS BEFORE SUPPER. (REPLACES 70/30) Patient taking differently: Inject 18-26 Units into the skin See admin instructions. INJECT 26 UNITS UNDER THE SKIN BEFORE BREAKFAST AND 8 UNITS BEFORE SUPPER. (REPLACES 70/30) 10/07/21   Elayne Snare, MD  levothyroxine (SYNTHROID) 88 MCG tablet Take 1 tablet (88 mcg total) by mouth daily. 12/23/21   Wendie Agreste, MD  metoprolol succinate (TOPROL-XL) 25 MG 24 hr tablet TAKE 1 TABLET (25 MG TOTAL) BY MOUTH DAILY. 11/29/21   Wendie Agreste, MD  nitroGLYCERIN (NITROSTAT) 0.4 MG SL tablet Place 1 tablet (0.4 mg total) under the tongue every 5 (five) minutes as needed for chest pain. 07/01/21 11/21/21  Martinique, Peter M, MD  [facility] VERIO test strip USE AS INSTRUCTED TO CHECK BLOOD SUGAR ONCE DAILY. 11/23/20   Elayne Snare, MD  oxyCODONE (ROXICODONE) 5 MG immediate release tablet Take 1 tablet (5 mg total) by mouth every 4 (four) hours as needed for severe pain. 12/05/21   Tyson Alias, NP  rosuvastatin (CRESTOR) 40 MG tablet TAKE 1 TABLET BY MOUTH EVERY DAY 11/29/21   Wendie Agreste, MD  XIGDUO XR 10-998 MG TB24 TAKE 1 TABLET BY MOUTH EVERY DAY 11/29/21   Elayne Snare, MD  testosterone cypionate (DEPO-TESTOSTERONE) 200 MG/ML injection Inject 1.5 mLs (300 mg total) into the muscle every 21 ( twenty-one) days. 11/23/14 03/14/15  Elayne Snare, MD     Family History  Problem Relation Age of Onset   Cancer Mother        pancreatic ca    Social History   Socioeconomic History   Marital status: Married    Spouse name: Not on file   Number of children: Not on file   Years of education:  Not on file   Highest education level: Not on file  Occupational History   Not on file  Tobacco Use   Smoking status: Former    Types: Cigarettes    Quit date: 06/24/1993    Years since quitting: 28.5   Smokeless tobacco: Never  Vaping Use   Vaping Use: Never used  Substance and Sexual Activity   Alcohol use: No   Drug use: No   Sexual activity: Not on file    Comment: Local truck driver, 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 Exercises only occasionally. Not as much as he desires 2. Works as  a short haul truck driver.   He does not smoke cigarettes -- quit in 1995. Does not drink alcohol.   Social Determinants of Health   Financial Resource Strain: Low Risk  (09/26/2021)   Overall Financial Resource Strain (CARDIA)    Difficulty of Paying Living Expenses: Not hard at all  Food Insecurity: No Food Insecurity (09/26/2021)   Hunger Vital Sign    Worried About Running Out of Food in the Last Year: Never true    Ran Out of Food in the Last Year: Never true  Transportation Needs: No Transportation Needs (09/26/2021)   PRAPARE - Hydrologist (Medical): No    Lack of Transportation (Non-Medical): No  Physical Activity: Inactive (09/26/2021)   Exercise Vital Sign    Days of Exercise per Week: 0 days    Minutes of Exercise per Session: 0 min  Stress: No Stress Concern Present (09/26/2021)   [place]    Feeling of Stress : Not at all  Social Connections: Moderately Integrated (09/26/2021)   Social Connection and Isolation Panel [NHANES]    Frequency of Communication with Friends and Family: Twice a week    Frequency of Social Gatherings with Friends and Family: Twice a week    Attends Religious Services: More than 4 times per year    Active Member of [employer] or Organizations: No     Attends Archivist Meetings: Never    Marital Status: Married    ECOG Status: 0 - Asymptomatic  Review of Systems  Constitutional: Negative.   Respiratory: Negative.    Cardiovascular: Negative.   Gastrointestinal: Negative.   Genitourinary: Negative.   Musculoskeletal: Negative.   Neurological: Negative.     Review of Systems: A 12 point ROS discussed and pertinent positives are indicated in the HPI above.  All other systems are negative.   Physical Exam No direct physical exam was performed (except for noted visual exam findings with Video Visits).   Vital Signs: There were no vitals taken for this visit.  Imaging: CT GUIDE TISSUE ABLATION  Result Date: 12/04/2021 CLINICAL DATA:  1.8 cm posterior exophytic lower pole mass of the left kidney. The patient presents for biopsy and planned cryoablation treatment. EXAM: CT-GUIDED CORE BIOPSY OF LEFT RENAL MASS CT-GUIDED PERCUTANEOUS CRYOABLATION OF LEFT RENAL MASS ANESTHESIA/SEDATION: General MEDICATIONS: 2 g IV cefoxitin. The antibiotic was administered in an appropriate time interval prior to needle puncture of the skin. CONTRAST:  None PROCEDURE: The procedure, risks, benefits, and alternatives were explained to the patient. Questions regarding the procedure were encouraged and answered. The patient understands and consents to the procedure. A time-out was performed prior to initiating the procedure. The patient was placed under general anesthesia. Initial unenhanced CT was performed in a prone position to localize the left kidney. Ultrasound was also utilized to localize a posterior left renal mass. The left flank region was prepped with chlorhexidine in a sterile fashion, and a sterile drape was applied covering the operative field. A sterile gown and sterile gloves were used for the procedure. A 17 gauge trocar needle was advanced into the left renal mass. Core biopsy was performed with an 18 gauge automated core biopsy  device. A single core sample was submitted in formalin for pathologic analysis. Under ultrasound and CT guidance, a Varian 2.4 mm percutaneous cryoablation probe was advanced into the left renal mass. Probe positioning was confirmed by CT prior to cryoablation. Cryoablation was performed  through the cryoablation probe. Initial 10 minute cycle of cryoablation was performed. This was followed by an 8 minute thaw cycle. A second 10 minute cycle of cryoablation was then performed. During ablation, periodic CT imaging was performed to monitor ice ball formation and morphology. After active thaw, the cryoablation probe was removed. COMPLICATIONS: None FINDINGS: Partially exophytic posterior lower pole left renal mass is visualized by unenhanced CT and also well visualized by ultrasound. By ultrasound the mass measured approximately 2 cm in greatest diameter. A solid tissue sample was obtained with core biopsy. During cryoablation, CT monitoring demonstrates adequate ice ball formation encompassing the mass. IMPRESSION: Image guided core biopsy and cryoablation of 2 cm left renal mass. The patient will be observed overnight. Electronically Signed   By: Aletta Edouard M.D.   On: 12/04/2021 16:51   CT BIOPSY  Result Date: 12/04/2021 CLINICAL DATA:  1.8 cm posterior exophytic lower pole mass of the left kidney. The patient presents for biopsy and planned cryoablation treatment. EXAM: CT-GUIDED CORE BIOPSY OF LEFT RENAL MASS CT-GUIDED PERCUTANEOUS CRYOABLATION OF LEFT RENAL MASS ANESTHESIA/SEDATION: General MEDICATIONS: 2 g IV cefoxitin. The antibiotic was administered in an appropriate time interval prior to needle puncture of the skin. CONTRAST:  None PROCEDURE: The procedure, risks, benefits, and alternatives were explained to the patient. Questions regarding the procedure were encouraged and answered. The patient understands and consents to the procedure. A time-out was performed prior to initiating the procedure. The  patient was placed under general anesthesia. Initial unenhanced CT was performed in a prone position to localize the left kidney. Ultrasound was also utilized to localize a posterior left renal mass. The left flank region was prepped with chlorhexidine in a sterile fashion, and a sterile drape was applied covering the operative field. A sterile gown and sterile gloves were used for the procedure. A 17 gauge trocar needle was advanced into the left renal mass. Core biopsy was performed with an 18 gauge automated core biopsy device. A single core sample was submitted in formalin for pathologic analysis. Under ultrasound and CT guidance, a Varian 2.4 mm percutaneous cryoablation probe was advanced into the left renal mass. Probe positioning was confirmed by CT prior to cryoablation. Cryoablation was performed through the cryoablation probe. Initial 10 minute cycle of cryoablation was performed. This was followed by an 8 minute thaw cycle. A second 10 minute cycle of cryoablation was then performed. During ablation, periodic CT imaging was performed to monitor ice ball formation and morphology. After active thaw, the cryoablation probe was removed. COMPLICATIONS: None FINDINGS: Partially exophytic posterior lower pole left renal mass is visualized by unenhanced CT and also well visualized by ultrasound. By ultrasound the mass measured approximately 2 cm in greatest diameter. A solid tissue sample was obtained with core biopsy. During cryoablation, CT monitoring demonstrates adequate ice ball formation encompassing the mass. IMPRESSION: Image guided core biopsy and cryoablation of 2 cm left renal mass. The patient will be observed overnight. Electronically Signed   By: Aletta Edouard M.D.   On: 12/04/2021 16:51    Labs:  CBC: Recent Labs    07/08/21 1223 07/15/21 1532 11/28/21 1135  WBC 11.2* 11.4* 9.3  HGB 13.2 13.2 13.1  HCT 42.2 40.3 41.0  PLT 362 357.0 371    COAGS: Recent Labs    11/28/21 1135   INR 1.1    BMP: Recent Labs    07/08/21 1223 08/20/21 0808 10/24/21 0849 11/28/21 1135 12/23/21 1015  NA 137 140 138 141 141  K 4.2 4.4 4.1 4.4 4.2  CL 110 107 107 111 106  CO2 21* '24 22 22 26  '$ GLUCOSE 159* 118* 118* 183* 101*  BUN 23 27* 25* 24* 26*  CALCIUM 8.7* 9.5 9.3 9.3 9.6  CREATININE 1.56* 1.60* 1.68* 1.80* 1.50  GFRNONAA 45*  --   --  38*  --     LIVER FUNCTION TESTS: Recent Labs    06/19/21 0842 12/23/21 1015  BILITOT 0.3 0.3  AST 13 17  ALT 15 14  ALKPHOS 75 80  PROT 7.1 7.4  ALBUMIN 4.1 4.3     Assessment and Plan:  Mr. Borges is doing well following cryoablation of a left clear-cell renal carcinoma.  He did have a urinary tract infection following the procedure which was likely related to catheterization and Foley catheter placement as the ablation was of a peripheral exophytic lesion with ablation not extending into the collecting system.  Infection appears to have completely cleared with no further symptoms.  I recommended a follow-up MRI in late August.  We will check renal function prior to imaging to check renal function and see if he can receive gadolinium in the setting of known chronic kidney disease.  He has been able to receive gadolinium in the past.    Electronically Signed: Azzie Roup 12/31/2021, 11:27 AM    I spent a total of 10 Minutes in remote  clinical consultation, greater than 50% of which was counseling/coordinating care post cryoablation of a left clear-cell renal carcinoma.    Visit type: Audio only (telephone). Audio (no video) only due to patient's lack of internet/smartphone capability. Alternative for in-person consultation at [facility], [address], [place], [place]. This visit type was conducted due to national recommendations for restrictions regarding the COVID-19 Pandemic (e.g. social distancing).  This format is felt to be most appropriate for this patient at this time.  All issues noted in this  document were discussed and addressed.

## 2022-01-01 ENCOUNTER — Telehealth: Payer: Self-pay

## 2022-01-01 NOTE — Telephone Encounter (Signed)
Pt was contacted is aware of results and will contact if continued sxs

## 2022-01-01 NOTE — Telephone Encounter (Signed)
-----   Message from Wendie Agreste, MD sent at 12/29/2021 10:48 AM EDT ----- Call patient.   Electrolytes overall looked okay.  Thyroid test was normal.  Cholesterol levels looked okay.  Kidney function test improved.  Urine test did show white blood cells or infection fighting cells, but urine culture did not grow any specific bacteria.  If frequent urination has not improved I recommend repeat visit as we may need to repeat antibiotics.  However, with no bacteria on urine culture I think it is okay to wait on further antibiotics as long as symptoms are resolving.  Should recheck urine testing in the next week or 2.  Let me know if there are questions.

## 2022-01-02 ENCOUNTER — Other Ambulatory Visit (INDEPENDENT_AMBULATORY_CARE_PROVIDER_SITE_OTHER): Payer: Medicare Other

## 2022-01-02 DIAGNOSIS — E1165 Type 2 diabetes mellitus with hyperglycemia: Secondary | ICD-10-CM

## 2022-01-02 DIAGNOSIS — Z794 Long term (current) use of insulin: Secondary | ICD-10-CM | POA: Diagnosis not present

## 2022-01-02 DIAGNOSIS — E230 Hypopituitarism: Secondary | ICD-10-CM

## 2022-01-02 LAB — BASIC METABOLIC PANEL
BUN: 21 mg/dL (ref 6–23)
CO2: 23 mEq/L (ref 19–32)
Calcium: 9.4 mg/dL (ref 8.4–10.5)
Chloride: 107 mEq/L (ref 96–112)
Creatinine, Ser: 1.41 mg/dL (ref 0.40–1.50)
GFR: 47.87 mL/min — ABNORMAL LOW (ref >60.00)
Glucose, Bld: 96 mg/dL (ref 70–99)
Potassium: 4.2 mEq/L (ref 3.5–5.1)
Sodium: 140 mEq/L (ref 135–145)

## 2022-01-02 LAB — HEMOGLOBIN A1C: Hgb A1c MFr Bld: 7.1 % — ABNORMAL HIGH (ref 4.6–6.5)

## 2022-01-02 LAB — T4, FREE: Free T4: 0.96 ng/dL (ref 0.60–1.60)

## 2022-01-06 ENCOUNTER — Other Ambulatory Visit: Payer: Self-pay | Admitting: Interventional Radiology

## 2022-01-06 ENCOUNTER — Other Ambulatory Visit: Payer: Self-pay | Admitting: Family Medicine

## 2022-01-06 DIAGNOSIS — N2889 Other specified disorders of kidney and ureter: Secondary | ICD-10-CM

## 2022-01-08 ENCOUNTER — Ambulatory Visit: Payer: Medicare Other | Admitting: Endocrinology

## 2022-01-08 ENCOUNTER — Encounter: Payer: Self-pay | Admitting: Endocrinology

## 2022-01-08 VITALS — BP 124/68 | HR 80 | Ht 71.5 in | Wt 224.0 lb

## 2022-01-08 DIAGNOSIS — E230 Hypopituitarism: Secondary | ICD-10-CM | POA: Diagnosis not present

## 2022-01-08 DIAGNOSIS — E1165 Type 2 diabetes mellitus with hyperglycemia: Secondary | ICD-10-CM | POA: Diagnosis not present

## 2022-01-08 DIAGNOSIS — Z794 Long term (current) use of insulin: Secondary | ICD-10-CM | POA: Diagnosis not present

## 2022-01-08 NOTE — Progress Notes (Signed)
Patient ID: Carl Gutierrez, male   DOB: Sep 04, 1945, 76 y.o.   MRN: [id number]           Reason for Appointment: Endocrinology follow-up  History of Present Illness   DIABETES type II:  Recent history:     INSULIN regimen: Humalog 75/25 insulin, 26 units before breakfast and 8 before dinner  Non-insulin hypoglycemic drugs: Xigduo 10/998 daily  His A1c is most recently 7.1, was 7.6 before   Current management, blood sugar patterns and problems identified: He did not bring his monitor for download today He has had some weight loss and this may have been possibly from a procedure he had done  He has been continuing to use Xigduo  Also recently renal function appears to be relatively better  He thinks previously had some higher readings after dinner but does not think they are high now Also his lab glucose was 96 fasting and also about 2 weeks ago it was 101 elsewhere  He still has limitations in walking because of his lower extremity pain  Portions are better controlled Has been using his twice a day doses of insulin as directed   Side effects from medications: None      Monitors blood glucose:  Once or twice a day.    Glucometer:  One Touch           Blood Glucose readings from recall:  111-131 both morning and evening after dinner Previously:   PRE-MEAL Fasting Lunch Dinner Bedtime Overall  Glucose range:   119-183    Mean/median:   150  /150   POST-MEAL PC Breakfast PC Lunch PC Dinner  Glucose range: 168  134-180  Mean/median:      Dietician visit: Most recent: 11/2012     Weight history   Wt Readings from Last 3 Encounters:  01/08/22 224 lb (101.6 kg)  12/23/21 220 lb 6.4 oz (100 kg)  12/04/21 222 lb (100.7 kg)           Diabetes labs:  Lab Results  Component Value Date   HGBA1C 7.1 (H) 01/02/2022   HGBA1C 6.9 (H) 11/28/2021   HGBA1C 8.1 (H) 08/20/2021   Lab Results  Component Value Date   MICROALBUR 4.5 (H) 04/19/2021   LDLCALC 48 12/23/2021    CREATININE 1.41 01/02/2022   Other problems discussed today: See review of systems  Allergies as of 01/08/2022       Reactions   Hydrochlorothiazide    Leg and side pain with this   Other    Blood pressure med name unknown.        Medication List        Accurate as of January 08, 2022  9:59 AM. If you have any questions, ask your nurse or doctor.          amLODipine 10 MG tablet Commonly known as: NORVASC TAKE 1 TABLET BY MOUTH EVERY DAY   aspirin 81 MG tablet Take 81 mg by mouth daily. Reported on 09/05/2015   B-D UF III MINI PEN NEEDLES 31G X 5 MM Misc Generic drug: Insulin Pen Needle USE FOR INSULIN PEN TWICE A DAY   ciprofloxacin 500 MG tablet Commonly known as: CIPRO Take 1 tablet (500 mg total) by mouth 2 (two) times daily.   cyclobenzaprine 5 MG tablet Commonly known as: FLEXERIL Take 1 tablet (5 mg total) by mouth 3 (three) times daily as needed for muscle spasms (start qhs prn due to sedation).   gabapentin 100 MG  capsule Commonly known as: NEURONTIN Take 1 capsule (100 mg total) by mouth 3 (three) times daily. Start QD for first few days, then twice per day. If needed after another week can increase to 3 times per day.   hydrocortisone 20 MG tablet Commonly known as: CORTEF Take 10-20 mg by mouth See admin instructions. Take 20 mg by mouth in the morning and 10 mg in the evening   Insulin Lispro Prot & Lispro (75-25) 100 UNIT/ML Kwikpen Commonly known as: HUMALOG 75/25 MIX INJECT 20 UNITS UNDER THE SKIN BEFORE BREAKFAST AND 6 UNITS BEFORE SUPPER. (REPLACES 70/30) What changed:  how much to take how to take this when to take this additional instructions   levothyroxine 88 MCG tablet Commonly known as: SYNTHROID Take 1 tablet (88 mcg total) by mouth daily.   metoprolol succinate 25 MG 24 hr tablet Commonly known as: TOPROL-XL TAKE 1 TABLET (25 MG TOTAL) BY MOUTH DAILY.   nitroGLYCERIN 0.4 MG SL tablet Commonly known as: NITROSTAT Place 1  tablet (0.4 mg total) under the tongue every 5 (five) minutes as needed for chest pain.   OneTouch Verio test strip Generic drug: glucose blood USE AS INSTRUCTED TO CHECK BLOOD SUGAR ONCE DAILY.   oxyCODONE 5 MG immediate release tablet Commonly known as: Roxicodone Take 1 tablet (5 mg total) by mouth every 4 (four) hours as needed for severe pain.   Pharmacist Choice Alcohol Pads SMARTSIG:1 Each Topical 4 Times Daily   rosuvastatin 40 MG tablet Commonly known as: CRESTOR TAKE 1 TABLET BY MOUTH EVERY DAY   Xigduo XR 10-998 MG Tb24 Generic drug: Dapagliflozin-metFORMIN HCl ER TAKE 1 TABLET BY MOUTH EVERY DAY        Allergies:  Allergies  Allergen Reactions   Hydrochlorothiazide     Leg and side pain with this   Other     Blood pressure med name unknown.    Past Medical History:  Diagnosis Date   CAD S/P percutaneous coronary angioplasty February 2004; 2008   PCI-dLAD: 2.25 mm x 16 mm Express BMS; Ramus- 2.75 mm x 18 mm Cypher DES (2.8 mm); follow-up 11/'08: Patent LAD/ramus stents. 90% small OM. EF 50-60%.; Myoview 1/'12: Exercise 9 min, 10 METS with no ischemia/infarction.    Clotting disorder (HCC)    Diabetes mellitus    Low-dose metformin   DJD (degenerative joint disease), lumbosacral     status post L4-L5 surgery   Essential hypertension    History of DVT of lower extremity     postoperative   Hyperlipidemia with target LDL less than 70    Hypothyroidism    Synthroid   Panhypopituitarism ([place])    Status post pituitary adenoma removal in 2012   Recurrent deep vein thrombosis (DVT) ([place]) 10/01/2015    Past Surgical History:  Procedure Laterality Date   ARTHROSCOPY KNEE W/ DRILLING Left    BRAIN SURGERY  06/16/2010   Removal of pituitary adenoma   CARDIAC CATHETERIZATION  04/17/2007   Significant LAD tapering but no significant disease the distal stent. Patent Ramus stent. 90% lesion in small OM 2; small nondominant RCA. Medical therapy. EF 50-60%.    CORONARY ANGIOPLASTY WITH STENT PLACEMENT  07/17/2002   PCI-dLAD: 2.25 mm x 16 mm BMS;;PCI- RI: 2.75 mm 18 mm Cypher DES   IR RADIOLOGIST EVAL & MGMT  11/04/2021   IR RADIOLOGIST EVAL & MGMT  12/31/2021   NM MYOVIEW LTD  06/16/2010   a) Exercise ~9 min - 10 METS; no ischemia or  infarction; b) 06/2021: LOW RISK.  Normal LV size and function.  EF 55 to 60%.  No RWMA.  No ischemia or infarct.  Fixed inferior basal apical defect with normal motion suggestive of artifact.  (Diaphragmatic attenuation)   PROSTATECTOMY N/A 08/13/2015   Procedure: TRANSVESICLE OPEN PROSTATECTOMY** ;  Surgeon: Carolan Clines, MD;  Location: [facility];  Service: Urology;  Laterality: N/A;   RADIOLOGY WITH ANESTHESIA N/A 12/04/2021   Procedure: LEFT RENAL CRYO ABLATION;  Surgeon: Aletta Edouard, MD;  Location: [facility];  Service: Radiology;  Laterality: N/A;   SPINE SURGERY     L4-L5    Family History  Problem Relation Age of Onset   Cancer Mother        pancreatic ca    Social History:  reports that he quit smoking about 28 years ago. His smoking use included cigarettes. He has never used smokeless tobacco. He reports that he does not drink alcohol and does not use drugs.  Review of Systems:  No further recurrence of gout, treated with colchicine in 5/22 Not on allopurinol   Lab Results  Component Value Date   LABURIC 8.0 (H) 01/24/2021     Hypertension:  He is taking 10 mg amlodipine and lisinopril 5 mg, amlodipine prescribed by his cardiologist   BP Readings from Last 3 Encounters:  01/08/22 124/68  12/23/21 128/74  12/13/21 105/73   Renal function history:   Creatinine is relatively better now   He had a high urine microalbumin in 9/22 with his [facility] but normal here  Lab Results  Component Value Date   CREATININE 1.41 01/02/2022   CREATININE 1.50 12/23/2021   CREATININE 1.80 (H) 11/28/2021    HYPOPITUITARISM: He has been taking levothyroxine and Cortef as prescribed Takes  Cortef 20 mg in the morning and 10 mg in the late afternoon  Hypothyroidism: No recent fatigue Free T4 has been consistently normal with  dosage of 88 mcg levothyroxine  Lab Results  Component Value Date   TSH 2.38 12/23/2021   TSH 4.57 04/19/2021   TSH 4.64 (H) 12/05/2020   FREET4 0.96 01/02/2022   FREET4 1.11 08/20/2021   FREET4 0.97 04/19/2021    Eye exams: He gets these done at the [facility]   Hypercholesterolemia: Treated with rosuvastatin by PCP  Lab Results  Component Value Date   CHOL 109 12/23/2021   HDL 41.00 12/23/2021   LDLCALC 48 12/23/2021   LDLDIRECT 85.0 05/18/2014   TRIG 99.0 12/23/2021   CHOLHDL 3 12/23/2021        LABS:  Lab on 01/02/2022  Component Date Value Ref Range Status   Free T4 01/02/2022 0.96  0.60 - 1.60 ng/dL Final   Comment: Specimens from patients who are undergoing biotin therapy and /or ingesting biotin supplements may contain high levels of biotin.  The higher biotin concentration in these specimens interferes with this Free T4 assay.  Specimens that contain high levels  of biotin may cause false high results for this Free T4 assay.  Please interpret results in light of the total clinical presentation of the patient.     Sodium 01/02/2022 140  135 - 145 mEq/L Final   Potassium 01/02/2022 4.2  3.5 - 5.1 mEq/L Final   Chloride 01/02/2022 107  96 - 112 mEq/L Final   CO2 01/02/2022 23  19 - 32 mEq/L Final   Glucose, Bld 01/02/2022 96  70 - 99 mg/dL Final   BUN 01/02/2022 21  6 - 23 mg/dL Final  Creatinine, Ser 01/02/2022 1.41  0.40 - 1.50 mg/dL Final   GFR 01/02/2022 47.87 (L)  >60.00 mL/min Final   Calculated using the CKD-EPI Creatinine Equation (2021)   Calcium 01/02/2022 9.4  8.4 - 10.5 mg/dL Final   Hgb A1c MFr Bld 01/02/2022 7.1 (H)  4.6 - 6.5 % Final   Glycemic Control Guidelines for People with Diabetes:Non Diabetic:  <6%Goal of Therapy: <7%Additional Action Suggested:  >8%      Examination:   BP 124/68   Pulse  80   Ht 5' 11.5" (1.816 m)   Wt 224 lb (101.6 kg)   SpO2 94%   BMI 30.81 kg/m   Body mass index is 30.81 kg/m.    ASSESSMENT/ PLAN:    Diabetes type 2 with mild obesity:   See history of present illness for detailed discussion of current diabetes management, blood sugar patterns and problems identified  A1c is 7.1   He is on premixed insulin twice a day and Xigduo   His blood sugars are appearing to be improving especially with recent weight loss Also likely being consistent with diet No hypoglycemia reported also with majority of his insulin in the morning However unclear how often he is monitoring his sugars as he did not bring his meter  RENAL dysfunction: Slightly better and not clear what factors are affecting this  Hypertension: Blood pressure is again normal with no orthostatic dizziness  History of pituitary tumor: Thyroid levels are quite normal and he will continue 88 mcg levothyroxine  PLAN: Check blood sugars alternating fasting and after meal Encouraged him to be as active as possible No change in insulin as yet but call if blood sugars are unusually high Follow-up in 4 months   There are no Patient Instructions on file for this visit.     Elayne Snare 01/08/2022, 9:59 AM

## 2022-01-15 ENCOUNTER — Other Ambulatory Visit: Payer: Self-pay | Admitting: *Deleted

## 2022-01-15 ENCOUNTER — Other Ambulatory Visit: Payer: Self-pay | Admitting: Interventional Radiology

## 2022-01-15 DIAGNOSIS — N2889 Other specified disorders of kidney and ureter: Secondary | ICD-10-CM

## 2022-02-13 ENCOUNTER — Other Ambulatory Visit: Payer: Self-pay | Admitting: Endocrinology

## 2022-02-19 ENCOUNTER — Ambulatory Visit (HOSPITAL_COMMUNITY)
Admission: RE | Admit: 2022-02-19 | Discharge: 2022-02-19 | Disposition: A | Discharge status: Home / Self Care | Payer: Medicare Other | Source: Ambulatory Visit | Attending: Family Medicine | Admitting: Family Medicine

## 2022-02-19 DIAGNOSIS — N2889 Other specified disorders of kidney and ureter: Secondary | ICD-10-CM | POA: Diagnosis not present

## 2022-02-19 MED ORDER — GADOBUTROL 1 MMOL/ML IV SOLN
10.0000 mL | Freq: Once | INTRAVENOUS | Status: AC | PRN
  Administered 2022-02-19: 10 mL via INTRAVENOUS

## 2022-02-20 LAB — CREATININE WITH EST GFR
Creat: 1.62 mg/dL — ABNORMAL HIGH (ref 0.70–1.28)
eGFR: 43 mL/min/{1.73_m2} — ABNORMAL LOW (ref >=60)

## 2022-02-20 LAB — BUN: BUN: 25 mg/dL (ref 7–25)

## 2022-02-27 ENCOUNTER — Telehealth: Payer: Medicare Other

## 2022-02-27 ENCOUNTER — Ambulatory Visit
Admission: RE | Admit: 2022-02-27 | Discharge: 2022-02-27 | Disposition: A | Discharge status: Home / Self Care | Payer: Medicare Other | Source: Ambulatory Visit | Attending: Interventional Radiology | Admitting: Interventional Radiology

## 2022-02-27 ENCOUNTER — Encounter: Payer: Self-pay | Admitting: *Deleted

## 2022-02-27 DIAGNOSIS — N2889 Other specified disorders of kidney and ureter: Secondary | ICD-10-CM

## 2022-02-27 HISTORY — PX: IR RADIOLOGIST EVAL & MGMT: IMG5224

## 2022-02-27 NOTE — Progress Notes (Signed)
Chief Complaint: Patient was consulted remotely today (TeleHealth) for follow up after cryoablation of a left renal carcinoma.  History of Present Illness: Carl Gutierrez is a 76 y.o. male status post cryoablation of a 1.8 cm left posterior lower pole biopsy-proven clear-cell renal carcinoma on 12/04/2021.  He states that occasionally with movement he has mild soreness in the left flank region but otherwise no significant pain.  He denies any urinary symptoms.   Past Medical History:  Diagnosis Date   CAD S/P percutaneous coronary angioplasty February 2004; 2008   PCI-dLAD: 2.25 mm x 16 mm Express BMS; Ramus- 2.75 mm x 18 mm Cypher DES (2.8 mm); follow-up 11/'08: Patent LAD/ramus stents. 90% small OM. EF 50-60%.; Myoview 1/'12: Exercise 9 min, 10 METS with no ischemia/infarction.    Clotting disorder (HCC)    Diabetes mellitus    Low-dose metformin   DJD (degenerative joint disease), lumbosacral     status post L4-L5 surgery   Essential hypertension    History of DVT of lower extremity     postoperative   Hyperlipidemia with target LDL less than 70    Hypothyroidism    Synthroid   Panhypopituitarism ([place])    Status post pituitary adenoma removal in 2012   Recurrent deep vein thrombosis (DVT) ([place]) 10/01/2015    Past Surgical History:  Procedure Laterality Date   ARTHROSCOPY KNEE W/ DRILLING Left    BRAIN SURGERY  06/16/2010   Removal of pituitary adenoma   CARDIAC CATHETERIZATION  04/17/2007   Significant LAD tapering but no significant disease the distal stent. Patent Ramus stent. 90% lesion in small OM 2; small nondominant RCA. Medical therapy. EF 50-60%.   CORONARY ANGIOPLASTY WITH STENT PLACEMENT  07/17/2002   PCI-dLAD: 2.25 mm x 16 mm BMS;;PCI- RI: 2.75 mm 18 mm Cypher DES   IR RADIOLOGIST EVAL & MGMT  11/04/2021   IR RADIOLOGIST EVAL & MGMT  12/31/2021   NM MYOVIEW LTD  06/16/2010   a) Exercise ~9 min - 10 METS; no ischemia or infarction; b) 06/2021: LOW RISK.  Normal  LV size and function.  EF 55 to 60%.  No RWMA.  No ischemia or infarct.  Fixed inferior basal apical defect with normal motion suggestive of artifact.  (Diaphragmatic attenuation)   PROSTATECTOMY N/A 08/13/2015   Procedure: TRANSVESICLE OPEN PROSTATECTOMY** ;  Surgeon: Carolan Clines, MD;  Location[facility];  Service: Urology;  Laterality: N/A;   RADIOLOGY WITH ANESTHESIA N/A 12/04/2021   Procedure: LEFT RENAL CRYO ABLATION;  Surgeon: Aletta Edouard, MD;  Location: [facility];  Service: Radiology;  Laterality: N/A;   SPINE SURGERY     L4-L5    Allergies: Hydrochlorothiazide and Other  Medications: Prior to Admission medications   Medication Sig Start Date End Date Taking? Authorizing Provider  Alcohol Swabs (PHARMACIST CHOICE ALCOHOL) PADS SMARTSIG:1 Each Topical 4 Times Daily 07/17/21   [provider]  amLODipine (NORVASC) 10 MG tablet TAKE 1 TABLET BY MOUTH EVERY DAY 11/29/21   Wendie Agreste, MD  aspirin 81 MG tablet Take 81 mg by mouth daily. Reported on 09/05/2015 Patient not taking: Reported on 11/21/2021    [provider]  B-D UF III MINI PEN NEEDLES 31G X 5 MM MISC USE FOR INSULIN PEN TWICE A DAY 04/29/21   Elayne Snare, MD  ciprofloxacin (CIPRO) 500 MG tablet Take 1 tablet (500 mg total) by mouth 2 (two) times daily. 12/13/21   Eliezer Lofts, FNP  cyclobenzaprine (FLEXERIL) 5 MG tablet Take 1  tablet (5 mg total) by mouth 3 (three) times daily as needed for muscle spasms (start qhs prn due to sedation). Patient not taking: Reported on 08/07/2021 07/15/21   Wendie Agreste, MD  gabapentin (NEURONTIN) 100 MG capsule Take 1 capsule (100 mg total) by mouth 3 (three) times daily. Start QD for first few days, then twice per day. If needed after another week can increase to 3 times per day. Patient not taking: Reported on 09/26/2021 07/29/21   Wendie Agreste, MD  hydrocortisone (CORTEF) 20 MG tablet Take 10-20 mg by mouth See admin instructions. Take 20 mg by mouth in the  morning and 10 mg in the evening Patient not taking: Reported on 12/23/2021    [provider]  Insulin Lispro Prot & Lispro (HUMALOG 75/25 MIX) (75-25) 100 UNIT/ML Kwikpen INJECT 26 UNITS IN AM AND 8 BEFORE SUPPER 02/14/22   Elayne Snare, MD  levothyroxine (SYNTHROID) 88 MCG tablet Take 1 tablet (88 mcg total) by mouth daily. 12/23/21   Wendie Agreste, MD  metoprolol succinate (TOPROL-XL) 25 MG 24 hr tablet TAKE 1 TABLET (25 MG TOTAL) BY MOUTH DAILY. 11/29/21   Wendie Agreste, MD  nitroGLYCERIN (NITROSTAT) 0.4 MG SL tablet Place 1 tablet (0.4 mg total) under the tongue every 5 (five) minutes as needed for chest pain. 07/01/21 11/21/21  Martinique, Peter M, MD  [facility] VERIO test strip USE AS INSTRUCTED TO CHECK BLOOD SUGAR ONCE DAILY. 11/23/20   Elayne Snare, MD  oxyCODONE (ROXICODONE) 5 MG immediate release tablet Take 1 tablet (5 mg total) by mouth every 4 (four) hours as needed for severe pain. 12/05/21   Tyson Alias, NP  rosuvastatin (CRESTOR) 40 MG tablet TAKE 1 TABLET BY MOUTH EVERY DAY 11/29/21   Wendie Agreste, MD  XIGDUO XR 10-998 MG TB24 TAKE 1 TABLET BY MOUTH EVERY DAY 11/29/21   Elayne Snare, MD  testosterone cypionate (DEPO-TESTOSTERONE) 200 MG/ML injection Inject 1.5 mLs (300 mg total) into the muscle every 21 ( twenty-one) days. 11/23/14 03/14/15  Elayne Snare, MD     Family History  Problem Relation Age of Onset   Cancer Mother        pancreatic ca    Social History   Socioeconomic History   Marital status: Married    Spouse name: Not on file   Number of children: Not on file   Years of education: Not on file   Highest education level: Not on file  Occupational History   Not on file  Tobacco Use   Smoking status: Former    Types: Cigarettes    Quit date: 06/24/1993    Years since quitting: 28.6   Smokeless tobacco: Never  Vaping Use   Vaping Use: Never used  Substance and Sexual Activity   Alcohol use: No   Drug use: No   Sexual activity: Not on file    Comment:  Local truck driver, 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 Exercises only occasionally. Not as much as he desires 2. 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   He does not smoke cigarettes -- quit in 1995. Does not drink alcohol.   Social Determinants of Health   Financial Resource Strain: Low Risk  (09/26/2021)   Overall Financial Resource Strain (CARDIA)    Difficulty of Paying Living Expenses: Not hard at all  Food Insecurity: No Food Insecurity (09/26/2021)   Hunger Vital Sign    Worried About Running Out of Food in the Last Year: Never true    Ran Out of Food in the Last Year: Never true  Transportation Needs: No Transportation Needs (09/26/2021)   PRAPARE - Hydrologist (Medical): No    Lack of Transportation (Non-Medical): No  Physical Activity: Inactive (09/26/2021)   Exercise Vital Sign    Days of Exercise per Week: 0 days    Minutes of Exercise per Session: 0 min  Stress: No Stress Concern Present (09/26/2021)   [place]    Feeling of Stress : Not at all  Social Connections: Moderately Integrated (09/26/2021)   Social Connection and Isolation Panel [NHANES]    Frequency of Communication with Friends and Family: Twice a week    Frequency of Social Gatherings with Friends and Family: Twice a week    Attends Religious Services: More than 4 times per year    Active Member of [employer] or Organizations: No    Attends Archivist Meetings: Never    Marital Status: Married    ECOG Status: 0 - Asymptomatic  Review of Systems  Constitutional: Negative.   Respiratory: Negative.    Cardiovascular: Negative.   Gastrointestinal: Negative.   Genitourinary:  Negative for dysuria, frequency and hematuria.       Mild occasional left flank soreness with movement.  Musculoskeletal:  Negative.   Neurological: Negative.     Review of Systems: A 12 point ROS discussed and pertinent positives are indicated in the HPI above.  All other systems are negative.    Physical Exam No direct physical exam was performed (except for noted visual exam findings with Video Visits).   Vital Signs: There were no vitals taken for this visit.  Imaging: MR ABDOMEN WWO CONTRAST  Result Date: 02/19/2022 CLINICAL DATA:  Status post left renal cell carcinoma cryoablation EXAM: MRI ABDOMEN WITHOUT AND WITH CONTRAST TECHNIQUE: Multiplanar multisequence MR imaging of the abdomen was performed both before and after the administration of intravenous contrast. CONTRAST:  79m GADAVIST GADOBUTROL 1 MMOL/ML IV SOLN COMPARISON:  Cryoablation procedure, 12/04/2021, MR abdomen, 07/31/2021 FINDINGS: Lower chest: No acute abnormality. Hepatobiliary: No solid liver abnormality is seen. No gallstones, gallbladder wall thickening, or biliary dilatation. Pancreas: Unremarkable. No pancreatic ductal dilatation or surrounding inflammatory changes. Spleen: Normal in size without significant abnormality. Adrenals/Urinary Tract: Adrenal glands are unremarkable. Percutaneous ablation site of the posterior inferior pole of the left kidney, with a small volume of internal fluid and rim enhancement as well as some internal signal dropout suggesting calcification, measuring approximately 3.7 x 3.1 cm (series 20, image 71). No suspicious arterial phase contrast enhancement or soft tissue. Multiple additional simple and thinly septated benign bilateral renal cortical cysts, without associated contrast enhancement, as well as multiple subcentimeter lesions too small to characterize although almost certainly additional small cysts. No specific further follow-up or characterization is required for these benign cysts. Stomach/Bowel: Stomach is within normal limits. No evidence of bowel wall thickening, distention, or inflammatory  changes. Vascular/Lymphatic: No significant vascular findings are present. No enlarged abdominal lymph nodes. Other: No abdominal wall hernia or abnormality. No ascites. Musculoskeletal: No acute or significant osseous findings. IMPRESSION: 1. Interval percutaneous ablation of the posterior inferior pole of the left kidney, with a small volume of internal fluid and rim enhancement, overall lesion measuring approximately 3.7 x 3.1 cm. This may  reflect calyceal injury following ablation and urine leak into the ablation cavity, or perhaps alternately fat necrosis and liquification with adjacent organizing scar tissue and granulation. Delayed phases on today's examination are insufficient to clearly demonstrate presence or absence of excreted contrast communicating into this cavity and could be further interrogated by delayed phase contrast enhanced CT performed specifically for this purpose. 2. No findings suspicious for local residual recurrent disease and no evidence of lymphadenopathy or metastatic disease in the abdomen. Findings discussed by telephone with Dr. Kathlene Cote, 4:50 PM, 02/19/2022. Electronically Signed   By: Delanna Ahmadi M.D.   On: 02/19/2022 16:58    Labs:  CBC: Recent Labs    07/08/21 1223 07/15/21 1532 11/28/21 1135  WBC 11.2* 11.4* 9.3  HGB 13.2 13.2 13.1  HCT 42.2 40.3 41.0  PLT 362 357.0 371    COAGS: Recent Labs    11/28/21 1135  INR 1.1    BMP: Recent Labs    07/08/21 1223 08/20/21 0808 10/24/21 0849 11/28/21 1135 12/23/21 1015 01/02/22 0809 02/19/22 1026  NA 137   < > 138 141 141 140  --   K 4.2   < > 4.1 4.4 4.2 4.2  --   CL 110   < > 107 111 106 107  --   CO2 21*   < > '22 22 26 23  '$ --   GLUCOSE 159*   < > 118* 183* 101* 96  --   BUN 23   < > 25* 24* 26* 21 25  CALCIUM 8.7*   < > 9.3 9.3 9.6 9.4  --   CREATININE 1.56*   < > 1.68* 1.80* 1.50 1.41 1.62*  GFRNONAA 45*  --   --  38*  --   --   --    < > = values in this interval not displayed.    LIVER  FUNCTION TESTS: Recent Labs    06/19/21 0842 12/23/21 1015  BILITOT 0.3 0.3  AST 13 17  ALT 15 14  ALKPHOS 75 80  PROT 7.1 7.4  ALBUMIN 4.1 4.3     Assessment and Plan:  I spoke with Mr. Preslar over the phone.  Review of the follow-up MRI dated 02/19/2022 demonstrates no evidence of abnormal enhancement to suggest recurrent left renal carcinoma after ablation.  There was some question raised by MRI of possible contained urinoma at the site of ablation.  He is relatively asymptomatic and there is no evidence to suggest infection at this time clinically.  I recommended a follow-up MRI in 1 year.  Given his history of chronic kidney disease, MRI is still favored for follow-up compared to a CT with iodinated contrast.   Electronically Signed: Azzie Roup 02/27/2022, 12:58 PM    I spent a total of 10 Minutes in remote  clinical consultation, greater than 50% of which was counseling/coordinating care post cryoablation of a left renal carcinoma.    Visit type: Audio only (telephone). Audio (no video) only due to patient's lack of internet/smartphone capability. Alternative for in-person consultation at [facility], [address], [place], [place]. This visit type was conducted due to national recommendations for restrictions regarding the COVID-19 Pandemic (e.g. social distancing).  This format is felt to be most appropriate for this patient at this time.  All issues noted in this document were discussed and addressed.

## 2022-04-11 ENCOUNTER — Other Ambulatory Visit: Payer: Self-pay

## 2022-04-18 ENCOUNTER — Other Ambulatory Visit: Payer: Self-pay | Admitting: Endocrinology

## 2022-05-06 ENCOUNTER — Other Ambulatory Visit (INDEPENDENT_AMBULATORY_CARE_PROVIDER_SITE_OTHER): Payer: Medicare Other

## 2022-05-06 DIAGNOSIS — Z794 Long term (current) use of insulin: Secondary | ICD-10-CM | POA: Diagnosis not present

## 2022-05-06 DIAGNOSIS — E1165 Type 2 diabetes mellitus with hyperglycemia: Secondary | ICD-10-CM

## 2022-05-06 LAB — BASIC METABOLIC PANEL
BUN: 23 mg/dL (ref 6–23)
CO2: 23 mEq/L (ref 19–32)
Calcium: 9 mg/dL (ref 8.4–10.5)
Chloride: 108 mEq/L (ref 96–112)
Creatinine, Ser: 1.56 mg/dL — ABNORMAL HIGH (ref 0.40–1.50)
GFR: 42.3 mL/min — ABNORMAL LOW (ref >60.00)
Glucose, Bld: 119 mg/dL — ABNORMAL HIGH (ref 70–99)
Potassium: 4 mEq/L (ref 3.5–5.1)
Sodium: 138 mEq/L (ref 135–145)

## 2022-05-06 LAB — HEMOGLOBIN A1C: Hgb A1c MFr Bld: 7.5 % — ABNORMAL HIGH (ref 4.6–6.5)

## 2022-05-13 ENCOUNTER — Other Ambulatory Visit: Payer: Medicare Other

## 2022-05-15 ENCOUNTER — Ambulatory Visit: Payer: Medicare Other | Admitting: Endocrinology

## 2022-05-15 ENCOUNTER — Encounter: Payer: Self-pay | Admitting: Endocrinology

## 2022-05-15 VITALS — BP 122/78 | HR 84 | Ht 69.0 in | Wt 227.6 lb

## 2022-05-15 DIAGNOSIS — E1165 Type 2 diabetes mellitus with hyperglycemia: Secondary | ICD-10-CM

## 2022-05-15 DIAGNOSIS — Z794 Long term (current) use of insulin: Secondary | ICD-10-CM

## 2022-05-15 DIAGNOSIS — E230 Hypopituitarism: Secondary | ICD-10-CM

## 2022-05-15 MED ORDER — ONETOUCH VERIO VI STRP
ORAL_STRIP | 3 refills | Status: DC
Start: 2022-05-15 — End: 2022-10-05

## 2022-05-15 NOTE — Patient Instructions (Addendum)
Am shot 28 units  Less bread  Make sure you have hydrocortisone

## 2022-05-15 NOTE — Progress Notes (Signed)
Patient ID: Carl Gutierrez, male   DOB: Dec 25, 1945, 76 y.o.   MRN: [id number]           Reason for Appointment: Endocrinology follow-up  History of Present Illness   DIABETES type II:  Recent history:     INSULIN regimen: Humalog 75/25 insulin, 26 units before breakfast and 8 before dinner  Non-insulin hypoglycemic drugs: Xigduo 10/998 daily  His A1c is most recently 7.1, was 7.6 before   Current management, blood sugar patterns and problems identified: He did bring his monitor for download today He has high blood sugars only sporadically and has 6 readings in the last month However he is reluctant to consider the freestyle libre CGM even though it was explained this the benefits for patients on insulin He says he is trying to walk even though he is having some knee pain He has only 1 reading midday and that was high at 187 His morning readings are fairly good including 119 in the lab and no high readings at bedtime Not clear how much benefit he is getting from [place] since his creatinine is higher now  Says that his wife has told him to cut back on bread and he is getting larger portions  Side effects from medications: None      Monitors blood glucose:  Once or twice a day.    Glucometer:  One Touch           Blood Glucose readings from   PRE-MEAL Fasting Lunch Dinner Bedtime Overall  Glucose range:   119-183    Mean/median:   150  /150   POST-MEAL PC Breakfast PC Lunch PC Dinner  Glucose range: 168  134-180  Mean/median:      Dietician visit: Most recent: 11/2012     Weight history   Wt Readings from Last 3 Encounters:  05/15/22 227 lb 9.6 oz (103.2 kg)  01/08/22 224 lb (101.6 kg)  12/23/21 220 lb 6.4 oz (100 kg)           Diabetes labs:  Lab Results  Component Value Date   HGBA1C 7.5 (H) 05/06/2022   HGBA1C 7.1 (H) 01/02/2022   HGBA1C 6.9 (H) 11/28/2021   Lab Results  Component Value Date   MICROALBUR 4.5 (H) 04/19/2021   LDLCALC 48 12/23/2021    CREATININE 1.56 (H) 05/06/2022   Other problems discussed today: See review of systems  Allergies as of 05/15/2022       Reactions   Hydrochlorothiazide    Leg and side pain with this   Other    Blood pressure med name unknown.        Medication List        Accurate as of May 15, 2022  3:02 PM. If you have any questions, ask your nurse or doctor.          STOP taking these medications    gabapentin 100 MG capsule Commonly known as: NEURONTIN Stopped by: Elayne Snare, MD       TAKE these medications    amLODipine 10 MG tablet Commonly known as: NORVASC TAKE 1 TABLET BY MOUTH EVERY DAY   aspirin 81 MG tablet Take 81 mg by mouth daily. Reported on 09/05/2015   B-D UF III MINI PEN NEEDLES 31G X 5 MM Misc Generic drug: Insulin Pen Needle USE FOR INSULIN PEN TWICE A DAY   ciprofloxacin 500 MG tablet Commonly known as: CIPRO Take 1 tablet (500 mg total) by mouth 2 (two) times daily.  cyclobenzaprine 5 MG tablet Commonly known as: FLEXERIL Take 1 tablet (5 mg total) by mouth 3 (three) times daily as needed for muscle spasms (start qhs prn due to sedation).   hydrocortisone 20 MG tablet Commonly known as: CORTEF Take 10-20 mg by mouth See admin instructions. Take 20 mg by mouth in the morning and 10 mg in the evening   Insulin Lispro Prot & Lispro (75-25) 100 UNIT/ML Kwikpen Commonly known as: HUMALOG 75/25 MIX INJECT 26 UNITS IN AM AND 8 BEFORE SUPPER   levothyroxine 88 MCG tablet Commonly known as: SYNTHROID Take 1 tablet (88 mcg total) by mouth daily.   metoprolol succinate 25 MG 24 hr tablet Commonly known as: TOPROL-XL TAKE 1 TABLET (25 MG TOTAL) BY MOUTH DAILY.   nitroGLYCERIN 0.4 MG SL tablet Commonly known as: NITROSTAT Place 1 tablet (0.4 mg total) under the tongue every 5 (five) minutes as needed for chest pain.   OneTouch Verio test strip Generic drug: glucose blood USE AS INSTRUCTED TO CHECK BLOOD SUGAR 2x  DAILY. What changed: See  the new instructions. Changed by: Elayne Snare, MD   oxyCODONE 5 MG immediate release tablet Commonly known as: Roxicodone Take 1 tablet (5 mg total) by mouth every 4 (four) hours as needed for severe pain.   Pharmacist Choice Alcohol Pads SMARTSIG:1 Each Topical 4 Times Daily   rosuvastatin 40 MG tablet Commonly known as: CRESTOR TAKE 1 TABLET BY MOUTH EVERY DAY   Xigduo XR 10-998 MG Tb24 Generic drug: Dapagliflozin Pro-metFORMIN ER TAKE 1 TABLET BY MOUTH EVERY DAY        Allergies:  Allergies  Allergen Reactions   Hydrochlorothiazide     Leg and side pain with this   Other     Blood pressure med name unknown.    Past Medical History:  Diagnosis Date   CAD S/P percutaneous coronary angioplasty February 2004; 2008   PCI-dLAD: 2.25 mm x 16 mm Express BMS; Ramus- 2.75 mm x 18 mm Cypher DES (2.8 mm); follow-up 11/'08: Patent LAD/ramus stents. 90% small OM. EF 50-60%.; Myoview 1/'12: Exercise 9 min, 10 METS with no ischemia/infarction.    Clotting disorder (HCC)    Diabetes mellitus    Low-dose metformin   DJD (degenerative joint disease), lumbosacral     status post L4-L5 surgery   Essential hypertension    History of DVT of lower extremity     postoperative   Hyperlipidemia with target LDL less than 70    Hypothyroidism    Synthroid   Panhypopituitarism ([place])    Status post pituitary adenoma removal in 2012   Recurrent deep vein thrombosis (DVT) ([place]) 10/01/2015    Past Surgical History:  Procedure Laterality Date   ARTHROSCOPY KNEE W/ DRILLING Left    BRAIN SURGERY  06/16/2010   Removal of pituitary adenoma   CARDIAC CATHETERIZATION  04/17/2007   Significant LAD tapering but no significant disease the distal stent. Patent Ramus stent. 90% lesion in small OM 2; small nondominant RCA. Medical therapy. EF 50-60%.   CORONARY ANGIOPLASTY WITH STENT PLACEMENT  07/17/2002   PCI-dLAD: 2.25 mm x 16 mm BMS;;PCI- RI: 2.75 mm 18 mm Cypher DES   IR RADIOLOGIST EVAL & MGMT   11/04/2021   IR RADIOLOGIST EVAL & MGMT  12/31/2021   IR RADIOLOGIST EVAL & MGMT  02/27/2022   NM MYOVIEW LTD  06/16/2010   a) Exercise ~9 min - 10 METS; no ischemia or infarction; b) 06/2021: LOW RISK.  Normal LV size and  function.  EF 55 to 60%.  No RWMA.  No ischemia or infarct.  Fixed inferior basal apical defect with normal motion suggestive of artifact.  (Diaphragmatic attenuation)   PROSTATECTOMY N/A 08/13/2015   Procedure: TRANSVESICLE OPEN PROSTATECTOMY** ;  Surgeon: Carolan Clines, MD;  Location: [facility];  Service: Urology;  Laterality: N/A;   RADIOLOGY WITH ANESTHESIA N/A 12/04/2021   Procedure: LEFT RENAL CRYO ABLATION;  Surgeon: Aletta Edouard, MD;  Location: [facility];  Service: Radiology;  Laterality: N/A;   SPINE SURGERY     L4-L5    Family History  Problem Relation Age of Onset   Cancer Mother        pancreatic ca    Social History:  reports that he quit smoking about 28 years ago. His smoking use included cigarettes. He has never used smokeless tobacco. He reports that he does not drink alcohol and does not use drugs.  Review of Systems:  No further recurrence of gout, treated with colchicine in 5/22 Not on allopurinol   Lab Results  Component Value Date   LABURIC 8.0 (H) 01/24/2021     Hypertension:  He is taking 10 mg amlodipine and lisinopril 5 mg, amlodipine prescribed by his cardiologist   BP Readings from Last 3 Encounters:  05/15/22 122/78  01/08/22 124/68  12/23/21 128/74   Renal function history:   Creatinine is relatively higher again  He had a high urine microalbumin in 9/22 with his [facility] but normal here  Lab Results  Component Value Date   CREATININE 1.56 (H) 05/06/2022   CREATININE 1.62 (H) 02/19/2022   CREATININE 1.41 01/02/2022    HYPOPITUITARISM: He has been taking levothyroxine and Cortef as prescribed Takes Cortef 20 mg in the morning and 10 mg in the late afternoon Not clear if he is taking this as the pharmacy data  does not show any refills but he says he is taking it  Hypothyroidism: No recent fatigue Free T4 has been consistently normal with  dosage of 88 mcg levothyroxine  Lab Results  Component Value Date   TSH 2.38 12/23/2021   TSH 4.57 04/19/2021   TSH 4.64 (H) 12/05/2020   FREET4 0.96 01/02/2022   FREET4 1.11 08/20/2021   FREET4 0.97 04/19/2021    Eye exams done periodically at  the [facility]   Hypercholesterolemia: Treated with rosuvastatin by PCP  Lab Results  Component Value Date   CHOL 109 12/23/2021   HDL 41.00 12/23/2021   LDLCALC 48 12/23/2021   LDLDIRECT 85.0 05/18/2014   TRIG 99.0 12/23/2021   CHOLHDL 3 12/23/2021        LABS:  No visits with results within 1 Week(s) from this visit.  Latest known visit with results is:  Lab on 05/06/2022  Component Date Value Ref Range Status   Sodium 05/06/2022 138  135 - 145 mEq/L Final   Potassium 05/06/2022 4.0  3.5 - 5.1 mEq/L Final   Chloride 05/06/2022 108  96 - 112 mEq/L Final   CO2 05/06/2022 23  19 - 32 mEq/L Final   Glucose, Bld 05/06/2022 119 (H)  70 - 99 mg/dL Final   BUN 05/06/2022 23  6 - 23 mg/dL Final   Creatinine, Ser 05/06/2022 1.56 (H)  0.40 - 1.50 mg/dL Final   GFR 05/06/2022 42.30 (L)  >60.00 mL/min Final   Calculated using the CKD-EPI Creatinine Equation (2021)   Calcium 05/06/2022 9.0  8.4 - 10.5 mg/dL Final   Hgb A1c MFr Bld 05/06/2022 7.5 (H)  4.6 - 6.5 % Final   Glycemic Control Guidelines for People with Diabetes:Non Diabetic:  <6%Goal of Therapy: <7%Additional Action Suggested:  >8%      Examination:   BP 122/78   Pulse 84   Ht '5\' 9"'$  (1.753 m)   Wt 227 lb 9.6 oz (103.2 kg)   SpO2 94%   BMI 33.61 kg/m   Body mass index is 33.61 kg/m.   Standing blood pressure 130/75 No edema   ASSESSMENT/ PLAN:    Diabetes type 2 with mild obesity:   See history of present illness for detailed discussion of current diabetes management, blood sugar patterns and problems identified  A1c  is 7.5   He is on premixed insulin twice a day and Xigduo   His blood sugars are difficult to assess because of lack of monitoring but may be getting higher midday Also he thinks he is getting too much carbohydrate with bread Has difficulty losing weight again  To be improving especially with recent weight loss Also likely being consistent with diet No hypoglycemia reported also with majority of his insulin in the morning However unclear how often he is monitoring his sugars as he did not bring his meter  RENAL dysfunction: Slightly better but still abnormal  Hypertension: Blood pressure is normal with no orthostatic drop  History of pituitary tumor: Thyroid levels are quite normal and he will continue 88 mcg levothyroxine Unclear whether he is taking his Cortef but not symptomatic  PLAN: Check blood sugars more consistently after meals Get back on carbohydrates like bread Discussed targets after meals 28 units insulin in the morning He will check to see if he is getting his hydrocortisone otherwise call for prescription  Patient Instructions  Am shot 28 units  Less bread  Make sure you have hydrocortisone     Elayne Snare 05/15/2022, 3:02 PM

## 2022-06-19 ENCOUNTER — Other Ambulatory Visit: Payer: Self-pay | Admitting: Endocrinology

## 2022-06-20 ENCOUNTER — Other Ambulatory Visit: Payer: Self-pay | Admitting: Endocrinology

## 2022-06-20 ENCOUNTER — Other Ambulatory Visit: Payer: Self-pay | Admitting: Family Medicine

## 2022-06-20 DIAGNOSIS — I10 Essential (primary) hypertension: Secondary | ICD-10-CM

## 2022-06-20 DIAGNOSIS — E785 Hyperlipidemia, unspecified: Secondary | ICD-10-CM

## 2022-07-25 ENCOUNTER — Encounter: Payer: Medicare Other | Admitting: Family Medicine

## 2022-07-29 ENCOUNTER — Encounter: Payer: Self-pay | Admitting: Family Medicine

## 2022-07-29 ENCOUNTER — Telehealth: Payer: Self-pay

## 2022-07-29 ENCOUNTER — Ambulatory Visit (INDEPENDENT_AMBULATORY_CARE_PROVIDER_SITE_OTHER): Payer: Medicare Other | Admitting: Family Medicine

## 2022-07-29 VITALS — BP 122/66 | HR 63 | Temp 98.4°F | Ht 69.0 in | Wt 227.6 lb

## 2022-07-29 DIAGNOSIS — R42 Dizziness and giddiness: Secondary | ICD-10-CM

## 2022-07-29 DIAGNOSIS — I2510 Atherosclerotic heart disease of native coronary artery without angina pectoris: Secondary | ICD-10-CM

## 2022-07-29 DIAGNOSIS — E785 Hyperlipidemia, unspecified: Secondary | ICD-10-CM

## 2022-07-29 DIAGNOSIS — Z9861 Coronary angioplasty status: Secondary | ICD-10-CM | POA: Diagnosis not present

## 2022-07-29 DIAGNOSIS — E1165 Type 2 diabetes mellitus with hyperglycemia: Secondary | ICD-10-CM | POA: Diagnosis not present

## 2022-07-29 DIAGNOSIS — G4700 Insomnia, unspecified: Secondary | ICD-10-CM

## 2022-07-29 DIAGNOSIS — I10 Essential (primary) hypertension: Secondary | ICD-10-CM

## 2022-07-29 DIAGNOSIS — E039 Hypothyroidism, unspecified: Secondary | ICD-10-CM

## 2022-07-29 DIAGNOSIS — R0683 Snoring: Secondary | ICD-10-CM

## 2022-07-29 DIAGNOSIS — R400 Somnolence: Secondary | ICD-10-CM

## 2022-07-29 DIAGNOSIS — Z794 Long term (current) use of insulin: Secondary | ICD-10-CM | POA: Diagnosis not present

## 2022-07-29 LAB — LIPID PANEL
Cholesterol: 110 mg/dL (ref 0–200)
HDL: 40.1 mg/dL (ref >39.00)
LDL Cholesterol: 54 mg/dL (ref 0–99)
NonHDL: 69.7
Total CHOL/HDL Ratio: 3
Triglycerides: 79 mg/dL (ref 0.0–149.0)
VLDL: 15.8 mg/dL (ref 0.0–40.0)

## 2022-07-29 LAB — CBC WITH DIFFERENTIAL/PLATELET
Basophils Absolute: 0.1 10*3/uL (ref 0.0–0.1)
Basophils Relative: 1.2 % (ref 0.0–3.0)
Eosinophils Absolute: 0.5 10*3/uL (ref 0.0–0.7)
Eosinophils Relative: 5.4 % — ABNORMAL HIGH (ref 0.0–5.0)
HCT: 39.3 % (ref 39.0–52.0)
Hemoglobin: 12.5 g/dL — ABNORMAL LOW (ref 13.0–17.0)
Lymphocytes Relative: 24.8 % (ref 12.0–46.0)
Lymphs Abs: 2.5 10*3/uL (ref 0.7–4.0)
MCHC: 31.9 g/dL (ref 30.0–36.0)
MCV: 82.8 fl (ref 78.0–100.0)
Monocytes Absolute: 1.3 10*3/uL — ABNORMAL HIGH (ref 0.1–1.0)
Monocytes Relative: 13.3 % — ABNORMAL HIGH (ref 3.0–12.0)
Neutro Abs: 5.6 10*3/uL (ref 1.4–7.7)
Neutrophils Relative %: 55.3 % (ref 43.0–77.0)
Platelets: 385 10*3/uL (ref 150.0–400.0)
RBC: 4.75 Mil/uL (ref 4.22–5.81)
RDW: 16.8 % — ABNORMAL HIGH (ref 11.5–15.5)
WBC: 10 10*3/uL (ref 4.0–10.5)

## 2022-07-29 LAB — COMPREHENSIVE METABOLIC PANEL
ALT: 15 U/L (ref 0–53)
AST: 14 U/L (ref 0–37)
Albumin: 4.1 g/dL (ref 3.5–5.2)
Alkaline Phosphatase: 85 U/L (ref 39–117)
BUN: 18 mg/dL (ref 6–23)
CO2: 26 mEq/L (ref 19–32)
Calcium: 9.4 mg/dL (ref 8.4–10.5)
Chloride: 106 mEq/L (ref 96–112)
Creatinine, Ser: 1.48 mg/dL (ref 0.40–1.50)
GFR: 44.99 mL/min — ABNORMAL LOW (ref >60.00)
Glucose, Bld: 125 mg/dL — ABNORMAL HIGH (ref 70–99)
Potassium: 4.2 mEq/L (ref 3.5–5.1)
Sodium: 140 mEq/L (ref 135–145)
Total Bilirubin: 0.3 mg/dL (ref 0.2–1.2)
Total Protein: 7.1 g/dL (ref 6.0–8.3)

## 2022-07-29 LAB — MICROALBUMIN / CREATININE URINE RATIO
Creatinine,U: 134.2 mg/dL
Microalb Creat Ratio: 4.9 mg/g (ref 0.0–30.0)
Microalb, Ur: 6.6 mg/dL — ABNORMAL HIGH (ref 0.0–1.9)

## 2022-07-29 LAB — TSH: TSH: 4.34 u[IU]/mL (ref 0.35–5.50)

## 2022-07-29 MED ORDER — LEVOTHYROXINE SODIUM 88 MCG PO TABS: 88.0000 ug | ORAL_TABLET | Freq: Every day | ORAL | 1 refills | Status: DC

## 2022-07-29 NOTE — Patient Instructions (Addendum)
It would be unlikely that the Merleen Nicely is causing your symptoms as you have been on that for some time.  I am glad to hear the abdominal symptoms have resolved but if any return abdominal pain or diarrhea follow-up to discuss further.  I will refer you to a sleep specialist as I am concerned that there may be some underlying sleep apnea that could be contributing to some of the difficulty sleeping and fatigue during the day.  I will also check some blood work to look at other causes of the lightheadedness or dizziness.  Make sure to sit on the edge of the bed for about 30 seconds if possible before standing and walking.  If lightheadedness is not improving in the next week or 2, return to discuss further.  We will reschedule physical for next month.  I did check some labs today so that should save you from having to do lab work next time.  Return to the clinic or go to the nearest emergency room if any of your symptoms worsen or new symptoms occur.  Dizziness Dizziness is a common problem. It is a feeling of unsteadiness or light-headedness. You may feel like you are about to faint. Dizziness can lead to injury if you stumble or fall. Anyone can become dizzy, but dizziness is more common in older adults. This condition can be caused by a number of things, including medicines, dehydration, or illness. Follow these instructions at home: Eating and drinking  Drink enough fluid to keep your urine pale yellow. This helps to keep you from becoming dehydrated. Try to drink more clear fluids, such as water. Do not drink alcohol. Limit your caffeine intake if told to do so by your health care provider. Check ingredients and nutrition facts to see if a food or beverage contains caffeine. Limit your salt (sodium) intake if told to do so by your health care provider. Check ingredients and nutrition facts to see if a food or beverage contains sodium. Activity  Avoid making quick movements. Rise slowly from chairs  and steady yourself until you feel okay. In the morning, first sit up on the side of the bed. When you feel okay, stand slowly while you hold onto something until you know that your balance is good. If you need to stand in one place for a long time, move your legs often. Tighten and relax the muscles in your legs while you are standing. Do not drive or use machinery if you feel dizzy. Avoid bending down if you feel dizzy. Place items in your home so that they are easy for you to reach without leaning over. Lifestyle Do not use any products that contain nicotine or tobacco. These products include cigarettes, chewing tobacco, and vaping devices, such as e-cigarettes. If you need help quitting, ask your health care provider. Try to reduce your stress level by using methods such as yoga or meditation. Talk with your health care provider if you need help to manage your stress. General instructions Watch your dizziness for any changes. Take over-the-counter and prescription medicines only as told by your health care provider. Talk with your health care provider if you think that your dizziness is caused by a medicine that you are taking. Tell a friend or a family member that you are feeling dizzy. If he or she notices any changes in your behavior, have this person call your health care provider. Keep all follow-up visits. This is important. Contact a health care provider if: Your dizziness  does not go away or you have new symptoms. Your dizziness or light-headedness gets worse. You feel nauseous. You have reduced hearing. You have a fever. You have neck pain or a stiff neck. Your dizziness leads to an injury or a fall. Get help right away if: You vomit or have diarrhea and are unable to eat or drink anything. You have problems talking, walking, swallowing, or using your arms, hands, or legs. You feel generally weak. You have any bleeding. You are not thinking clearly or you have trouble forming  sentences. It may take a friend or family member to notice this. You have chest pain, abdominal pain, shortness of breath, or sweating. Your vision changes or you develop a severe headache. These symptoms may represent a serious problem that is an emergency. Do not wait to see if the symptoms will go away. Get medical help right away. Call your local emergency services (911 in the [place] Do not drive yourself to the hospital. Summary Dizziness is a feeling of unsteadiness or light-headedness. This condition can be caused by a number of things, including medicines, dehydration, or illness. Anyone can become dizzy, but dizziness is more common in older adults. Drink enough fluid to keep your urine pale yellow. Do not drink alcohol. Avoid making quick movements if you feel dizzy. Monitor your dizziness for any changes. This information is not intended to replace advice given to you by your health care provider. Make sure you discuss any questions you have with your health care provider. Document Revised: 05/07/2020 Document Reviewed: 05/07/2020 Elsevier Patient Education  [employer].

## 2022-07-29 NOTE — Telephone Encounter (Signed)
Pt wife called to update demographic. Requested a call back at [phone].

## 2022-07-29 NOTE — Progress Notes (Unsigned)
Subjective:  Patient ID: Carl Gutierrez, male    DOB: Aug 05, 1945  Age: 77 y.o. MRN: [id number]  CC:  Chief Complaint  Patient presents with   Medication Reaction    Pt reports some side effects of Xigduo and would like to discuss how to proceed with that or if he should change.  Pt is fasting     HPI [facility] presents for  Acute sx's above. Initially scheduled for physical - changed to to acute issues.   Possible medication side effects History of type 2 diabetes, panhypopituitarism, followed by endocrinology.  Chart reviewed, last visit with Dr. Dwyane Dee on 05/15/2022.  He has been on Xigduo, as well as insulin.  Notation on last visit with endocrinology of unsure how much benefit from [facility] given higher creatinine.  1.56 with EGFR 42 on 05/06/2022.  A1c 7.5 at that time.  Last free T4 stable in July 2023.  Continues on Synthroid 88 mcg daily.  Hypertension treated with amlodipine, Toprol.  Hyperlipidemia treated with Crestor.  Presents with medication guide and some possible side effects that he is highlighted including feeling dizzy or lightheaded, weak or tired, unusual sleepiness, stomach pain/nausea/vomiting.however, no recent dose changes and has been on that med for some time.   Notes past few weeks - getting up at night to use bathroom - feels a little lightheaded, and again when lying down - feels overall weak, but temporary.  No focal weakness, facial droop or slurred speech. Not feeling during day.  Sleeping 35mn -1hr and wakes up - no dyspnea. Some sleepiness during day. Naps during day - dozes off. Past year. Frequent night time wakening - 5x/night. Does snore. No prior testing for OSA.  Followed by urology, Dr. WLovena Neighbours Last appt 08/2021. Prostatectomy in 2017. Left renal mass s/p cryoablation last year. Chronic nocturia.  Has appt with urology soon.  No Chest pain/palpitations.  No melena/hematochezia.  Stomach pains 2 weeks ago, thought due to bad food. Some diarrhea at  that time, few days, resolved. No current abdominal sx's.      History Patient Active Problem List   Diagnosis Date Noted   Left renal mass 12/04/2021   Chest pain with low risk for cardiac etiology 09/08/2021   Gout of big toe 10/31/2020   Stage 3a chronic kidney disease ([place] 01/04/2020   Anemia in chronic kidney disease 11/30/2015   Recurrent deep vein thrombosis (DVT) ([place] 10/01/2015   Hematuria, unspecified 10/01/2015   Elevated serum creatinine 10/01/2015   BPH (benign prostatic hyperplasia) 08/13/2015   Benign localized hyperplasia of prostate with urinary obstruction 07/23/2015   Preop cardiovascular exam 07/17/2015   Encounter for CDL (commercial driving license) exam [id number]  Obesity (BMI 30-39.9) 06/26/2013   Essential hypertension 06/26/2013   Panhypopituitarism ([place] 11/17/2011     11/17/2011   Diabetes mellitus ([place] 11/17/2011   Hyperlipidemia associated with type 2 diabetes mellitus ([place] 11/17/2011   CAD S/P percutaneous coronary angioplasty 07/19/2002   Past Medical History:  Diagnosis Date   CAD S/P percutaneous coronary angioplasty February 2004; 2008   PCI-dLAD: 2.25 mm x 16 mm Express BMS; Ramus- 2.75 mm x 18 mm Cypher DES (2.8 mm); follow-up 11/'08: Patent LAD/ramus stents. 90% small OM. EF 50-60%.; Myoview 1/'12: Exercise 9 min, 10 METS with no ischemia/infarction.    Clotting disorder (HCC)    Diabetes mellitus    Low-dose metformin   DJD (degenerative joint disease), lumbosacral     status post L4-L5 surgery   Essential hypertension  History of DVT of lower extremity     postoperative   Hyperlipidemia with target LDL less than 70    Hypothyroidism    Synthroid   Panhypopituitarism ([place])    Status post pituitary adenoma removal in 2012   Recurrent deep vein thrombosis (DVT) ([place]) 10/01/2015   Past Surgical History:  Procedure Laterality Date   ARTHROSCOPY KNEE W/ DRILLING Left    BRAIN SURGERY  06/16/2010   Removal of pituitary adenoma    CARDIAC CATHETERIZATION  04/17/2007   Significant LAD tapering but no significant disease the distal stent. Patent Ramus stent. 90% lesion in small OM 2; small nondominant RCA. Medical therapy. EF 50-60%.   CORONARY ANGIOPLASTY WITH STENT PLACEMENT  07/17/2002   PCI-dLAD: 2.25 mm x 16 mm BMS;;PCI- RI: 2.75 mm 18 mm Cypher DES   IR RADIOLOGIST EVAL & MGMT  11/04/2021   IR RADIOLOGIST EVAL & MGMT  12/31/2021   IR RADIOLOGIST EVAL & MGMT  02/27/2022   NM MYOVIEW LTD  06/16/2010   a) Exercise ~9 min - 10 METS; no ischemia or infarction; b) 06/2021: LOW RISK.  Normal LV size and function.  EF 55 to 60%.  No RWMA.  No ischemia or infarct.  Fixed inferior basal apical defect with normal motion suggestive of artifact.  (Diaphragmatic attenuation)   PROSTATECTOMY N/A 08/13/2015   Procedure: TRANSVESICLE OPEN PROSTATECTOMY** ;  Surgeon: Carolan Clines, MD;  Location: [facility];  Service: Urology;  Laterality: N/A;   RADIOLOGY WITH ANESTHESIA N/A 12/04/2021   Procedure: LEFT RENAL CRYO ABLATION;  Surgeon: Aletta Edouard, MD;  Location: [facility];  Service: Radiology;  Laterality: N/A;   SPINE SURGERY     L4-L5   Allergies  Allergen Reactions   Hydrochlorothiazide     Leg and side pain with this   Other     Blood pressure med name unknown.   Prior to Admission medications   Medication Sig Start Date End Date Taking? Authorizing Provider  Alcohol Swabs (PHARMACIST CHOICE ALCOHOL) PADS SMARTSIG:1 Each Topical 4 Times Daily 07/17/21  Yes [provider]  amLODipine (NORVASC) 10 MG tablet TAKE 1 TABLET BY MOUTH EVERY DAY 06/23/22  Yes Wendie Agreste, MD  aspirin 81 MG tablet Take 81 mg by mouth daily. Reported on 09/05/2015   Yes [provider]  B-D UF III MINI PEN NEEDLES 31G X 5 MM MISC USE FOR INSULIN PEN TWICE A DAY 04/29/21  Yes Elayne Snare, MD  glucose blood (ONETOUCH VERIO) test strip USE AS INSTRUCTED TO CHECK BLOOD SUGAR 2x  DAILY. 05/15/22  Yes Elayne Snare, MD  Insulin Lispro  Prot & Lispro (HUMALOG 75/25 MIX) (75-25) 100 UNIT/ML Kwikpen INJECT 20 UNITS UNDER THE SKIN BEFORE BREAKFAST AND 6 UNITS BEFORE SUPPER. (REPLACES 70/30) 06/23/22  Yes Elayne Snare, MD  levothyroxine (SYNTHROID) 88 MCG tablet Take 1 tablet (88 mcg total) by mouth daily. 12/23/21  Yes Wendie Agreste, MD  metoprolol succinate (TOPROL-XL) 25 MG 24 hr tablet TAKE 1 TABLET (25 MG TOTAL) BY MOUTH DAILY. 06/23/22  Yes Wendie Agreste, MD  rosuvastatin (CRESTOR) 40 MG tablet TAKE 1 TABLET BY MOUTH EVERY DAY 06/23/22  Yes Wendie Agreste, MD  XIGDUO XR 10-998 MG TB24 TAKE 1 TABLET BY MOUTH EVERY DAY 04/18/22  Yes Elayne Snare, MD  nitroGLYCERIN (NITROSTAT) 0.4 MG SL tablet Place 1 tablet (0.4 mg total) under the tongue every 5 (five) minutes as needed for chest pain. 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 Exercises only occasionally. Not as much as he desires 2. 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   He does not smoke cigarettes -- quit in 1995. Does not drink alcohol.   Social Determinants of Health   Financial Resource Strain: Low Risk  (09/26/2021)   Overall Financial Resource Strain (CARDIA)    Difficulty of Paying Living Expenses: Not hard at all  Food Insecurity: No Food Insecurity (09/26/2021)   Hunger Vital  Sign    Worried About Running Out of Food in the Last Year: Never true    Ran Out of Food in the Last Year: Never true  Transportation Needs: No Transportation Needs (09/26/2021)   PRAPARE - Hydrologist (Medical): No    Lack of Transportation (Non-Medical): No  Physical Activity: Inactive (09/26/2021)   Exercise Vital Sign    Days of Exercise per Week: 0 days    Minutes of Exercise per Session: 0 min  Stress: No Stress Concern Present (09/26/2021)   [place]    Feeling of Stress : Not at all  Social Connections: Moderately Integrated (09/26/2021)   Social Connection and Isolation Panel [NHANES]    Frequency of Communication with Friends and Family: Twice a week    Frequency of Social Gatherings with Friends and Family: Twice a week    Attends Religious Services: More than 4 times per year    Active Member of [employer] or Organizations: No    Attends Archivist Meetings: Never    Marital Status: Married  Human resources officer Violence: Not At Risk (09/26/2021)   Humiliation, Afraid, Rape, and Kick questionnaire    Fear of Current or Ex-Partner: No    Emotionally Abused: No    Physically Abused: No    Sexually Abused: No    Review of Systems   Objective:   Vitals:   07/29/22 0801  BP: 122/66  Pulse: 63  Temp: 98.4 F (36.9 C)  TempSrc: Temporal  SpO2: 99%  Weight: 227 lb 9.6 oz (103.2 kg)  Height: 5' 9"$  (1.753 m)  Orthostatic VS for the past 24 hrs (Last 3 readings):  BP- Lying Pulse- Lying BP- Sitting Pulse- Sitting BP- Standing at 0 minutes Pulse- Standing at 0 minutes BP- Standing at 3 minutes Pulse- Standing at 3 minutes  07/29/22 0815 136/80 80 120/72 70 122/70 72 118/68 68      Physical Exam Vitals reviewed.  Constitutional:      General: He is not in acute distress.    Appearance: Normal appearance. He is well-developed. He is not ill-appearing,  toxic-appearing or  diaphoretic.  HENT:     Head: Normocephalic and atraumatic.  Neck:     Vascular: No carotid bruit or JVD.  Cardiovascular:     Rate and Rhythm: Normal rate and regular rhythm.     Heart sounds: Normal heart sounds. No murmur heard.    Comments: Possible rare ectopic beat, overall regular rate and rhythm. Pulmonary:     Effort: Pulmonary effort is normal.     Breath sounds: Normal breath sounds. No rales.  Musculoskeletal:     Right lower leg: No edema.     Left lower leg: No edema.  Skin:    General: Skin is warm and dry.  Neurological:     General: No focal deficit present.     Mental Status: He is alert and oriented to person, place, and time.     GCS: GCS eye subscore is 4. GCS verbal subscore is 5. GCS motor subscore is 6.     Cranial Nerves: No cranial nerve deficit, dysarthria or facial asymmetry.     Motor: Motor function is intact. No weakness or pronator drift.     Coordination: Coordination is intact. Romberg sign negative. Coordination normal. Heel to Shin Test normal.     Gait: Gait is intact.     Comments: Slight antalgic gait favoring left knee, history of left knee issues.  Denies weakness.  Psychiatric:        Mood and Affect: Mood normal.   EKG: Sinus rhythm, rate 65.  PR 252, first-degree block, different from PR 152 on prior EKG in February 2023.  No acute ST or T wave changes or other significant changes compared to that EKG.  January 2023 EKG with PR interval 222, first-degree block seen at that time as well.  Assessment & Plan:  Achilles Rozmus is a 77 y.o. male . Essential hypertension  Hyperlipidemia, unspecified hyperlipidemia type - Plan: Comprehensive metabolic panel, Lipid panel  Hypothyroidism, unspecified type - Plan: TSH, levothyroxine (SYNTHROID) 88 MCG tablet  Type 2 diabetes mellitus with hyperglycemia, with long-term current use of insulin (HCC) - Plan: Urine Microalbumin w/creat. ratio  Daytime sleepiness - Plan: Ambulatory referral to Sleep  Studies  Episodic lightheadedness - Plan: CBC with Differential/Platelet, Comprehensive metabolic panel, EKG [id number]  [place] - Plan: Ambulatory referral to Sleep Studies  Insomnia, unspecified type - Plan: Ambulatory referral to Sleep Studies  CAD S/P percutaneous coronary angioplasty - Plan: EKG 12-Lead   Meds ordered this encounter  Medications   levothyroxine (SYNTHROID) 88 MCG tablet    Sig: Take 1 tablet (88 mcg total) by mouth daily.    Dispense:  90 tablet    Refill:  1   Patient Instructions  It would be unlikely that the Merleen Nicely is causing your symptoms as you have been on that for some time.  I am glad to hear the abdominal symptoms have resolved but if any return abdominal pain or diarrhea follow-up to discuss further.  I will refer you to a sleep specialist as I am concerned that there may be some underlying sleep apnea that could be contributing to some of the difficulty sleeping and fatigue during the day.  I will also check some blood work to look at other causes of the lightheadedness or dizziness.  Make sure to sit on the edge of the bed for about 30 seconds if possible before standing and walking.  If lightheadedness is not improving in the next week or 2, return to discuss further.  We  will reschedule physical for next month.  I did check some labs today so that should save you from having to do lab work next time.  Return to the clinic or go to the nearest emergency room if any of your symptoms worsen or new symptoms occur.  Dizziness Dizziness is a common problem. It is a feeling of unsteadiness or light-headedness. You may feel like you are about to faint. Dizziness can lead to injury if you stumble or fall. Anyone can become dizzy, but dizziness is more common in older adults. This condition can be caused by a number of things, including medicines, dehydration, or illness. Follow these instructions at home: Eating and drinking  Drink enough fluid to keep your urine  pale yellow. This helps to keep you from becoming dehydrated. Try to drink more clear fluids, such as water. Do not drink alcohol. Limit your caffeine intake if told to do so by your health care provider. Check ingredients and nutrition facts to see if a food or beverage contains caffeine. Limit your salt (sodium) intake if told to do so by your health care provider. Check ingredients and nutrition facts to see if a food or beverage contains sodium. Activity  Avoid making quick movements. Rise slowly from chairs and steady yourself until you feel okay. In the morning, first sit up on the side of the bed. When you feel okay, stand slowly while you hold onto something until you know that your balance is good. If you need to stand in one place for a long time, move your legs often. Tighten and relax the muscles in your legs while you are standing. Do not drive or use machinery if you feel dizzy. Avoid bending down if you feel dizzy. Place items in your home so that they are easy for you to reach without leaning over. Lifestyle Do not use any products that contain nicotine or tobacco. These products include cigarettes, chewing tobacco, and vaping devices, such as e-cigarettes. If you need help quitting, ask your health care provider. Try to reduce your stress level by using methods such as yoga or meditation. Talk with your health care provider if you need help to manage your stress. General instructions Watch your dizziness for any changes. Take over-the-counter and prescription medicines only as told by your health care provider. Talk with your health care provider if you think that your dizziness is caused by a medicine that you are taking. Tell a friend or a family member that you are feeling dizzy. If he or she notices any changes in your behavior, have this person call your health care provider. Keep all follow-up visits. This is important. Contact a health care provider if: Your dizziness  does not go away or you have new symptoms. Your dizziness or light-headedness gets worse. You feel nauseous. You have reduced hearing. You have a fever. You have neck pain or a stiff neck. Your dizziness leads to an injury or a fall. Get help right away if: You vomit or have diarrhea and are unable to eat or drink anything. You have problems talking, walking, swallowing, or using your arms, hands, or legs. You feel generally weak. You have any bleeding. You are not thinking clearly or you have trouble forming sentences. It may take a friend or family member to notice this. You have chest pain, abdominal pain, shortness of breath, or sweating. Your vision changes or you develop a severe headache. These symptoms may represent a serious problem that is an emergency.  Do not wait to see if the symptoms will go away. Get medical help right away. Call your local emergency services (911 in the [place] Do not drive yourself to the hospital. Summary Dizziness is a feeling of unsteadiness or light-headedness. This condition can be caused by a number of things, including medicines, dehydration, or illness. Anyone can become dizzy, but dizziness is more common in older adults. Drink enough fluid to keep your urine pale yellow. Do not drink alcohol. Avoid making quick movements if you feel dizzy. Monitor your dizziness for any changes. This information is not intended to replace advice given to you by your health care provider. Make sure you discuss any questions you have with your health care provider. Document Revised: 05/07/2020 Document Reviewed: 05/07/2020 Elsevier Patient Education  2023 [place],   [name], MD [place], [facility] 07/29/22 8:47 AM

## 2022-07-30 NOTE — Telephone Encounter (Signed)
Message left on cell phone voice mail to return call to office.

## 2022-08-06 NOTE — Progress Notes (Unsigned)
Cardiology Clinic Note   Patient Name: Carl Gutierrez Date of Encounter: 08/07/2022  Primary Care Provider:  Wendie Agreste, MD Primary Cardiologist:  Glenetta Hew, MD  Patient Profile    Carl Gutierrez 77 year old male presents the clinic today for follow-up evaluation of his coronary artery disease, hyperlipidemia, and essential hypertension.  Past Medical History    Past Medical History:  Diagnosis Date   CAD S/P percutaneous coronary angioplasty February 2004; 2008   PCI-dLAD: 2.25 mm x 16 mm Express BMS; Ramus- 2.75 mm x 18 mm Cypher DES (2.8 mm); follow-up 11/'08: Patent LAD/ramus stents. 90% small OM. EF 50-60%.; Myoview 1/'12: Exercise 9 min, 10 METS with no ischemia/infarction.    Clotting disorder (HCC)    Diabetes mellitus    Low-dose metformin   DJD (degenerative joint disease), lumbosacral     status post L4-L5 surgery   Essential hypertension    History of DVT of lower extremity     postoperative   Hyperlipidemia with target LDL less than 70    Hypothyroidism    Synthroid   Panhypopituitarism ([place])    Status post pituitary adenoma removal in 2012   Recurrent deep vein thrombosis (DVT) ([place]) 10/01/2015   Past Surgical History:  Procedure Laterality Date   ARTHROSCOPY KNEE W/ DRILLING Left    BRAIN SURGERY  06/16/2010   Removal of pituitary adenoma   CARDIAC CATHETERIZATION  04/17/2007   Significant LAD tapering but no significant disease the distal stent. Patent Ramus stent. 90% lesion in small OM 2; small nondominant RCA. Medical therapy. EF 50-60%.   CORONARY ANGIOPLASTY WITH STENT PLACEMENT  07/17/2002   PCI-dLAD: 2.25 mm x 16 mm BMS;;PCI- RI: 2.75 mm 18 mm Cypher DES   IR RADIOLOGIST EVAL & MGMT  11/04/2021   IR RADIOLOGIST EVAL & MGMT  12/31/2021   IR RADIOLOGIST EVAL & MGMT  02/27/2022   NM MYOVIEW LTD  06/16/2010   a) Exercise ~9 min - 10 METS; no ischemia or infarction; b) 06/2021: LOW RISK.  Normal LV size and function.  EF 55 to 60%.  No RWMA.  No  ischemia or infarct.  Fixed inferior basal apical defect with normal motion suggestive of artifact.  (Diaphragmatic attenuation)   PROSTATECTOMY N/A 08/13/2015   Procedure: TRANSVESICLE OPEN PROSTATECTOMY** ;  Surgeon: Carolan Clines, MD;  Location: [facility];  Service: Urology;  Laterality: N/A;   RADIOLOGY WITH ANESTHESIA N/A 12/04/2021   Procedure: LEFT RENAL CRYO ABLATION;  Surgeon: Aletta Edouard, MD;  Location: [facility];  Service: Radiology;  Laterality: N/A;   SPINE SURGERY     L4-L5    Allergies  Allergies  Allergen Reactions   Hydrochlorothiazide     Leg and side pain with this   Other     Blood pressure med name unknown.    History of Present Illness    Carl Gutierrez has a PMH of coronary artery disease, hyperlipidemia, essential hypertension, and CKD stage IIIa.  Underwent PCI in 2004 and in 2008.  Underwent nuclear stress testing in 2012 and 1/23 for evaluation of atypical chest discomfort which was noted to be nonischemic.  CTA chest abdomen pelvis 07/08/2020 showed no PE and left upper lobe scarring.  He was seen in follow-up by Dr. Ellyn Hack on 08/07/2021.  During that time he reported some discomfort in his left shoulder and upper left chest radiating to his left arm.  His left posterior shoulder was also involved.  He reported that his arm would go numb.  Pain was exacerbated with arm movement.  Pain would come and go and not made worse with increased physical exertion.  He denied dyspnea.  He denied orthopnea PND.  He denied lower extremity edema.  He was limited in his physical activity due to bilateral knee pain.  Follow-up was planned for 1 year.  He presents to the clinic today for follow-up evaluation states he feels well.  He was able to get relief with his shoulder with the help of Dr. Nyoka Cowden.  He reports that he took some anti-inflammatory medication.  His blood pressure is well-controlled at home in the 0000000 systolic range.  He did report some occasional dizziness that  happens with initial standing and dissipates within 5 to 10 seconds.  He is somewhat limited in his physical activity due to left knee pain.  He does walk occasionally.  He reports that he has been down in the [place] area to see the new [employer].  He reports that he visits the area to take care of his parents grave sites.  Initially his blood pressure today is [id number] systolic and on recheck is 122/68.  I will continue his current medication regimen.  He had an EKG earlier in the month at his PCP.  I will request for review.  Will plan follow-up in 12 months.  Today he denies chest pain, shortness of breath, lower extremity edema, fatigue, palpitations, melena, hematuria, hemoptysis, diaphoresis, weakness, presyncope, syncope, orthopnea, and PND.  Home Medications    Prior to Admission medications   Medication Sig Start Date End Date Taking? Authorizing Provider  Alcohol Swabs (PHARMACIST CHOICE ALCOHOL) PADS SMARTSIG:1 Each Topical 4 Times Daily 07/17/21   [provider]  amLODipine (NORVASC) 10 MG tablet TAKE 1 TABLET BY MOUTH EVERY DAY 06/23/22   Wendie Agreste, MD  aspirin 81 MG tablet Take 81 mg by mouth daily. Reported on 09/05/2015    [provider]  B-D UF III MINI PEN NEEDLES 31G X 5 MM MISC USE FOR INSULIN PEN TWICE A DAY 04/29/21   Elayne Snare, MD  glucose blood (ONETOUCH VERIO) test strip USE AS INSTRUCTED TO CHECK BLOOD SUGAR 2x  DAILY. 05/15/22   Elayne Snare, MD  Insulin Lispro Prot & Lispro (HUMALOG 75/25 MIX) (75-25) 100 UNIT/ML Kwikpen INJECT 20 UNITS UNDER THE SKIN BEFORE BREAKFAST AND 6 UNITS BEFORE SUPPER. (REPLACES 70/30) 06/23/22   Elayne Snare, MD  levothyroxine (SYNTHROID) 88 MCG tablet Take 1 tablet (88 mcg total) by mouth daily. 07/29/22   Wendie Agreste, MD  metoprolol succinate (TOPROL-XL) 25 MG 24 hr tablet TAKE 1 TABLET (25 MG TOTAL) BY MOUTH DAILY. 06/23/22   Wendie Agreste, MD  nitroGLYCERIN (NITROSTAT) 0.4 MG SL tablet Place 1 tablet (0.4 mg total)  under the tongue every 5 (five) minutes as needed for chest pain. 07/01/21 11/21/21  [place], Peter M, MD  rosuvastatin (CRESTOR) 40 MG tablet TAKE 1 TABLET BY MOUTH EVERY DAY 06/23/22   Wendie Agreste, MD  XIGDUO XR 10-998 MG TB24 TAKE 1 TABLET BY MOUTH EVERY DAY 04/18/22   Elayne Snare, MD    Family History    Family History  Problem Relation Age of Onset   Cancer Mother        pancreatic ca   He indicated that his mother is deceased. He indicated that his father is deceased.  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 Exercises only occasionally. Not as much as he desires 2. 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   He does not smoke cigarettes -- quit in 1995. Does not drink alcohol.   Social Determinants of Health   Financial Resource Strain: Low Risk  (09/26/2021)   Overall Financial Resource Strain (CARDIA)    Difficulty of Paying Living Expenses: Not hard at all  Food Insecurity: No Food Insecurity (09/26/2021)   Hunger Vital Sign    Worried About Running Out of Food in the Last Year: Never true    Ran Out of Food in the Last Year: Never true  Transportation Needs: No Transportation Needs (09/26/2021)   PRAPARE - Hydrologist (Medical): No    Lack of Transportation (Non-Medical): No   Physical Activity: Inactive (09/26/2021)   Exercise Vital Sign    Days of Exercise per Week: 0 days    Minutes of Exercise per Session: 0 min  Stress: No Stress Concern Present (09/26/2021)   [place]    Feeling of Stress : Not at all  Social Connections: Moderately Integrated (09/26/2021)   Social Connection and Isolation Panel [NHANES]    Frequency of Communication with Friends and Family: Twice a week    Frequency of Social Gatherings with Friends and Family: Twice a week    Attends Religious Services: More than 4 times per year    Active Member of [employer] or Organizations: No    Attends Archivist Meetings: Never    Marital Status: Married  Human resources officer Violence: Not At Risk (09/26/2021)   Humiliation, Afraid, Rape, and Kick questionnaire    Fear of Current or Ex-Partner: No    Emotionally Abused: No    Physically Abused: No    Sexually Abused: No     Review of Systems    General:  No chills, fever, night sweats or weight changes.  Cardiovascular:  No chest pain, dyspnea on exertion, edema, orthopnea, palpitations, paroxysmal nocturnal dyspnea. Dermatological: No rash, lesions/masses Respiratory: No cough, dyspnea Urologic: No hematuria, dysuria Abdominal:   No nausea, vomiting, diarrhea, bright red blood per rectum, melena, or hematemesis Neurologic:  No visual changes, wkns, changes in mental status. All other systems reviewed and are otherwise negative except as noted above.  Physical Exam    VS:  BP 122/68   Pulse 68   Ht 5' 10"$  (1.778 m)   Wt 224 lb (101.6 kg)   BMI 32.14 kg/m  , BMI Body mass index is 32.14 kg/m. GEN: Well nourished, well developed, in no acute distress. HEENT: normal. Neck: Supple, no JVD, carotid bruits, or masses. Cardiac: RRR, no murmurs, rubs, or gallops.  No clubbing, cyanosis, edema.  Radials/DP/PT 2+ and equal bilaterally.  Respiratory:  Respirations regular  and unlabored, clear to auscultation bilaterally. GI: Soft, nontender, nondistended, BS + x 4. MS: no deformity or atrophy. Skin: warm and dry, no rash. Neuro:  Strength and sensation are intact. Psych: Normal affect.  Accessory Clinical Findings    Recent Labs: 07/29/2022: ALT 15; BUN 18; Creatinine, Ser 1.48; Hemoglobin 12.5; Platelets 385.0; Potassium 4.2; Sodium 140; TSH 4.34   Recent Lipid Panel    Component Value Date/Time   CHOL 110 07/29/2022 0921   CHOL 130 05/28/2020 0931   TRIG 79.0 07/29/2022 0921   HDL 40.10 07/29/2022 0921   HDL 40 05/28/2020 0931   CHOLHDL 3 07/29/2022 0921   VLDL 15.8 07/29/2022 0921   LDLCALC 54 07/29/2022 0921   LDLCALC 66 05/28/2020 0931   LDLDIRECT 85.0 05/18/2014 1523         ECG personally reviewed by me today-none today.  Will request recent EKG from PCP for review.  Cardiac catheterization 1119 8  Nuclear stress test 07/03/2021    No ST deviation was noted.   LV perfusion is abnormal. There is no evidence of ischemia. There is no evidence of infarction. Defect 1: There is a small defect with mild reduction in uptake present in the apical to basal inferior location(s) that is fixed. There is normal wall motion in the defect area. Consistent with artifact caused by diaphragmatic attenuation.   Left ventricular function is normal. End diastolic cavity size is normal. End systolic cavity size is normal.   The study is normal. The study is low risk.  Assessment & Plan   1.  Coronary artery disease-no chest pain today.  Underwent nuclear stress test 1/23 which showed no ischemia.  Low risk.  EF was noted to be 55-60%. Continue amlodipine, aspirin, metoprolol, nitroglycerin as needed, rosuvastatin Heart healthy low-sodium diet-salty 6 given Increase physical activity as tolerated  Hyperlipidemia- 07/29/2022: Cholesterol 110; HDL 40.10; LDL Cholesterol 54; Triglycerides 79.0; VLDL 15.8 Continue rosuvastatin, aspirin Heart healthy  low-sodium high-fiber diet Increase physical activity as tolerated  Essential hypertension-BP today 122/68 Continue amlodipine, metoprolol Increase physical activity as tolerated  Stage III CKD-creatinine 1.48 on 07/29/2022 Follows with PCP  Disposition: Follow-up with Dr. Ellyn Hack or me in 12 months.   Jossie Ng. Reed Eifert NP-C     08/07/2022, 11:02 AM [place] [address] Office [phone] Fax [phone]    I spent 14 minutes examining this patient, reviewing medications, and using patient centered shared decision making involving her cardiac care.  Prior to her visit I spent greater than 20 minutes reviewing her past medical history,  medications, and prior cardiac tests.

## 2022-08-07 ENCOUNTER — Telehealth: Payer: Self-pay | Admitting: Family Medicine

## 2022-08-07 ENCOUNTER — Ambulatory Visit: Payer: Medicare Other | Attending: General Practice | Admitting: General Practice

## 2022-08-07 ENCOUNTER — Encounter: Payer: Self-pay | Admitting: General Practice

## 2022-08-07 VITALS — BP 122/68 | HR 68 | Ht 70.0 in | Wt 224.0 lb

## 2022-08-07 DIAGNOSIS — E785 Hyperlipidemia, unspecified: Secondary | ICD-10-CM

## 2022-08-07 DIAGNOSIS — I10 Essential (primary) hypertension: Secondary | ICD-10-CM

## 2022-08-07 DIAGNOSIS — E1169 Type 2 diabetes mellitus with other specified complication: Secondary | ICD-10-CM | POA: Diagnosis not present

## 2022-08-07 DIAGNOSIS — I25118 Atherosclerotic heart disease of native coronary artery with other forms of angina pectoris: Secondary | ICD-10-CM

## 2022-08-07 DIAGNOSIS — N1831 Chronic kidney disease, stage 3a: Secondary | ICD-10-CM | POA: Diagnosis not present

## 2022-08-07 NOTE — Telephone Encounter (Signed)
Faxed copy to heart care and placed in file to be sent to scan

## 2022-08-07 NOTE — Telephone Encounter (Signed)
Caller name: [place]?: Yes  Call back number: [phone] F# [phone]  Provider they see: Wendie Agreste, MD  Reason for call:Looking for EKG 08/06/22

## 2022-08-07 NOTE — Patient Instructions (Signed)
Medication Instructions:  The current medical regimen is effective;  continue present plan and medications as directed. Please refer to the Current Medication list given to you today.  *If you need a refill on your cardiac medications before your next appointment, please call your pharmacy*  Lab Work: NONE If you have labs (blood work) drawn today and your tests are completely normal, you will receive your results only by: [place] (if you have MyChart) OR  A paper copy in the mail If you have any lab test that is abnormal or we need to change your treatment, we will call you to review the results.  Testing/Procedures: NONE   Follow-Up: At [facility], you and your health needs are our priority.  As part of our continuing mission to provide you with exceptional heart care, we have created designated Provider Care Teams.  These Care Teams include your primary Cardiologist (physician) and Advanced Practice Providers (APPs -  Physician Assistants and Nurse Practitioners) who all work together to provide you with the care you need, when you need it.  We recommend signing up for the patient portal called "MyChart".  Sign up information is provided on this After Visit Summary.  MyChart is used to connect with patients for Virtual Visits (Telemedicine).  Patients are able to view lab/test results, encounter notes, upcoming appointments, etc.  Non-urgent messages can be sent to your provider as well.   To learn more about what you can do with MyChart, go to [email or web address].    Your next appointment:   12 month(s)  Provider:   Glenetta Hew, MD    Other Instructions INCREASE PHYSICAL ACTIVITY AS TOLERATED

## 2022-08-21 ENCOUNTER — Other Ambulatory Visit: Payer: Self-pay | Admitting: Endocrinology

## 2022-08-27 ENCOUNTER — Encounter: Payer: Self-pay | Admitting: Neurology

## 2022-08-27 ENCOUNTER — Ambulatory Visit: Payer: Medicare Other | Admitting: Neurology

## 2022-08-27 VITALS — BP 112/66 | HR 83 | Ht 69.0 in | Wt 225.6 lb

## 2022-08-27 DIAGNOSIS — R0681 Apnea, not elsewhere classified: Secondary | ICD-10-CM

## 2022-08-27 DIAGNOSIS — I2510 Atherosclerotic heart disease of native coronary artery without angina pectoris: Secondary | ICD-10-CM

## 2022-08-27 DIAGNOSIS — G4719 Other hypersomnia: Secondary | ICD-10-CM

## 2022-08-27 DIAGNOSIS — Z9189 Other specified personal risk factors, not elsewhere classified: Secondary | ICD-10-CM | POA: Diagnosis not present

## 2022-08-27 DIAGNOSIS — E66811 Obesity, class 1: Secondary | ICD-10-CM

## 2022-08-27 DIAGNOSIS — E669 Obesity, unspecified: Secondary | ICD-10-CM | POA: Diagnosis not present

## 2022-08-27 DIAGNOSIS — R0683 Snoring: Secondary | ICD-10-CM

## 2022-08-27 DIAGNOSIS — R351 Nocturia: Secondary | ICD-10-CM

## 2022-08-27 DIAGNOSIS — Z9861 Coronary angioplasty status: Secondary | ICD-10-CM

## 2022-08-27 NOTE — Patient Instructions (Signed)

## 2022-08-27 NOTE — Progress Notes (Signed)
Subjective:    Patient ID: Carl Gutierrez is a 77 y.o. male.  HPI    Star Age, MD, PhD [facility] [address] [address] [place], [place] [address]  Dear Dr. Carlota Raspberry,  I saw your patient, Tag Woitas, upon your kind request in my sleep clinic today for initial consultation of his sleep disorder, in particular, concern for underlying obstructive sleep apnea.  The patient is accompanied by his wife today.  As you know, Mr. Vanderzwaag is a 77 year old male with an underlying complex medical history of hyperlipidemia, hypertension, type 2 diabetes, panhypopituitarism, gout, chronic kidney disease, anemia, coronary artery disease with status post angioplasty, and obesity, who reports snoring and excessive daytime somnolence as well as frequent nocturia.  His Epworth sleepiness score is 5 out of 24, fatigue severity score is 36 out of 63.  He does not take any actual scheduled naps but wife reports that he does have a tendency to doze off when sedentary such as watching TV.  He goes to bed generally between 8 and 9 but does not go to sleep until later, may be closer to 11 or 11:30 PM.  He watches TV while in bed, wife turns it off at night.  He does have nocturia about 5 or 6 times per average night.  His wife reports that he does have pauses in his breathing while asleep and sometimes she has to check on him.  She does not believe he wakes up fully rested and feels that he is underestimating his daytime somnolence.  I reviewed your office note from 07/29/2022.  He drinks no daily caffeine, he does not drink any alcohol currently.  He quit smoking over 10 years ago.  His weight has been more or less stable.  He lives with his wife, they have 2 grown children, no pets in the household.  He denies recurrent nocturnal or morning headaches.  His Past Medical History Is Significant For: Past Medical History:  Diagnosis Date   CAD S/P percutaneous coronary angioplasty February  2004; 2008   PCI-dLAD: 2.25 mm x 16 mm Express BMS; Ramus- 2.75 mm x 18 mm Cypher DES (2.8 mm); follow-up 11/'08: Patent LAD/ramus stents. 90% small OM. EF 50-60%.; Myoview 1/'12: Exercise 9 min, 10 METS with no ischemia/infarction.    Clotting disorder (HCC)    Diabetes mellitus    Low-dose metformin   DJD (degenerative joint disease), lumbosacral     status post L4-L5 surgery   Essential hypertension    History of DVT of lower extremity     postoperative   Hyperlipidemia with target LDL less than 70    Hypothyroidism    Synthroid   Panhypopituitarism ([place])    Status post pituitary adenoma removal in 2012   Recurrent deep vein thrombosis (DVT) ([place]) 10/01/2015    His Past Surgical History Is Significant For: Past Surgical History:  Procedure Laterality Date   ARTHROSCOPY KNEE W/ DRILLING Left    BRAIN SURGERY  06/16/2010   Removal of pituitary adenoma   CARDIAC CATHETERIZATION  04/17/2007   Significant LAD tapering but no significant disease the distal stent. Patent Ramus stent. 90% lesion in small OM 2; small nondominant RCA. Medical therapy. EF 50-60%.   CORONARY ANGIOPLASTY WITH STENT PLACEMENT  07/17/2002   PCI-dLAD: 2.25 mm x 16 mm BMS;;PCI- RI: 2.75 mm 18 mm Cypher DES   IR RADIOLOGIST EVAL & MGMT  11/04/2021   IR RADIOLOGIST EVAL & MGMT  12/31/2021   IR  RADIOLOGIST EVAL & MGMT  02/27/2022   NM MYOVIEW LTD  06/16/2010   a) Exercise ~9 min - 10 METS; no ischemia or infarction; b) 06/2021: LOW RISK.  Normal LV size and function.  EF 55 to 60%.  No RWMA.  No ischemia or infarct.  Fixed inferior basal apical defect with normal motion suggestive of artifact.  (Diaphragmatic attenuation)   PROSTATECTOMY N/A 08/13/2015   Procedure: TRANSVESICLE OPEN PROSTATECTOMY** ;  Surgeon: Carolan Clines, MD;  Location: [facility];  Service: Urology;  Laterality: N/A;   RADIOLOGY WITH ANESTHESIA N/A 12/04/2021   Procedure: LEFT RENAL CRYO ABLATION;  Surgeon: Aletta Edouard, MD;  Location: [facility];   Service: Radiology;  Laterality: N/A;   SPINE SURGERY     L4-L5    His Family History Is Significant For: Family History  Problem Relation Age of Onset   Cancer Mother        pancreatic ca   Sleep apnea Neg Hx     His Social History Is Significant For: Social History   Socioeconomic History   Marital status: Married    Spouse name: Not on file   Number of children: Not on file   Years of education: Not on file   Highest education level: Not on file  Occupational History   Not on file  Tobacco Use   Smoking status: Former    Types: Cigarettes    Quit date: 06/24/1993    Years since quitting: 29.1   Smokeless tobacco: Never  Vaping Use   Vaping Use: Never used  Substance and Sexual Activity   Alcohol use: No   Drug use: No   Sexual activity: Not Currently    Comment: Local truck driver, 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 Exercises only occasionally. Not as much as he desires 2. 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   He does not smoke cigarettes -- quit in 1995. Does not drink alcohol.   Social Determinants of Health   Financial Resource Strain: Low Risk  (09/26/2021)   Overall Financial Resource Strain (CARDIA)    Difficulty of Paying Living Expenses: Not hard at all  Food Insecurity: No Food Insecurity (09/26/2021)   Hunger Vital Sign    Worried About Running Out of Food in the Last Year: Never true    Ran Out of Food in the Last Year: Never true  Transportation Needs: No Transportation Needs (09/26/2021)   PRAPARE - Hydrologist (Medical): No    Lack of Transportation (Non-Medical): No  Physical Activity: Inactive (09/26/2021)   Exercise Vital Sign    Days of Exercise per Week: 0 days    Minutes of Exercise per Session: 0 min  Stress: No Stress Concern Present (09/26/2021)   [place]    Feeling of Stress : Not at all  Social Connections: Moderately Integrated (09/26/2021)   Social Connection and Isolation Panel [NHANES]    Frequency of Communication with Friends and Family: Twice a week    Frequency of Social Gatherings with Friends and Family: Twice a week    Attends Religious Services: More than 4 times per year    Active Member of [employer] or Organizations: No    Attends Archivist Meetings: Never    Marital Status: Married  His Allergies Are:  Allergies  Allergen Reactions   Hydrochlorothiazide     Leg and side pain with this   Other     Blood pressure med name unknown.  :   His Current Medications Are:  Outpatient Encounter Medications as of 08/27/2022  Medication Sig   Alcohol Swabs (PHARMACIST CHOICE ALCOHOL) PADS SMARTSIG:1 Each Topical 4 Times Daily   amLODipine (NORVASC) 10 MG tablet TAKE 1 TABLET BY MOUTH EVERY DAY   aspirin 81 MG tablet Take 81 mg by mouth daily. Reported on 09/05/2015   B-D UF III MINI PEN NEEDLES 31G X 5 MM MISC USE FOR INSULIN PEN TWICE A DAY   glucose blood (ONETOUCH VERIO) test strip USE AS INSTRUCTED TO CHECK BLOOD SUGAR 2x  DAILY.   Insulin Lispro Prot & Lispro (HUMALOG 75/25 MIX) (75-25) 100 UNIT/ML Kwikpen INJECT 20 UNITS UNDER THE SKIN BEFORE BREAKFAST AND 6 UNITS BEFORE SUPPER. (REPLACES 70/30)   levothyroxine (SYNTHROID) 88 MCG tablet Take 1 tablet (88 mcg total) by mouth daily.   metoprolol succinate (TOPROL-XL) 25 MG 24 hr tablet TAKE 1 TABLET (25 MG TOTAL) BY MOUTH DAILY.   rosuvastatin (CRESTOR) 40 MG tablet TAKE 1 TABLET BY MOUTH EVERY DAY   XIGDUO XR 10-998 MG TB24 TAKE 1 TABLET BY MOUTH EVERY DAY   nitroGLYCERIN (NITROSTAT) 0.4 MG SL tablet Place 1 tablet (0.4 mg total) under the tongue every 5 (five) minutes as needed for chest pain.   No facility-administered encounter medications on file as of 08/27/2022.  :   Review of Systems:  Out of a complete 14 point review of  systems, all are reviewed and negative with the exception of these symptoms as listed below:   Review of Systems  Neurological:        Pt here for sleep consult Pt snores,fatigue,hypertension Pt denies headaches ,sleep study,CPAP machine    ESS:5 FSS:36    Objective:  Neurological Exam  Physical Exam Physical Examination:   Vitals:   08/27/22 1107  BP: 112/66  Pulse: 83    General Examination: The patient is a very pleasant 77 y.o. male in no acute distress. He appears well-developed and well-nourished and well groomed.   HEENT: Normocephalic, atraumatic, pupils are equal, round and reactive to light, extraocular tracking is good without limitation to gaze excursion or nystagmus noted. Hearing is grossly intact. Face is symmetric with normal facial animation. Speech is clear with no dysarthria noted. There is no hypophonia. There is no lip, neck/head, jaw or voice tremor. Neck is supple with full range of passive and active motion. There are no carotid bruits on auscultation. Oropharynx exam reveals: moderate mouth dryness, adequate dental hygiene and moderate airway crowding, due to longer uvula, Mallampati class III, longer tongue.  Tonsils on the smaller side.  Neck circumference 17-1/2 inches.  Mild overbite noted.  Tongue protrudes centrally and palate elevates symmetrically.  Chest: Clear to auscultation without wheezing, rhonchi or crackles noted.  Heart: S1+S2+0, regular and normal without murmurs, rubs or gallops noted.   Abdomen: Soft, non-tender and non-distended.  Extremities: There is no pitting edema in the distal lower extremities bilaterally.   Skin: Warm and dry without trophic changes noted.   Musculoskeletal: exam reveals no obvious joint deformities.   Neurologically:  Mental status: The patient is awake, alert and oriented in all 4 spheres. His immediate and remote memory, attention, language skills and fund of knowledge are appropriate. There is no  evidence of aphasia, agnosia, apraxia or anomia. Speech  is clear with normal prosody and enunciation. Thought process is linear. Mood is normal and affect is normal.  Cranial nerves II - XII are as described above under HEENT exam.  Motor exam: Normal bulk, strength and tone is noted. There is no obvious action or resting tremor.  Fine motor skills and coordination: grossly intact.  Cerebellar testing: No dysmetria or intention tremor. There is no truncal or gait ataxia.  Sensory exam: intact to light touch in the upper and lower extremities.  Gait, station and balance: He stands easily. No veering to one side is noted. No leaning to one side is noted. Posture is age-appropriate and stance is narrow based. Gait shows normal stride length and normal pace. No problems turning are noted.   Assessment and Plan:  In summary, Burdell Pomper is a very pleasant 77 y.o.-year old male with an underlying complex medical history of hyperlipidemia, hypertension, type 2 diabetes, panhypopituitarism, gout, chronic kidney disease, anemia, coronary artery disease with status post angioplasty, and obesity, whose history and physical exam are concerning for sleep disordered breathing, supporting a current working diagnosis of unspecified sleep apnea, with the main differential diagnoses of obstructive sleep apnea (OSA) versus upper airway resistance syndrome (UARS) versus central sleep apnea (CSA), or mixed sleep apnea. A laboratory attended sleep study is typically considered "gold standard" for evaluation of sleep disordered breathing.   I had a long chat with the patient and his wife about my findings and the diagnosis of sleep apnea, particularly OSA, its prognosis and treatment options. We talked about medical/conservative treatments, surgical interventions and non-pharmacological approaches for symptom control. I explained, in particular, the risks and ramifications of untreated moderate to severe OSA, especially with  respect to developing cardiovascular disease down the road, including congestive heart failure (CHF), difficult to treat hypertension, cardiac arrhythmias (particularly A-fib), neurovascular complications including TIA, stroke and dementia. Even type 2 diabetes has, in part, been linked to untreated OSA. Symptoms of untreated OSA may include (but may not be limited to) daytime sleepiness, nocturia (i.e. frequent nighttime urination), memory problems, mood irritability and suboptimally controlled or worsening mood disorder such as depression and/or anxiety, lack of energy, lack of motivation, physical discomfort, as well as recurrent headaches, especially morning or nocturnal headaches. We talked about the importance of maintaining a healthy lifestyle and striving for healthy weight. In addition, we talked about the importance of striving for and maintaining good sleep hygiene. I recommended a sleep study at this time. I outlined the differences between a laboratory attended sleep study which is considered more comprehensive and accurate over the option of a home sleep test (HST); the latter may lead to underestimation of sleep disordered breathing in some instances and does not help with diagnosing upper airway resistance syndrome and is not accurate enough to diagnose primary central sleep apnea typically. I outlined possible surgical and non-surgical treatment options of OSA, including the use of a positive airway pressure (PAP) device (i.e. CPAP, AutoPAP/APAP or BiPAP in certain circumstances), a custom-made dental device (aka oral appliance, which would require a referral to a specialist dentist or orthodontist typically, and is generally speaking not considered for patients with full dentures or edentulous state), upper airway surgical options, such as traditional UPPP (which is not considered a first-line treatment) or the Inspire device (hypoglossal nerve stimulator, which would involve a referral for  consultation with an ENT surgeon, after careful selection, following inclusion criteria - also not first-line treatment). I explained the PAP treatment option to the patient  in detail, as this is generally considered first-line treatment.  The patient indicated that he would be reluctant but willing to try PAP therapy, if the need arises. I explained the importance of being compliant with PAP treatment, not only for insurance purposes but primarily to improve patient's symptoms symptoms, and for the patient's long term health benefit, including to reduce His cardiovascular risks longer-term.    We will pick up our discussion about the next steps and treatment options after testing.  We will keep him posted as to the test results by phone call and/or MyChart messaging where possible.  We will plan to follow-up in sleep clinic accordingly as well.  I answered all their questions today and the patient and his wife were in agreement.   I encouraged him to call with any interim questions, concerns, problems or updates or email [place] through [place].  Generally speaking, sleep test authorizations may take up to 2 weeks, sometimes less, sometimes longer, the patient is encouraged to get in touch with [place] if they do not hear back from the sleep lab staff directly within the next 2 weeks.  Thank you very much for allowing me to participate in the care of this nice patient. If I can be of any further assistance to you please do not hesitate to call me at [phone].  Sincerely,   Star Age, MD, PhD

## 2022-08-28 ENCOUNTER — Other Ambulatory Visit: Payer: Self-pay

## 2022-09-11 ENCOUNTER — Other Ambulatory Visit (INDEPENDENT_AMBULATORY_CARE_PROVIDER_SITE_OTHER): Payer: Medicare Other

## 2022-09-11 DIAGNOSIS — E1165 Type 2 diabetes mellitus with hyperglycemia: Secondary | ICD-10-CM | POA: Diagnosis not present

## 2022-09-11 DIAGNOSIS — E230 Hypopituitarism: Secondary | ICD-10-CM | POA: Diagnosis not present

## 2022-09-11 DIAGNOSIS — Z794 Long term (current) use of insulin: Secondary | ICD-10-CM

## 2022-09-11 LAB — MICROALBUMIN / CREATININE URINE RATIO
Creatinine,U: 83.7 mg/dL
Microalb Creat Ratio: 8.6 mg/g (ref 0.0–30.0)
Microalb, Ur: 7.2 mg/dL — ABNORMAL HIGH (ref 0.0–1.9)

## 2022-09-11 LAB — T4, FREE: Free T4: 0.93 ng/dL (ref 0.60–1.60)

## 2022-09-11 LAB — COMPREHENSIVE METABOLIC PANEL
ALT: 11 U/L (ref 0–53)
AST: 12 U/L (ref 0–37)
Albumin: 4.1 g/dL (ref 3.5–5.2)
Alkaline Phosphatase: 72 U/L (ref 39–117)
BUN: 24 mg/dL — ABNORMAL HIGH (ref 6–23)
CO2: 23 mEq/L (ref 19–32)
Calcium: 9.4 mg/dL (ref 8.4–10.5)
Chloride: 109 mEq/L (ref 96–112)
Creatinine, Ser: 1.8 mg/dL — ABNORMAL HIGH (ref 0.40–1.50)
GFR: 35.54 mL/min — ABNORMAL LOW (ref >60.00)
Glucose, Bld: 99 mg/dL (ref 70–99)
Potassium: 4 mEq/L (ref 3.5–5.1)
Sodium: 139 mEq/L (ref 135–145)
Total Bilirubin: 0.3 mg/dL (ref 0.2–1.2)
Total Protein: 7.1 g/dL (ref 6.0–8.3)

## 2022-09-11 LAB — HEMOGLOBIN A1C: Hgb A1c MFr Bld: 7.2 % — ABNORMAL HIGH (ref 4.6–6.5)

## 2022-09-12 ENCOUNTER — Other Ambulatory Visit: Payer: Medicare Other

## 2022-09-15 NOTE — Progress Notes (Unsigned)
Patient ID: Abrian Saiki, male   DOB: [birth year], 77 y.o.   MRN: [id number]           Reason for Appointment: Endocrinology follow-up  History of Present Illness   DIABETES type II:  Recent history:     INSULIN regimen: Humalog 75/25 insulin, 28 units before breakfast and 8 before dinner  Non-insulin hypoglycemic drugs: Xigduo 10/998 daily  His A1c is most recently 7.2, was 7.5   Current management, blood sugar patterns and problems identified: He did not bring his monitor for download today His morning insulin was increased to 28 on the last visit because of high readings in the afternoon mostly However unclear how often he is checking his blood sugars and what they are at different times Although he thinks at times he has readings as low as 76 fasting he has no overnight hypoglycemic symptoms Also blood sugars after dinner about 160 by recall Weight is about the same He does a little walking, limited by knee pain Also trying to cut back on starchy foods like bread Previously had refused the freestyle libre sensor  Side effects from medications: None      Monitors blood glucose:  Once or twice a day.    Glucometer:  One Touch           Blood Glucose readings from recall   PRE-MEAL Fasting Lunch Dinner Bedtime Overall  Glucose range:  76-120  150-+    Mean/median:        POST-MEAL PC Breakfast PC Lunch PC Dinner  Glucose range:   160  Mean/median:      Previously from download:  PRE-MEAL Fasting Lunch Dinner Bedtime Overall  Glucose range:   119-183    Mean/median:   150  /150   POST-MEAL PC Breakfast PC Lunch PC Dinner  Glucose range: 168  134-180  Mean/median:      Dietician visit: Most recent: 11/2012     Weight history   Wt Readings from Last 3 Encounters:  09/16/22 224 lb (101.6 kg)  08/27/22 225 lb 9.6 oz (102.3 kg)  08/07/22 224 lb (101.6 kg)           Diabetes labs:  Lab Results  Component Value Date   HGBA1C 7.2 (H) 09/11/2022   HGBA1C  7.5 (H) 05/06/2022   HGBA1C 7.1 (H) 01/02/2022   Lab Results  Component Value Date   MICROALBUR 7.2 (H) 09/11/2022   LDLCALC 54 07/29/2022   CREATININE 1.80 (H) 09/11/2022   Other problems discussed today: See review of systems  Allergies as of 09/16/2022       Reactions   Hydrochlorothiazide    Leg and side pain with this   Other    Blood pressure med name unknown.        Medication List        Accurate as of September 16, 2022  8:20 AM. If you have any questions, ask your nurse or doctor.          amLODipine 10 MG tablet Commonly known as: NORVASC TAKE 1 TABLET BY MOUTH EVERY DAY   aspirin 81 MG tablet Take 81 mg by mouth daily. Reported on 09/05/2015   B-D UF III MINI PEN NEEDLES 31G X 5 MM Misc Generic drug: Insulin Pen Needle USE FOR INSULIN PEN TWICE A DAY   Insulin Lispro Prot & Lispro (75-25) 100 UNIT/ML Kwikpen Commonly known as: HUMALOG 75/25 MIX INJECT 20 UNITS UNDER THE SKIN BEFORE BREAKFAST AND 6 UNITS  BEFORE SUPPER. (REPLACES 70/30) What changed: additional instructions   levothyroxine 88 MCG tablet Commonly known as: SYNTHROID Take 1 tablet (88 mcg total) by mouth daily.   metoprolol succinate 25 MG 24 hr tablet Commonly known as: TOPROL-XL TAKE 1 TABLET (25 MG TOTAL) BY MOUTH DAILY.   nitroGLYCERIN 0.4 MG SL tablet Commonly known as: NITROSTAT Place 1 tablet (0.4 mg total) under the tongue every 5 (five) minutes as needed for chest pain.   OneTouch Verio test strip Generic drug: glucose blood USE AS INSTRUCTED TO CHECK BLOOD SUGAR 2x  DAILY.   Pharmacist Choice Alcohol Pads SMARTSIG:1 Each Topical 4 Times Daily   rosuvastatin 40 MG tablet Commonly known as: CRESTOR TAKE 1 TABLET BY MOUTH EVERY DAY   Xigduo XR 10-998 MG Tb24 Generic drug: Dapagliflozin Pro-metFORMIN ER TAKE 1 TABLET BY MOUTH EVERY DAY        Allergies:  Allergies  Allergen Reactions   Hydrochlorothiazide     Leg and side pain with this   Other     Blood  pressure med name unknown.    Past Medical History:  Diagnosis Date   CAD S/P percutaneous coronary angioplasty February 2004; 2008   PCI-dLAD: 2.25 mm x 16 mm Express BMS; Ramus- 2.75 mm x 18 mm Cypher DES (2.8 mm); follow-up 11/'08: Patent LAD/ramus stents. 90% small OM. EF 50-60%.; Myoview 1/'12: Exercise 9 min, 10 METS with no ischemia/infarction.    Clotting disorder    Diabetes mellitus    Low-dose metformin   DJD (degenerative joint disease), lumbosacral     status post L4-L5 surgery   Essential hypertension    History of DVT of lower extremity     postoperative   Hyperlipidemia with target LDL less than 70    Hypothyroidism    Synthroid   Panhypopituitarism    Status post pituitary adenoma removal in 2012   Recurrent deep vein thrombosis (DVT) 10/01/2015    Past Surgical History:  Procedure Laterality Date   ARTHROSCOPY KNEE W/ DRILLING Left    BRAIN SURGERY  06/16/2010   Removal of pituitary adenoma   CARDIAC CATHETERIZATION  04/17/2007   Significant LAD tapering but no significant disease the distal stent. Patent Ramus stent. 90% lesion in small OM 2; small nondominant RCA. Medical therapy. EF 50-60%.   CORONARY ANGIOPLASTY WITH STENT PLACEMENT  07/17/2002   PCI-dLAD: 2.25 mm x 16 mm BMS;;PCI- RI: 2.75 mm 18 mm Cypher DES   IR RADIOLOGIST EVAL & MGMT  11/04/2021   IR RADIOLOGIST EVAL & MGMT  12/31/2021   IR RADIOLOGIST EVAL & MGMT  02/27/2022   NM MYOVIEW LTD  06/16/2010   a) Exercise ~9 min - 10 METS; no ischemia or infarction; b) 06/2021: LOW RISK.  Normal LV size and function.  EF 55 to 60%.  No RWMA.  No ischemia or infarct.  Fixed inferior basal apical defect with normal motion suggestive of artifact.  (Diaphragmatic attenuation)   PROSTATECTOMY N/A 08/13/2015   Procedure: TRANSVESICLE OPEN PROSTATECTOMY** ;  Surgeon: Carolan Clines, MD;  Location: [facility];  Service: Urology;  Laterality: N/A;   RADIOLOGY WITH ANESTHESIA N/A 12/04/2021   Procedure: LEFT RENAL CRYO  ABLATION;  Surgeon: Aletta Edouard, MD;  Location: [facility];  Service: Radiology;  Laterality: N/A;   SPINE SURGERY     L4-L5    Family History  Problem Relation Age of Onset   Cancer Mother        pancreatic ca   Sleep apnea Neg Hx  Social History:  reports that he quit smoking about 29 years ago. His smoking use included cigarettes. He has never used smokeless tobacco. He reports that he does not drink alcohol and does not use drugs.  Review of Systems:  No further recurrence of gout, treated with colchicine in 5/22 Not on allopurinol   Lab Results  Component Value Date   LABURIC 8.0 (H) 01/24/2021     Hypertension:  He is taking 10 mg amlodipine  amlodipine prescribed by his cardiologist   BP Readings from Last 3 Encounters:  09/16/22 130/82  08/27/22 112/66  08/07/22 122/68   Renal function history:   Creatinine is relatively higher again  He had a high urine microalbumin in 9/22 with his [facility] but normal here  Lab Results  Component Value Date   CREATININE 1.80 (H) 09/11/2022   CREATININE 1.48 07/29/2022   CREATININE 1.56 (H) 05/06/2022    HYPOPITUITARISM: He has been taking levothyroxine and Cortef as prescribed Takes Cortef 20 mg in the morning and 10 mg in the late afternoon Not clear if he is taking this as the pharmacy data does not show any refills but he says he is taking it  Hypothyroidism:  Free T4 has been consistently normal with  dosage of 88 mcg levothyroxine  Lab Results  Component Value Date   TSH 4.34 07/29/2022   TSH 2.38 12/23/2021   TSH 4.57 04/19/2021   FREET4 0.93 09/11/2022   FREET4 0.96 01/02/2022   FREET4 1.11 08/20/2021    Eye exams done periodically at  the [facility]   Hypercholesterolemia: Treated with rosuvastatin by PCP  Lab Results  Component Value Date   CHOL 110 07/29/2022   HDL 40.10 07/29/2022   LDLCALC 54 07/29/2022   LDLDIRECT 85.0 05/18/2014   TRIG 79.0 07/29/2022   CHOLHDL 3  07/29/2022        LABS:  Lab on 09/11/2022  Component Date Value Ref Range Status   Microalb, Ur 09/11/2022 7.2 (H)  0.0 - 1.9 mg/dL Final   Creatinine,U 09/11/2022 83.7  mg/dL Final   Microalb Creat Ratio 09/11/2022 8.6  0.0 - 30.0 mg/g Final   Free T4 09/11/2022 0.93  0.60 - 1.60 ng/dL Final   Comment: Specimens from patients who are undergoing biotin therapy and /or ingesting biotin supplements may contain high levels of biotin.  The higher biotin concentration in these specimens interferes with this Free T4 assay.  Specimens that contain high levels  of biotin may cause false high results for this Free T4 assay.  Please interpret results in light of the total clinical presentation of the patient.     Sodium 09/11/2022 139  135 - 145 mEq/L Final   Potassium 09/11/2022 4.0  3.5 - 5.1 mEq/L Final   Chloride 09/11/2022 109  96 - 112 mEq/L Final   CO2 09/11/2022 23  19 - 32 mEq/L Final   Glucose, Bld 09/11/2022 99  70 - 99 mg/dL Final   BUN 09/11/2022 24 (H)  6 - 23 mg/dL Final   Creatinine, Ser 09/11/2022 1.80 (H)  0.40 - 1.50 mg/dL Final   Total Bilirubin 09/11/2022 0.3  0.2 - 1.2 mg/dL Final   Alkaline Phosphatase 09/11/2022 72  39 - 117 U/L Final   AST 09/11/2022 12  0 - 37 U/L Final   ALT 09/11/2022 11  0 - 53 U/L Final   Total Protein 09/11/2022 7.1  6.0 - 8.3 g/dL Final   Albumin 09/11/2022 4.1  3.5 - 5.2  g/dL Final   GFR 09/11/2022 35.54 (L)  >60.00 mL/min Final   Calculated using the CKD-EPI Creatinine Equation (2021)   Calcium 09/11/2022 9.4  8.4 - 10.5 mg/dL Final   Hgb A1c MFr Bld 09/11/2022 7.2 (H)  4.6 - 6.5 % Final   Glycemic Control Guidelines for People with Diabetes:Non Diabetic:  <6%Goal of Therapy: <7%Additional Action Suggested:  >8%      Examination:   BP 130/82 (BP Location: Left Arm, Patient Position: Sitting, Cuff Size: Normal)   Pulse 70   Ht 5\' 9"  (1.753 m)   Wt 224 lb (101.6 kg)   SpO2 92%   BMI 33.08 kg/m   Body mass index is 33.08 kg/m.    Standing blood pressure 130/75 No edema   ASSESSMENT/ PLAN:    Diabetes type 2 with mild obesity:   See history of present illness for detailed discussion of current diabetes management, blood sugar patterns and problems identified  A1c is 7.2 compared to 7.5  Blood sugars not assessed because he did not bring his monitor  He is on premixed insulin twice a day and Xigduo   By recall his blood sugars are fairly good after meals but blood sugars may be occasionally low normal fasting but not clear how often, lab glucose was 99 He has maintained his weight Likely has done relatively well on his diet as A1c is improving Level of control is somewhat limited because of using premixed insulin   RENAL dysfunction: Relatively worse and no clear factors identified Discussed that labs will be forwarded to PCP and if creatinine consistently high may consider nephrology consultation  Hypertension: Blood pressure is normal with no orthostatic drop  History of pituitary tumor: Thyroid levels are quite normal and he will continue 88 mcg levothyroxine Unclear again whether he is taking his Cortef but not symptomatic and reminded him that he needs to call us to let us know if he still has this at home, if he is not getting this will leave it off  Continue same dose of levothyroxine as free T4 is fairly good Hypogonadism: He has refused to consider supplementation and did not appear very symptomatic again  PLAN: He needs to bring his monitor for download on each visit Assuming that he does not have any consistently high readings will not increase his doses as yet Consistently after meals Watch carbohydrate foods Discussed targets both before and after meals  There are no Patient Instructions on file for this visit.     Elayne Snare 09/16/2022, 8:20 AM

## 2022-09-16 ENCOUNTER — Ambulatory Visit (INDEPENDENT_AMBULATORY_CARE_PROVIDER_SITE_OTHER): Payer: Medicare Other | Admitting: Endocrinology

## 2022-09-16 ENCOUNTER — Encounter: Payer: Self-pay | Admitting: Endocrinology

## 2022-09-16 VITALS — BP 132/68 | HR 70 | Ht 69.0 in | Wt 224.0 lb

## 2022-09-16 DIAGNOSIS — N1831 Chronic kidney disease, stage 3a: Secondary | ICD-10-CM | POA: Diagnosis not present

## 2022-09-16 DIAGNOSIS — E230 Hypopituitarism: Secondary | ICD-10-CM

## 2022-09-16 DIAGNOSIS — Z794 Long term (current) use of insulin: Secondary | ICD-10-CM | POA: Diagnosis not present

## 2022-09-16 DIAGNOSIS — E1165 Type 2 diabetes mellitus with hyperglycemia: Secondary | ICD-10-CM

## 2022-09-16 NOTE — Patient Instructions (Addendum)
Check on hydrocortisone  Check blood sugars on waking up 3 days a week  Also check blood sugars about 2 hours after meals and do this after different meals by rotation  Recommended blood sugar levels on waking up are 90-130 and about 2 hours after meal is 130-160  Please bring your blood sugar monitor to each visit, thank you  Call if getting more frequent blood sugars below 90 on waking up or having weak or shaky episodes in the morning

## 2022-09-18 ENCOUNTER — Encounter: Payer: Medicare Other | Admitting: Family Medicine

## 2022-10-02 ENCOUNTER — Ambulatory Visit (INDEPENDENT_AMBULATORY_CARE_PROVIDER_SITE_OTHER): Payer: Medicare Other | Admitting: *Deleted

## 2022-10-02 DIAGNOSIS — Z0000 Encounter for general adult medical examination without abnormal findings: Secondary | ICD-10-CM

## 2022-10-02 NOTE — Patient Instructions (Signed)
Carl Gutierrez , Thank you for taking time to come for your Medicare Wellness Visit. I appreciate your ongoing commitment to your health goals. Please review the following plan we discussed and let me know if I can assist you in the future.   Screening recommendations/referrals: Colonoscopy: no longer required Recommended yearly ophthalmology/optometry visit for glaucoma screening and checkup Recommended yearly dental visit for hygiene and checkup  Vaccinations: Influenza vaccine: Education provided Pneumococcal vaccine: up to date Tdap vaccine: Education provided Shingles vaccine: Education provided        Preventive Care 79 Years and Older, Male Preventive care refers to lifestyle choices and visits with your health care provider that can promote health and wellness. What does preventive care include? A yearly physical exam. This is also called an annual well check. Dental exams once or twice a year. Routine eye exams. Ask your health care provider how often you should have your eyes checked. Personal lifestyle choices, including: Daily care of your teeth and gums. Regular physical activity. Eating a healthy diet. Avoiding tobacco and drug use. Limiting alcohol use. Practicing safe sex. Taking low doses of aspirin every day. Taking vitamin and mineral supplements as recommended by your health care provider. What happens during an annual well check? The services and screenings done by your health care provider during your annual well check will depend on your age, overall health, lifestyle risk factors, and family history of disease. Counseling  Your health care provider may ask you questions about your: Alcohol use. Tobacco use. Drug use. Emotional well-being. Home and relationship well-being. Sexual activity. Eating habits. History of falls. Memory and ability to understand (cognition). Work and work Astronomer. Screening  You may have the following tests or  measurements: Height, weight, and BMI. Blood pressure. Lipid and cholesterol levels. These may be checked every 5 years, or more frequently if you are over 30 years old. Skin check. Lung cancer screening. You may have this screening every year starting at age 79 if you have a 30-pack-year history of smoking and currently smoke or have quit within the past 15 years. Fecal occult blood test (FOBT) of the stool. You may have this test every year starting at age 79. Flexible sigmoidoscopy or colonoscopy. You may have a sigmoidoscopy every 5 years or a colonoscopy every 10 years starting at age 14. Prostate cancer screening. Recommendations will vary depending on your family history and other risks. Hepatitis C blood test. Hepatitis B blood test. Sexually transmitted disease (STD) testing. Diabetes screening. This is done by checking your blood sugar (glucose) after you have not eaten for a while (fasting). You may have this done every 1-3 years. Abdominal aortic aneurysm (AAA) screening. You may need this if you are a current or former smoker. Osteoporosis. You may be screened starting at age 79 if you are at high risk. Talk with your health care provider about your test results, treatment options, and if necessary, the need for more tests. Vaccines  Your health care provider may recommend certain vaccines, such as: Influenza vaccine. This is recommended every year. Tetanus, diphtheria, and acellular pertussis (Tdap, Td) vaccine. You may need a Td booster every 10 years. Zoster vaccine. You may need this after age 79. Pneumococcal 13-valent conjugate (PCV13) vaccine. One dose is recommended after age 79. Pneumococcal polysaccharide (PPSV23) vaccine. One dose is recommended after age 79. Talk to your health care provider about which screenings and vaccines you need and how often you need them. This information is not intended to  replace advice given to you by your health care provider. Make sure  you discuss any questions you have with your health care provider. Document Released: 06/29/2015 Document Revised: 02/20/2016 Document Reviewed: 04/03/2015 Elsevier Interactive Patient Education  2017 [employer].  Fall Prevention in the Home Falls can cause injuries. They can happen to people of all ages. There are many things you can do to make your home safe and to help prevent falls. What can I do on the outside of my home? Regularly fix the edges of walkways and driveways and fix any cracks. Remove anything that might make you trip as you walk through a door, such as a raised step or threshold. Trim any bushes or trees on the path to your home. Use bright outdoor lighting. Clear any walking paths of anything that might make someone trip, such as rocks or tools. Regularly check to see if handrails are loose or broken. Make sure that both sides of any steps have handrails. Any raised decks and porches should have guardrails on the edges. Have any leaves, snow, or ice cleared regularly. Use sand or salt on walking paths during winter. Clean up any spills in your garage right away. This includes oil or grease spills. What can I do in the bathroom? Use night lights. Install grab bars by the toilet and in the tub and shower. Do not use towel bars as grab bars. Use non-skid mats or decals in the tub or shower. If you need to sit down in the shower, use a plastic, non-slip stool. Keep the floor dry. Clean up any water that spills on the floor as soon as it happens. Remove soap buildup in the tub or shower regularly. Attach bath mats securely with double-sided non-slip rug tape. Do not have throw rugs and other things on the floor that can make you trip. What can I do in the bedroom? Use night lights. Make sure that you have a light by your bed that is easy to reach. Do not use any sheets or blankets that are too big for your bed. They should not hang down onto the floor. Have a firm  chair that has side arms. You can use this for support while you get dressed. Do not have throw rugs and other things on the floor that can make you trip. What can I do in the kitchen? Clean up any spills right away. Avoid walking on wet floors. Keep items that you use a lot in easy-to-reach places. If you need to reach something above you, use a strong step stool that has a grab bar. Keep electrical cords out of the way. Do not use floor polish or wax that makes floors slippery. If you must use wax, use non-skid floor wax. Do not have throw rugs and other things on the floor that can make you trip. What can I do with my stairs? Do not leave any items on the stairs. Make sure that there are handrails on both sides of the stairs and use them. Fix handrails that are broken or loose. Make sure that handrails are as long as the stairways. Check any carpeting to make sure that it is firmly attached to the stairs. Fix any carpet that is loose or worn. Avoid having throw rugs at the top or bottom of the stairs. If you do have throw rugs, attach them to the floor with carpet tape. Make sure that you have a light switch at the top of the stairs and the  bottom of the stairs. If you do not have them, ask someone to add them for you. What else can I do to help prevent falls? Wear shoes that: Do not have high heels. Have rubber bottoms. Are comfortable and fit you well. Are closed at the toe. Do not wear sandals. If you use a stepladder: Make sure that it is fully opened. Do not climb a closed stepladder. Make sure that both sides of the stepladder are locked into place. Ask someone to hold it for you, if possible. Clearly mark and make sure that you can see: Any grab bars or handrails. First and last steps. Where the edge of each step is. Use tools that help you move around (mobility aids) if they are needed. These include: Canes. Walkers. Scooters. Crutches. Turn on the lights when you go  into a dark area. Replace any light bulbs as soon as they burn out. Set up your furniture so you have a clear path. Avoid moving your furniture around. If any of your floors are uneven, fix them. If there are any pets around you, be aware of where they are. Review your medicines with your doctor. Some medicines can make you feel dizzy. This can increase your chance of falling. Ask your doctor what other things that you can do to help prevent falls. This information is not intended to replace advice given to you by your health care provider. Make sure you discuss any questions you have with your health care provider. Document Released: 03/29/2009 Document Revised: 11/08/2015 Document Reviewed: 07/07/2014 Elsevier Interactive Patient Education  2017 [employer].

## 2022-10-02 NOTE — Progress Notes (Signed)
Subjective:   Carl Gutierrez is a 77 y.o. male who presents for Medicare Annual/Subsequent preventive examination.  I connected with  Carl Gutierrez on 10/02/22 by a telephone enabled telemedicine application and verified that I am speaking with the correct person using two identifiers.   I discussed the limitations of evaluation and management by telemedicine. The patient expressed understanding and agreed to proceed.  Patient location: home  Provider location: telephone/home   Review of Systems     Cardiac Risk Factors include: advanced age (>58men, >39 women);diabetes mellitus;male gender;hypertension;obesity (BMI >30kg/m2)     Objective:    Today's Vitals   10/02/22 1037  PainSc: 5    There is no height or weight on file to calculate BMI.     10/02/2022   10:39 AM 11/28/2021   11:11 AM 09/26/2021   10:59 AM 11/28/2019    8:35 AM 11/23/2018    8:16 AM 11/16/2017    8:14 AM 08/06/2017   11:57 AM  Advanced Directives  Does Patient Have a Medical Advance Directive? Yes Yes Yes Yes Yes Yes No  Type of Diplomatic Services operational officer Living will Healthcare Power of [place];Living will Healthcare Power of [place];Living will Healthcare Power of [place];Living will Healthcare Power of [place];Living will   Does patient want to make changes to medical advance directive? No - Patient declined    No - Patient declined Yes (MAU/Ambulatory/Procedural Areas - Information given)   Copy of Healthcare Power of Attorney in Chart? Yes - validated most recent copy scanned in chart (See row information)  No - copy requested Yes - validated most recent copy scanned in chart (See row information) No - copy requested Yes   Would patient like information on creating a medical advance directive?       No - Patient declined    Current Medications (verified) Outpatient Encounter Medications as of 10/02/2022  Medication Sig   Alcohol Swabs (PHARMACIST CHOICE ALCOHOL) PADS SMARTSIG:1 Each  Topical 4 Times Daily   amLODipine (NORVASC) 10 MG tablet TAKE 1 TABLET BY MOUTH EVERY DAY   aspirin 81 MG tablet Take 81 mg by mouth daily. Reported on 09/05/2015   B-D UF III MINI PEN NEEDLES 31G X 5 MM MISC USE FOR INSULIN PEN TWICE A DAY   glucose blood (ONETOUCH VERIO) test strip USE AS INSTRUCTED TO CHECK BLOOD SUGAR 2x  DAILY.   Insulin Lispro Prot & Lispro (HUMALOG 75/25 MIX) (75-25) 100 UNIT/ML Kwikpen INJECT 20 UNITS UNDER THE SKIN BEFORE BREAKFAST AND 6 UNITS BEFORE SUPPER. (REPLACES 70/30) (Patient taking differently: INJECT 20 UNITS UNDER THE SKIN BEFORE BREAKFAST AND 8 UNITS BEFORE SUPPER. (REPLACES 70/30))   levothyroxine (SYNTHROID) 88 MCG tablet Take 1 tablet (88 mcg total) by mouth daily.   metoprolol succinate (TOPROL-XL) 25 MG 24 hr tablet TAKE 1 TABLET (25 MG TOTAL) BY MOUTH DAILY.   rosuvastatin (CRESTOR) 40 MG tablet TAKE 1 TABLET BY MOUTH EVERY DAY   XIGDUO XR 10-998 MG TB24 TAKE 1 TABLET BY MOUTH EVERY DAY   nitroGLYCERIN (NITROSTAT) 0.4 MG SL tablet Place 1 tablet (0.4 mg total) under the tongue every 5 (five) minutes as needed for chest pain.   No facility-administered encounter medications on file as of 10/02/2022.    Allergies (verified) Hydrochlorothiazide and Other   History: Past Medical History:  Diagnosis Date   CAD S/P percutaneous coronary angioplasty February 2004; 2008   PCI-dLAD: 2.25 mm x 16 mm Express BMS; Ramus- 2.75 mm x 18 mm  Cypher DES (2.8 mm); follow-up 11/'08: Patent LAD/ramus stents. 90% small OM. EF 50-60%.; Myoview 1/'12: Exercise 9 min, 10 METS with no ischemia/infarction.    Clotting disorder    Diabetes mellitus    Low-dose metformin   DJD (degenerative joint disease), lumbosacral     status post L4-L5 surgery   Essential hypertension    History of DVT of lower extremity     postoperative   Hyperlipidemia with target LDL less than 70    Hypothyroidism    Synthroid   Panhypopituitarism    Status post pituitary adenoma removal in  2012   Recurrent deep vein thrombosis (DVT) 10/01/2015   Past Surgical History:  Procedure Laterality Date   ARTHROSCOPY KNEE W/ DRILLING Left    BRAIN SURGERY  06/16/2010   Removal of pituitary adenoma   CARDIAC CATHETERIZATION  04/17/2007   Significant LAD tapering but no significant disease the distal stent. Patent Ramus stent. 90% lesion in small OM 2; small nondominant RCA. Medical therapy. EF 50-60%.   CORONARY ANGIOPLASTY WITH STENT PLACEMENT  07/17/2002   PCI-dLAD: 2.25 mm x 16 mm BMS;;PCI- RI: 2.75 mm 18 mm Cypher DES   IR RADIOLOGIST EVAL & MGMT  11/04/2021   IR RADIOLOGIST EVAL & MGMT  12/31/2021   IR RADIOLOGIST EVAL & MGMT  02/27/2022   NM MYOVIEW LTD  06/16/2010   a) Exercise ~9 min - 10 METS; no ischemia or infarction; b) 06/2021: LOW RISK.  Normal LV size and function.  EF 55 to 60%.  No RWMA.  No ischemia or infarct.  Fixed inferior basal apical defect with normal motion suggestive of artifact.  (Diaphragmatic attenuation)   PROSTATECTOMY N/A 08/13/2015   Procedure: TRANSVESICLE OPEN PROSTATECTOMY** ;  Surgeon: Jethro Bolus, MD;  Location: [facility];  Service: Urology;  Laterality: N/A;   RADIOLOGY WITH ANESTHESIA N/A 12/04/2021   Procedure: LEFT RENAL CRYO ABLATION;  Surgeon: Irish Lack, MD;  Location: [facility];  Service: Radiology;  Laterality: N/A;   SPINE SURGERY     L4-L5   Family History  Problem Relation Age of Onset   Cancer Mother        pancreatic ca   Sleep apnea Neg Hx    Social History   Socioeconomic History   Marital status: Married    Spouse name: Not on file   Number of children: Not on file   Years of education: Not on file   Highest education level: Not on file  Occupational History   Not on file  Tobacco Use   Smoking status: Former    Types: Cigarettes    Quit date: 06/24/1993    Years since quitting: 29.2   Smokeless tobacco: Never  Vaping Use   Vaping Use: Never used  Substance and Sexual Activity   Alcohol use: No   Drug use:  No   Sexual activity: Not Currently    Comment: Local truck driver, 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 Exercises only occasionally. Not as much as he desires 2. Works as a Arts administrator.   He does not smoke cigarettes -- quit in 1995. Does not drink alcohol.   Social Determinants of Health   Financial Resource Strain: Low Risk  (10/02/2022)   Overall Financial Resource Strain (CARDIA)    Difficulty of Paying Living Expenses: Not very hard  Food  Insecurity: No Food Insecurity (10/02/2022)   Hunger Vital Sign    Worried About Running Out of Food in the Last Year: Never true    Ran Out of Food in the Last Year: Never true  Transportation Needs: No Transportation Needs (10/02/2022)   PRAPARE - Administrator, Civil Service (Medical): No    Lack of Transportation (Non-Medical): No  Physical Activity: Inactive (10/02/2022)   Exercise Vital Sign    Days of Exercise per Week: 0 days    Minutes of Exercise per Session: 0 min  Stress: No Stress Concern Present (10/02/2022)   [employer] of Occupational Health - Occupational Stress Questionnaire    Feeling of Stress : Not at all  Social Connections: Moderately Integrated (10/02/2022)   Social Connection and Isolation Panel [NHANES]    Frequency of Communication with Friends and Family: Three times a week    Frequency of Social Gatherings with Friends and Family: Twice a week    Attends Religious Services: More than 4 times per year    Active Member of [employer] or Organizations: No    Attends Engineer, structural: Never    Marital Status: Married    Tobacco Counseling Counseling given: Not Answered   Clinical Intake:  Pre-visit preparation completed: Yes  Pain : 0-10 Pain Score: 5  Pain Location: Knee Pain Orientation: Left Pain Descriptors / Indicators: Burning, Dull, Aching Pain Onset:  More than a month ago Pain Frequency: Intermittent     Diabetes: Yes CBG done?: No Did pt. bring in CBG monitor from home?: No  How often do you need to have someone help you when you read instructions, pamphlets, or other written materials from your doctor or pharmacy?: 1 - Never  Diabetic?  Yes  Nutrition Risk Assessment:  Has the patient had any N/V/D within the last 2 months?  No  Does the patient have any non-healing wounds?  No  Has the patient had any unintentional weight loss or weight gain?  No   Diabetes:  Is the patient diabetic?  Yes  If diabetic, was a CBG obtained today?  No  Did the patient bring in their glucometer from home?  No  How often do you monitor your CBG's? 1-2 times a week.   Financial Strains and Diabetes Management:  Are you having any financial strains with the device, your supplies or your medication? No .  Does the patient want to be seen by Chronic Care Management for management of their diabetes?  No  Would the patient like to be referred to a Nutritionist or for Diabetic Management?  No   Diabetic Exams:  Diabetic Eye Exam: Completed  Pt has been advised about the importance in completing this exam. A referral has been placed today. Message sent to referral coordinator for scheduling purposes. Advised pt to expect a call from office referred to regarding appt.  Diabetic Foot Exam:  Pt has been advised about the importance in completing this exam.   Interpreter Needed?: No  Information entered by :: Remi Haggard LPN   Activities of Daily Living    10/02/2022   10:41 AM 11/28/2021   11:14 AM  In your present state of health, do you have any difficulty performing the following activities:  Hearing? 1   Vision? 0   Difficulty concentrating or making decisions? 0   Walking or climbing stairs? 0   Dressing or bathing? 0   Doing errands, shopping? 0 0  Preparing Food and eating ?  N   Using the Toilet? N   In the past six months, have  you accidently leaked urine? N   Do you have problems with loss of bowel control? N   Managing your Medications? N   Managing your Finances? N   Housekeeping or managing your Housekeeping? N     Patient Care Team: Shade Flood, MD as PCP - General (Family Medicine) Marykay Lex, MD as PCP - Cardiology (Cardiology) Shade Flood, MD as Consulting Physician (Family Medicine)  Indicate any recent Medical Services you may have received from other than [facility] providers in the past year (date may be approximate).     Assessment:   This is a routine wellness examination for [place].  Hearing/Vision screen Hearing Screening - Comments:: Some hearing loss No hearing aids [facility]   up [facility] date  Dietary issues and exercise activities discussed: Current Exercise Habits: The patient does not participate in regular exercise at present, Exercise limited by: orthopedic condition(s)   Goals Addressed             This Visit's Progress    Patient Stated       Continue current lifestyle       Depression Screen    10/02/2022   10:44 AM 07/29/2022    7:59 AM 12/23/2021    9:43 AM 09/26/2021   10:59 AM 09/26/2021   10:58 AM 06/19/2021    7:59 AM 12/05/2020    7:57 AM  PHQ 2/9 Scores  PHQ - 2 Score 0 0 0 0 0 0 0  PHQ- 9 Score 0    Fall Risk    10/02/2022   10:39 AM 07/29/2022    7:59 AM 12/23/2021    9:43 AM 09/26/2021   11:00 AM 06/19/2021    7:58 AM  Fall Risk   Falls in the past year? 0 0 0 0 0  Number falls in past yr: 0 0 0 0 0  Injury with Fall? 0 0 0 0 0  Risk for fall due to :  No Fall Risks No Fall Risks  No Fall Risks  Follow up Falls evaluation completed;Education provided;Falls prevention discussed Falls evaluation completed Falls evaluation completed Falls evaluation completed Falls evaluation completed    FALL RISK PREVENTION PERTAINING TO THE HOME:  Any stairs in or around the home? No  If so, are there any without handrails?  No  Home free of loose throw rugs in walkways, pet beds, electrical cords, etc? Yes  Adequate lighting in your home to reduce risk of falls? Yes   ASSISTIVE DEVICES UTILIZED TO PREVENT FALLS:  Life alert? No  Use of a cane, walker or w/c? No  Grab bars in the bathroom? No  Shower chair or bench in shower? No  Elevated toilet seat or a handicapped toilet? No   TIMED UP AND GO:  Was the test performed? No .    Cognitive Function:        10/02/2022   10:40 AM 11/28/2019    8:32 AM 11/23/2018    8:22 AM 11/16/2017    8:10 AM  6CIT Screen  What Year? 0 points 0 points 0 points 0 points  What month? 0 points 0 points 0 points 0 points  What time? 0 points 0 points 0 points 0 points  Count back from 20 0 points 0 points 0 points 0 points  Months in reverse 0 points 0 points  0 points 0 points  Repeat phrase 0 points 0 points 0 points 0 points  Total Score 0 points 0 points 0 points 0 points    Immunizations Immunization History  Administered Date(s) Administered   Fluad Quad(high Dose 65+) 02/20/2019   Influenza Split 03/05/2020   Influenza, High Dose Seasonal PF 04/03/2015, 04/02/2017   Influenza-Unspecified 03/16/2013, 04/05/2014, 04/03/2015, 04/22/2016, 03/07/2021   Moderna Sars-Covid-2 Vaccination 07/19/2019, 08/18/2019, 04/12/2020, 09/28/2020   Pneumococcal Conjugate-13 01/22/2015   Pneumococcal Polysaccharide-23 06/16/2009   Tdap 06/16/2005    TDAP status: Due, Education has been provided regarding the importance of this vaccine. Advised may receive this vaccine at local pharmacy or Health Dept. Aware to provide a copy of the vaccination record if obtained from local pharmacy or Health Dept. Verbalized acceptance and understanding.  Flu Vaccine status: Due, Education has been provided regarding the importance of this vaccine. Advised may receive this vaccine at local pharmacy or Health Dept. Aware to provide a copy of the vaccination record if obtained from local pharmacy  or Health Dept. Verbalized acceptance and understanding.  Pneumococcal vaccine status: Up to date  Covid-19 vaccine status: Information provided on how to obtain vaccines.   Qualifies for Shingles Vaccine? Yes   Zostavax completed No   Shingrix Completed?: No.    Education has been provided regarding the importance of this vaccine. Patient has been advised to call insurance company to determine out of pocket expense if they have not yet received this vaccine. Advised may also receive vaccine at local pharmacy or Health Dept. Verbalized acceptance and understanding.  Screening Tests Health Maintenance  Topic Date Due   OPHTHALMOLOGY EXAM  07/24/2021   COVID-19 Vaccine (5 - 2023-24 season) 02/14/2022   Hepatitis C Screening  12/24/2022 (Originally 11/28/1963)   FOOT EXAM  10/25/2022   INFLUENZA VACCINE  01/15/2023   HEMOGLOBIN A1C  03/14/2023   Diabetic kidney evaluation - eGFR measurement  09/11/2023   Diabetic kidney evaluation - Urine ACR  09/11/2023   Medicare Annual Wellness (AWV)  10/02/2023   Pneumonia Vaccine 62+ Years old  Completed   Zoster Vaccines- Shingrix  Completed   HPV VACCINES  Aged Out   DTaP/Tdap/Td  Discontinued   COLONOSCOPY (Pts 45-19yrs Insurance coverage will need to be confirmed)  Discontinued    Health Maintenance  Health Maintenance Due  Topic Date Due   OPHTHALMOLOGY EXAM  07/24/2021   COVID-19 Vaccine (5 - 2023-24 season) 02/14/2022    Colorectal cancer screening: No longer required.   Lung Cancer Screening: (Low Dose CT Chest recommended if Age 25-80 years, 30 pack-year currently smoking OR have quit w/in 15years.) does not qualify.   Lung Cancer Screening Referral:   Additional Screening:  Hepatitis C Screening:never done  Vision Screening: Recommended annual ophthalmology exams for early detection of glaucoma and other disorders of the eye. Is the patient up to date with their annual eye exam?  Yes  Who is the provider or what is the name  of the office in which the patient attends annual [facility] exams? [facility] If pt is not established with a provider, would they like to be referred to a provider to establish care? No .   Dental Screening: Recommended annual dental exams for proper oral hygiene  Community Resource Referral / Chronic Care Management: CRR required this visit?  No   CCM required this visit?  No      Plan:     I have personally reviewed and noted the following in the patient's chart:  Medical and social history Use of alcohol, tobacco or illicit drugs  Current medications and supplements including opioid prescriptions. Patient is not currently taking opioid prescriptions. Functional ability and status Nutritional status Physical activity Advanced directives List of other physicians Hospitalizations, surgeries, and ER visits in previous 12 months Vitals Screenings to include cognitive, depression, and falls Referrals and appointments  In addition, I have reviewed and discussed with patient certain preventive protocols, quality metrics, and best practice recommendations. A written personalized care plan for preventive services as well as general preventive health recommendations were provided to patient.     Remi Haggard, LPN   [id number]/61/0960   Nurse Notes:

## 2022-10-05 ENCOUNTER — Other Ambulatory Visit: Payer: Self-pay | Admitting: Endocrinology

## 2022-10-08 ENCOUNTER — Telehealth: Payer: Self-pay | Admitting: Neurology

## 2022-10-08 NOTE — Telephone Encounter (Signed)
10/07/22 left VM [place] 08/27/22 U[employer] medicare no auth req EE

## 2022-10-13 ENCOUNTER — Encounter: Payer: Self-pay | Admitting: Family Medicine

## 2022-10-13 ENCOUNTER — Ambulatory Visit (INDEPENDENT_AMBULATORY_CARE_PROVIDER_SITE_OTHER): Payer: Medicare Other | Admitting: Family Medicine

## 2022-10-13 VITALS — BP 136/64 | HR 73 | Temp 98.4°F | Ht 69.0 in | Wt 226.2 lb

## 2022-10-13 DIAGNOSIS — Z794 Long term (current) use of insulin: Secondary | ICD-10-CM

## 2022-10-13 DIAGNOSIS — E1165 Type 2 diabetes mellitus with hyperglycemia: Secondary | ICD-10-CM | POA: Diagnosis not present

## 2022-10-13 DIAGNOSIS — I10 Essential (primary) hypertension: Secondary | ICD-10-CM

## 2022-10-13 DIAGNOSIS — Z0000 Encounter for general adult medical examination without abnormal findings: Secondary | ICD-10-CM | POA: Diagnosis not present

## 2022-10-13 DIAGNOSIS — E1169 Type 2 diabetes mellitus with other specified complication: Secondary | ICD-10-CM | POA: Diagnosis not present

## 2022-10-13 DIAGNOSIS — R7989 Other specified abnormal findings of blood chemistry: Secondary | ICD-10-CM | POA: Diagnosis not present

## 2022-10-13 DIAGNOSIS — E785 Hyperlipidemia, unspecified: Secondary | ICD-10-CM

## 2022-10-13 DIAGNOSIS — E039 Hypothyroidism, unspecified: Secondary | ICD-10-CM

## 2022-10-13 LAB — BASIC METABOLIC PANEL
BUN: 21 mg/dL (ref 6–23)
CO2: 24 mEq/L (ref 19–32)
Calcium: 9.2 mg/dL (ref 8.4–10.5)
Chloride: 106 mEq/L (ref 96–112)
Creatinine, Ser: 1.61 mg/dL — ABNORMAL HIGH (ref 0.40–1.50)
GFR: 40.6 mL/min — ABNORMAL LOW (ref >60.00)
Glucose, Bld: 204 mg/dL — ABNORMAL HIGH (ref 70–99)
Potassium: 4 mEq/L (ref 3.5–5.1)
Sodium: 139 mEq/L (ref 135–145)

## 2022-10-13 MED ORDER — METOPROLOL SUCCINATE ER 25 MG PO TB24: 25.0000 mg | ORAL_TABLET | Freq: Every day | ORAL | 1 refills | Status: DC

## 2022-10-13 MED ORDER — ROSUVASTATIN CALCIUM 40 MG PO TABS: 40.0000 mg | ORAL_TABLET | Freq: Every day | ORAL | 1 refills | Status: DC

## 2022-10-13 MED ORDER — AMLODIPINE BESYLATE 10 MG PO TABS: 10.0000 mg | ORAL_TABLET | Freq: Every day | ORAL | 1 refills | Status: DC

## 2022-10-13 NOTE — Progress Notes (Signed)
Subjective:  Patient ID: Carl Gutierrez, male    DOB: [birth year]  Age: 77 y.o. MRN: [id number]  CC:  Chief Complaint  Patient presents with   Annual Exam    HPI Carl Gutierrez presents for Annual Exam  Endocrinology, Dr. Lucianne Muss, treated for type 2 diabetes, panhypopituitarism, stage IIIa CKD.  Appointment April 2.  Treated with levothyroxine, Cortef for hypopituitarism.  Xigduo, Humalog for diabetes.  He is on statin.  Microalbumin elevated at 7.2 31-month ago, elevated previously. Ophthalmology: few months ago - at the [place].   Cardiology, Dr. Herbie Baltimore, recent visit in February with Edd Fabian, NP.  History of CAD.  Stress testing January 2023 without ischemia, EF 55 to 60%.  Treated with amlodipine, aspirin, metoprolol, and rosuvastatin.  Nitroglycerin as needed.  Handout given on salty 6 and low-sodium diet at his February visit.  Also with recent visit with sleep specialist, being evaluated for sleep apnea - has not yet had testing.   Urology, Dr. Liliane Shi, prior left renal mass, status post percutaneous cryoablation with biopsy.  2023.  Dr. Fredia Sorrow. Last visit with urology within a year.   Elevated creatinine Concern at recent endocrinology visit of worsening renal function.  Possible nephrology consultation if persistent elevated creatinine or worsening.  Creatinine 1.56-1 0.48-1.80 since November of last year. Lab Results  Component Value Date   CREATININE 1.80 (H) 09/11/2022   Hypertension: Plan for recheck creatinine as above.  Amlodipine 10 mg daily, Toprol-XL 25 mg daily Home readings: none.  No new side effects with meds.  BP Readings from Last 3 Encounters:  10/13/22 136/64  09/16/22 132/68  08/27/22 112/66   Lab Results  Component Value Date   CREATININE 1.80 (H) 09/11/2022    Hyperlipidemia: Crestor 40 mg daily - no new myalgia/side effects.  Lab Results  Component Value Date   CHOL 110 07/29/2022   HDL 40.10 07/29/2022   LDLCALC 54 07/29/2022   LDLDIRECT 85.0  05/18/2014   TRIG 79.0 07/29/2022   CHOLHDL 3 07/29/2022   Lab Results  Component Value Date   ALT 11 09/11/2022   AST 12 09/11/2022   ALKPHOS 72 09/11/2022   BILITOT 0.3 09/11/2022         10/13/2022    1:01 PM 10/02/2022   10:44 AM 07/29/2022    7:59 AM 12/23/2021    9:43 AM 09/26/2021   10:59 AM  Depression screen PHQ 2/9  Decreased Interest 0 0 0 0 0  Down, Depressed, Hopeless 0 0 0 0 0  PHQ - 2 Score 0 0 0 0 0  Altered sleeping 0 1 1    Tired, decreased energy 0 0 1    Change in appetite 0 0 0    Feeling bad or failure about yourself  0 0 0    Trouble concentrating 0 0 0    Moving slowly or fidgety/restless 0 0 0    Suicidal thoughts 0 0 0    PHQ-9 Score 0 1 2    Difficult doing work/chores Not difficult at all Not difficult at all       Health Maintenance  Topic Date Due   OPHTHALMOLOGY EXAM  07/24/2021   COVID-19 Vaccine (6 - 2023-24 season) 05/21/2022   Hepatitis C Screening  12/24/2022 (Originally 11/28/1963)   FOOT EXAM  10/25/2022   INFLUENZA VACCINE  01/15/2023   HEMOGLOBIN A1C  03/14/2023   Diabetic kidney evaluation - eGFR measurement  09/11/2023   Diabetic kidney evaluation - Urine ACR  09/11/2023  Medicare Annual Wellness (AWV)  10/02/2023   Pneumonia Vaccine 64+ Years old  Completed   Zoster Vaccines- Shingrix  Completed   HPV VACCINES  Aged Out   DTaP/Tdap/Td  Discontinued   COLONOSCOPY (Pts 45-39yrs Insurance coverage will need to be confirmed)  Discontinued  Colonoscopy 06/16/2009 - was told not needing further testing.   Immunization History  Administered Date(s) Administered   Fluad Quad(high Dose 65+) 02/20/2019   Influenza Split 03/05/2020   Influenza, High Dose Seasonal PF 04/03/2015, 04/02/2017   Influenza-Unspecified 03/16/2013, 04/05/2014, 04/03/2015, 04/22/2016, 03/07/2021   Moderna Sars-Covid-2 Vaccination 07/19/2019, 08/18/2019, 04/12/2020, 09/28/2020   Pneumococcal Conjugate-13 01/22/2015   Pneumococcal Polysaccharide-23  06/16/2009   Tdap 06/16/2005    No results found. [facility]   Dental: recommended.   Alcohol: none  Tobacco: none  Exercise: walking, 3-4 days per week, yardwork. Some knee limitations.   Skin bump on left ear for years without recent change. Declines derm referral at this time.    History Patient Active Problem List   Diagnosis Date Noted   Left renal mass 12/04/2021   Chest pain with low risk for cardiac etiology 09/08/2021   Gout of big toe 10/31/2020   Stage 3a chronic kidney disease (HCC) 01/04/2020   Anemia in chronic kidney disease 11/30/2015   Recurrent deep vein thrombosis (DVT) (HCC) 10/01/2015   Hematuria, unspecified 10/01/2015   Elevated serum creatinine 10/01/2015   BPH (benign prostatic hyperplasia) 08/13/2015   Benign localized hyperplasia of prostate with urinary obstruction 07/23/2015   Preop cardiovascular exam 07/17/2015   Encounter for CDL (commercial driving license) exam 16/03/9603   Obesity (BMI 30-39.9) 06/26/2013   Essential hypertension 06/26/2013   Panhypopituitarism (HCC) 11/17/2011     11/17/2011   Diabetes mellitus (HCC) 11/17/2011   Hyperlipidemia associated with type 2 diabetes mellitus (HCC) 11/17/2011   CAD S/P percutaneous coronary angioplasty 07/19/2002   Past Medical History:  Diagnosis Date   CAD S/P percutaneous coronary angioplasty February 2004; 2008   PCI-dLAD: 2.25 mm x 16 mm Express BMS; Ramus- 2.75 mm x 18 mm Cypher DES (2.8 mm); follow-up 11/'08: Patent LAD/ramus stents. 90% small OM. EF 50-60%.; Myoview 1/'12: Exercise 9 min, 10 METS with no ischemia/infarction.    Clotting disorder (HCC)    Diabetes mellitus    Low-dose metformin   DJD (degenerative joint disease), lumbosacral     status post L4-L5 surgery   Essential hypertension    History of DVT of lower extremity     postoperative   Hyperlipidemia with target LDL less than 70    Hypothyroidism    Synthroid   Panhypopituitarism (HCC)     Status post pituitary adenoma removal in 2012   Recurrent deep vein thrombosis (DVT) (HCC) 10/01/2015   Past Surgical History:  Procedure Laterality Date   ARTHROSCOPY KNEE W/ DRILLING Left    BRAIN SURGERY  06/16/2010   Removal of pituitary adenoma   CARDIAC CATHETERIZATION  04/17/2007   Significant LAD tapering but no significant disease the distal stent. Patent Ramus stent. 90% lesion in small OM 2; small nondominant RCA. Medical therapy. EF 50-60%.   CORONARY ANGIOPLASTY WITH STENT PLACEMENT  07/17/2002   PCI-dLAD: 2.25 mm x 16 mm BMS;;PCI- RI: 2.75 mm 18 mm Cypher DES   IR RADIOLOGIST EVAL & MGMT  11/04/2021   IR RADIOLOGIST EVAL & MGMT  12/31/2021   IR RADIOLOGIST EVAL & MGMT  02/27/2022   NM MYOVIEW LTD  06/16/2010   a) Exercise ~  9 min - 10 METS; no ischemia or infarction; b) 06/2021: LOW RISK.  Normal LV size and function.  EF 55 to 60%.  No RWMA.  No ischemia or infarct.  Fixed inferior basal apical defect with normal motion suggestive of artifact.  (Diaphragmatic attenuation)   PROSTATECTOMY N/A 08/13/2015   Procedure: TRANSVESICLE OPEN PROSTATECTOMY** ;  Surgeon: Jethro Bolus, MD;  Location: [facility];  Service: Urology;  Laterality: N/A;   RADIOLOGY WITH ANESTHESIA N/A 12/04/2021   Procedure: LEFT RENAL CRYO ABLATION;  Surgeon: Irish Lack, MD;  Location: [facility];  Service: Radiology;  Laterality: N/A;   SPINE SURGERY     L4-L5   Allergies  Allergen Reactions   Hydrochlorothiazide     Leg and side pain with this   Other     Blood pressure med name unknown.   Prior to Admission medications   Medication Sig Start Date End Date Taking? Authorizing Provider  Alcohol Swabs (PHARMACIST CHOICE ALCOHOL) PADS SMARTSIG:1 Each Topical 4 Times Daily 07/17/21  Yes [provider]  amLODipine (NORVASC) 10 MG tablet TAKE 1 TABLET BY MOUTH EVERY DAY 06/23/22  Yes Shade Flood, MD  aspirin 81 MG tablet Take 81 mg by mouth daily. Reported on 09/05/2015   Yes [provider]  B-D UF III MINI PEN NEEDLES 31G X 5 MM MISC USE FOR INSULIN PEN TWICE A DAY 04/29/21  Yes Reather Littler, MD  glucose blood (ONETOUCH VERIO) test strip USE AS INSTRUCTED TO CHECK BLOOD SUGAR 2X DAILY. 10/05/22  Yes Reather Littler, MD  Insulin Lispro Prot & Lispro (HUMALOG 75/25 MIX) (75-25) 100 UNIT/ML Kwikpen INJECT 20 UNITS UNDER THE SKIN BEFORE BREAKFAST AND 6 UNITS BEFORE SUPPER. (REPLACES 70/30) Patient taking differently: INJECT 20 UNITS UNDER THE SKIN BEFORE BREAKFAST AND 8 UNITS BEFORE SUPPER. (REPLACES 70/30) 06/23/22  Yes Reather Littler, MD  levothyroxine (SYNTHROID) 88 MCG tablet Take 1 tablet (88 mcg total) by mouth daily. 07/29/22  Yes Shade Flood, MD  metoprolol succinate (TOPROL-XL) 25 MG 24 hr tablet TAKE 1 TABLET (25 MG TOTAL) BY MOUTH DAILY. 06/23/22  Yes Shade Flood, MD  rosuvastatin (CRESTOR) 40 MG tablet TAKE 1 TABLET BY MOUTH EVERY DAY 06/23/22  Yes Shade Flood, MD  XIGDUO XR 10-998 MG TB24 TAKE 1 TABLET BY MOUTH EVERY DAY 08/22/22  Yes Reather Littler, MD  nitroGLYCERIN (NITROSTAT) 0.4 MG SL tablet Place 1 tablet (0.4 mg total) under the tongue every 5 (five) minutes as needed for chest pain. 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 Exercises only occasionally. Not as much  as he desires  2. Works as a Arts administrator.   He does not smoke cigarettes -- quit in 1995. Does not drink alcohol.   Social Determinants of Health   Financial Resource Strain: Low Risk  (10/02/2022)   Overall Financial Resource Strain (CARDIA)    Difficulty of Paying Living Expenses: Not very hard  Food Insecurity: No Food Insecurity (10/02/2022)   Hunger Vital Sign    Worried About Running Out of Food in the Last Year: Never true    Ran Out of Food in the Last Year: Never true  Transportation Needs: No Transportation Needs (10/02/2022)   PRAPARE - Administrator, Civil Service (Medical): No    Lack of Transportation (Non-Medical): No  Physical Activity: Inactive (10/02/2022)   Exercise Vital Sign    Days of Exercise per Week: 0 days    Minutes of Exercise per Session: 0 min  Stress: No Stress Concern Present (10/02/2022)   [employer] of Occupational Health - Occupational Stress Questionnaire    Feeling of Stress : Not at all  Social Connections: Moderately Integrated (10/02/2022)   Social Connection and Isolation Panel [NHANES]    Frequency of Communication with Friends and Family: Three times a week    Frequency of Social Gatherings with Friends and Family: Twice a week    Attends Religious Services: More than 4 times per year    Active Member of [employer] or Organizations: No    Attends Banker Meetings: Never    Marital Status: Married  Catering manager Violence: Not At Risk (10/02/2022)   Humiliation, Afraid, Rape, and Kick questionnaire    Fear of Current or Ex-Partner: No    Emotionally Abused: No    Physically Abused: No    Sexually Abused: No    Review of Systems 13 point review of systems per patient health survey noted.  Negative other than as indicated above or in HPI.    Objective:   Vitals:   10/13/22 1253  BP: 136/64  Pulse: 73  Temp: 98.4 F (36.9 C)  TempSrc: Temporal  SpO2: 95%  Weight: 226 lb 3.2 oz (102.6 kg)   Height: 5\' 9"  (1.753 m)     Physical Exam Vitals reviewed.  Constitutional:      Appearance: He is well-developed.  HENT:     Head: Normocephalic and atraumatic.     Right Ear: External ear normal.     Left Ear: External ear normal.  Eyes:     Conjunctiva/sclera: Conjunctivae normal.     Pupils: Pupils are equal, round, and reactive to light.  Neck:     Thyroid: No thyromegaly.  Cardiovascular:     Rate and Rhythm: Normal rate and regular rhythm.     Heart sounds: Normal heart sounds.  Pulmonary:     Effort: Pulmonary effort is normal. No respiratory distress.     Breath sounds: Normal breath sounds. No wheezing.  Abdominal:     General: There is no distension.     Palpations: Abdomen is soft.     Tenderness: There is no abdominal tenderness.  Musculoskeletal:        General: No tenderness. Normal range of motion.     Cervical back: Normal range of motion and neck supple.  Lymphadenopathy:     Cervical: No cervical adenopathy.  Skin:    General: Skin is warm and dry.     Comments: Soft rounded lesion upper R pinna, nontender, no surrounding erythema.  No discharge.  Neurological:  Mental Status: He is alert and oriented to person, place, and time.     Deep Tendon Reflexes: Reflexes are normal and symmetric.  Psychiatric:        Mood and Affect: Mood normal.        Behavior: Behavior normal.        Assessment & Plan:  Carl Gutierrez is a 77 y.o. male . Physical exam  - -anticipatory guidance as below in AVS, screening labs above. Health maintenance items as above in HPI discussed/recommended as applicable.   Essential hypertension - Plan: amLODipine (NORVASC) 10 MG tablet, metoprolol succinate (TOPROL-XL) 25 MG 24 hr tablet, Basic metabolic panel  -Stable, continue same regimen, check labs.  Continue follow-up with cardiology as planned, as well as sleep study to rule out OSA as may also contribute to hypertension  Hyperlipidemia associated with type 2  diabetes mellitus (HCC) Hyperlipidemia, unspecified hyperlipidemia type - Plan: rosuvastatin (CRESTOR) 40 MG tablet  -Tolerating statin, continue same.  Type 2 diabetes mellitus with hyperglycemia, with long-term current use of insulin (HCC) Hypothyroidism, unspecified type  -Recent visit with endocrinology who continues to manage diabetes and thyroid, no new meds/labs at this time.  Elevated serum creatinine - Plan: Basic metabolic panel  -CKD but recent elevated creatinine of 1.8.  Recheck creatinine and if upwards trend would consider nephrology eval.  Meds ordered this encounter  Medications   amLODipine (NORVASC) 10 MG tablet    Sig: Take 1 tablet (10 mg total) by mouth daily.    Dispense:  90 tablet    Refill:  1   metoprolol succinate (TOPROL-XL) 25 MG 24 hr tablet    Sig: Take 1 tablet (25 mg total) by mouth daily.    Dispense:  90 tablet    Refill:  1   rosuvastatin (CRESTOR) 40 MG tablet    Sig: Take 1 tablet (40 mg total) by mouth daily.    Dispense:  90 tablet    Refill:  1   Patient Instructions  I will check some labs today including kidney test. If that is increasing we may need to have you see a kidney specialist.   Please call urology to find out when follow up due.  Please have eye doctor send me a report of your last exam for diabetes.  I would consider updated pneumonia vaccine and RSV vaccine at your pharmacy.  Updated covid and flu shots this fall.  I would recommend routine dental visit.  Thanks for coming in today and hang in there.   There is a recommendation for RSV vaccine for patients over age 16.   Typically would recommend the RSV vaccine as most important for patients over age 60 that have comorbidities that put them at increased risk for severe disease (heart disease such as congestive heart failure, coronary artery disease, lung disease such as asthma or COPD, kidney disease, liver disease, diabetes, chronic or progressive neurologic or muscular  conditions, immunosuppressed, or being frail or of advanced age).  For others that do not have these risk factors, there still is some benefit from vaccination since age is one of the main risk factors for developing severe disease however baseline risk of developing severe disease and requiring hospitalization is likely to be lower compared to those that have comorbidities in addition to age.   CDC does have some information as well: [employer]  Preventive Care 63 Years and Older, Male Preventive care refers to lifestyle choices and visits with your health care provider that can  promote health and wellness. Preventive care visits are also called wellness exams. What can I expect for my preventive care visit? Counseling During your preventive care visit, your health care provider may ask about your: Medical history, including: Past medical problems. Family medical history. History of falls. Current health, including: Emotional well-being. Home life and relationship well-being. Sexual activity. Memory and ability to understand (cognition). Lifestyle, including: Alcohol, nicotine or tobacco, and drug use. Access to firearms. Diet, exercise, and sleep habits. Work and work Astronomer. Sunscreen use. Safety issues such as seatbelt and bike helmet use. Physical exam Your health care provider will check your: Height and weight. These may be used to calculate your BMI (body mass index). BMI is a measurement that tells if you are at a healthy weight. Waist circumference. This measures the distance around your waistline. This measurement also tells if you are at a healthy weight and may help predict your risk of certain diseases, such as type 2 diabetes and high blood pressure. Heart rate and blood pressure. Body temperature. Skin for abnormal spots. What immunizations do I need?  Vaccines are usually given at various ages, according to a  schedule. Your health care provider will recommend vaccines for you based on your age, medical history, and lifestyle or other factors, such as travel or where you work. What tests do I need? Screening Your health care provider may recommend screening tests for certain conditions. This may include: Lipid and cholesterol levels. Diabetes screening. This is done by checking your blood sugar (glucose) after you have not eaten for a while (fasting). Hepatitis C test. Hepatitis B test. HIV (human immunodeficiency virus) test. STI (sexually transmitted infection) testing, if you are at risk. Lung cancer screening. Colorectal cancer screening. Prostate cancer screening. Abdominal aortic aneurysm (AAA) screening. You may need this if you are a current or former smoker. Talk with your health care provider about your test results, treatment options, and if necessary, the need for more tests. Follow these instructions at home: Eating and drinking  Eat a diet that includes fresh fruits and vegetables, whole grains, lean protein, and low-fat dairy products. Limit your intake of foods with high amounts of sugar, saturated fats, and salt. Take vitamin and mineral supplements as recommended by your health care provider. Do not drink alcohol if your health care provider tells you not to drink. If you drink alcohol: Limit how much you have to 0-2 drinks a day. Know how much alcohol is in your drink. In the [place] one drink equals one 12 oz bottle of beer (355 mL), one 5 oz glass of wine (148 mL), or one 1 oz glass of hard liquor (44 mL). Lifestyle Brush your teeth every morning and night with fluoride toothpaste. Floss one time each day. Exercise for at least 30 minutes 5 or more days each week. Do not use any products that contain nicotine or tobacco. These products include cigarettes, chewing tobacco, and vaping devices, such as e-cigarettes. If you need help quitting, ask your health care provider. Do  not use drugs. If you are sexually active, practice safe sex. Use a condom or other form of protection to prevent STIs. Take aspirin only as told by your health care provider. Make sure that you understand how much to take and what form to take. Work with your health care provider to find out whether it is safe and beneficial for you to take aspirin daily. Ask your health care provider if you need to take a cholesterol-lowering medicine (  statin). Find healthy ways to manage stress, such as: Meditation, yoga, or listening to music. Journaling. Talking to a trusted person. Spending time with friends and family. Safety Always wear your seat belt while driving or riding in a vehicle. Do not drive: If you have been drinking alcohol. Do not ride with someone who has been drinking. When you are tired or distracted. While texting. If you have been using any mind-altering substances or drugs. Wear a helmet and other protective equipment during sports activities. If you have firearms in your house, make sure you follow all gun safety procedures. Minimize exposure to UV radiation to reduce your risk of skin cancer. What's next? Visit your health care provider once a year for an annual wellness visit. Ask your health care provider how often you should have your eyes and teeth checked. Stay up to date on all vaccines. This information is not intended to replace advice given to you by your health care provider. Make sure you discuss any questions you have with your health care provider. Document Revised: 11/28/2020 Document Reviewed: 11/28/2020 Elsevier Patient Education  2023 Elsevier Inc.     Signed,   Meredith Staggers, MD [facility], [facility] 10/13/22 1:38 PM

## 2022-10-13 NOTE — Patient Instructions (Addendum)
I will check some labs today including kidney test. If that is increasing we may need to have you see a kidney specialist.   Please call urology to find out when follow up due.  Please have eye doctor send me a report of your last exam for diabetes.  I would consider updated pneumonia vaccine and RSV vaccine at your pharmacy.  Updated covid and flu shots this fall.  I would recommend routine dental visit.  Thanks for coming in today and hang in there.   There is a recommendation for RSV vaccine for patients over age 79.   Typically would recommend the RSV vaccine as most important for patients over age 79 that have comorbidities that put them at increased risk for severe disease (heart disease such as congestive heart failure, coronary artery disease, lung disease such as asthma or COPD, kidney disease, liver disease, diabetes, chronic or progressive neurologic or muscular conditions, immunosuppressed, or being frail or of advanced age).  For others that do not have these risk factors, there still is some benefit from vaccination since age is one of the main risk factors for developing severe disease however baseline risk of developing severe disease and requiring hospitalization is likely to be lower compared to those that have comorbidities in addition to age.   CDC does have some information as well: [employer]  Preventive Care 79 Years and Older, Male Preventive care refers to lifestyle choices and visits with your health care provider that can promote health and wellness. Preventive care visits are also called wellness exams. What can I expect for my preventive care visit? Counseling During your preventive care visit, your health care provider may ask about your: Medical history, including: Past medical problems. Family medical history. History of falls. Current health, including: Emotional well-being. Home life and relationship  well-being. Sexual activity. Memory and ability to understand (cognition). Lifestyle, including: Alcohol, nicotine or tobacco, and drug use. Access to firearms. Diet, exercise, and sleep habits. Work and work Astronomer. Sunscreen use. Safety issues such as seatbelt and bike helmet use. Physical exam Your health care provider will check your: Height and weight. These may be used to calculate your BMI (body mass index). BMI is a measurement that tells if you are at a healthy weight. Waist circumference. This measures the distance around your waistline. This measurement also tells if you are at a healthy weight and may help predict your risk of certain diseases, such as type 2 diabetes and high blood pressure. Heart rate and blood pressure. Body temperature. Skin for abnormal spots. What immunizations do I need?  Vaccines are usually given at various ages, according to a schedule. Your health care provider will recommend vaccines for you based on your age, medical history, and lifestyle or other factors, such as travel or where you work. What tests do I need? Screening Your health care provider may recommend screening tests for certain conditions. This may include: Lipid and cholesterol levels. Diabetes screening. This is done by checking your blood sugar (glucose) after you have not eaten for a while (fasting). Hepatitis C test. Hepatitis B test. HIV (human immunodeficiency virus) test. STI (sexually transmitted infection) testing, if you are at risk. Lung cancer screening. Colorectal cancer screening. Prostate cancer screening. Abdominal aortic aneurysm (AAA) screening. You may need this if you are a current or former smoker. Talk with your health care provider about your test results, treatment options, and if necessary, the need for more tests. Follow these instructions at home: Eating and  drinking  Eat a diet that includes fresh fruits and vegetables, whole grains, lean  protein, and low-fat dairy products. Limit your intake of foods with high amounts of sugar, saturated fats, and salt. Take vitamin and mineral supplements as recommended by your health care provider. Do not drink alcohol if your health care provider tells you not to drink. If you drink alcohol: Limit how much you have to 0-2 drinks a day. Know how much alcohol is in your drink. In the [place] one drink equals one 12 oz bottle of beer (355 mL), one 5 oz glass of wine (148 mL), or one 1 oz glass of hard liquor (44 mL). Lifestyle Brush your teeth every morning and night with fluoride toothpaste. Floss one time each day. Exercise for at least 30 minutes 5 or more days each week. Do not use any products that contain nicotine or tobacco. These products include cigarettes, chewing tobacco, and vaping devices, such as e-cigarettes. If you need help quitting, ask your health care provider. Do not use drugs. If you are sexually active, practice safe sex. Use a condom or other form of protection to prevent STIs. Take aspirin only as told by your health care provider. Make sure that you understand how much to take and what form to take. Work with your health care provider to find out whether it is safe and beneficial for you to take aspirin daily. Ask your health care provider if you need to take a cholesterol-lowering medicine (statin). Find healthy ways to manage stress, such as: Meditation, yoga, or listening to music. Journaling. Talking to a trusted person. Spending time with friends and family. Safety Always wear your seat belt while driving or riding in a vehicle. Do not drive: If you have been drinking alcohol. Do not ride with someone who has been drinking. When you are tired or distracted. While texting. If you have been using any mind-altering substances or drugs. Wear a helmet and other protective equipment during sports activities. If you have firearms in your house, make sure you follow  all gun safety procedures. Minimize exposure to UV radiation to reduce your risk of skin cancer. What's next? Visit your health care provider once a year for an annual wellness visit. Ask your health care provider how often you should have your eyes and teeth checked. Stay up to date on all vaccines. This information is not intended to replace advice given to you by your health care provider. Make sure you discuss any questions you have with your health care provider. Document Revised: 11/28/2020 Document Reviewed: 11/28/2020 Elsevier Patient Education  2023 [employer].

## 2022-10-14 NOTE — Telephone Encounter (Signed)
NPSG- [employer] medicare no auth req'd   Patient is scheduled at [facility] for 12/23/2022 at 8 pm.  Mailed packet to the patient.

## 2022-10-19 ENCOUNTER — Other Ambulatory Visit: Payer: Self-pay | Admitting: Family Medicine

## 2022-10-19 DIAGNOSIS — R7989 Other specified abnormal findings of blood chemistry: Secondary | ICD-10-CM

## 2022-10-19 NOTE — Progress Notes (Signed)
See labs 

## 2022-10-20 ENCOUNTER — Telehealth: Payer: Self-pay

## 2022-10-20 NOTE — Telephone Encounter (Signed)
Pt called back and we scheduled an appt for labs.

## 2022-10-20 NOTE — Telephone Encounter (Signed)
Called to discuss no answer, LM to have him return call

## 2022-10-20 NOTE — Telephone Encounter (Signed)
-----   Message from Shade Flood, MD sent at 10/19/2022  4:27 PM EDT ----- Call patient.  Blood sugar elevated at 204.  Kidney function test elevated but improved from March 28.  Recommend we keep a close eye on that blood sugar with repeat testing in the next 6 weeks.  Let me know if there are questions

## 2022-12-02 ENCOUNTER — Other Ambulatory Visit (INDEPENDENT_AMBULATORY_CARE_PROVIDER_SITE_OTHER): Payer: Medicare Other

## 2022-12-02 DIAGNOSIS — R7989 Other specified abnormal findings of blood chemistry: Secondary | ICD-10-CM | POA: Diagnosis not present

## 2022-12-02 LAB — BASIC METABOLIC PANEL
BUN: 25 mg/dL — ABNORMAL HIGH (ref 6–23)
CO2: 23 mEq/L (ref 19–32)
Calcium: 9.5 mg/dL (ref 8.4–10.5)
Chloride: 106 mEq/L (ref 96–112)
Creatinine, Ser: 1.87 mg/dL — ABNORMAL HIGH (ref 0.40–1.50)
GFR: 33.9 mL/min — ABNORMAL LOW (ref >60.00)
Glucose, Bld: 112 mg/dL — ABNORMAL HIGH (ref 70–99)
Potassium: 4.2 mEq/L (ref 3.5–5.1)
Sodium: 139 mEq/L (ref 135–145)

## 2022-12-03 ENCOUNTER — Other Ambulatory Visit: Payer: Self-pay | Admitting: Family Medicine

## 2022-12-03 DIAGNOSIS — R7989 Other specified abnormal findings of blood chemistry: Secondary | ICD-10-CM

## 2022-12-03 NOTE — Progress Notes (Signed)
See lab notes, referral placed to nephrology.

## 2022-12-19 ENCOUNTER — Other Ambulatory Visit: Payer: Self-pay

## 2022-12-19 DIAGNOSIS — N189 Chronic kidney disease, unspecified: Secondary | ICD-10-CM

## 2022-12-19 DIAGNOSIS — R748 Abnormal levels of other serum enzymes: Secondary | ICD-10-CM

## 2022-12-23 ENCOUNTER — Ambulatory Visit (INDEPENDENT_AMBULATORY_CARE_PROVIDER_SITE_OTHER): Payer: Medicare Other | Admitting: Neurology

## 2022-12-23 DIAGNOSIS — Z72820 Sleep deprivation: Secondary | ICD-10-CM

## 2022-12-23 DIAGNOSIS — G4733 Obstructive sleep apnea (adult) (pediatric): Secondary | ICD-10-CM

## 2022-12-23 DIAGNOSIS — R351 Nocturia: Secondary | ICD-10-CM

## 2022-12-23 DIAGNOSIS — R0683 Snoring: Secondary | ICD-10-CM | POA: Diagnosis not present

## 2022-12-23 DIAGNOSIS — G4731 Primary central sleep apnea: Secondary | ICD-10-CM

## 2022-12-23 DIAGNOSIS — Z9861 Coronary angioplasty status: Secondary | ICD-10-CM

## 2022-12-23 DIAGNOSIS — R9431 Abnormal electrocardiogram [ECG] [EKG]: Secondary | ICD-10-CM

## 2022-12-23 DIAGNOSIS — E669 Obesity, unspecified: Secondary | ICD-10-CM

## 2022-12-23 DIAGNOSIS — I2510 Atherosclerotic heart disease of native coronary artery without angina pectoris: Secondary | ICD-10-CM

## 2022-12-23 DIAGNOSIS — G4719 Other hypersomnia: Secondary | ICD-10-CM

## 2022-12-23 DIAGNOSIS — G478 Other sleep disorders: Secondary | ICD-10-CM

## 2022-12-23 DIAGNOSIS — R0681 Apnea, not elsewhere classified: Secondary | ICD-10-CM

## 2022-12-23 DIAGNOSIS — G4720 Circadian rhythm sleep disorder, unspecified type: Secondary | ICD-10-CM

## 2022-12-23 DIAGNOSIS — Z9189 Other specified personal risk factors, not elsewhere classified: Secondary | ICD-10-CM

## 2022-12-24 ENCOUNTER — Other Ambulatory Visit (INDEPENDENT_AMBULATORY_CARE_PROVIDER_SITE_OTHER): Payer: Medicare Other

## 2022-12-24 DIAGNOSIS — N189 Chronic kidney disease, unspecified: Secondary | ICD-10-CM | POA: Diagnosis not present

## 2022-12-24 LAB — COMPREHENSIVE METABOLIC PANEL
ALT: 11 U/L (ref 0–53)
AST: 10 U/L (ref 0–37)
Albumin: 4 g/dL (ref 3.5–5.2)
Alkaline Phosphatase: 63 U/L (ref 39–117)
BUN: 23 mg/dL (ref 6–23)
CO2: 24 mEq/L (ref 19–32)
Calcium: 9 mg/dL (ref 8.4–10.5)
Chloride: 107 mEq/L (ref 96–112)
Creatinine, Ser: 1.7 mg/dL — ABNORMAL HIGH (ref 0.40–1.50)
GFR: 37.99 mL/min — ABNORMAL LOW (ref >60.00)
Glucose, Bld: 95 mg/dL (ref 70–99)
Potassium: 4.7 mEq/L (ref 3.5–5.1)
Sodium: 139 mEq/L (ref 135–145)
Total Bilirubin: 0.4 mg/dL (ref 0.2–1.2)
Total Protein: 6.7 g/dL (ref 6.0–8.3)

## 2022-12-27 ENCOUNTER — Other Ambulatory Visit: Payer: Self-pay | Admitting: Endocrinology

## 2022-12-29 NOTE — Procedures (Unsigned)
Physician Interpretation:    Referred by: Shade Flood, MD    History and Indication for Testing: 77 year old male with an underlying complex medical history of hyperlipidemia, hypertension, type 2 diabetes, panhypopituitarism, gout, chronic kidney disease, anemia, coronary artery disease with status post angioplasty, and obesity, who reports snoring and excessive daytime somnolence as well as frequent nocturia.  His Epworth sleepiness score is 5 out of 24, fatigue severity score is 36 out of 63.    EEG: Review of the EEG showed no abnormal electrical discharges and symmetrical bihemispheric findings.     EKG: The EKG revealed normal sinus rhythm (NSR). ***   AUDIO/VIDEO REVIEW: The audio and video review did not show any abnormal or unusual behaviors, movements, phonations or vocalizations. The patient took 2 restroom breaks. Snoring was noted, and was noted to be mild and intermittent.     POST-STUDY QUESTIONNAIRE: Post study, the patient indicated, that sleep was worse than usual.     IMPRESSION:   Severe obstructive Sleep Apnea (OSA) Dysfunctions associated with sleep stages or arousal from sleep ***Non-specific abnormal electrocardiogram (EKG) Poor sleep pattern  RECOMMENDATIONS:   This study demonstrates severe obstructive sleep apnea, with a total AHI of 65.7/hour, REM AHI of 12/hour, supine AHI of 75.4/hour and O2 nadir of 86%. Treatment with a positive airway pressure (PAP) device is highly recommended.  This study was limited secondary to poor sleep efficiency of less than 50%, poor sleep consolidation, and reduced percentage of REM sleep. The patient will be advised to proceed with home autoPAP therapy for now.  A laboratory attended, full night PAP-titration study can be considered down the road, to optimize therapy settings, mask fit, monitoring of tolerance and of proper oxygen saturations. Other treatment options may be limited, and may include (generally speaking)  surgical options in selected patients. Concomitant weight loss is recommended (where clinically feasible). Please note that untreated obstructive sleep apnea may carry additional perioperative morbidity. Patients with significant obstructive sleep apnea should receive perioperative PAP therapy and the surgeons and particularly the anesthesiologist should be informed of the diagnosis and the severity of the sleep disordered breathing. This study shows significant sleep fragmentation and abnormal sleep stage percentages; these are nonspecific findings and per se do not signify an intrinsic sleep disorder or a cause for the patient's sleep-related symptoms. Causes include (but are not limited to) the first night effect of the sleep study, circadian rhythm disturbances, medication effect or an underlying mood disorder or medical problem.  The patient should be cautioned not to drive, work at heights, or operate dangerous or heavy equipment when tired or sleepy. Review and reiteration of good sleep hygiene measures should be pursued with any patient. The patient will be seen in follow-up in the sleep clinic at [facility] for discussion of the test results, symptom and treatment compliance review, further management strategies, etc. The patient and the referring provider will be notified of the test results.    I certify that I have reviewed the entire raw data recording prior to the issuance of this report in accordance with the Standards of Accreditation of the American Academy of Sleep Medicine (AASM).   Huston Foley, MD, PhD Medical Director, [facility]) Diplomat, ABPN (Neurology and Sleep)   Technical Report:   ***

## 2022-12-31 NOTE — Addendum Note (Signed)
Addended by: Huston Foley on: 12/31/2022 12:58 PM   Modules accepted: Orders

## 2023-01-07 ENCOUNTER — Telehealth: Payer: Self-pay

## 2023-01-07 NOTE — Telephone Encounter (Signed)
Please offer FU appt in sleep clinic with me or NP to discuss treatment options and answer any questions he may have to help him decide to start treatment for his OSA.

## 2023-01-07 NOTE — Telephone Encounter (Signed)
Contacted pt, no anwswer

## 2023-01-07 NOTE — Telephone Encounter (Signed)
I called pt. I advised pt that Dr. Frances Furbish reviewed their sleep study results and found that pt has sever OSA. Dr. Frances Furbish recommends that pt starts autopap. I reviewed PAP compliance expectations with the pt and next steps. Pt asked if he could have some time to think about this. I reiterated the importance of starting PAP therapy especially since he has sever OSA. I discussed the risk factors of not moving forward with PAP such as increased risk for cardiovascular conditions, high blood pressure, stroke and fatigue. Pt verbally understood results and risk factors. He stated he will call us back once he has made a decision.  Pt declined to go into further detail as to why he wanted to wait.

## 2023-01-07 NOTE — Telephone Encounter (Signed)
-----   Message from Huston Foley sent at 12/31/2022 12:58 PM EDT ----- Patient referred by PCP for sleep apnea concern, seen by me on 08/27/22, diagnostic PSG on 12/23/22.    Please call and notify the patient that the recent sleep study showed severe obstructive sleep apnea. I recommend treatment for in the form of autoPAP. We may consider at a CPAP titration study at a later date, if need be, which means, that we would ask him to come back in for a second sleep study with CPAP treatment. For now, I would like to start him on a so-called autoPAP machine at home, through a DME company (of his choice, or as per insurance requirement). The DME representative will educate him on how to use the machine, how to put the mask on, etc. I have placed an order in the chart. Please send referral, talk to patient, send report to referring MD. We will need a FU in sleep clinic for 10 weeks post-PAP set up, please arrange that with me or one of our NPs. Thanks,   Huston Foley, MD, PhD [facility])

## 2023-01-12 NOTE — Telephone Encounter (Signed)
Pt returned call and decided to move forward with set up.  I reviewed PAP compliance expectations with the pt. Pt is agreeable to starting a CPAP. I advised pt that an order will be sent to a DME, [employer], and [employer] will call the pt within about one week after they file with the pt's insurance. [employer] will show the pt how to use the machine, fit for masks, and troubleshoot the CPAP if needed. A follow up appt was made for insurance purposes with Maralyn Sago 10/16 1245.  Pt verbalized understanding to arrive 15 minutes early and bring their CPAP. Pt verbalized understanding of results. Pt had no questions at this time but was encouraged to call back if questions arise. I have sent the order to [employer] and have received confirmation that they have received the order.

## 2023-01-12 NOTE — Telephone Encounter (Addendum)
New, Maryruth Bun, Abbe Amsterdam, CMA; Ellis Parents Eula Fried; Kathe Becton Received, thank you!       Previous Messages    ----- Message ----- From: Bobbye Morton, CMA Sent: 01/12/2023  11:42 AM EDT To: Penni Homans; Santina Evans; * Subject: autopap                                        New orders have been placed for the above pt, DOB: [id number] Thanks

## 2023-01-14 ENCOUNTER — Other Ambulatory Visit (INDEPENDENT_AMBULATORY_CARE_PROVIDER_SITE_OTHER): Payer: Medicare Other

## 2023-01-14 DIAGNOSIS — E1165 Type 2 diabetes mellitus with hyperglycemia: Secondary | ICD-10-CM

## 2023-01-14 DIAGNOSIS — Z794 Long term (current) use of insulin: Secondary | ICD-10-CM

## 2023-01-14 DIAGNOSIS — E230 Hypopituitarism: Secondary | ICD-10-CM

## 2023-01-14 LAB — BASIC METABOLIC PANEL
BUN: 22 mg/dL (ref 6–23)
CO2: 22 mEq/L (ref 19–32)
Calcium: 9 mg/dL (ref 8.4–10.5)
Chloride: 109 mEq/L (ref 96–112)
Creatinine, Ser: 1.54 mg/dL — ABNORMAL HIGH (ref 0.40–1.50)
GFR: 42.75 mL/min — ABNORMAL LOW (ref >60.00)
Glucose, Bld: 107 mg/dL — ABNORMAL HIGH (ref 70–99)
Potassium: 3.9 mEq/L (ref 3.5–5.1)
Sodium: 140 mEq/L (ref 135–145)

## 2023-01-14 LAB — T4, FREE: Free T4: 0.96 ng/dL (ref 0.60–1.60)

## 2023-01-14 LAB — HEMOGLOBIN A1C: Hgb A1c MFr Bld: 7 % — ABNORMAL HIGH (ref 4.6–6.5)

## 2023-01-20 NOTE — Progress Notes (Unsigned)
Patient ID: Carl Gutierrez, male   DOB: 01/23/1946, 77 y.o.   MRN: [id number]           Reason for Appointment: Endocrinology follow-up  History of Present Illness   DIABETES type II:  Recent history:     INSULIN regimen: Humalog 75/25 insulin, 28 units before breakfast and 8 before dinner  Non-insulin hypoglycemic drugs: Xigduo 10/998 daily  His A1c is most recently 7.2, was 7.5   Current management, blood sugar patterns and problems identified: He did not bring his monitor for download today His morning insulin was increased to 28 on the last visit because of high readings in the afternoon mostly However unclear how often he is checking his blood sugars and what they are at different times Although he thinks at times he has readings as low as 76 fasting he has no overnight hypoglycemic symptoms Also blood sugars after dinner about 160 by recall Weight is about the same He does a little walking, limited by knee pain Also trying to cut back on starchy foods like bread Previously had refused the freestyle libre sensor  Side effects from medications: None      Monitors blood glucose:  Once or twice a day.    Glucometer:  One Touch           Blood Glucose readings from recall   PRE-MEAL Fasting Lunch Dinner Bedtime Overall  Glucose range:  76-120  150-+    Mean/median:        POST-MEAL PC Breakfast PC Lunch PC Dinner  Glucose range:   160  Mean/median:      Previously from download:  PRE-MEAL Fasting Lunch Dinner Bedtime Overall  Glucose range:   119-183    Mean/median:   150  /150   POST-MEAL PC Breakfast PC Lunch PC Dinner  Glucose range: 168  134-180  Mean/median:      Dietician visit: Most recent: 11/2012     Weight history   Wt Readings from Last 3 Encounters:  10/13/22 226 lb 3.2 oz (102.6 kg)  09/16/22 224 lb (101.6 kg)  08/27/22 225 lb 9.6 oz (102.3 kg)           Diabetes labs:  Lab Results  Component Value Date   HGBA1C 7.0 (H) 01/14/2023    HGBA1C 7.2 (H) 09/11/2022   HGBA1C 7.5 (H) 05/06/2022   Lab Results  Component Value Date   MICROALBUR 7.2 (H) 09/11/2022   LDLCALC 54 07/29/2022   CREATININE 1.54 (H) 01/14/2023   Other problems discussed today: See review of systems  Allergies as of 01/21/2023       Reactions   Hydrochlorothiazide    Leg and side pain with this   Other    Blood pressure med name unknown.        Medication List        Accurate as of January 20, 2023  9:08 PM. If you have any questions, ask your nurse or doctor.          amLODipine 10 MG tablet Commonly known as: NORVASC Take 1 tablet (10 mg total) by mouth daily.   aspirin 81 MG tablet Take 81 mg by mouth daily. Reported on 09/05/2015   B-D UF III MINI PEN NEEDLES 31G X 5 MM Misc Generic drug: Insulin Pen Needle USE FOR INSULIN PEN TWICE A DAY   Insulin Lispro Prot & Lispro (75-25) 100 UNIT/ML Kwikpen Commonly known as: HUMALOG 75/25 MIX INJECT 20 UNITS UNDER THE SKIN BEFORE  BREAKFAST AND 6 UNITS BEFORE SUPPER. (REPLACES 70/30) What changed: additional instructions   levothyroxine 88 MCG tablet Commonly known as: SYNTHROID Take 1 tablet (88 mcg total) by mouth daily.   metoprolol succinate 25 MG 24 hr tablet Commonly known as: TOPROL-XL Take 1 tablet (25 mg total) by mouth daily.   nitroGLYCERIN 0.4 MG SL tablet Commonly known as: NITROSTAT Place 1 tablet (0.4 mg total) under the tongue every 5 (five) minutes as needed for chest pain.   OneTouch Verio test strip Generic drug: glucose blood USE AS INSTRUCTED TO CHECK BLOOD SUGAR 2X DAILY.   Pharmacist Choice Alcohol Pads SMARTSIG:1 Each Topical 4 Times Daily   rosuvastatin 40 MG tablet Commonly known as: CRESTOR Take 1 tablet (40 mg total) by mouth daily.   Xigduo XR 10-998 MG Tb24 Generic drug: Dapagliflozin Pro-metFORMIN ER TAKE 1 TABLET BY MOUTH EVERY DAY        Allergies:  Allergies  Allergen Reactions   Hydrochlorothiazide     Leg and side pain with  this   Other     Blood pressure med name unknown.    Past Medical History:  Diagnosis Date   CAD S/P percutaneous coronary angioplasty February 2004; 2008   PCI-dLAD: 2.25 mm x 16 mm Express BMS; Ramus- 2.75 mm x 18 mm Cypher DES (2.8 mm); follow-up 11/'08: Patent LAD/ramus stents. 90% small OM. EF 50-60%.; Myoview 1/'12: Exercise 9 min, 10 METS with no ischemia/infarction.    Clotting disorder (HCC)    Diabetes mellitus    Low-dose metformin   DJD (degenerative joint disease), lumbosacral     status post L4-L5 surgery   Essential hypertension    History of DVT of lower extremity     postoperative   Hyperlipidemia with target LDL less than 70    Hypothyroidism    Synthroid   Panhypopituitarism (HCC)    Status post pituitary adenoma removal in 2012   Recurrent deep vein thrombosis (DVT) (HCC) 10/01/2015    Past Surgical History:  Procedure Laterality Date   ARTHROSCOPY KNEE W/ DRILLING Left    BRAIN SURGERY  06/16/2010   Removal of pituitary adenoma   CARDIAC CATHETERIZATION  04/17/2007   Significant LAD tapering but no significant disease the distal stent. Patent Ramus stent. 90% lesion in small OM 2; small nondominant RCA. Medical therapy. EF 50-60%.   CORONARY ANGIOPLASTY WITH STENT PLACEMENT  07/17/2002   PCI-dLAD: 2.25 mm x 16 mm BMS;;PCI- RI: 2.75 mm 18 mm Cypher DES   IR RADIOLOGIST EVAL & MGMT  11/04/2021   IR RADIOLOGIST EVAL & MGMT  12/31/2021   IR RADIOLOGIST EVAL & MGMT  02/27/2022   NM MYOVIEW LTD  06/16/2010   a) Exercise ~9 min - 10 METS; no ischemia or infarction; b) 06/2021: LOW RISK.  Normal LV size and function.  EF 55 to 60%.  No RWMA.  No ischemia or infarct.  Fixed inferior basal apical defect with normal motion suggestive of artifact.  (Diaphragmatic attenuation)   PROSTATECTOMY N/A 08/13/2015   Procedure: TRANSVESICLE OPEN PROSTATECTOMY** ;  Surgeon: Jethro Bolus, MD;  Location: [facility];  Service: Urology;  Laterality: N/A;   RADIOLOGY WITH ANESTHESIA  N/A 12/04/2021   Procedure: LEFT RENAL CRYO ABLATION;  Surgeon: Irish Lack, MD;  Location: [facility];  Service: Radiology;  Laterality: N/A;   SPINE SURGERY     L4-L5    Family History  Problem Relation Age of Onset   Cancer Mother  pancreatic ca   Sleep apnea Neg Hx     Social History:  reports that he quit smoking about 29 years ago. His smoking use included cigarettes. He has never used smokeless tobacco. He reports that he does not drink alcohol and does not use drugs.  Review of Systems:  No further recurrence of gout, treated with colchicine in 5/22 Not on allopurinol   Lab Results  Component Value Date   LABURIC 8.0 (H) 01/24/2021     Hypertension:  He is taking 10 mg amlodipine  amlodipine prescribed by his cardiologist   BP Readings from Last 3 Encounters:  10/13/22 136/64  09/16/22 132/68  08/27/22 112/66   Renal function history:   Creatinine is relatively higher again  [facility]  Lab Results  Component Value Date   CREATININE 1.54 (H) 01/14/2023   CREATININE 1.70 (H) 12/24/2022   CREATININE 1.87 (H) 12/02/2022    HYPOPITUITARISM: He has been taking levothyroxine and Cortef as prescribed Takes Cortef 20 mg in the morning and 10 mg in the late afternoon Not clear if he is taking this as the pharmacy data does not show any refills but he says he is taking it  Hypothyroidism:  Free T4 has been consistently normal with  dosage of 88 mcg levothyroxine  Lab Results  Component Value Date   TSH 4.34 07/29/2022   TSH 2.38 12/23/2021   TSH 4.57 04/19/2021   FREET4 0.96 01/14/2023   FREET4 0.93 09/11/2022   FREET4 0.96 01/02/2022    [facility]   Hypercholesterolemia: Treated with rosuvastatin by PCP  Lab Results  Component Value Date   CHOL 110 07/29/2022   HDL 40.10 07/29/2022   LDLCALC 54 07/29/2022   LDLDIRECT 85.0  05/18/2014   TRIG 79.0 07/29/2022   CHOLHDL 3 07/29/2022        LABS:  No visits with results within 1 Week(s) from this visit.  Latest known visit with results is:  Lab on 01/14/2023  Component Date Value Ref Range Status   Free T4 01/14/2023 0.96  0.60 - 1.60 ng/dL Final   Comment: Specimens from patients who are undergoing biotin therapy and /or ingesting biotin supplements may contain high levels of biotin.  The higher biotin concentration in these specimens interferes with this Free T4 assay.  Specimens that contain high levels  of biotin may cause false high results for this Free T4 assay.  Please interpret results in light of the total clinical presentation of the patient.     Sodium 01/14/2023 140  135 - 145 mEq/L Final   Potassium 01/14/2023 3.9  3.5 - 5.1 mEq/L Final   Chloride 01/14/2023 109  96 - 112 mEq/L Final   CO2 01/14/2023 22  19 - 32 mEq/L Final   Glucose, Bld 01/14/2023 107 (H)  70 - 99 mg/dL Final   BUN 41/32/4401 22  6 - 23 mg/dL Final   Creatinine, Ser 01/14/2023 1.54 (H)  0.40 - 1.50 mg/dL Final   GFR 02/72/5366 42.75 (L)  >60.00 mL/min Final   Calculated using the CKD-EPI Creatinine Equation (2021)   Calcium 01/14/2023 9.0  8.4 - 10.5 mg/dL Final   Hgb Y4I MFr Bld 01/14/2023 7.0 (H)  4.6 - 6.5 % Final   Glycemic Control Guidelines for People with Diabetes:Non Diabetic:  <6%Goal of Therapy: <7%Additional Action Suggested:  >8%      Examination:  There were no vitals taken for this visit.  There is no height or weight on file to calculate BMI.   Exam foot   ASSESSMENT/ PLAN:    Diabetes type 2 with mild obesity:   See history of present illness for detailed discussion of current diabetes management, blood sugar patterns and problems identified  A1c is 7.2 compared to 7.5  Blood sugars not assessed because he did not bring his monitor  He is on premixed insulin twice a day and Xigduo   By recall his blood sugars are fairly good after meals but  blood sugars may be occasionally low normal fasting but not clear how often, lab glucose was 99 He has maintained his weight Likely has done relatively well on his diet as A1c is improving Level of control is somewhat limited because of using premixed insulin   RENAL dysfunction: Relatively worse and no clear factors identified Discussed that labs will be forwarded to PCP and if creatinine consistently high may consider nephrology consultation  Hypertension: Blood pressure is normal with no orthostatic drop  History of pituitary tumor: Thyroid levels are quite normal and he will continue 88 mcg levothyroxine Unclear again whether he is taking his Cortef but not symptomatic and reminded him that he needs to call us to let us know if he still has this at home, if he is not getting this will leave it off  Continue same dose of levothyroxine as free T4 is fairly good Hypogonadism: He has refused to consider supplementation and did not appear very symptomatic again  PLAN: He needs to bring his monitor for download on each visit Assuming that he does not have any consistently high readings will not increase his doses as yet Consistently after meals Watch carbohydrate foods Discussed targets both before and after meals  There are no Patient Instructions on file for this visit.     Reather Littler 01/20/2023, 9:08 PM

## 2023-01-21 ENCOUNTER — Encounter: Payer: Self-pay | Admitting: Endocrinology

## 2023-01-21 ENCOUNTER — Ambulatory Visit: Payer: Medicare Other | Admitting: Endocrinology

## 2023-01-21 VITALS — BP 132/80 | HR 88 | Ht 69.0 in | Wt 224.0 lb

## 2023-01-21 DIAGNOSIS — E1165 Type 2 diabetes mellitus with hyperglycemia: Secondary | ICD-10-CM

## 2023-01-21 DIAGNOSIS — E230 Hypopituitarism: Secondary | ICD-10-CM | POA: Diagnosis not present

## 2023-01-21 DIAGNOSIS — N1831 Chronic kidney disease, stage 3a: Secondary | ICD-10-CM

## 2023-01-21 DIAGNOSIS — Z794 Long term (current) use of insulin: Secondary | ICD-10-CM | POA: Diagnosis not present

## 2023-01-21 NOTE — Patient Instructions (Signed)
Check blood sugars on waking up 2-3 days a week  Also check blood sugars about 2 hours after meals and do this after different meals by rotation  Recommended blood sugar levels on waking up are 90-130 and about 2 hours after meal is 130-160  Please bring your blood sugar monitor to each visit, thank you   

## 2023-01-27 DIAGNOSIS — G4733 Obstructive sleep apnea (adult) (pediatric): Secondary | ICD-10-CM | POA: Diagnosis not present

## 2023-02-19 DIAGNOSIS — N1831 Chronic kidney disease, stage 3a: Secondary | ICD-10-CM | POA: Diagnosis not present

## 2023-02-19 DIAGNOSIS — N1832 Chronic kidney disease, stage 3b: Secondary | ICD-10-CM | POA: Diagnosis not present

## 2023-02-19 DIAGNOSIS — I129 Hypertensive chronic kidney disease with stage 1 through stage 4 chronic kidney disease, or unspecified chronic kidney disease: Secondary | ICD-10-CM | POA: Diagnosis not present

## 2023-02-19 DIAGNOSIS — N2889 Other specified disorders of kidney and ureter: Secondary | ICD-10-CM | POA: Diagnosis not present

## 2023-02-19 DIAGNOSIS — E785 Hyperlipidemia, unspecified: Secondary | ICD-10-CM | POA: Diagnosis not present

## 2023-02-19 DIAGNOSIS — E1122 Type 2 diabetes mellitus with diabetic chronic kidney disease: Secondary | ICD-10-CM | POA: Diagnosis not present

## 2023-02-20 ENCOUNTER — Other Ambulatory Visit: Payer: Self-pay | Admitting: Family Medicine

## 2023-02-20 DIAGNOSIS — E039 Hypothyroidism, unspecified: Secondary | ICD-10-CM

## 2023-02-20 LAB — LAB REPORT - SCANNED
Creatinine, POC: 62.1 mg/dL
EGFR: 46

## 2023-02-27 ENCOUNTER — Other Ambulatory Visit: Payer: Self-pay | Admitting: Interventional Radiology

## 2023-02-27 DIAGNOSIS — C642 Malignant neoplasm of left kidney, except renal pelvis: Secondary | ICD-10-CM

## 2023-03-03 ENCOUNTER — Ambulatory Visit (HOSPITAL_COMMUNITY)
Admission: RE | Admit: 2023-03-03 | Discharge: 2023-03-03 | Disposition: A | Discharge status: Home / Self Care | Payer: Medicare Other | Source: Ambulatory Visit | Attending: Interventional Radiology | Admitting: Interventional Radiology

## 2023-03-03 DIAGNOSIS — I700 Atherosclerosis of aorta: Secondary | ICD-10-CM | POA: Diagnosis not present

## 2023-03-03 DIAGNOSIS — C642 Malignant neoplasm of left kidney, except renal pelvis: Secondary | ICD-10-CM

## 2023-03-03 DIAGNOSIS — N280 Ischemia and infarction of kidney: Secondary | ICD-10-CM | POA: Diagnosis not present

## 2023-03-03 DIAGNOSIS — Z9889 Other specified postprocedural states: Secondary | ICD-10-CM | POA: Diagnosis not present

## 2023-03-03 MED ORDER — GADOBUTROL 1 MMOL/ML IV SOLN
10.0000 mL | Freq: Once | INTRAVENOUS | Status: AC | PRN
  Administered 2023-03-03: 10 mL via INTRAVENOUS

## 2023-03-09 ENCOUNTER — Ambulatory Visit
Admission: RE | Admit: 2023-03-09 | Discharge: 2023-03-09 | Disposition: A | Discharge status: Home / Self Care | Payer: Medicare Other | Source: Ambulatory Visit | Attending: Interventional Radiology | Admitting: Interventional Radiology

## 2023-03-09 DIAGNOSIS — C642 Malignant neoplasm of left kidney, except renal pelvis: Secondary | ICD-10-CM

## 2023-03-09 DIAGNOSIS — C652 Malignant neoplasm of left renal pelvis: Secondary | ICD-10-CM | POA: Diagnosis not present

## 2023-03-09 HISTORY — PX: IR RADIOLOGIST EVAL & MGMT: IMG5224

## 2023-03-09 NOTE — Progress Notes (Signed)
Chief Complaint: Patient was consulted remotely today (TeleHealth) for follow up after cryoablation of a left renal carcinoma.   History of Present Illness: Carl Gutierrez is a 77 y.o. male status post cryoablation of a 1.8 cm left posterior lower pole biopsy-proven clear-cell renal carcinoma on 12/04/2021. He is currently asymptomatic and denies any pain or urinary symptoms. A follow up MRI of the abdomen was performed on 03/03/23.   Past Medical History:  Diagnosis Date   CAD S/P percutaneous coronary angioplasty February 2004; 2008   PCI-dLAD: 2.25 mm x 16 mm Express BMS; Ramus- 2.75 mm x 18 mm Cypher DES (2.8 mm); follow-up 11/'08: Patent LAD/ramus stents. 90% small OM. EF 50-60%.; Myoview 1/'12: Exercise 9 min, 10 METS with no ischemia/infarction.    Clotting disorder (HCC)    Diabetes mellitus    Low-dose metformin   DJD (degenerative joint disease), lumbosacral     status post L4-L5 surgery   Essential hypertension    History of DVT of lower extremity     postoperative   Hyperlipidemia with target LDL less than 70    Hypothyroidism    Synthroid   Panhypopituitarism (HCC)    Status post pituitary adenoma removal in 2012   Recurrent deep vein thrombosis (DVT) (HCC) 10/01/2015    Past Surgical History:  Procedure Laterality Date   ARTHROSCOPY KNEE W/ DRILLING Left    BRAIN SURGERY  06/16/2010   Removal of pituitary adenoma   CARDIAC CATHETERIZATION  04/17/2007   Significant LAD tapering but no significant disease the distal stent. Patent Ramus stent. 90% lesion in small OM 2; small nondominant RCA. Medical therapy. EF 50-60%.   CORONARY ANGIOPLASTY WITH STENT PLACEMENT  07/17/2002   PCI-dLAD: 2.25 mm x 16 mm BMS;;PCI- RI: 2.75 mm 18 mm Cypher DES   IR RADIOLOGIST EVAL & MGMT  11/04/2021   IR RADIOLOGIST EVAL & MGMT  12/31/2021   IR RADIOLOGIST EVAL & MGMT  02/27/2022   NM MYOVIEW LTD  06/16/2010   a) Exercise ~9 min - 10 METS; no ischemia or infarction; b) 06/2021: LOW  RISK.  Normal LV size and function.  EF 55 to 60%.  No RWMA.  No ischemia or infarct.  Fixed inferior basal apical defect with normal motion suggestive of artifact.  (Diaphragmatic attenuation)   PROSTATECTOMY N/A 08/13/2015   Procedure: TRANSVESICLE OPEN PROSTATECTOMY** ;  Surgeon: Jethro Bolus, MD;  Location: [facility];  Service: Urology;  Laterality: N/A;   RADIOLOGY WITH ANESTHESIA N/A 12/04/2021   Procedure: LEFT RENAL CRYO ABLATION;  Surgeon: Irish Lack, MD;  Location: [facility];  Service: Radiology;  Laterality: N/A;   SPINE SURGERY     L4-L5    Allergies: Hydrochlorothiazide and Other  Medications: Prior to Admission medications   Medication Sig Start Date End Date Taking? Authorizing Provider  Alcohol Swabs (PHARMACIST CHOICE ALCOHOL) PADS SMARTSIG:1 Each Topical 4 Times Daily 07/17/21   [provider]  amLODipine (NORVASC) 10 MG tablet Take 1 tablet (10 mg total) by mouth daily. 10/13/22   Shade Flood, MD  aspirin 81 MG tablet Take 81 mg by mouth daily. Reported on 09/05/2015    [provider]  B-D UF III MINI PEN NEEDLES 31G X 5 MM MISC USE FOR INSULIN PEN TWICE A DAY 04/29/21   Reather Littler, MD  glucose blood (ONETOUCH VERIO) test strip USE AS INSTRUCTED TO CHECK BLOOD SUGAR 2X DAILY. 10/05/22   Reather Littler, MD  hydrocortisone (CORTEF) 20 MG tablet Take 30 mg  by mouth daily. Take 1 tablet in the morning and half tablet at 5 PM 02/20/22   [provider]  Insulin Lispro Prot & Lispro (HUMALOG 75/25 MIX) (75-25) 100 UNIT/ML Kwikpen INJECT 20 UNITS UNDER THE SKIN BEFORE BREAKFAST AND 6 UNITS BEFORE SUPPER. (REPLACES 70/30) Patient taking differently: INJECT 20 UNITS UNDER THE SKIN BEFORE BREAKFAST AND 8 UNITS BEFORE SUPPER. (REPLACES 70/30) 06/23/22   Reather Littler, MD  levothyroxine (SYNTHROID) 88 MCG tablet TAKE 1 TABLET BY MOUTH EVERY DAY 02/20/23   Shade Flood, MD  metoprolol succinate (TOPROL-XL) 25 MG 24 hr tablet Take 1 tablet (25 mg total) by  mouth daily. 10/13/22   Shade Flood, MD  nitroGLYCERIN (NITROSTAT) 0.4 MG SL tablet Place 1 tablet (0.4 mg total) under the tongue every 5 (five) minutes as needed for chest pain. 07/01/21 08/07/22  [place], Peter M, MD  rosuvastatin (CRESTOR) 40 MG tablet Take 1 tablet (40 mg total) by mouth daily. 10/13/22   Shade Flood, MD  XIGDUO XR 10-998 MG TB24 TAKE 1 TABLET BY MOUTH EVERY DAY 12/29/22   Reather Littler, MD     Family History  Problem Relation Age of Onset   Cancer Mother        pancreatic ca   Sleep apnea Neg Hx     Social History   Socioeconomic History   Marital status: Married    Spouse name: Not on file   Number of children: Not on file   Years of education: Not on file   Highest education level: Not on file  Occupational History   Not on file  Tobacco Use   Smoking status: Former    Current packs/day: 0.00    Types: Cigarettes    Quit date: 06/24/1993    Years since quitting: 29.7   Smokeless tobacco: Never  Vaping Use   Vaping status: Never Used  Substance and Sexual Activity   Alcohol use: No   Drug use: No   Sexual activity: Not Currently    Comment: Local truck driver, 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 Exercises only occasionally. Not as much as he desires 2. Works as a Arts administrator.   He does not smoke cigarettes -- quit in 1995. Does not drink alcohol.   Social Determinants of Health   Financial Resource Strain: Low Risk  (10/02/2022)   Overall Financial Resource Strain (CARDIA)    Difficulty of Paying Living Expenses: Not very hard  Food Insecurity: No Food Insecurity (10/02/2022)   Hunger Vital Sign    Worried About Running Out of Food in the Last Year: Never true    Ran Out of Food in the Last Year: Never true  Transportation Needs: No Transportation Needs (10/02/2022)   PRAPARE - Administrator, Civil Service  (Medical): No    Lack of Transportation (Non-Medical): No  Physical Activity: Inactive (10/02/2022)   Exercise Vital Sign    Days of Exercise per Week: 0 days    Minutes of Exercise per Session: 0 min  Stress: No Stress Concern Present (10/02/2022)   [employer] of Occupational Health - Occupational Stress Questionnaire    Feeling of Stress : Not at all  Social Connections: Moderately Integrated (10/02/2022)   Social Connection and Isolation Panel [NHANES]    Frequency of Communication with Friends and Family:  Three times a week    Frequency of Social Gatherings with Friends and Family: Twice a week    Attends Religious Services: More than 4 times per year    Active Member of [employer] or Organizations: No    Attends Banker Meetings: Never    Marital Status: Married    ECOG Status: 0 - Asymptomatic  Review of Systems  Review of Systems: A 12 point ROS discussed and pertinent positives are indicated in the HPI above.  All other systems are negative   Physical Exam No direct physical exam was performed (except for noted visual exam findings with Video Visits).   Vital Signs: There were no vitals taken for this visit.  Imaging: No results found.  Labs:  CBC: Recent Labs    07/29/22 0921  WBC 10.0  HGB 12.5*  HCT 39.3  PLT 385.0    COAGS: No results for input(s): "INR", "APTT" in the last 8760 hours.  BMP: Recent Labs    10/13/22 1347 12/02/22 0756 12/24/22 0812 01/14/23 0819  NA 139 139 139 140  K 4.0 4.2 4.7 3.9  CL 106 106 107 109  CO2 24 23 24 22   GLUCOSE 204* 112* 95 107*  BUN 21 25* 23 22  CALCIUM 9.2 9.5 9.0 9.0  CREATININE 1.61* 1.87* 1.70* 1.54*    LIVER FUNCTION TESTS: Recent Labs    07/29/22 0921 09/11/22 0751 12/24/22 0812  BILITOT 0.3 0.3 0.4  AST 14 12 10   ALT 15 11 11   ALKPHOS 85 72 63  PROT 7.1 7.1 6.7  ALBUMIN 4.1 4.1 4.0    Assessment and Plan:  I spoke to Carl Gutierrez and his wife over the phone. I reviewed  the follow up MRI of 03/03/23 personally, which shows retraction of all of the post treatment changes seen previously with no evidence of residual enhancing tumor. The study has not yet been interpreted by our Body imagers and I will await the final report. I recommended another follow up MRI in one year.   Electronically Signed: Reola Calkins 03/09/2023, 10:08 AM    I spent a total of 10 Minutes in remote  clinical consultation, greater than 50% of which was counseling/coordinating care post ablation of a left renal carcinoma.    Visit type: Audio only (telephone). Audio (no video) only due to patient's lack of internet/smartphone capability. Alternative for in-person consultation at [facility], [address] [place], [place], [place]. This visit type was conducted due to national recommendations for restrictions regarding the COVID-19 Pandemic (e.g. social distancing).  This format is felt to be most appropriate for this patient at this time.  All issues noted in this document were discussed and addressed.

## 2023-03-16 DIAGNOSIS — C642 Malignant neoplasm of left kidney, except renal pelvis: Secondary | ICD-10-CM | POA: Diagnosis not present

## 2023-03-31 NOTE — Progress Notes (Unsigned)
Patient: Carl Gutierrez Date of Birth: 1946-06-13  Reason for Visit: Sleep follow up History from: Patient Primary Neurologist: Frances Furbish  ASSESSMENT AND PLAN 77 y.o. year old male   1.  OSA on CPAP  -AHI 17.5, central 13.0, subjectively no benefit from CPAP -Reduce pressure from 5-12 cm down to 5-11 cm, reduce EPR level from 3 down to 1 -Go ahead and order in lab titration study, possible switch to BiPAP given centrals  -Will follow-up in 2 to 3 weeks to see how the above changes have done, I sent myself a reminder  -Encouraged to continue nightly usage, minimum 4 hours  Orders Placed This Encounter  Procedures   Cpap titration   HISTORY OF PRESENT ILLNESS: Today 04/01/23 Saw Dr. Frances Furbish in March 2024 for snoring, excessive daytime somnolence, frequent nocturia.  PSG 12/23/2022 showed severe OSA.total AHI of 65.9/hour, and O2 nadir of 86%. There was a moderate central sleep apnea component as well. He does not like CPAP, goes to bed at 11 PM, wakes him up at 3 AM, blowing air, up to his eyes. Using FFM mask. Has not noted any difference in his sleep or daytime energy since wearing CPAP.  CPAP download 01/26/2023-03/31/23 shows 100% compliance, greater than 4 hours 97%.  Average usage 6 hours 44 minutes. 5-12 cm.  EPR level 3.  Pressure 95th percentile 9.8, max 10.7.  Leak 18.9.  AHI 17.5 (central 13, obstructive 1.3, unknown 0.8).  ESS 10.  HISTORY  08/27/22 Dr. Frances Furbish: I saw your patient, Carl Gutierrez, upon your kind request in my sleep clinic today for initial consultation of his sleep disorder, in particular, concern for underlying obstructive sleep apnea.  The patient is accompanied by his wife today.  As you know, Carl Gutierrez is a 77 year old male with an underlying complex medical history of hyperlipidemia, hypertension, type 2 diabetes, panhypopituitarism, gout, chronic kidney disease, anemia, coronary artery disease with status post angioplasty, and obesity, who reports snoring and excessive  daytime somnolence as well as frequent nocturia.  His Epworth sleepiness score is 5 out of 24, fatigue severity score is 36 out of 63.  He does not take any actual scheduled naps but wife reports that he does have a tendency to doze off when sedentary such as watching TV.  He goes to bed generally between 8 and 9 but does not go to sleep until later, may be closer to 11 or 11:30 PM.  He watches TV while in bed, wife turns it off at night.  He does have nocturia about 5 or 6 times per average night.  His wife reports that he does have pauses in his breathing while asleep and sometimes she has to check on him.  She does not believe he wakes up fully rested and feels that he is underestimating his daytime somnolence.  I reviewed your office note from 07/29/2022.  He drinks no daily caffeine, he does not drink any alcohol currently.  He quit smoking over 10 years ago.  His weight has been more or less stable.  He lives with his wife, they have 2 grown children, no pets in the household.  He denies recurrent nocturnal or morning headaches.   REVIEW OF SYSTEMS: Out of a complete 14 system review of symptoms, the patient complains only of the following symptoms, and all other reviewed systems are negative.  See HPI  ALLERGIES: Allergies  Allergen Reactions   Hydrochlorothiazide     Leg and side pain with this   Other  Blood pressure med name unknown.    HOME MEDICATIONS: Outpatient Medications Prior to Visit  Medication Sig Dispense Refill   Alcohol Swabs (PHARMACIST CHOICE ALCOHOL) PADS SMARTSIG:1 Each Topical 4 Times Daily     amLODipine (NORVASC) 10 MG tablet Take 1 tablet (10 mg total) by mouth daily. 90 tablet 1   aspirin 81 MG tablet Take 81 mg by mouth daily. Reported on 09/05/2015     B-D UF III MINI PEN NEEDLES 31G X 5 MM MISC USE FOR INSULIN PEN TWICE A DAY 100 each 7   glucose blood (ONETOUCH VERIO) test strip USE AS INSTRUCTED TO CHECK BLOOD SUGAR 2X DAILY. 50 strip 5   hydrocortisone  (CORTEF) 20 MG tablet Take 30 mg by mouth daily. Take 1 tablet in the morning and half tablet at 5 PM     Insulin Lispro Prot & Lispro (HUMALOG 75/25 MIX) (75-25) 100 UNIT/ML Kwikpen INJECT 20 UNITS UNDER THE SKIN BEFORE BREAKFAST AND 6 UNITS BEFORE SUPPER. (REPLACES 70/30) 15 mL 2   levothyroxine (SYNTHROID) 88 MCG tablet TAKE 1 TABLET BY MOUTH EVERY DAY 90 tablet 1   metoprolol succinate (TOPROL-XL) 25 MG 24 hr tablet Take 1 tablet (25 mg total) by mouth daily. 90 tablet 1   XIGDUO XR 10-998 MG TB24 TAKE 1 TABLET BY MOUTH EVERY DAY 30 tablet 3   nitroGLYCERIN (NITROSTAT) 0.4 MG SL tablet Place 1 tablet (0.4 mg total) under the tongue every 5 (five) minutes as needed for chest pain. 90 tablet 3   rosuvastatin (CRESTOR) 40 MG tablet Take 1 tablet (40 mg total) by mouth daily. (Patient not taking: Reported on 04/01/2023) 90 tablet 1   No facility-administered medications prior to visit.    PAST MEDICAL HISTORY: Past Medical History:  Diagnosis Date   CAD S/P percutaneous coronary angioplasty February 2004; 2008   PCI-dLAD: 2.25 mm x 16 mm Express BMS; Ramus- 2.75 mm x 18 mm Cypher DES (2.8 mm); follow-up 11/'08: Patent LAD/ramus stents. 90% small OM. EF 50-60%.; Myoview 1/'12: Exercise 9 min, 10 METS with no ischemia/infarction.    Clotting disorder (HCC)    Diabetes mellitus    Low-dose metformin   DJD (degenerative joint disease), lumbosacral     status post L4-L5 surgery   Essential hypertension    History of DVT of lower extremity     postoperative   Hyperlipidemia with target LDL less than 70    Hypothyroidism    Synthroid   Panhypopituitarism (HCC)    Status post pituitary adenoma removal in 2012   Recurrent deep vein thrombosis (DVT) (HCC) 10/01/2015    PAST SURGICAL HISTORY: Past Surgical History:  Procedure Laterality Date   ARTHROSCOPY KNEE W/ DRILLING Left    BRAIN SURGERY  06/16/2010   Removal of pituitary adenoma   CARDIAC CATHETERIZATION  04/17/2007   Significant  LAD tapering but no significant disease the distal stent. Patent Ramus stent. 90% lesion in small OM 2; small nondominant RCA. Medical therapy. EF 50-60%.   CORONARY ANGIOPLASTY WITH STENT PLACEMENT  07/17/2002   PCI-dLAD: 2.25 mm x 16 mm BMS;;PCI- RI: 2.75 mm 18 mm Cypher DES   IR RADIOLOGIST EVAL & MGMT  11/04/2021   IR RADIOLOGIST EVAL & MGMT  12/31/2021   IR RADIOLOGIST EVAL & MGMT  02/27/2022   IR RADIOLOGIST EVAL & MGMT  03/09/2023   NM MYOVIEW LTD  06/16/2010   a) Exercise ~9 min - 10 METS; no ischemia or infarction; b) 06/2021: LOW RISK.  Normal LV  size and function.  EF 55 to 60%.  No RWMA.  No ischemia or infarct.  Fixed inferior basal apical defect with normal motion suggestive of artifact.  (Diaphragmatic attenuation)   PROSTATECTOMY N/A 08/13/2015   Procedure: TRANSVESICLE OPEN PROSTATECTOMY** ;  Surgeon: Jethro Bolus, MD;  Location: [facility];  Service: Urology;  Laterality: N/A;   RADIOLOGY WITH ANESTHESIA N/A 12/04/2021   Procedure: LEFT RENAL CRYO ABLATION;  Surgeon: Irish Lack, MD;  Location: [facility];  Service: Radiology;  Laterality: N/A;   SPINE SURGERY     L4-L5    FAMILY HISTORY: Family History  Problem Relation Age of Onset   Cancer Mother        pancreatic ca   Sleep apnea Neg Hx     SOCIAL HISTORY: Social History   Socioeconomic History   Marital status: Married    Spouse name: Not on file   Number of children: Not on file   Years of education: Not on file   Highest education level: Not on file  Occupational History   Not on file  Tobacco Use   Smoking status: Former    Current packs/day: 0.00    Types: Cigarettes    Quit date: 06/24/1993    Years since quitting: 29.7   Smokeless tobacco: Never  Vaping Use   Vaping status: Never Used  Substance and Sexual Activity   Alcohol use: No   Drug use: No   Sexual activity: Not Currently    Comment: Local truck driver, 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 Exercises only occasionally. Not as much as he desires 2. Works as a Arts administrator.   He does not smoke cigarettes -- quit in 1995. Does not drink alcohol.   Social Determinants of Health   Financial Resource Strain: Low Risk  (10/02/2022)   Overall Financial Resource Strain (CARDIA)    Difficulty of Paying Living Expenses: Not very hard  Food Insecurity: No Food Insecurity (10/02/2022)   Hunger Vital Sign    Worried About Running Out of Food in the Last Year: Never true    Ran Out of Food in the Last Year: Never true  Transportation Needs: No Transportation Needs (10/02/2022)   PRAPARE - Administrator, Civil Service (Medical): No    Lack of Transportation (Non-Medical): No  Physical Activity: Inactive (10/02/2022)   Exercise Vital Sign    Days of Exercise per Week: 0 days    Minutes of Exercise per Session: 0 min  Stress: No Stress Concern Present (10/02/2022)   [employer] of Occupational Health - Occupational Stress Questionnaire    Feeling of Stress : Not at all  Social Connections: Moderately Integrated (10/02/2022)   Social Connection and Isolation Panel [NHANES]    Frequency of Communication with Friends and Family: Three times a week    Frequency of Social Gatherings with Friends and Family: Twice a week    Attends Religious Services: More than 4 times per year    Active Member of [employer] or Organizations: No    Attends Banker Meetings: Never    Marital Status: Married  Catering manager Violence: Not At Risk (10/02/2022)   Humiliation, Afraid, Rape, and Kick questionnaire    Fear of Current or Ex-Partner: No    Emotionally Abused: No    Physically Abused: No  Sexually Abused: No   PHYSICAL EXAM  Vitals:   04/01/23 1244  BP: (!) 140/88  Weight: 225 lb (102.1 kg)  Height: 5\' 8"  (1.727 m)   Body mass index is 34.21 kg/m.  Generalized: Well developed, in no  acute distress  Neurological examination  Mentation: Alert oriented to time, place, history taking. Follows all commands speech and language fluent Motor: Moves all extremities independent Gait and station: Gait is normal.   DIAGNOSTIC DATA (LABS, IMAGING, TESTING) - I reviewed patient records, labs, notes, testing and imaging myself where available.  Lab Results  Component Value Date   WBC 10.0 07/29/2022   HGB 12.5 (L) 07/29/2022   HCT 39.3 07/29/2022   MCV 82.8 07/29/2022   PLT 385.0 07/29/2022      Component Value Date/Time   NA 140 01/14/2023 0819   NA 140 11/28/2019 0937   NA 140 11/30/2015 1454   K 3.9 01/14/2023 0819   K 4.3 11/30/2015 1454   CL 109 01/14/2023 0819   CO2 22 01/14/2023 0819   CO2 21 (L) 11/30/2015 1454   GLUCOSE 107 (H) 01/14/2023 0819   GLUCOSE 130 11/30/2015 1454   BUN 22 01/14/2023 0819   BUN 20 11/28/2019 0937   BUN 23.9 11/30/2015 1454   CREATININE 1.54 (H) 01/14/2023 0819   CREATININE 1.62 (H) 02/19/2022 1026   CREATININE 1.4 (H) 11/30/2015 1454   CALCIUM 9.0 01/14/2023 0819   CALCIUM 9.3 11/30/2015 1454   PROT 6.7 12/24/2022 0812   PROT 6.9 11/28/2019 0937   PROT 7.4 11/30/2015 1454   ALBUMIN 4.0 12/24/2022 0812   ALBUMIN 4.3 11/28/2019 0937   ALBUMIN 3.8 11/30/2015 1454   AST 10 12/24/2022 0812   AST 16 11/30/2015 1454   ALT 11 12/24/2022 0812   ALT 18 11/30/2015 1454   ALKPHOS 63 12/24/2022 0812   ALKPHOS 88 11/30/2015 1454   BILITOT 0.4 12/24/2022 0812   BILITOT <0.2 11/28/2019 0937   BILITOT <0.30 11/30/2015 1454   GFRNONAA 38 (L) 11/28/2021 1135   GFRNONAA 58 (L) 01/10/2016 0839   GFRAA 57 (L) 11/28/2019 0937   GFRAA 67 01/10/2016 0839   Lab Results  Component Value Date   CHOL 110 07/29/2022   HDL 40.10 07/29/2022   LDLCALC 54 07/29/2022   LDLDIRECT 85.0 05/18/2014   TRIG 79.0 07/29/2022   CHOLHDL 3 07/29/2022   Lab Results  Component Value Date   HGBA1C 7.0 (H) 01/14/2023   No results found for:  "VITAMINB12" Lab Results  Component Value Date   TSH 4.34 07/29/2022   Margie Ege, AGNP-C, DNP 04/01/2023, 12:51 PM [facility] [address] [place], [place] [address] [phone]

## 2023-04-01 ENCOUNTER — Telehealth: Payer: Self-pay

## 2023-04-01 ENCOUNTER — Ambulatory Visit: Payer: Medicare Other | Admitting: Neurology

## 2023-04-01 ENCOUNTER — Encounter: Payer: Self-pay | Admitting: Neurology

## 2023-04-01 VITALS — BP 140/88 | Ht 68.0 in | Wt 225.0 lb

## 2023-04-01 DIAGNOSIS — G4731 Primary central sleep apnea: Secondary | ICD-10-CM | POA: Insufficient documentation

## 2023-04-01 DIAGNOSIS — G4733 Obstructive sleep apnea (adult) (pediatric): Secondary | ICD-10-CM | POA: Diagnosis not present

## 2023-04-01 NOTE — Telephone Encounter (Signed)
-----   Message from Glean Salvo sent at 04/01/2023  1:52 PM EDT ----- Order to DME to reduce pressure and EPR

## 2023-04-01 NOTE — Telephone Encounter (Signed)
Cpap order sent thru [facility] to [facility]

## 2023-04-01 NOTE — Patient Instructions (Signed)
I will order in lab titration, you may need a BIPAP machine. I will reduce your CPAP pressure range currently down from 5-12 to 5-11 cm. We will contact you to schedule. Continue using CPAP for now

## 2023-04-03 ENCOUNTER — Ambulatory Visit: Payer: Medicare Other | Attending: Cardiology | Admitting: Cardiology

## 2023-04-03 ENCOUNTER — Encounter: Payer: Self-pay | Admitting: Cardiology

## 2023-04-03 VITALS — BP 128/60 | HR 66 | Ht 68.0 in | Wt 225.0 lb

## 2023-04-03 DIAGNOSIS — E1169 Type 2 diabetes mellitus with other specified complication: Secondary | ICD-10-CM | POA: Diagnosis not present

## 2023-04-03 DIAGNOSIS — M25562 Pain in left knee: Secondary | ICD-10-CM | POA: Diagnosis not present

## 2023-04-03 DIAGNOSIS — I10 Essential (primary) hypertension: Secondary | ICD-10-CM | POA: Diagnosis not present

## 2023-04-03 DIAGNOSIS — N1831 Chronic kidney disease, stage 3a: Secondary | ICD-10-CM

## 2023-04-03 DIAGNOSIS — G8929 Other chronic pain: Secondary | ICD-10-CM | POA: Diagnosis not present

## 2023-04-03 DIAGNOSIS — E785 Hyperlipidemia, unspecified: Secondary | ICD-10-CM | POA: Diagnosis not present

## 2023-04-03 DIAGNOSIS — G4733 Obstructive sleep apnea (adult) (pediatric): Secondary | ICD-10-CM

## 2023-04-03 DIAGNOSIS — I2510 Atherosclerotic heart disease of native coronary artery without angina pectoris: Secondary | ICD-10-CM

## 2023-04-03 NOTE — Progress Notes (Signed)
Cardiology Office Note:  .   Date:  04/03/2023  ID:  Carl Gutierrez, DOB January 07, 1946, MRN [id number] PCP: Shade Flood, MD  [facility] Providers Cardiologist:  Bryan Lemma, MD     Chief Complaint  Patient presents with   Follow-up    8 months.  Scheduled to come with his wife in order to consolidate visits.   Coronary Artery Disease    No angina.  Activity limited by knee pain    Patient Profile: .     Carl Gutierrez is a 77 y.o. male with a PMH notable for CAD-PCI with CRFs of HTN, HLD, ~OSA/central Sleep Apnea who presents here for 8 month f/u at the request of Shade Flood, MD.  Pertinent Noncardiac Conditions: Left-sided RCCA-status post cryoablation CKD 3 Panhypopituitarism Hypothyroidism    I last saw Carl Gutierrez in February 2023 as a hospital follow-up-he was seen in the ER for chest pain in January 2023.  Was evaluated with a Myoview stress test (see below).  During that visit he still has some left shoulder and left upper chest discomfort probably musculoskeletal in nature. He was last seen by Edd Fabian, NP on August 07, 2022 for routine follow-up.  Was feeling well.  Finally getting some relief of his shoulder pain with anti-inflammatories.  BPs were stable.  Had occasional dizziness with standing.  Knee pain was limiting activity.  Negative cardiac symptoms.  No changes  Subjective . Discussed the use of AI scribe software for clinical note transcription with the patient, who gave verbal consent to proceed.  History of Present Illness   The patient, with a history of CAD, DM-2, HTN & HLD along with Hypothyroid / Panhypopituitarism presents for ~ delayed 6 month f/u with no active CV complaints.  Mostly limited by Knee pain - but denies any angina or dyspnea at rests or with exertion.  No CHF symptoms of PND/ orthopnea or edema.   He denies any significant rapid or irregular heartbeats -- nothing sustained, just a few skipped beats "every now &  then".   He tries to stay active & eat well.    The patient's heart disease is reportedly well-managed, with no active symptoms such as chest pain, shortness of breath, or swelling. The patient's diabetes is controlled with a combination of Farxiga and Metformin, along with twice-daily insulin injections. The patient's thyroid disorder is managed by an endocrinologist, following the retirement of his previous provider.  The patient's medications include Crestor for cholesterol management, Toprol for heart disease, and Amlodipine for blood pressure control. The patient's cholesterol levels were reportedly within the target range at his last check in February. The patient's blood pressure was also reported to be well-controlled at his last reading.  Notable non-CV complaint involves persistent left knee pain following a previous arthroscopic surgery. The pain is described as constant, with no mention of associated swelling or stiffness. The patient reports a previous attempt at pain management involving an injection of a gel-like substance, humorously referred to as "chicken soup," into the knee joint, which was unsuccessful in providing relief.  The pain appears to be the ongoing pain in his left knee, which is impacting his mobility and quality of life.     Cardiovascular ROS: no chest pain or dyspnea on exertion denies chest pain, shortness of breath, lower extremity edema, fatigue, palpitations, melena, hematuria, hemoptysis, diaphoresis, weakness, presyncope, syncope, orthopnea, and PND.  No claudication.  TIA/CVA/amaurosis fugax.  No melena, hematochezia, hematuria, or epistaxis  ROS:  Review of Systems - Negative except L Knee pain - not helped with recent injection    Objective   Studies Reviewed: Marland Kitchen       Myoview 07/03/21: normal EF 55-60%; No ischemia/ Infarct; Diaphragmatic attenuation  Lab Results  Component Value Date   NA 140 01/14/2023   K 3.9 01/14/2023   CREATININE 1.54 (H)  01/14/2023   EGFR 46.0 02/20/2023   GLUCOSE 107 (H) 01/14/2023   Lab Results  Component Value Date   CHOL 110 07/29/2022   HDL 40.10 07/29/2022   LDLCALC 54 07/29/2022   LDLDIRECT 85.0 05/18/2014   TRIG 79.0 07/29/2022   CHOLHDL 3 07/29/2022   Lab Results  Component Value Date   HGBA1C 7.0 (H) 01/14/2023    Risk Assessment/Calculations:             Physical Exam:   VS:  BP 128/60 (BP Location: Left Arm, Patient Position: Sitting, Cuff Size: Normal)   Pulse 66   Ht 5\' 8"  (1.727 m)   Wt 225 lb (102.1 kg)   SpO2 97%   BMI 34.21 kg/m    Wt Readings from Last 3 Encounters:  04/03/23 225 lb (102.1 kg)  04/01/23 225 lb (102.1 kg)  01/21/23 224 lb (101.6 kg)    GEN: Healthy appearing, well nourished, well well-groomed; in no acute distress;  NECK: No JVD; No carotid bruits CARDIAC: Normal S1, S2; RRR, no murmurs, rubs, gallops RESPIRATORY:  Clear to auscultation without rales, wheezing or rhonchi ; nonlabored, good air movement. ABDOMEN: Soft, non-tender, non-distended EXTREMITIES:  No edema; No deformity; walks with a significant antalgic gait favoring his left knee.     ASSESSMENT AND PLAN: .    Problem List Items Addressed This Visit       Cardiology Problems   Coronary artery disease involving native heart without angina pectoris - Primary (Chronic)    Distant history of PCI dating back to 2004 in 2008. Myoview in January 2023 was nonischemic.  Just diaphragmatic attenuation. No current symptoms of anginal chest pain, shortness of breath, or heart palpitations; no CHF symptoms. Activity limited by knee pain but tries to do some type of exercise.  We talked about maybe doing some type of pedaling exercises to help his knee rehab.  Plan: -Continue current medications. Aspirin 81 mg daily => okay to hold for procedures or surgeries 5 to 7 days preop. Toprol 25 mg daily, and Amlodipine 10 mg daily with good lipid and BP control. Rosuvastatin 40 mg  daily Empagliflozin-metformin       Essential hypertension (Chronic)    Well-controlled BP on amlodipine 10 mg daily and Toprol 25 mg daily. If BP were to increase, would consider ACE inhibitor or ARB in light of diabetes.      Hyperlipidemia associated with type 2 diabetes mellitus (HCC) (Chronic)    Lipids were last checked in February, outstanding with an LDL of 54.  At goal we will of target LDL<55. A1c checked in July was 7.0-Target is less than 6.  Was started on Xigduo (Dapagliflozin/Metformin,10-998 mg daily) along with 75/25 insulin  Plan:  Continue rosuvastatin 40 mg daily.   Defer diabetes management to endocrinologist.  He is now going to be followed by Dr. Iraq Thapa        Other   Left knee pain   OSA (obstructive sleep apnea) (Chronic)    Currently being evaluated by neurology.  May be some component of central sleep apnea as well as obstructive.  Stage 3a chronic kidney disease (HCC) (Chronic)    Creatinine seems stable.  Now being followed by [facility]       Thyroid Management Under the care of Dr. Erroll Luna. No specific issues discussed. -Continue current management with Dr. Erroll Luna.      Follow-up: Annual follow-up visit to be scheduled for October 2025. Return in about 1 year (around 04/02/2024) for 1 Yr Follow-up.  Total time spent: 26 min spent with patient + 18 min spent charting = 44 min     Signed, Marykay Lex, MD, MS Bryan Lemma, M.D., M.S. Interventional Cardiologist  [facility]  Pager # [phone] Phone # [phone] [address] [place], [place] [address]

## 2023-04-03 NOTE — Assessment & Plan Note (Addendum)
Lipids were last checked in February, outstanding with an LDL of 54.  At goal we will of target LDL<55. A1c checked in July was 7.0-Target is less than 6.  Was started on Xigduo (Dapagliflozin/Metformin,10-998 mg daily) along with 75/25 insulin  Plan:  Continue rosuvastatin 40 mg daily.   Defer diabetes management to endocrinologist.  He is now going to be followed by Dr. Iraq Thapa

## 2023-04-03 NOTE — Assessment & Plan Note (Signed)
Well-controlled BP on amlodipine 10 mg daily and Toprol 25 mg daily. If BP were to increase, would consider ACE inhibitor or ARB in light of diabetes.

## 2023-04-03 NOTE — Assessment & Plan Note (Signed)
Currently being evaluated by neurology.  May be some component of central sleep apnea as well as obstructive.

## 2023-04-03 NOTE — Patient Instructions (Signed)
Medication Instructions:  No changes    *If you need a refill on your cardiac medications before your next appointment, please call your pharmacy*   Lab Work: None    If you have labs (blood work) drawn today and your tests are completely normal, you will receive your results only by: MyChart Message (if you have MyChart) OR A paper copy in the mail If you have any lab test that is abnormal or we need to change your treatment, we will call you to review the results.   Testing/Procedures:  None   Follow-Up: At [facility], you and your health needs are our priority.  As part of our continuing mission to provide you with exceptional heart care, we have created designated Provider Care Teams.  These Care Teams include your primary Cardiologist (physician) and Advanced Practice Providers (APPs -  Physician Assistants and Nurse Practitioners) who all work together to provide you with the care you need, when you need it.  We recommend signing up for the patient portal called "MyChart".  Sign up information is provided on this After Visit Summary.  MyChart is used to connect with patients for Virtual Visits (Telemedicine).  Patients are able to view lab/test results, encounter notes, upcoming appointments, etc.  Non-urgent messages can be sent to your provider as well.   To learn more about what you can do with MyChart, go to [email or web address].    Your next appointment:   1 year(s)  The format for your next appointment:   In Person  Provider:   Bryan Lemma, MD

## 2023-04-03 NOTE — Assessment & Plan Note (Addendum)
Distant history of PCI dating back to 2004 in 2008. Myoview in January 2023 was nonischemic.  Just diaphragmatic attenuation. No current symptoms of anginal chest pain, shortness of breath, or heart palpitations; no CHF symptoms. Activity limited by knee pain but tries to do some type of exercise.  We talked about maybe doing some type of pedaling exercises to help his knee rehab.  Plan: -Continue current medications. Aspirin 81 mg daily => okay to hold for procedures or surgeries 5 to 7 days preop. Toprol 25 mg daily, and Amlodipine 10 mg daily with good lipid and BP control. Rosuvastatin 40 mg daily Empagliflozin-metformin

## 2023-04-03 NOTE — Assessment & Plan Note (Signed)
Creatinine seems stable.  Now being followed by [facility]

## 2023-04-11 ENCOUNTER — Other Ambulatory Visit: Payer: Self-pay | Admitting: Family Medicine

## 2023-04-11 DIAGNOSIS — E785 Hyperlipidemia, unspecified: Secondary | ICD-10-CM

## 2023-04-11 DIAGNOSIS — I10 Essential (primary) hypertension: Secondary | ICD-10-CM

## 2023-04-15 ENCOUNTER — Encounter: Payer: Self-pay | Admitting: Family Medicine

## 2023-04-15 ENCOUNTER — Ambulatory Visit: Payer: Medicare Other | Admitting: Family Medicine

## 2023-04-15 VITALS — BP 130/68 | HR 73 | Temp 97.9°F | Ht 68.0 in | Wt 225.4 lb

## 2023-04-15 DIAGNOSIS — Z23 Encounter for immunization: Secondary | ICD-10-CM | POA: Insufficient documentation

## 2023-04-15 DIAGNOSIS — I10 Essential (primary) hypertension: Secondary | ICD-10-CM | POA: Diagnosis not present

## 2023-04-15 DIAGNOSIS — H259 Unspecified age-related cataract: Secondary | ICD-10-CM | POA: Insufficient documentation

## 2023-04-15 DIAGNOSIS — E785 Hyperlipidemia, unspecified: Secondary | ICD-10-CM

## 2023-04-15 DIAGNOSIS — G4733 Obstructive sleep apnea (adult) (pediatric): Secondary | ICD-10-CM | POA: Diagnosis not present

## 2023-04-15 DIAGNOSIS — M25569 Pain in unspecified knee: Secondary | ICD-10-CM | POA: Insufficient documentation

## 2023-04-15 DIAGNOSIS — N189 Chronic kidney disease, unspecified: Secondary | ICD-10-CM

## 2023-04-15 DIAGNOSIS — R918 Other nonspecific abnormal finding of lung field: Secondary | ICD-10-CM | POA: Diagnosis not present

## 2023-04-15 DIAGNOSIS — H40013 Open angle with borderline findings, low risk, bilateral: Secondary | ICD-10-CM | POA: Insufficient documentation

## 2023-04-15 DIAGNOSIS — H9190 Unspecified hearing loss, unspecified ear: Secondary | ICD-10-CM | POA: Insufficient documentation

## 2023-04-15 DIAGNOSIS — Z0289 Encounter for other administrative examinations: Secondary | ICD-10-CM | POA: Insufficient documentation

## 2023-04-15 DIAGNOSIS — E230 Hypopituitarism: Secondary | ICD-10-CM | POA: Insufficient documentation

## 2023-04-15 DIAGNOSIS — H2513 Age-related nuclear cataract, bilateral: Secondary | ICD-10-CM | POA: Insufficient documentation

## 2023-04-15 DIAGNOSIS — I2510 Atherosclerotic heart disease of native coronary artery without angina pectoris: Secondary | ICD-10-CM | POA: Insufficient documentation

## 2023-04-15 LAB — LIPID PANEL
Cholesterol: 93 mg/dL (ref 0–200)
HDL: 32.4 mg/dL — ABNORMAL LOW (ref >39.00)
LDL Cholesterol: 43 mg/dL (ref 0–99)
NonHDL: 60.43
Total CHOL/HDL Ratio: 3
Triglycerides: 88 mg/dL (ref 0.0–149.0)
VLDL: 17.6 mg/dL (ref 0.0–40.0)

## 2023-04-15 LAB — COMPREHENSIVE METABOLIC PANEL
ALT: 11 U/L (ref 0–53)
AST: 12 U/L (ref 0–37)
Albumin: 4.1 g/dL (ref 3.5–5.2)
Alkaline Phosphatase: 72 U/L (ref 39–117)
BUN: 21 mg/dL (ref 6–23)
CO2: 24 meq/L (ref 19–32)
Calcium: 9.3 mg/dL (ref 8.4–10.5)
Chloride: 109 meq/L (ref 96–112)
Creatinine, Ser: 1.52 mg/dL — ABNORMAL HIGH (ref 0.40–1.50)
GFR: 43.35 mL/min — ABNORMAL LOW (ref >60.00)
Glucose, Bld: 106 mg/dL — ABNORMAL HIGH (ref 70–99)
Potassium: 3.8 meq/L (ref 3.5–5.1)
Sodium: 141 meq/L (ref 135–145)
Total Bilirubin: 0.3 mg/dL (ref 0.2–1.2)
Total Protein: 7.3 g/dL (ref 6.0–8.3)

## 2023-04-15 NOTE — Progress Notes (Signed)
Subjective:  Patient ID: Carl Gutierrez, male    DOB: 1946-02-01  Age: 77 y.o. MRN: [id number]  CC:  Chief Complaint  Patient presents with   Medical Management of Chronic Issues    HPI [facility] presents for follow up. No acute concerns or health changes.   Followed by endocrinology  -Dr. Erroll Luna (prior Dr. Lucianne Muss), for type 2 diabetes, panhypopituitarism, with stage IIIa CKD.  He is treated with levothyroxine, Cortef for his hypopituitarism, Xigduo and Humalog for diabetes.  He is on statin.  Elevated microalbumin ratio previously -7.2 on 09/11/2022.  Declines foot exam- had with in home visit.  Had flu and COVID vaccines at [facility],  Ophthalmology exam through the [facility] in [place].  Denies cough, dyspnea.   Cardiac Followed with Dr. Herbie Baltimore, history of CAD.  Stress test in January 2023 without ischemia (diaphragmatic attenuation) and an EF 55 to 60% at time.  On amlodipine, aspirin, metoprolol and rosuvastatin with as needed nitroglycerin.  History of OSA, treated with CPAP, recent visit with sleep specialist on October 16, plan for in lab titration, possible BiPAP.  Crestor 40 mg daily without new myalgias or side effects. Most recent creatinine in July had improved from his March reading of 1.8. Cardiology visit October 18.  Well-controlled blood pressure on amlodipine 10 mg and Toprol 25 mg daily.  Option of addition of ACE inhibitor or ARB given his history of diabetes. Home readings -130/60-70 BP Readings from Last 3 Encounters:  04/15/23 130/68  04/03/23 128/60  04/01/23 (!) 140/88   Lab Results  Component Value Date   CREATININE 1.54 (H) 01/14/2023  GFR 42.75 on 7/31.    Followed by urology, Dr. Liliane Shi with prior left renal mass status post cryoablation with biopsy in 2023, Dr. Fredia Sorrow.  Repeat imaging September 23rd: IMPRESSION: 1. Significantly diminished size of cryoablation site of the posterior inferior pole of the left kidney, with associated  fat necrosis. Minimal volume of loculated fluid remains with no associated internal contrast enhancement. 2. No evidence of recurrent or metastatic disease in the abdomen.   Aortic Atherosclerosis (ICD10-I70.0).    History Patient Active Problem List   Diagnosis Date Noted   Age-related cataract 04/15/2023   Age-related nuclear cataract, bilateral 04/15/2023   Hearing loss 04/15/2023   Open angle with borderline findings, low risk, bilateral 04/15/2023   Pain in joint, lower leg 04/15/2023   Coronary artery disease 04/15/2023   Encounter for other administrative examinations 04/15/2023   Encounter for immunization 04/15/2023   Hypopituitarism (HCC) 04/15/2023   Left knee pain 04/03/2023   OSA (obstructive sleep apnea) 04/01/2023   Central sleep apnea 04/01/2023   Left renal mass 12/04/2021   Chest pain with low risk for cardiac etiology 09/08/2021   Gout of big toe 10/31/2020   Stage 3a chronic kidney disease (HCC) 01/04/2020   Anemia in chronic kidney disease 11/30/2015   Recurrent deep vein thrombosis (DVT) (HCC) 10/01/2015   Hematuria, unspecified 10/01/2015   Elevated serum creatinine 10/01/2015   BPH (benign prostatic hyperplasia) 08/13/2015   Benign localized hyperplasia of prostate with urinary obstruction 07/23/2015   Preop cardiovascular exam 07/17/2015   Encounter for CDL (commercial driving license) exam 40/98/1191   Obesity (BMI 30-39.9) 06/26/2013   Essential hypertension 06/26/2013   Panhypopituitarism (HCC) 11/17/2011     11/17/2011   Diabetes mellitus (HCC) 11/17/2011   Hyperlipidemia associated with type 2 diabetes mellitus (HCC) 11/17/2011   Coronary artery disease involving native heart without angina pectoris 07/19/2002  Past Medical History:  Diagnosis Date   CAD S/P percutaneous coronary angioplasty February 2004; 2008   PCI-dLAD: 2.25 mm x 16 mm Express BMS; Ramus- 2.75 mm x 18 mm Cypher DES (2.8 mm); follow-up 11/'08: Patent LAD/ramus stents.  90% small OM. EF 50-60%.; Myoview 1/'12: Exercise 9 min, 10 METS with no ischemia/infarction.    Clotting disorder (HCC)    Diabetes mellitus    Low-dose metformin   DJD (degenerative joint disease), lumbosacral     status post L4-L5 surgery   Essential hypertension    History of DVT of lower extremity     postoperative   Hyperlipidemia with target LDL less than 70    Hypothyroidism    Synthroid   Panhypopituitarism (HCC)    Status post pituitary adenoma removal in 2012   Recurrent deep vein thrombosis (DVT) (HCC) 10/01/2015   Past Surgical History:  Procedure Laterality Date   ARTHROSCOPY KNEE W/ DRILLING Left    BRAIN SURGERY  06/16/2010   Removal of pituitary adenoma   CARDIAC CATHETERIZATION  04/17/2007   Significant LAD tapering but no significant disease the distal stent. Patent Ramus stent. 90% lesion in small OM 2; small nondominant RCA. Medical therapy. EF 50-60%.   CORONARY ANGIOPLASTY WITH STENT PLACEMENT  07/17/2002   PCI-dLAD: 2.25 mm x 16 mm BMS;;PCI- RI: 2.75 mm 18 mm Cypher DES   IR RADIOLOGIST EVAL & MGMT  11/04/2021   IR RADIOLOGIST EVAL & MGMT  12/31/2021   IR RADIOLOGIST EVAL & MGMT  02/27/2022   IR RADIOLOGIST EVAL & MGMT  03/09/2023   NM MYOVIEW LTD  06/16/2010   a) Exercise ~9 min - 10 METS; no ischemia or infarction; b) 06/2021: LOW RISK.  Normal LV size and function.  EF 55 to 60%.  No RWMA.  No ischemia or infarct.  Fixed inferior basal apical defect with normal motion suggestive of artifact.  (Diaphragmatic attenuation)   PROSTATECTOMY N/A 08/13/2015   Procedure: TRANSVESICLE OPEN PROSTATECTOMY** ;  Surgeon: Jethro Bolus, MD;  Location: [facility];  Service: Urology;  Laterality: N/A;   RADIOLOGY WITH ANESTHESIA N/A 12/04/2021   Procedure: LEFT RENAL CRYO ABLATION;  Surgeon: Irish Lack, MD;  Location: [facility];  Service: Radiology;  Laterality: N/A;   SPINE SURGERY     L4-L5   Allergies  Allergen Reactions   Hydrochlorothiazide     Leg and side pain  with this   Other     Blood pressure med name unknown.   Prior to Admission medications   Medication Sig Start Date End Date Taking? Authorizing Provider  Alcohol Swabs (PHARMACIST CHOICE ALCOHOL) PADS SMARTSIG:1 Each Topical 4 Times Daily 07/17/21  Yes [provider]  amLODipine (NORVASC) 10 MG tablet Take 1 tablet (10 mg total) by mouth daily. 10/13/22  Yes Shade Flood, MD  aspirin 81 MG tablet Take 81 mg by mouth daily. Reported on 09/05/2015   Yes [provider]  B-D UF III MINI PEN NEEDLES 31G X 5 MM MISC USE FOR INSULIN PEN TWICE A DAY 04/29/21  Yes Reather Littler, MD  glucose blood (ONETOUCH VERIO) test strip USE AS INSTRUCTED TO CHECK BLOOD SUGAR 2X DAILY. 10/05/22  Yes Reather Littler, MD  hydrocortisone (CORTEF) 20 MG tablet Take 30 mg by mouth daily. Take 1 tablet in the morning and half tablet at 5 PM 02/20/22  Yes [provider]  Insulin Lispro Prot & Lispro (HUMALOG 75/25 MIX) (75-25) 100 UNIT/ML Kwikpen INJECT 20 UNITS UNDER THE SKIN BEFORE  BREAKFAST AND 6 UNITS BEFORE SUPPER. (REPLACES 70/30) 06/23/22  Yes Reather Littler, MD  levothyroxine (SYNTHROID) 88 MCG tablet TAKE 1 TABLET BY MOUTH EVERY DAY 02/20/23  Yes Shade Flood, MD  metoprolol succinate (TOPROL-XL) 25 MG 24 hr tablet TAKE 1 TABLET (25 MG TOTAL) BY MOUTH DAILY. 04/13/23  Yes Shade Flood, MD  rosuvastatin (CRESTOR) 40 MG tablet TAKE 1 TABLET BY MOUTH EVERY DAY 04/13/23  Yes Shade Flood, MD  XIGDUO XR 10-998 MG TB24 TAKE 1 TABLET BY MOUTH EVERY DAY 12/29/22  Yes Reather Littler, MD  nitroGLYCERIN (NITROSTAT) 0.4 MG SL tablet Place 1 tablet (0.4 mg total) under the tongue every 5 (five) minutes as needed for chest pain. 07/01/21 08/07/22  [place], Peter M, MD   Social History   Socioeconomic History   Marital status: Married    Spouse name: Not on file   Number of children: Not on file   Years of education: Not on file   Highest education level: Not on file  Occupational History   Not on  file  Tobacco Use   Smoking status: Former    Current packs/day: 0.00    Types: Cigarettes    Quit date: 06/24/1993    Years since quitting: 29.8   Smokeless tobacco: Never  Vaping Use   Vaping status: Never Used  Substance and Sexual Activity   Alcohol use: No   Drug use: No   Sexual activity: Not Currently    Comment: Local truck driver, 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 Exercises only occasionally. Not as much as he desires 2. Works as a Arts administrator.   He does not smoke cigarettes -- quit in 1995. Does not drink alcohol.   Social Determinants of Health   Financial Resource Strain: Low Risk  (10/02/2022)   Overall Financial Resource Strain (CARDIA)    Difficulty of Paying Living Expenses: Not very hard  Food Insecurity: No Food Insecurity (10/02/2022)   Hunger Vital Sign    Worried About Running Out of Food in the Last Year: Never true    Ran Out of Food in the Last Year: Never true  Transportation Needs: No Transportation Needs (10/02/2022)   PRAPARE - Administrator, Civil Service (Medical): No    Lack of Transportation (Non-Medical): No  Physical Activity: Inactive (10/02/2022)   Exercise Vital Sign    Days of Exercise per Week: 0 days    Minutes of Exercise per Session: 0 min  Stress: No Stress Concern Present (10/02/2022)   [employer] of Occupational Health - Occupational Stress Questionnaire    Feeling of Stress : Not at all  Social Connections: Moderately Integrated (10/02/2022)   Social Connection and Isolation Panel [NHANES]    Frequency of Communication with Friends and Family: Three times a week    Frequency of Social Gatherings with Friends and Family: Twice a week    Attends Religious Services: More than 4 times per year    Active Member of [employer] or Organizations: No    Attends Banker Meetings: Never     Marital Status: Married  Catering manager Violence: Not At Risk (10/02/2022)   Humiliation, Afraid, Rape, and Kick questionnaire    Fear of Current or Ex-Partner: No    Emotionally Abused: No    Physically Abused: No  Sexually Abused: No    Review of Systems   Objective:   Vitals:   04/15/23 0814  BP: 130/68  Pulse: 73  Temp: 97.9 F (36.6 C)  TempSrc: Temporal  SpO2: 95%  Weight: 225 lb 6.4 oz (102.2 kg)  Height: 5\' 8"  (1.727 m)     Physical Exam Vitals reviewed.  Constitutional:      Appearance: He is well-developed.  HENT:     Head: Normocephalic and atraumatic.  Neck:     Vascular: No carotid bruit or JVD.  Cardiovascular:     Rate and Rhythm: Normal rate and regular rhythm.     Heart sounds: Normal heart sounds. No murmur heard. Pulmonary:     Effort: Respiratory distress present.     Breath sounds: Rales (coarse breath sounds left lower lobe.) present.  Musculoskeletal:     Right lower leg: No edema.     Left lower leg: No edema.  Skin:    General: Skin is warm and dry.  Neurological:     Mental Status: He is alert and oriented to person, place, and time.  Psychiatric:        Mood and Affect: Mood normal.        Assessment & Plan:  Carl Gutierrez is a 77 y.o. male . Essential hypertension Chronic kidney disease, unspecified CKD stage  -Overall stable on current regimen, check labs for history of CKD, adjust meds accordingly.  Hyperlipidemia, unspecified hyperlipidemia type - Plan: Comprehensive metabolic panel, Lipid panel  -Tolerating Crestor, continue same, check labs and adjust plan accordingly.  OSA (obstructive sleep apnea)  -Follow-up with sleep specialist for titration and possible BiPAP.  Lung field abnormal finding on examination - Plan: DG Chest 2 View Few coarse breath sounds noted as above, asymptomatic, denies recent cough, fever, dyspnea.  Check imaging then decide on further evaluation depending on that study with RTC  precautions if symptomatic.  No orders of the defined types were placed in this encounter.  Patient Instructions  No medication changes at this time.  Keep an eye on your blood pressures at home and if those start to run higher I would consider a medication like lisinopril or losartan that can help protect kidneys and treat blood pressure.  Let me know. Keep follow-up with specialist as planned. I do hear some possible congestion or abnormal sounds in your left lower lung.  Please have x-ray performed at [facility] location below.  If any concerns on that imaging I will let you know.  If any new cough, shortness of breath or new symptoms let me know.  Take care!  [facility] or xray: Walk in 8:30-4:30 during weekdays, no appointment needed [address]  [place], [place] [address]     Signed,   Meredith Staggers, MD [facility], [facility] 04/15/23 8:52 AM

## 2023-04-15 NOTE — Patient Instructions (Addendum)
No medication changes at this time.  Keep an eye on your blood pressures at home and if those start to run higher I would consider a medication like lisinopril or losartan that can help protect kidneys and treat blood pressure.  Let me know. Keep follow-up with specialist as planned. I do hear some possible congestion or abnormal sounds in your left lower lung.  Please have x-ray performed at [facility] location below.  If any concerns on that imaging I will let you know.  If any new cough, shortness of breath or new symptoms let me know.  Take care!  [facility] or xray: Walk in 8:30-4:30 during weekdays, no appointment needed [address]  [place], [place] [address]

## 2023-04-16 ENCOUNTER — Ambulatory Visit (INDEPENDENT_AMBULATORY_CARE_PROVIDER_SITE_OTHER)
Admission: RE | Admit: 2023-04-16 | Discharge: 2023-04-16 | Disposition: A | Discharge status: Home / Self Care | Payer: Medicare Other | Source: Ambulatory Visit | Attending: Family Medicine | Admitting: Family Medicine

## 2023-04-16 DIAGNOSIS — I771 Stricture of artery: Secondary | ICD-10-CM | POA: Diagnosis not present

## 2023-04-16 DIAGNOSIS — R918 Other nonspecific abnormal finding of lung field: Secondary | ICD-10-CM

## 2023-04-16 DIAGNOSIS — R069 Unspecified abnormalities of breathing: Secondary | ICD-10-CM | POA: Diagnosis not present

## 2023-04-19 ENCOUNTER — Other Ambulatory Visit: Payer: Self-pay | Admitting: Family Medicine

## 2023-04-19 DIAGNOSIS — R918 Other nonspecific abnormal finding of lung field: Secondary | ICD-10-CM

## 2023-04-19 NOTE — Progress Notes (Signed)
See chest x-ray report.  CT chest ordered.

## 2023-04-20 ENCOUNTER — Telehealth: Payer: Self-pay | Admitting: Family Medicine

## 2023-04-20 NOTE — Telephone Encounter (Signed)
I have LM providing the appt information for the CT scan. He needs to arrive at 12:20 PM for a 12:40 PM appt. If he needs to cancel or R/s he needs to call [phone] ( a 24-hr notice to avoid a $75 NO show fee)

## 2023-04-22 ENCOUNTER — Telehealth: Payer: Self-pay | Admitting: Neurology

## 2023-04-22 NOTE — Telephone Encounter (Signed)
CPAP- [employer] medicare no auth req   Patient is scheduled at [facility] for 04/26/23 at 8 pm.   I spoke with the patient and informed him to eat his evening meal before arriving and bring any medication he takes. He is aware to arrive at 8 pm.

## 2023-04-22 NOTE — Telephone Encounter (Signed)
I pulled a download for CPAP a few weeks after office visit to check on changes, planned to reduce pressure from 5-12 down to 5-11, changes were not made, still at 5-12 EPR 3.  Current AHI 21.9 (central 15.0, obstructive 1.4, unknown 2.9).  Looks like he is scheduled for in lab titration 04/26/23

## 2023-04-23 ENCOUNTER — Telehealth: Payer: Self-pay

## 2023-04-23 NOTE — Telephone Encounter (Signed)
Pt would like a call back

## 2023-04-23 NOTE — Telephone Encounter (Signed)
-----   Message from Shade Flood sent at 04/23/2023 11:13 AM EST ----- Please call patient.  Kidney function test was stable from 3 months ago.  Cholesterol levels were overall stable.   His CT is scheduled for tomorrow, hopefully I will see those results this weekend but will let him know as soon as I receive them.  Please let me know if he has any questions.

## 2023-04-23 NOTE — Telephone Encounter (Signed)
Pt has been notified. Pt has no concerns

## 2023-04-24 ENCOUNTER — Ambulatory Visit
Admission: RE | Admit: 2023-04-24 | Discharge: 2023-04-24 | Disposition: A | Discharge status: Home / Self Care | Payer: Medicare Other | Source: Ambulatory Visit | Attending: Family Medicine | Admitting: Family Medicine

## 2023-04-24 DIAGNOSIS — R918 Other nonspecific abnormal finding of lung field: Secondary | ICD-10-CM | POA: Diagnosis not present

## 2023-04-24 DIAGNOSIS — I700 Atherosclerosis of aorta: Secondary | ICD-10-CM | POA: Diagnosis not present

## 2023-04-24 DIAGNOSIS — J432 Centrilobular emphysema: Secondary | ICD-10-CM | POA: Diagnosis not present

## 2023-04-24 DIAGNOSIS — J479 Bronchiectasis, uncomplicated: Secondary | ICD-10-CM | POA: Diagnosis not present

## 2023-04-24 MED ORDER — IOPAMIDOL (ISOVUE-370) INJECTION 76%
500.0000 mL | Freq: Once | INTRAVENOUS | Status: AC | PRN
  Administered 2023-04-24: 70 mL via INTRAVENOUS

## 2023-04-26 ENCOUNTER — Ambulatory Visit (INDEPENDENT_AMBULATORY_CARE_PROVIDER_SITE_OTHER): Payer: Medicare Other | Admitting: Neurology

## 2023-04-26 DIAGNOSIS — G4733 Obstructive sleep apnea (adult) (pediatric): Secondary | ICD-10-CM

## 2023-04-26 DIAGNOSIS — G4720 Circadian rhythm sleep disorder, unspecified type: Secondary | ICD-10-CM

## 2023-04-26 DIAGNOSIS — G4731 Primary central sleep apnea: Secondary | ICD-10-CM

## 2023-04-27 NOTE — Procedures (Signed)
Physician Interpretation:     [facility] PAP TITRATION INTERPRETATION REPORT   STUDY DATE: 04/26/2023      PATIENT NAME:  Carl Gutierrez         DATE OF BIRTH:  September 01, 1945  PATIENT ID:  [id number]    TYPE OF STUDY:  CPAP  READING PHYSICIAN: Huston Foley, MD, PhD SCORING TECHNICIAN: Margaretann Loveless, RPSGT   Referred by: Shade Flood, MD  ? History and Indication for Testing: 77 year old male with an underlying complex medical history of hyperlipidemia, hypertension, type 2 diabetes, panhypopituitarism, gout, chronic kidney disease, anemia, coronary artery disease with status post angioplasty, and obesity, who?presents for a full night titration study. He has been on autoPAP of 5-12 cm with a moderately elevated residual AHI and primarily central events. His baseline sleep study from 12/23/22 showed severe obstructive sleep apnea, with a total AHI of 65.9/hour, and O2 nadir of 86%. There was a moderate central sleep apnea component as well. ADDITIONAL INFORMATION:  Height: 69.0 in Weight: 225 lb (BMI 33) Neck Size: 17.5 in    MEDICATIONS: Norvasc, Aspirin, Humalog, Synthroid, Toprol-XL, Crestor, Xigduo XR, Nitrostat   DESCRIPTION: A sleep technologist was in attendance for the duration of the recording.  Data collection, scoring, video monitoring, and reporting were performed in compliance with the AASM Manual for the Scoring of Sleep and Associated Events; (Hypopnea is scored based on the criteria listed in Section VIII D. 1b in the AASM Manual V2.6 using a 4% oxygen desaturation rule or Hypopnea is scored based on the criteria listed in Section VIII D. 1a in the AASM Manual V2.6 using 3% oxygen desaturation and /or arousal rule).  A physician certified by the American Board of Sleep Medicine reviewed each epoch of the study.   SLEEP CONTINUITY AND SLEEP ARCHITECTURE:  Lights off was at 21:02: and lights on 04:56: (7.9 hours in bed). Total sleep time was 294.5  minutes (73.7% supine;  26.3% lateral;  0.0% prone, 28.4% REM sleep), with a decreased sleep efficiency at 62.2%. Sleep latency was increased at 29.0 minutes.  Of the total sleep time, the percentage of stage N1 sleep was 8.5%, which is high normal stage N2 sleep was 55.2%, which is normal, stage N3 sleep was 8.0%, and REM sleep was 28.4%, which is increased, REM latency mildly increased at 122.5 min. Wake after sleep onset (WASO) time accounted for 150 minutes with mild to moderate sleep fragmentation noted.  AROUSAL: There were 82 arousals in total, for an arousal index of 16.7 arousals/hour.  Of these, 38 were identified as respiratory-related arousals (7.7 /h), 4 were PLM-related arousals (0.8 /h), and 54 were non-specific arousals (11.0 /h)  RESPIRATORY MONITORING:  Based on CMS criteria (using a 4% oxygen desaturation rule for scoring hypopneas), there were 7 apneas (5 obstructive; 2 central; 0 mixed), and 89 hypopneas.  Apnea index was 1.4. Hypopnea index was 18.1. The apnea-hypopnea index was 19.6 overall (26.3 supine, 1.6 non-supine; 9.3 REM, 15.5 supine REM). There were 0 respiratory effort-related arousals (RERAs).  The RERA index was 0.0 events/h. Total respiratory disturbance index (RDI) was 19.6 events/h. RDI results showed: supine RDI  26.3 /h; non-supine RDI 0.8 /h; REM RDI 9.3 /h, supine REM RDI 15.5 /h.   Based on AASM criteria (using a 3% oxygen desaturation and /or arousal rule for scoring hypopneas), there were 7 apneas (5 obstructive; 2 central; 0 mixed), and 90 hypopneas. Apnea index was 1.4. Hypopnea index was 18.3. The apnea-hypopnea index was  19.8 overall (26.5 supine, 1.6 non-supine; 10.1 REM, 16.8 supine REM). There were 0 respiratory effort-related arousals (RERAs).  The RERA index was 0.0 events/h. Total respiratory disturbance index (RDI) was 19.8 events/h. RDI results showed: supine RDI  26.5 /h; non-supine RDI 0.8 /h; REM RDI 10.1 /h, supine REM RDI 16.8 /h.  Respiratory events  were associated with oxyhemoglobin desaturations (nadir during sleep 89%) from a mean of 95%). There were 0 occurrences of Cheyne Stokes breathing.  OXIMETRY: Total sleep time spent at, or below 88% was 0.0 minutes, or 0.0% of total sleep time. Snoring was classified as .  BODY POSITION: Duration of total sleep and percent of total sleep in their respective position is as follows: supine 217 minutes (73.7%), non-supine 77.5 minutes (26.3%); right 00 minutes (0.0%), left 77 minutes (26.3%), and prone 00 minutes (0.0%). Total supine REM sleep time was 46 minutes (55.7% of total REM sleep).  LIMB MOVEMENTS: There were 9 periodic limb movements of sleep (1.8/h), of which 4 (0.8/h) were associated with an arousal.      TITRATION DETAILS (SEE ALSO TABLE AT THE END OF THE REPORT):  The patient was fitted with a medium Vitera full face mask from [employer] (home mask [id number] from [employer]). The patient was started on a pressure of 5 cm of water pressure and gradually titrated to a final titration pressure of 12 cm with EPR of 2.  The AHI was improved and optimized at a pressure of 12 cm, on which the patient achieved a total sleep time of 107.5 minutes. Residual AHI was 4.5/hour, O2 nadir of 91%, with supine REM sleep achieved.  He had minimal central events on CPAP of 7 and 9 cm. BiPAP therapy was not initiated for that reason.   EEG: Review of the EEG showed no abnormal electrical discharges and symmetrical bihemispheric findings.  ? EKG: The EKG revealed normal sinus rhythm (NSR). The average heart rate during sleep was 63 bpm.  ? AUDIO/VIDEO REVIEW: The audio and video review did not show any abnormal or unusual behaviors, movements, phonations or vocalizations. The patient took 3 restroom breaks. Snoring was noted, but improved with PAP titration. ? POST-STUDY QUESTIONNAIRE: Post study, the patient indicated, that sleep was better than usual.  ? IMPRESSION:   1.?Severe obstructive Sleep Apnea (OSA) 2.  Central sleep apnea 3.?Dysfunctions associated with sleep stages or arousal from sleep ? RECOMMENDATIONS:   1.?This study demonstrates significant improvement of the patient's obstructive sleep apnea with CPAP therapy. No significant central respiratory events were noted. I recommend home CPAP of 12 cm with EPR of 2, via medium Vitera FFM from [employer], with heated humidity. The patient should be reminded to be fully compliant with PAP therapy to improve sleep related symptoms and decrease long term cardiovascular risks. The patient should be reminded, that it may take up to 3 months to get fully used to using PAP with all planned sleep. The earlier full compliance is achieved, the better long term compliance tends to be. Please note that untreated obstructive sleep apnea carries additional perioperative morbidity. Patients with significant obstructive sleep apnea should receive perioperative PAP therapy and the surgeons and particularly the anesthesiologist should be informed of the diagnosis and the severity of the sleep disordered breathing. 2.?This study shows sleep fragmentation and abnormal sleep stage percentages; however, compared to his baseline sleep study, sleep architecture looked significantly improved. He achieved all stages of sleep, including REM rebound. These are nonspecific findings and per se do not signify an intrinsic  sleep disorder or a cause for the patient's sleep-related symptoms. Causes include (but are not limited to) the first night effect of the sleep study, circadian rhythm disturbances, medication effect or an underlying mood disorder or medical problem.  4.?The patient should be cautioned not to drive, work at heights, or operate dangerous or heavy equipment when tired or sleepy. Review and reiteration of good sleep hygiene measures should be pursued with any patient. 5.?The patient will be seen in follow-up in the sleep clinic at [facility] for discussion of the test results, symptom  and treatment compliance review, further management strategies, etc. The patient and the referring provider will be notified of the test results. ? I certify that I have reviewed the entire raw data recording prior to the issuance of this report in accordance with the Standards of Accreditation of the American Academy of Sleep Medicine (AASM).  Huston Foley, MD, PhD Medical Director, [facility]) Diplomat, ABPN (Neurology and Sleep)               Technical Report:   [facility] CPAP Summary    General Information  Name: Carl Gutierrez, Carl Gutierrez BMI: 16.10 Physician: Huston Foley, MD  ID: [id number] Height: 69.0 in Technician: Margaretann Loveless, RPSGT  Sex: Male Weight: 225.0 lb Record: [id number]  Age: 18 [Feb 16, 1946] Date: 04/26/2023     Medical & Medication History    Today 04/01/23 Saw Dr. Frances Furbish in March 2024 for snoring, excessive daytime somnolence, frequent nocturia. PSG 12/23/2022 showed severe OSA.total AHI of 65.9/hour, and O2 nadir of 86%. There was a moderate central sleep apnea component as well. He does not like CPAP, goes to bed at 11 PM, wakes him up at 3 AM, blowing air, up to his eyes. Using FFM mask. Has not noted any difference in his sleep or daytime energy since wearing CPAP. CPAP download 01/26/2023-03/31/23 shows 100% compliance, greater than 4 hours 97%. Average usage 6 hours 44 minutes. 5-12 cm. EPR level 3. Pressure 95th percentile 9.8, max 10.7. Leak 18.9. AHI 17.5 (central 13, obstructive 1.3, unknown 0.8). 77 year old male with an underlying complex medical history of hyperlipidemia, hypertension, type 2 diabetes, panhypopituitarism, gout, chronic kidney disease, anemia, coronary artery disease with status post angioplasty, and obesity, who reports snoring and excessive daytime somnolence as well as frequent nocturia.  Norvasc, Aspirin, Humalog, Synthroid, Toprol-XL, Crestor, Xigduo XR, Nitrostat    Sleep Disorder      Comments   The patient came into the sleep lab for a CPAP titration study. The patient had a PSG with our sleep lab on 12/23/2022. The total AHI was 65.9/hour, and O2 nadir of 86%. The patient was fitted with [employer]&P Vitera (FFM) size Med. The mask was a good fit to face and the patient tolerated the mask well. The patient said he does not like the mask he uses at home [facility]). CPAP was started at [facility] with EPR of 1. CPAP was increased to 12cmH2O with EPR of 2 due to respiratory events. Tech decreased EPR to 1 to see if it would help with the few respiratory events the patient was having. CPAP end pressure was 12cmH2O with EPR of 1. Three restroom breaks. The patient insisted on using a urinal. EKG did not show any obvious cardiac arrhythmias. Mild snoring. Respiratory events scored with a 4% desat. The patient slept mostly supine. The patient had fragmented sleep. The patient had very few CSA therefore, he was not switched to BiPAP. The  study was ended a little early due to the paitent not initiating sleep after last bathroom break.     CPAP start time: 09:03:05 PM CPAP end time: 04:56:25 AM   Time Total Supine Side Prone Upright  Recording (TRT) 7h 53.65m 6h 29.74m 1h 24.60m 0h 0.72m 0h 0.62m  Sleep (TST) 4h 54.6m 3h 37.55m 1h 17.5m 0h 0.74m 0h 0.62m   Latency N1 N2 N3 REM Onset Per. Slp. Eff.  Actual 0h 0.27m 0h 0.6m 1h 44.108m 2h 2.23m 0h 29.60m 1h 49.10m 62.20%   Stg Dur Wake N1 N2 N3 REM  Total 179.0 25.0 162.5 23.5 83.5  Supine 172.5 23.0 131.5 16.0 46.5  Side 6.5 2.0 31.0 7.5 37.0  Prone 0.0 0.0 0.0 0.0 0.0  Upright 0.0 0.0 0.0 0.0 0.0   Stg % Wake N1 N2 N3 REM  Total 37.8 8.5 55.2 8.0 28.4  Supine 36.4 7.8 44.7 5.4 15.8  Side 1.4 0.7 10.5 2.5 12.6  Prone 0.0 0.0 0.0 0.0 0.0  Upright 0.0 0.0 0.0 0.0 0.0     Apnea Summary Sub Supine Side Prone Upright  Total 7 Total 7 7 0 0 0    REM 4 4 0 0 0    NREM 3 3 0 0 0  Obs 5 REM 3 3 0 0 0    NREM 2 2 0 0 0  Mix 0 REM 0 0 0  0 0    NREM 0 0 0 0 0  Cen 2 REM 1 1 0 0 0    NREM 1 1 0 0 0   Rera Summary Sub Supine Side Prone Upright  Total 0 Total 0 0 0 0 0    REM 0 0 0 0 0    NREM 0 0 0 0 0   Hypopnea Summary Sub Supine Side Prone Upright  Total 90 Total 90 89 1 0 0    REM 10 9 1  0 0    NREM 80 80 0 0 0   4% Hypopnea Summary Sub Supine Side Prone Upright  Total (4%) 89 Total 89 88 1 0 0    REM 9 8 1  0 0    NREM 80 80 0 0 0     AHI Total Obs Mix Cen  19.76 Apnea 1.43 1.02 0.00 0.41   Hypopnea 18.34 -- -- --  19.56 Hypopnea (4%) 18.13 -- -- --    Total Supine Side Prone Upright  Position AHI 19.76 26.54 0.77 0.00 0.00  REM AHI 10.06   NREM AHI 23.60   Position RDI 19.76 26.54 0.77 0.00 0.00  REM RDI 10.06   NREM RDI 23.60    4% Hypopnea Total Supine Side Prone Upright  Position AHI (4%) 19.56 26.27 0.77 0.00 0.00  REM AHI (4%) 9.34   NREM AHI (4%) 23.60   Position RDI (4%) 19.56 26.27 0.77 0.00 0.00  REM RDI (4%) 9.34   NREM RDI (4%) 23.60    Desaturation Information  <100% <90% <80% <70% <60% <50% <40%  Supine 244 13 0 0 0 0 0  Side 11 1 0 0 0 0 0  Prone 0 0 0 0 0 0 0  Upright 0 0 0 0 0 0 0  Total 255 14 0 0 0 0 0  Desaturation threshold setting: 3% Minimum desaturation setting: 10 seconds SaO2 nadir: 85% The longest event was a 26 sec obstructive Hypopnea with a minimum SaO2 of 91%. The lowest SaO2 was 89% associated with  a 14 sec obstructive Hypopnea. EKG Rates EKG Avg Max Min  Awake 66 92 50  Asleep 63 76 54  EKG Events: Tachycardia Awakening/Arousal Information # of Awakenings 32  Wake after sleep onset 150.59m  Wake after persistent sleep 86.60m   Arousal Assoc. Arousals Index  Apneas 1 0.2  Hypopneas 37 7.5  Leg Movements 6 1.2  Snore 0.0 0.0  PTT Arousals 0 0.0  Spontaneous 55 11.2  Total 97 19.8  Myoclonus Information PLMS LMs Index  Total LMs during PLMS 9 1.8  LMs w/ Microarousals 4 0.8   LM LMs Index  w/ Microarousal 2 0.4  w/ Awakening 0 0.0  w/ Resp  Event 0 0.0  Spontaneous 6 1.2  Total 8 1.6      Titration Table:  [facility] CPAP/Bilevel Report    General Information  Name: Carl Gutierrez, Carl Gutierrez BMI: 33 Physician: Huston Foley   ID: [id number] Height: 69 in Technician: Margaretann Loveless  Sex: Male Weight: 225 lb Record: [id number]  Age: 58 [08-Dec-1945] Date: 04/26/2023 Scorer: Zenon Mayo Fields   Recommended Settings IPAP: N/A cmH20 EPAP: N/A cmH2O AHI: N/A AHI (4%): N/A   Pressure IPAP/EPAP 00 05 07 09 10 11 12    O2 Vol 0.0 0.0 0.0 0.0 0.0 0.0 0.0  Time TRT 0.49m 111.61m 19.36m 39.14m 45.74m 102.37m 155.69m   TST 0.27m 18.38m 19.61m 39.50m 21.53m 89.36m 107.56m  Sleep Stage % Wake 0.0 83.4 2.6 0.0 53.3 13.2 30.9   % REM 0.0 0.0 0.0 48.1 38.1 41.6 18.1   % N1 0.0 70.3 15.8 0.0 11.9 5.1 1.9   % N2 0.0 29.7 84.2 15.2 50.0 43.3 80.0   % N3 0.0 0.0 0.0 36.7 0.0 10.1 0.0  Respiratory Total Events 0 22 32 6 24 5 8    Obs. Apn. 0 0 1 0 4 0 0   Mixed Apn. 0 0 0 0 0 0 0   Cen. Apn. 0 0 1 1 0 0 0   Hypopneas 0 22 30 5 20 5 8    AHI 0.00 71.35 101.05 9.11 68.57 3.37 4.47   Supine AHI 0.00 71.35 101.05 9.11 68.57 20.87 4.47   Prone AHI 0.00 0.00 0.00 0.00 0.00 0.00 0.00   Side AHI 0.00 0.00 0.00 0.00 0.00 0.77 0.00  Respiratory (4%) Hypopneas (4%) 0.00 22.00 30.00 5.00 19.00 5.00 8.00   AHI (4%) 0.00 71.35 101.05 9.11 65.71 3.37 4.47   Supine AHI (4%) 0.00 71.35 101.05 9.11 65.71 20.87 4.47   Prone AHI (4%) 0.00 0.00 0.00 0.00 0.00 0.00 0.00   Side AHI (4%) 0.00 0.00 0.00 0.00 0.00 0.77 0.00  Desat Profile <= 90% 0.91m 4.71m 0.58m 0.102m 0.35m 0.24m 1.40m   <= 80% 0.107m 0.22m 0.72m 0.82m 0.90m 0.75m 1.47m   <= 70% 0.53m 0.58m 0.24m 0.16m 0.41m 0.57m 1.79m   <= 60% 0.52m 0.80m 0.39m 0.37m 0.23m 0.30m 1.9m  Arousal Index Apnea 0.0 0.0 0.0 0.0 2.9 0.0 0.0   Hypopnea 0.0 42.2 37.9 0.0 25.7 1.3 0.6   LM 0.0 0.0 0.0 0.0 17.1 0.0 0.0   Spontaneous 0.0 48.6 3.2 3.0 25.7 12.1 5.6

## 2023-04-28 ENCOUNTER — Other Ambulatory Visit: Payer: Self-pay | Admitting: Family Medicine

## 2023-04-28 DIAGNOSIS — I10 Essential (primary) hypertension: Secondary | ICD-10-CM

## 2023-04-30 ENCOUNTER — Other Ambulatory Visit: Payer: Self-pay

## 2023-04-30 DIAGNOSIS — E1165 Type 2 diabetes mellitus with hyperglycemia: Secondary | ICD-10-CM

## 2023-04-30 MED ORDER — DAPAGLIFLOZIN PRO-METFORMIN ER 5-1000 MG PO TB24: 1.0000 | ORAL_TABLET | Freq: Every day | ORAL | 3 refills | Status: DC

## 2023-04-30 MED ORDER — INSULIN LISPRO PROT & LISPRO (75-25 MIX) 100 UNIT/ML KWIKPEN: PEN_INJECTOR | SUBCUTANEOUS | 2 refills | Status: DC

## 2023-05-03 ENCOUNTER — Telehealth: Payer: Self-pay | Admitting: Family Medicine

## 2023-05-03 NOTE — Telephone Encounter (Signed)
Please call radiology and see if they are able to read the CT scan.  Still waiting on report.

## 2023-05-04 NOTE — Telephone Encounter (Signed)
Called Radiology room and they have moved it to the STAT pile, and this will be read by end of day.

## 2023-05-05 ENCOUNTER — Other Ambulatory Visit: Payer: Self-pay | Admitting: Family Medicine

## 2023-05-05 DIAGNOSIS — R918 Other nonspecific abnormal finding of lung field: Secondary | ICD-10-CM

## 2023-05-05 NOTE — Progress Notes (Signed)
See ct results. 3 month repeat ordered.

## 2023-05-05 NOTE — Telephone Encounter (Signed)
Noted, plan per result note.

## 2023-05-11 ENCOUNTER — Telehealth: Payer: Self-pay

## 2023-05-11 NOTE — Telephone Encounter (Signed)
Order sent to [facility]

## 2023-05-25 ENCOUNTER — Other Ambulatory Visit: Payer: Self-pay

## 2023-05-25 DIAGNOSIS — E1165 Type 2 diabetes mellitus with hyperglycemia: Secondary | ICD-10-CM

## 2023-05-25 DIAGNOSIS — E230 Hypopituitarism: Secondary | ICD-10-CM

## 2023-05-26 ENCOUNTER — Other Ambulatory Visit: Payer: Medicare Other

## 2023-05-26 DIAGNOSIS — Z794 Long term (current) use of insulin: Secondary | ICD-10-CM | POA: Diagnosis not present

## 2023-05-26 DIAGNOSIS — E1165 Type 2 diabetes mellitus with hyperglycemia: Secondary | ICD-10-CM | POA: Diagnosis not present

## 2023-05-26 DIAGNOSIS — E230 Hypopituitarism: Secondary | ICD-10-CM | POA: Diagnosis not present

## 2023-05-27 LAB — BASIC METABOLIC PANEL
BUN/Creatinine Ratio: 14 (calc) (ref 6–22)
BUN: 24 mg/dL (ref 7–25)
CO2: 21 mmol/L (ref 20–32)
Calcium: 9.2 mg/dL (ref 8.6–10.3)
Chloride: 110 mmol/L (ref 98–110)
Creat: 1.71 mg/dL — ABNORMAL HIGH (ref 0.70–1.28)
Glucose, Bld: 109 mg/dL — ABNORMAL HIGH (ref 65–99)
Potassium: 4.7 mmol/L (ref 3.5–5.3)
Sodium: 142 mmol/L (ref 135–146)

## 2023-05-27 LAB — HEMOGLOBIN A1C
Hgb A1c MFr Bld: 7 %{Hb} — ABNORMAL HIGH (ref <5.7)
Mean Plasma Glucose: 154 mg/dL
eAG (mmol/L): 8.5 mmol/L

## 2023-05-27 LAB — T4, FREE: Free T4: 1.2 ng/dL (ref 0.8–1.8)

## 2023-06-02 ENCOUNTER — Encounter: Payer: Self-pay | Admitting: Endocrinology

## 2023-06-02 ENCOUNTER — Ambulatory Visit: Payer: Medicare Other | Admitting: Endocrinology

## 2023-06-02 VITALS — BP 138/60 | HR 75 | Resp 20 | Ht 68.0 in | Wt 224.8 lb

## 2023-06-02 DIAGNOSIS — E230 Hypopituitarism: Secondary | ICD-10-CM

## 2023-06-02 DIAGNOSIS — Z794 Long term (current) use of insulin: Secondary | ICD-10-CM

## 2023-06-02 DIAGNOSIS — E2749 Other adrenocortical insufficiency: Secondary | ICD-10-CM | POA: Diagnosis not present

## 2023-06-02 DIAGNOSIS — E118 Type 2 diabetes mellitus with unspecified complications: Secondary | ICD-10-CM

## 2023-06-02 DIAGNOSIS — E038 Other specified hypothyroidism: Secondary | ICD-10-CM

## 2023-06-02 MED ORDER — LEVOTHYROXINE SODIUM 88 MCG PO TABS: 88.0000 ug | ORAL_TABLET | Freq: Every day | ORAL | 3 refills | Status: DC

## 2023-06-02 MED ORDER — ONETOUCH VERIO VI STRP: ORAL_STRIP | 3 refills | Status: AC

## 2023-06-02 MED ORDER — HYDROCORTISONE 20 MG PO TABS: ORAL_TABLET | ORAL | 3 refills | Status: DC

## 2023-06-02 MED ORDER — DAPAGLIFLOZIN PRO-METFORMIN ER 5-1000 MG PO TB24: 1.0000 | ORAL_TABLET | Freq: Every day | ORAL | 3 refills | Status: DC

## 2023-06-02 MED ORDER — INSULIN LISPRO PROT & LISPRO (75-25 MIX) 100 UNIT/ML KWIKPEN: PEN_INJECTOR | SUBCUTANEOUS | 2 refills | Status: DC

## 2023-06-02 MED ORDER — BLOOD GLUCOSE MONITORING SUPPL DEVI: 1.0000 | Freq: Three times a day (TID) | 0 refills | Status: AC

## 2023-06-02 NOTE — Progress Notes (Signed)
Outpatient Endocrinology Note Carl Abryana Lykens, MD  06/02/23  Patient's Name: Carl Gutierrez    DOB: 01/25/1946    MRN: [id number]                                                    REASON OF VISIT: Follow up for type 2 diabetes mellitus /hypopituitarism  PCP: Shade Flood, MD  HISTORY OF PRESENT ILLNESS:   Carl Gutierrez is a 77 y.o. old male with past medical history listed below, is here for follow up for type 2 diabetes mellitus and hypopituitarism/secondary hypothyroidism/secondary adrenal insufficiency.  Pertinent Diabetes History: Patient was previously seen by Dr. Lucianne Muss and was last time seen in August 2024.  Chronic Diabetes Complications : Retinopathy: no ?. Last ophthalmology exam was done on 6-12 months, 05/2023, following with ophthalmology regularly.  Nephropathy: CKD III b, following with nephrology. Peripheral neuropathy: no Coronary artery disease: yes Stroke: no  Relevant comorbidities and cardiovascular risk factors: Obesity: yes Body mass index is 34.18 kg/m.  Hypertension: Yes  Hyperlipidemia : Yes, on statin   Current / Home Diabetic regimen includes:    INSULIN regimen: Humalog 75/25 insulin, 20 units before breakfast and 6 before dinner   Non-insulin hypoglycemic drugs: Xigduo 10/998 mg daily  Prior diabetic medications:  Glycemic data:   Glucometer not working.  He denied CGM in the past.  Hypoglycemia: Patient has no hypoglycemic episodes. Patient has hypoglycemia awareness.  Factors modifying glucose control: 1.  Diabetic diet assessment: 3 meals a day.  2.  Staying active or exercising: Little walking.  Exercise limited due to knee pain.  3.  Medication compliance: compliant all of the time.  # Hypopituitarism : -History of pituitary surgery for pituitary adenoma in 2012.  He has secondary hypothyroidism and secondary adrenal insufficiency, taking levothyroxine.  MCG daily and hydrocortisone 20 mg in the morning and 10 mg in the afternoon  replacement daily. -He has hypogonadotropic hypogonadism, used to be on testosterone supplement in the past in the form of AndroGel and injectable testosterone, currently not on testosterone supplement, patient does not want to try any supplement for previous note.  Interval history  Patient has recent lab with normal electrolytes including serum sodium and potassium.  Hemoglobin A1c 7%.  Renal function stable.  Diabetes regimen as noted above.  No glucose data to review.  He denies any hypoglycemic symptoms.  No other complaints today.  Denies weakness, fatigue, nausea, no headache or any peripheral vision loss.  REVIEW OF SYSTEMS As per history of present illness.   PAST MEDICAL HISTORY: Past Medical History:  Diagnosis Date   CAD S/P percutaneous coronary angioplasty February 2004; 2008   PCI-dLAD: 2.25 mm x 16 mm Express BMS; Ramus- 2.75 mm x 18 mm Cypher DES (2.8 mm); follow-up 11/'08: Patent LAD/ramus stents. 90% small OM. EF 50-60%.; Myoview 1/'12: Exercise 9 min, 10 METS with no ischemia/infarction.    Clotting disorder (HCC)    Diabetes mellitus    Low-dose metformin   DJD (degenerative joint disease), lumbosacral     status post L4-L5 surgery   Essential hypertension    History of DVT of lower extremity     postoperative   Hyperlipidemia with target LDL less than 70    Hypothyroidism    Synthroid   Panhypopituitarism (HCC)    Status post pituitary adenoma removal  in 2012   Recurrent deep vein thrombosis (DVT) (HCC) 10/01/2015    PAST SURGICAL HISTORY: Past Surgical History:  Procedure Laterality Date   ARTHROSCOPY KNEE W/ DRILLING Left    BRAIN SURGERY  06/16/2010   Removal of pituitary adenoma   CARDIAC CATHETERIZATION  04/17/2007   Significant LAD tapering but no significant disease the distal stent. Patent Ramus stent. 90% lesion in small OM 2; small nondominant RCA. Medical therapy. EF 50-60%.   CORONARY ANGIOPLASTY WITH STENT PLACEMENT  07/17/2002   PCI-dLAD:  2.25 mm x 16 mm BMS;;PCI- RI: 2.75 mm 18 mm Cypher DES   IR RADIOLOGIST EVAL & MGMT  11/04/2021   IR RADIOLOGIST EVAL & MGMT  12/31/2021   IR RADIOLOGIST EVAL & MGMT  02/27/2022   IR RADIOLOGIST EVAL & MGMT  03/09/2023   NM MYOVIEW LTD  06/16/2010   a) Exercise ~9 min - 10 METS; no ischemia or infarction; b) 06/2021: LOW RISK.  Normal LV size and function.  EF 55 to 60%.  No RWMA.  No ischemia or infarct.  Fixed inferior basal apical defect with normal motion suggestive of artifact.  (Diaphragmatic attenuation)   PROSTATECTOMY N/A 08/13/2015   Procedure: TRANSVESICLE OPEN PROSTATECTOMY** ;  Surgeon: Jethro Bolus, MD;  Location: [facility];  Service: Urology;  Laterality: N/A;   RADIOLOGY WITH ANESTHESIA N/A 12/04/2021   Procedure: LEFT RENAL CRYO ABLATION;  Surgeon: Irish Lack, MD;  Location: [facility];  Service: Radiology;  Laterality: N/A;   SPINE SURGERY     L4-L5    ALLERGIES: Allergies  Allergen Reactions   Hydrochlorothiazide     Leg and side pain with this   Other     Blood pressure med name unknown.    FAMILY HISTORY:  Family History  Problem Relation Age of Onset   Cancer Mother        pancreatic ca   Sleep apnea Neg Hx     SOCIAL HISTORY: Social History   Socioeconomic History   Marital status: Married    Spouse name: Not on file   Number of children: Not on file   Years of education: Not on file   Highest education level: Not on file  Occupational History   Not on file  Tobacco Use   Smoking status: Former    Current packs/day: 0.00    Types: Cigarettes    Quit date: 06/24/1993    Years since quitting: 29.9   Smokeless tobacco: Never  Vaping Use   Vaping status: Never Used  Substance and Sexual Activity   Alcohol use: No   Drug use: No   Sexual activity: Not Currently    Comment: Local truck driver, 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  Exercises only occasionally. Not as much as he desires 2. Works as a Arts administrator.   He does not smoke cigarettes -- quit in 1995. Does not drink alcohol.   Social Drivers of Corporate investment banker Strain: Low Risk  (10/02/2022)   Overall Financial Resource Strain (CARDIA)    Difficulty of Paying Living Expenses: Not very hard  Food Insecurity: No Food Insecurity (10/02/2022)   Hunger Vital Sign    Worried About Running Out of Food in the Last Year: Never true    Ran Out of Food in the Last Year: Never true  Transportation  Needs: No Transportation Needs (10/02/2022)   PRAPARE - Administrator, Civil Service (Medical): No    Lack of Transportation (Non-Medical): No  Physical Activity: Inactive (10/02/2022)   Exercise Vital Sign    Days of Exercise per Week: 0 days    Minutes of Exercise per Session: 0 min  Stress: No Stress Concern Present (10/02/2022)   [employer] of Occupational Health - Occupational Stress Questionnaire    Feeling of Stress : Not at all  Social Connections: Moderately Integrated (10/02/2022)   Social Connection and Isolation Panel [NHANES]    Frequency of Communication with Friends and Family: Three times a week    Frequency of Social Gatherings with Friends and Family: Twice a week    Attends Religious Services: More than 4 times per year    Active Member of [employer] or Organizations: No    Attends Engineer, structural: Never    Marital Status: Married    MEDICATIONS:  Current Outpatient Medications  Medication Sig Dispense Refill   Alcohol Swabs (PHARMACIST CHOICE ALCOHOL) PADS SMARTSIG:1 Each Topical 4 Times Daily     amLODipine (NORVASC) 10 MG tablet TAKE 1 TABLET BY MOUTH EVERY DAY 90 tablet 1   aspirin 81 MG tablet Take 81 mg by mouth daily. Reported on 09/05/2015     B-D UF III MINI PEN NEEDLES 31G X 5 MM MISC USE FOR INSULIN PEN TWICE A DAY 100 each 7   Blood Glucose Monitoring Suppl DEVI 1 each by Does not apply  route in the morning, at noon, and at bedtime. May substitute to any manufacturer covered by patient's insurance. 1 each 0   metoprolol succinate (TOPROL-XL) 25 MG 24 hr tablet TAKE 1 TABLET (25 MG TOTAL) BY MOUTH DAILY. 90 tablet 1   rosuvastatin (CRESTOR) 40 MG tablet TAKE 1 TABLET BY MOUTH EVERY DAY 90 tablet 1   Dapagliflozin Pro-metFORMIN ER (XIGDUO XR) 10-998 MG TB24 Take 1 tablet by mouth daily. 90 tablet 3   glucose blood (ONETOUCH VERIO) test strip USE AS INSTRUCTED TO CHECK BLOOD SUGAR 2x  DAILY. 300 strip 3   hydrocortisone (CORTEF) 20 MG tablet Take 1 tablet in the morning and half tablet at 5 PM 135 tablet 3   Insulin Lispro Prot & Lispro (HUMALOG 75/25 MIX) (75-25) 100 UNIT/ML Kwikpen INJECT 20 UNITS UNDER THE SKIN BEFORE BREAKFAST AND 6 UNITS BEFORE SUPPER. (REPLACES 70/30) 15 mL 2   levothyroxine (SYNTHROID) 88 MCG tablet Take 1 tablet (88 mcg total) by mouth daily. 90 tablet 3   nitroGLYCERIN (NITROSTAT) 0.4 MG SL tablet Place 1 tablet (0.4 mg total) under the tongue every 5 (five) minutes as needed for chest pain. 90 tablet 3   No current facility-administered medications for this visit.    PHYSICAL EXAM: Vitals:   06/02/23 0805  BP: 138/60  Pulse: 75  Resp: 20  SpO2: 94%  Weight: 224 lb 12.8 oz (102 kg)  Height: 5\' 8"  (1.727 m)   Body mass index is 34.18 kg/m.  Wt Readings from Last 3 Encounters:  06/02/23 224 lb 12.8 oz (102 kg)  04/15/23 225 lb 6.4 oz (102.2 kg)  04/03/23 225 lb (102.1 kg)    General: Well developed, well nourished male in no apparent distress.  HEENT: AT/[place], no external lesions.  Eyes: Conjunctiva clear and no icterus. Neck: Neck supple  Lungs: Respirations not labored Neurologic: Alert, oriented, normal speech Extremities / Skin: Dry. No sores or rashes noted.  Psychiatric: Does not appear  depressed or anxious  Diabetic Foot Exam - Simple   Simple Foot Form Diabetic Foot exam was performed with the following findings: Yes 06/02/2023   8:19 AM  Visual Inspection No deformities, no ulcerations, no other skin breakdown bilaterally: Yes Sensation Testing Intact to touch and monofilament testing bilaterally: Yes Pulse Check See comments: Yes Comments DP 2 + bilaterally.     LABS Reviewed Lab Results  Component Value Date   HGBA1C 7.0 (H) 05/26/2023   HGBA1C 7.0 (H) 01/14/2023   HGBA1C 7.2 (H) 09/11/2022   Lab Results  Component Value Date   FRUCTOSAMINE 253 10/24/2021   FRUCTOSAMINE 231 10/03/2020   FRUCTOSAMINE 254 11/29/2018   Lab Results  Component Value Date   CHOL 93 04/15/2023   HDL 32.40 (L) 04/15/2023   LDLCALC 43 04/15/2023   LDLDIRECT 85.0 05/18/2014   TRIG 88.0 04/15/2023   CHOLHDL 3 04/15/2023   Lab Results  Component Value Date   MICRALBCREAT 8.6 09/11/2022   MICRALBCREAT 4.9 07/29/2022   Lab Results  Component Value Date   CREATININE 1.71 (H) 05/26/2023   Lab Results  Component Value Date   GFR 43.35 (L) 04/15/2023    ASSESSMENT / PLAN  1. Controlled type 2 diabetes mellitus with complication, with long-term current use of insulin (HCC)   2. Secondary hypothyroidism   3. Panhypopituitarism (HCC)   4. Secondary adrenal insufficiency (HCC)   5. Hypogonadotropic hypogonadism (HCC)     Diabetes Mellitus type 2, complicated by CKD. - Diabetic status / severity: Controlled.  Lab Results  Component Value Date   HGBA1C 7.0 (H) 05/26/2023    - Hemoglobin A1c goal : <7%  - Medications: No change.  INSULIN regimen: Humalog 75/25 insulin, 20 units before breakfast and 6 before dinner   Non-insulin hypoglycemic drugs: Xigduo 10/998 mg daily  - Home glucose testing: At least 2 times a day in the morning fasting and at bedtime.  New glucometer prescribed. - Discussed/ Gave Hypoglycemia treatment plan.  # Consult : not required at this time.   # Annual urine for microalbuminuria/ creatinine ratio, no microalbuminuria currently.  He has CKD following with nephrology. Last  Lab  Results  Component Value Date   MICRALBCREAT 8.6 09/11/2022    # Foot check nightly / neuropathy.  # Annual dilated diabetic eye exams.   - Diet: Make healthy diabetic food choices - Life style / activity / exercise: Discussed.  2. Blood pressure  -  BP Readings from Last 1 Encounters:  06/02/23 138/60    - Control is in target.  - No change in current plans.  3. Lipid status / Hyperlipidemia - Last  Lab Results  Component Value Date   LDLCALC 43 04/15/2023   - Continue rosuvastatin 20 mg daily.  # Panhypopituitarism -History of pituitary adenoma status post surgery in 2012. -Secondary adrenal insufficiency : Continue hydrocortisone 20 mg in the morning and 10 mg in the afternoon. -Secondary hypothyroidism : Continue current dose of levothyroxine 88 mcg daily.  Monitor free T4, not TSH. -Hypogonadism: He has has not reported symptoms despite very low testosterone level in the past and does not want to try any supplements.  Diagnoses and all orders for this visit:  Controlled type 2 diabetes mellitus with complication, with long-term current use of insulin (HCC) -     Dapagliflozin Pro-metFORMIN ER (XIGDUO XR) 10-998 MG TB24; Take 1 tablet by mouth daily. -     Insulin Lispro Prot & Lispro (HUMALOG 75/25 MIX) (75-25) 100  UNIT/ML Kwikpen; INJECT 20 UNITS UNDER THE SKIN BEFORE BREAKFAST AND 6 UNITS BEFORE SUPPER. (REPLACES 70/30) -     BASIC METABOLIC PANEL WITH GFR -     Hemoglobin A1c  Secondary hypothyroidism -     levothyroxine (SYNTHROID) 88 MCG tablet; Take 1 tablet (88 mcg total) by mouth daily.  Panhypopituitarism (HCC)  Secondary adrenal insufficiency (HCC)  Hypogonadotropic hypogonadism (HCC)  Other orders -     glucose blood (ONETOUCH VERIO) test strip; USE AS INSTRUCTED TO CHECK BLOOD SUGAR 2x  DAILY. -     hydrocortisone (CORTEF) 20 MG tablet; Take 1 tablet in the morning and half tablet at 5 PM -     Blood Glucose Monitoring Suppl DEVI; 1 each by Does  not apply route in the morning, at noon, and at bedtime. May substitute to any manufacturer covered by patient's insurance.    DISPOSITION Follow up in clinic in 4  months suggested.   All questions answered and patient verbalized understanding of the plan.  Carl Anthonee Gelin, MD [facility] [address] [place], [address] [place], [place] [address] Phone # [phone]  At least part of this note was generated using voice recognition software. Inadvertent word errors may have occurred, which were not recognized during the proofreading process.

## 2023-07-10 ENCOUNTER — Other Ambulatory Visit: Payer: Self-pay | Admitting: Family Medicine

## 2023-07-10 DIAGNOSIS — E038 Other specified hypothyroidism: Secondary | ICD-10-CM

## 2023-07-22 ENCOUNTER — Telehealth: Payer: Self-pay | Admitting: Family Medicine

## 2023-07-22 NOTE — Telephone Encounter (Signed)
 Type of form received:[employer]   Additional comments: House Calls Visit Summary   Received ab:[id number]- Front Desk   Form should be Faxed/mailed to: N/A  Is patient requesting call for pickup:N/A  Form placed: [employer] charge sheet.  Provider will determine charge. N/A  Individual made aware of 3-5 business day turn around No?  FYI Only

## 2023-08-05 ENCOUNTER — Ambulatory Visit
Admission: RE | Admit: 2023-08-05 | Discharge: 2023-08-05 | Disposition: A | Discharge status: Home / Self Care | Payer: Medicare Other | Source: Ambulatory Visit | Attending: Family Medicine | Admitting: Family Medicine

## 2023-08-05 DIAGNOSIS — R599 Enlarged lymph nodes, unspecified: Secondary | ICD-10-CM | POA: Diagnosis not present

## 2023-08-05 DIAGNOSIS — J439 Emphysema, unspecified: Secondary | ICD-10-CM | POA: Diagnosis not present

## 2023-08-05 DIAGNOSIS — R918 Other nonspecific abnormal finding of lung field: Secondary | ICD-10-CM

## 2023-08-05 DIAGNOSIS — R911 Solitary pulmonary nodule: Secondary | ICD-10-CM | POA: Diagnosis not present

## 2023-08-26 ENCOUNTER — Other Ambulatory Visit: Payer: Self-pay | Admitting: Family Medicine

## 2023-08-26 DIAGNOSIS — E785 Hyperlipidemia, unspecified: Secondary | ICD-10-CM

## 2023-09-07 ENCOUNTER — Other Ambulatory Visit: Payer: Self-pay

## 2023-09-07 DIAGNOSIS — N1832 Chronic kidney disease, stage 3b: Secondary | ICD-10-CM | POA: Diagnosis not present

## 2023-09-14 ENCOUNTER — Other Ambulatory Visit: Payer: Medicare Other

## 2023-09-14 DIAGNOSIS — Z794 Long term (current) use of insulin: Secondary | ICD-10-CM | POA: Diagnosis not present

## 2023-09-14 DIAGNOSIS — E1165 Type 2 diabetes mellitus with hyperglycemia: Secondary | ICD-10-CM | POA: Diagnosis not present

## 2023-09-15 LAB — BASIC METABOLIC PANEL WITH GFR
BUN/Creatinine Ratio: 17 (calc) (ref 6–22)
BUN: 27 mg/dL — ABNORMAL HIGH (ref 7–25)
CO2: 23 mmol/L (ref 20–32)
Calcium: 9.2 mg/dL (ref 8.6–10.3)
Chloride: 111 mmol/L — ABNORMAL HIGH (ref 98–110)
Creat: 1.56 mg/dL — ABNORMAL HIGH (ref 0.70–1.28)
Glucose, Bld: 115 mg/dL — ABNORMAL HIGH (ref 65–99)
Potassium: 4.6 mmol/L (ref 3.5–5.3)
Sodium: 141 mmol/L (ref 135–146)
eGFR: 45 mL/min/{1.73_m2} — ABNORMAL LOW (ref >=60)

## 2023-09-15 LAB — HEMOGLOBIN A1C
Hgb A1c MFr Bld: 6.7 %{Hb} — ABNORMAL HIGH (ref <5.7)
Mean Plasma Glucose: 146 mg/dL
eAG (mmol/L): 8.1 mmol/L

## 2023-09-18 ENCOUNTER — Encounter: Payer: Self-pay | Admitting: Endocrinology

## 2023-09-18 ENCOUNTER — Ambulatory Visit: Payer: Medicare Other | Admitting: Endocrinology

## 2023-09-18 VITALS — BP 132/60 | HR 67 | Resp 16 | Ht 68.0 in | Wt 221.2 lb

## 2023-09-18 DIAGNOSIS — E230 Hypopituitarism: Secondary | ICD-10-CM | POA: Diagnosis not present

## 2023-09-18 DIAGNOSIS — E2749 Other adrenocortical insufficiency: Secondary | ICD-10-CM

## 2023-09-18 DIAGNOSIS — E118 Type 2 diabetes mellitus with unspecified complications: Secondary | ICD-10-CM

## 2023-09-18 DIAGNOSIS — E038 Other specified hypothyroidism: Secondary | ICD-10-CM

## 2023-09-18 DIAGNOSIS — Z794 Long term (current) use of insulin: Secondary | ICD-10-CM

## 2023-09-18 LAB — MICROALBUMIN / CREATININE URINE RATIO
Creatinine, Urine: 88 mg/dL (ref 20–320)
Microalb Creat Ratio: 92 mg/g{creat} — ABNORMAL HIGH (ref <30)
Microalb, Ur: 8.1 mg/dL

## 2023-09-18 NOTE — Progress Notes (Signed)
 Outpatient Endocrinology Note [place] Reigan Tolliver, MD  09/18/23  Patient's Name: Carl Gutierrez    DOB: [birth year]    MRN: [id number]                                                    REASON OF VISIT: Follow up for type 2 diabetes mellitus /hypopituitarism  PCP: Shade Flood, MD  HISTORY OF PRESENT ILLNESS:   Carl Gutierrez is a 78 y.o. old male with past medical history listed below, is here for follow up for type 2 diabetes mellitus and hypopituitarism / secondary hypothyroidism/secondary adrenal insufficiency.  Pertinent Diabetes History: Patient was previously seen by Dr. Lucianne Muss and was last time seen in August 2024.  Chronic Diabetes Complications : Retinopathy: no ?. Last ophthalmology exam was done on 6-12 months, 05/2023, following with ophthalmology regularly.  Nephropathy: CKD III b, following with nephrology. Peripheral neuropathy: no Coronary artery disease: yes Stroke: no  Relevant comorbidities and cardiovascular risk factors: Obesity: yes Body mass index is 33.63 kg/m.  Hypertension: Yes  Hyperlipidemia : Yes, on statin   Current / Home Diabetic regimen includes:    INSULIN regimen: Humalog 75/25 insulin, 20 units before breakfast and 6 before dinner   Non-insulin hypoglycemic drugs: Xigduo 10/998 mg daily  Prior diabetic medications:  Glycemic data:   Glucometer not working.  He denied CGM in the past.  Hypoglycemia: Patient has no hypoglycemic episodes. Patient has hypoglycemia awareness.  Factors modifying glucose control: 1.  Diabetic diet assessment: 3 meals a day.  2.  Staying active or exercising: Little walking.  Exercise limited due to knee pain.  3.  Medication compliance: compliant all of the time.  # Hypopituitarism : -History of pituitary surgery for pituitary adenoma in 2012.  He has secondary hypothyroidism and secondary adrenal insufficiency, taking levothyroxine 88  MCG daily and hydrocortisone 20 mg in the morning and 10 mg in the  afternoon replacement daily. -He has hypogonadotropic hypogonadism, used to be on testosterone supplement in the past in the form of AndroGel and injectable testosterone, currently not on testosterone supplement, patient does not want to try any supplement for previous note.  Interval history  Glucometer data as reviewed above.  Diabetes regimen as reviewed and noted above.  Hemoglobin A1c improved to 6.7%.  He is overall feeling good.  He occasionally get GERD symptoms otherwise no complaints today.  He has been taking levothyroxine and hydrocortisone.  Denies nausea, vomiting or headache or any peripheral vision loss.  Recent laboratory results reviewed with normal electrolytes including serum sodium and potassium.  Stable renal function.  REVIEW OF SYSTEMS As per history of present illness.   PAST MEDICAL HISTORY: Past Medical History:  Diagnosis Date   CAD S/P percutaneous coronary angioplasty February 2004; 2008   PCI-dLAD: 2.25 mm x 16 mm Express BMS; Ramus- 2.75 mm x 18 mm Cypher DES (2.8 mm); follow-up 11/'08: Patent LAD/ramus stents. 90% small OM. EF 50-60%.; Myoview 1/'12: Exercise 9 min, 10 METS with no ischemia/infarction.    Clotting disorder (HCC)    Diabetes mellitus    Low-dose metformin   DJD (degenerative joint disease), lumbosacral     status post L4-L5 surgery   Essential hypertension    History of DVT of lower extremity     postoperative   Hyperlipidemia with target LDL less than 70  Hypothyroidism    Synthroid   Panhypopituitarism (HCC)    Status post pituitary adenoma removal in 2012   Recurrent deep vein thrombosis (DVT) (HCC) 10/01/2015    PAST SURGICAL HISTORY: Past Surgical History:  Procedure Laterality Date   ARTHROSCOPY KNEE W/ DRILLING Left    BRAIN SURGERY  06/16/2010   Removal of pituitary adenoma   CARDIAC CATHETERIZATION  04/17/2007   Significant LAD tapering but no significant disease the distal stent. Patent Ramus stent. 90% lesion in small  OM 2; small nondominant RCA. Medical therapy. EF 50-60%.   CORONARY ANGIOPLASTY WITH STENT PLACEMENT  07/17/2002   PCI-dLAD: 2.25 mm x 16 mm BMS;;PCI- RI: 2.75 mm 18 mm Cypher DES   IR RADIOLOGIST EVAL & MGMT  11/04/2021   IR RADIOLOGIST EVAL & MGMT  12/31/2021   IR RADIOLOGIST EVAL & MGMT  02/27/2022   IR RADIOLOGIST EVAL & MGMT  03/09/2023   NM MYOVIEW LTD  06/16/2010   a) Exercise ~9 min - 10 METS; no ischemia or infarction; b) 06/2021: LOW RISK.  Normal LV size and function.  EF 55 to 60%.  No RWMA.  No ischemia or infarct.  Fixed inferior basal apical defect with normal motion suggestive of artifact.  (Diaphragmatic attenuation)   PROSTATECTOMY N/A 08/13/2015   Procedure: TRANSVESICLE OPEN PROSTATECTOMY** ;  Surgeon: Jethro Bolus, MD;  Location: [facility];  Service: Urology;  Laterality: N/A;   RADIOLOGY WITH ANESTHESIA N/A 12/04/2021   Procedure: LEFT RENAL CRYO ABLATION;  Surgeon: Irish Lack, MD;  Location: [facility];  Service: Radiology;  Laterality: N/A;   SPINE SURGERY     L4-L5    ALLERGIES: Allergies  Allergen Reactions   Hydrochlorothiazide     Leg and side pain with this   Other     Blood pressure med name unknown.    FAMILY HISTORY:  Family History  Problem Relation Age of Onset   Cancer Mother        pancreatic ca   Sleep apnea Neg Hx     SOCIAL HISTORY: Social History   Socioeconomic History   Marital status: Married    Spouse name: Not on file   Number of children: Not on file   Years of education: Not on file   Highest education level: Not on file  Occupational History   Not on file  Tobacco Use   Smoking status: Former    Current packs/day: 0.00    Types: Cigarettes    Quit date: 06/24/1993    Years since quitting: 30.2   Smokeless tobacco: Never  Vaping Use   Vaping status: Never Used  Substance and Sexual Activity   Alcohol use: No   Drug use: No   Sexual activity: Not Currently    Comment: Local truck driver, 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 Exercises only occasionally. Not as much as he desires 2. Works as a Arts administrator.   He does not smoke cigarettes -- quit in 1995. Does not drink alcohol.   Social Drivers of Corporate investment banker Strain: Low Risk  (10/02/2022)   Overall Financial Resource Strain (CARDIA)    Difficulty of Paying Living Expenses: Not very hard  Food Insecurity: No Food Insecurity (10/02/2022)   Hunger Vital Sign    Worried About Running Out of Food in the Last Year:  Never true    Ran Out of Food in the Last Year: Never true  Transportation Needs: No Transportation Needs (10/02/2022)   PRAPARE - Administrator, Civil Service (Medical): No    Lack of Transportation (Non-Medical): No  Physical Activity: Inactive (10/02/2022)   Exercise Vital Sign    Days of Exercise per Week: 0 days    Minutes of Exercise per Session: 0 min  Stress: No Stress Concern Present (10/02/2022)   [employer] of Occupational Health - Occupational Stress Questionnaire    Feeling of Stress : Not at all  Social Connections: Moderately Integrated (10/02/2022)   Social Connection and Isolation Panel [NHANES]    Frequency of Communication with Friends and Family: Three times a week    Frequency of Social Gatherings with Friends and Family: Twice a week    Attends Religious Services: More than 4 times per year    Active Member of [employer] or Organizations: No    Attends Engineer, structural: Never    Marital Status: Married    MEDICATIONS:  Current Outpatient Medications  Medication Sig Dispense Refill   Alcohol Swabs (PHARMACIST CHOICE ALCOHOL) PADS SMARTSIG:1 Each Topical 4 Times Daily     amLODipine (NORVASC) 10 MG tablet TAKE 1 TABLET BY MOUTH EVERY DAY 90 tablet 1   aspirin 81 MG tablet Take 81 mg by mouth daily. Reported on 09/05/2015     B-D UF III MINI PEN NEEDLES 31G X  5 MM MISC USE FOR INSULIN PEN TWICE A DAY 100 each 7   Blood Glucose Calibration (ACCU-CHEK GUIDE CONTROL) LIQD      Blood Glucose Monitoring Suppl DEVI 1 each by Does not apply route in the morning, at noon, and at bedtime. May substitute to any manufacturer covered by patient's insurance. 1 each 0   Dapagliflozin Pro-metFORMIN ER (XIGDUO XR) 10-998 MG TB24 Take 1 tablet by mouth daily. 90 tablet 3   glucose blood (ONETOUCH VERIO) test strip USE AS INSTRUCTED TO CHECK BLOOD SUGAR 2x  DAILY. 300 strip 3   hydrocortisone (CORTEF) 20 MG tablet Take 1 tablet in the morning and half tablet at 5 PM 135 tablet 3   Insulin Lispro Prot & Lispro (HUMALOG 75/25 MIX) (75-25) 100 UNIT/ML Kwikpen INJECT 20 UNITS UNDER THE SKIN BEFORE BREAKFAST AND 6 UNITS BEFORE SUPPER. (REPLACES 70/30) 15 mL 2   Lancet Devices (SIMPLE DIAGNOSTICS LANCING DEV) MISC Apply topically.     levothyroxine (SYNTHROID) 88 MCG tablet Take 1 tablet (88 mcg total) by mouth daily. 90 tablet 3   metoprolol succinate (TOPROL-XL) 25 MG 24 hr tablet TAKE 1 TABLET (25 MG TOTAL) BY MOUTH DAILY. 90 tablet 1   Pharmacist Choice Lancets MISC      rosuvastatin (CRESTOR) 40 MG tablet TAKE 1 TABLET BY MOUTH EVERY DAY 90 tablet 1   nitroGLYCERIN (NITROSTAT) 0.4 MG SL tablet Place 1 tablet (0.4 mg total) under the tongue every 5 (five) minutes as needed for chest pain. 90 tablet 3   No current facility-administered medications for this visit.    PHYSICAL EXAM: Vitals:   09/18/23 0807  BP: 132/60  Pulse: 67  Resp: 16  SpO2: 96%  Weight: 221 lb 3.2 oz (100.3 kg)  Height: 5\' 8"  (1.727 m)    Body mass index is 33.63 kg/m.  Wt Readings from Last 3 Encounters:  09/18/23 221 lb 3.2 oz (100.3 kg)  06/02/23 224 lb 12.8 oz (102 kg)  04/15/23 225 lb 6.4 oz (102.2  kg)    General: Well developed, well nourished male in no apparent distress.  HEENT: AT/[place], no external lesions.  Eyes: Conjunctiva clear and no icterus. Neck: Neck supple  Lungs:  Respirations not labored Neurologic: Alert, oriented, normal speech Extremities / Skin: Dry. No sores or rashes noted.  Psychiatric: Does not appear depressed or anxious  Diabetic Foot Exam - Simple   No data filed    LABS Reviewed Lab Results  Component Value Date   HGBA1C 6.7 (H) 09/14/2023   HGBA1C 7.0 (H) 05/26/2023   HGBA1C 7.0 (H) 01/14/2023   Lab Results  Component Value Date   FRUCTOSAMINE 253 10/24/2021   FRUCTOSAMINE 231 10/03/2020   FRUCTOSAMINE 254 11/29/2018   Lab Results  Component Value Date   CHOL 93 04/15/2023   HDL 32.40 (L) 04/15/2023   LDLCALC 43 04/15/2023   LDLDIRECT 85.0 05/18/2014   TRIG 88.0 04/15/2023   CHOLHDL 3 04/15/2023   Lab Results  Component Value Date   MICRALBCREAT 8.6 09/11/2022   MICRALBCREAT 4.9 07/29/2022   Lab Results  Component Value Date   CREATININE 1.56 (H) 09/14/2023   Lab Results  Component Value Date   GFR 43.35 (L) 04/15/2023    ASSESSMENT / PLAN  1. Controlled type 2 diabetes mellitus with complication, with long-term current use of insulin (HCC)   2. Secondary hypothyroidism   3. Secondary adrenal insufficiency (HCC)   4. Panhypopituitarism (HCC)   5. Hypogonadotropic hypogonadism (HCC)      Diabetes Mellitus type 2, complicated by CKD. - Diabetic status / severity: Controlled.  Lab Results  Component Value Date   HGBA1C 6.7 (H) 09/14/2023    - Hemoglobin A1c goal : <7%  - Medications: No change.  INSULIN regimen: Humalog 75/25 insulin, 20 units before breakfast and 6 before dinner   Non-insulin hypoglycemic drugs: Xigduo 10/998 mg daily  - Home glucose testing: At least 2 times a day in the morning fasting and at bedtime.   - Discussed/ Gave Hypoglycemia treatment plan.  # Consult : not required at this time.   # Annual urine for microalbuminuria/ creatinine ratio, no microalbuminuria currently.  He has CKD following with nephrology.  Will check urine microalbumin creatinine ratio  today. Last  Lab Results  Component Value Date   MICRALBCREAT 8.6 09/11/2022    # Foot check nightly / neuropathy.  # Annual dilated diabetic eye exams.   - Diet: Make healthy diabetic food choices - Life style / activity / exercise: Discussed.  2. Blood pressure  -  BP Readings from Last 1 Encounters:  09/18/23 132/60    - Control is in target.  - No change in current plans.  3. Lipid status / Hyperlipidemia - Last  Lab Results  Component Value Date   LDLCALC 43 04/15/2023   - Continue rosuvastatin 20 mg daily.  # Panhypopituitarism -History of pituitary adenoma status post surgery in 2012. -Secondary adrenal insufficiency : Continue hydrocortisone 20 mg in the morning and 10 mg in the afternoon. -Secondary hypothyroidism : Continue current dose of levothyroxine 88 mcg daily.  Monitor free T4, not TSH, annually. -Hypogonadism: He has has not reported symptoms despite very low testosterone level in the past and does not want to try any supplements.  Diagnoses and all orders for this visit:  Controlled type 2 diabetes mellitus with complication, with long-term current use of insulin (HCC) -     Microalbumin / creatinine urine ratio  Secondary hypothyroidism  Secondary adrenal insufficiency (HCC)  Panhypopituitarism (  HCC)  Hypogonadotropic hypogonadism (HCC)     DISPOSITION Follow up in clinic in 4  months suggested.  Labs on the same day of the visit.   All questions answered and patient verbalized understanding of the plan.  [place] Joshu Furukawa, MD [facility] [address] [place], [address] [place], [place] [address] Phone # [phone]  At least part of this note was generated using voice recognition software. Inadvertent word errors may have occurred, which were not recognized during the proofreading process.

## 2023-10-06 ENCOUNTER — Other Ambulatory Visit: Payer: Self-pay | Admitting: Family Medicine

## 2023-10-06 DIAGNOSIS — I10 Essential (primary) hypertension: Secondary | ICD-10-CM

## 2023-10-12 ENCOUNTER — Other Ambulatory Visit: Payer: Self-pay | Admitting: Family Medicine

## 2023-10-12 DIAGNOSIS — E038 Other specified hypothyroidism: Secondary | ICD-10-CM

## 2023-10-20 ENCOUNTER — Ambulatory Visit (INDEPENDENT_AMBULATORY_CARE_PROVIDER_SITE_OTHER): Admitting: *Deleted

## 2023-10-20 ENCOUNTER — Other Ambulatory Visit: Payer: Self-pay | Admitting: Family Medicine

## 2023-10-20 DIAGNOSIS — Z0000 Encounter for general adult medical examination without abnormal findings: Secondary | ICD-10-CM

## 2023-10-20 DIAGNOSIS — I10 Essential (primary) hypertension: Secondary | ICD-10-CM

## 2023-10-20 NOTE — Progress Notes (Signed)
 Subjective:   Carl Gutierrez is a 78 y.o. male who presents for Medicare Annual/Subsequent preventive examination.  Visit Complete: Virtual I connected with  Carl Gutierrez on 10/20/23 by a audio enabled telemedicine application and verified that I am speaking with the correct person using two identifiers.  Patient Location: Home  Provider Location: Home Office  I discussed the limitations of evaluation and management by telemedicine. The patient expressed understanding and agreed to proceed.  Vital Signs: Because this visit was a virtual/telehealth visit, some criteria may be missing or patient reported. Any vitals not documented were not able to be obtained and vitals that have been documented are patient reported.   Cardiac Risk Factors include: advanced age (>49men, >5 women);hypertension;male gender;diabetes mellitus     Objective:    There were no vitals filed for this visit. There is no height or weight on file to calculate BMI.     10/20/2023    9:29 AM 10/02/2022   10:39 AM 11/28/2021   11:11 AM 09/26/2021   10:59 AM 11/28/2019    8:35 AM 11/23/2018    8:16 AM 11/16/2017    8:14 AM  Advanced Directives  Does Patient Have a Medical Advance Directive? Yes Yes Yes Yes Yes Yes Yes  Type of Sales promotion account executive of Attorney Living will Healthcare Power of [place];Living will Healthcare Power of [place];Living will Healthcare Power of [place];Living will Healthcare Power of [place];Living will  Does patient want to make changes to medical advance directive?  No - Patient declined    No - Patient declined Yes (MAU/Ambulatory/Procedural Areas - Information given)  Copy of Healthcare Power of Attorney in Chart? No - copy requested Yes - validated most recent copy scanned in chart (See row information)  No - copy requested Yes - validated most recent copy scanned in chart (See row information) No - copy requested Yes    Current Medications  (verified) Outpatient Encounter Medications as of 10/20/2023  Medication Sig   Alcohol Swabs (PHARMACIST CHOICE ALCOHOL) PADS SMARTSIG:1 Each Topical 4 Times Daily   amLODipine  (NORVASC ) 10 MG tablet TAKE 1 TABLET BY MOUTH EVERY DAY   aspirin 81 MG tablet Take 81 mg by mouth daily. Reported on 09/05/2015   B-D UF III MINI PEN NEEDLES 31G X 5 MM MISC USE FOR INSULIN  PEN TWICE A DAY   Blood Glucose Calibration (ACCU-CHEK GUIDE CONTROL) LIQD    Blood Glucose Monitoring Suppl DEVI 1 each by Does not apply route in the morning, at noon, and at bedtime. May substitute to any manufacturer covered by patient's insurance.   Dapagliflozin  Pro-metFORMIN  ER (XIGDUO  XR) 10-998 MG TB24 Take 1 tablet by mouth daily.   glucose blood (ONETOUCH VERIO) test strip USE AS INSTRUCTED TO CHECK BLOOD SUGAR 2x  DAILY.   hydrocortisone  (CORTEF ) 20 MG tablet Take 1 tablet in the morning and half tablet at 5 PM   Insulin  Lispro Prot & Lispro (HUMALOG  75/25 MIX) (75-25) 100 UNIT/ML Kwikpen INJECT 20 UNITS UNDER THE SKIN BEFORE BREAKFAST AND 6 UNITS BEFORE SUPPER. (REPLACES 70/30)   Lancet Devices (SIMPLE DIAGNOSTICS LANCING DEV) MISC Apply topically.   levothyroxine  (SYNTHROID ) 88 MCG tablet Take 1 tablet (88 mcg total) by mouth daily.   metoprolol  succinate (TOPROL -XL) 25 MG 24 hr tablet TAKE 1 TABLET (25 MG TOTAL) BY MOUTH DAILY.   Pharmacist Choice Lancets MISC    rosuvastatin  (CRESTOR ) 40 MG tablet TAKE 1 TABLET BY MOUTH EVERY DAY   nitroGLYCERIN  (NITROSTAT ) 0.4  MG SL tablet Place 1 tablet (0.4 mg total) under the tongue every 5 (five) minutes as needed for chest pain.   No facility-administered encounter medications on file as of 10/20/2023.    Allergies (verified) Hydrochlorothiazide  and Other   History: Past Medical History:  Diagnosis Date   CAD S/P percutaneous coronary angioplasty February 2004; 2008   PCI-dLAD: 2.25 mm x 16 mm Express BMS; Ramus- 2.75 mm x 18 mm Cypher DES (2.8 mm); follow-up 11/'08: Patent  LAD/ramus stents. 90% small OM. EF 50-60%.; Myoview  1/'12: Exercise 9 min, 10 METS with no ischemia/infarction.    Clotting disorder (HCC)    Diabetes mellitus    Low-dose metformin    DJD (degenerative joint disease), lumbosacral     status post L4-L5 surgery   Essential hypertension    History of DVT of lower extremity     postoperative   Hyperlipidemia with target LDL less than 70    Hypothyroidism    Synthroid    Panhypopituitarism (HCC)    Status post pituitary adenoma removal in 2012   Recurrent deep vein thrombosis (DVT) (HCC) 10/01/2015   Past Surgical History:  Procedure Laterality Date   ARTHROSCOPY KNEE W/ DRILLING Left    BRAIN SURGERY  06/16/2010   Removal of pituitary adenoma   CARDIAC CATHETERIZATION  04/17/2007   Significant LAD tapering but no significant disease the distal stent. Patent Ramus stent. 90% lesion in small OM 2; small nondominant RCA. Medical therapy. EF 50-60%.   CORONARY ANGIOPLASTY WITH STENT PLACEMENT  07/17/2002   PCI-dLAD: 2.25 mm x 16 mm BMS;;PCI- RI: 2.75 mm 18 mm Cypher DES   IR RADIOLOGIST EVAL & MGMT  11/04/2021   IR RADIOLOGIST EVAL & MGMT  12/31/2021   IR RADIOLOGIST EVAL & MGMT  02/27/2022   IR RADIOLOGIST EVAL & MGMT  03/09/2023   NM MYOVIEW  LTD  06/16/2010   a) Exercise ~9 min - 10 METS; no ischemia or infarction; b) 06/2021: LOW RISK.  Normal LV size and function.  EF 55 to 60%.  No RWMA.  No ischemia or infarct.  Fixed inferior basal apical defect with normal motion suggestive of artifact.  (Diaphragmatic attenuation)   PROSTATECTOMY N/A 08/13/2015   Procedure: TRANSVESICLE OPEN PROSTATECTOMY** ;  Surgeon: Annamarie Kid, MD;  Location: [facility];  Service: Urology;  Laterality: N/A;   RADIOLOGY WITH ANESTHESIA N/A 12/04/2021   Procedure: LEFT RENAL CRYO ABLATION;  Surgeon: Erica Hau, MD;  Location: [facility];  Service: Radiology;  Laterality: N/A;   SPINE SURGERY     L4-L5   Family History  Problem Relation Age of Onset   Cancer  Mother        pancreatic ca   Sleep apnea Neg Hx    Social History   Socioeconomic History   Marital status: Married    Spouse name: Not on file   Number of children: Not on file   Years of education: Not on file   Highest education level: Not on file  Occupational History   Not on file  Tobacco Use   Smoking status: Former    Current packs/day: 0.00    Types: Cigarettes    Quit date: 06/24/1993    Years since quitting: 30.3   Smokeless tobacco: Never  Vaping Use   Vaping status: Never Used  Substance and Sexual Activity   Alcohol use: No   Drug use: No   Sexual activity: Not Currently    Comment: Local truck driver, 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 Exercises only occasionally. Not as much as he desires 2. Works as a Arts administrator.   He does not smoke cigarettes -- quit in 1995. Does not drink alcohol.   Social Drivers of Corporate investment banker Strain: Low Risk  (10/20/2023)   Overall Financial Resource Strain (CARDIA)    Difficulty of Paying Living Expenses: Not hard at all  Food Insecurity: No Food Insecurity (10/20/2023)   Hunger Vital Sign    Worried About Running Out of Food in the Last Year: Never true    Ran Out of Food in the Last Year: Never true  Transportation Needs: No Transportation Needs (10/20/2023)   PRAPARE - Administrator, Civil Service (Medical): No    Lack of Transportation (Non-Medical): No  Physical Activity: Inactive (10/20/2023)   Exercise Vital Sign    Days of Exercise per Week: 0 days    Minutes of Exercise per Session: 0 min  Stress: No Stress Concern Present (10/20/2023)   [employer] of Occupational Health - Occupational Stress Questionnaire    Feeling of Stress : Not at all  Social Connections: Unknown (10/20/2023)   Social Connection and Isolation Panel [NHANES]    Frequency of Communication with  Friends and Family: More than three times a week    Frequency of Social Gatherings with Friends and Family: More than three times a week    Attends Religious Services: More than 4 times per year    Active Member of [employer] or Organizations: Not on file    Attends Banker Meetings: Never    Marital Status: Not on file    Tobacco Counseling Counseling given: Not Answered   Clinical Intake:  Pre-visit preparation completed: Yes  Pain : No/denies pain     Diabetes: Yes CBG done?: No Did pt. bring in CBG monitor from home?: No  How often do you need to have someone help you when you read instructions, pamphlets, or other written materials from your doctor or pharmacy?: 1 - Never  Interpreter Needed?: No  Information entered by :: Kieth Pelt LPN   Activities of Daily Living    10/20/2023    9:30 AM  In your present state of health, do you have any difficulty performing the following activities:  Hearing? 0  Vision? 0  Difficulty concentrating or making decisions? 0  Walking or climbing stairs? 0  Dressing or bathing? 0  Doing errands, shopping? 0  Preparing Food and eating ? N  Using the Toilet? N  In the past six months, have you accidently leaked urine? N  Do you have problems with loss of bowel control? N  Managing your Medications? N  Managing your Finances? N  Housekeeping or managing your Housekeeping? N    Patient Care Team: Benjiman Bras, MD as PCP - General (Family Medicine) Arleen Lacer, MD as PCP - Cardiology (Cardiology)  Indicate any recent Medical Services you may have received from other than [facility] providers in the past year (date may be approximate).     Assessment:   This is a routine wellness examination for [place].  Hearing/Vision screen Hearing Screening - Comments:: Some trouble hearing  Does not wear hearing [facility] Screening - [facility]:: [facility] Up [facility]   Goals Addressed   None    Depression Screen  10/20/2023     9:32 AM 04/15/2023    8:11 AM 10/13/2022    1:01 PM 10/02/2022   10:44 AM 07/29/2022    7:59 AM 12/23/2021    9:43 AM 09/26/2021   10:59 AM  PHQ 2/9 Scores  PHQ - 2 Score 0 0 0 0 0 0 0  PHQ- 9 Score 4 1 0 1 2      Fall Risk    10/20/2023    9:26 AM 04/15/2023    8:11 AM 10/13/2022    1:01 PM 10/02/2022   10:39 AM 07/29/2022    7:59 AM  Fall Risk   Falls in the past year? 0 0 0 0 0  Number falls in past yr: 0 0 0 0 0  Injury with Fall? 0 0 0 0 0  Risk for fall due to :  No Fall Risks No Fall Risks  No Fall Risks  Follow up Falls evaluation completed;Education provided;Falls prevention discussed Falls evaluation completed Falls evaluation completed Falls evaluation completed;Education provided;Falls prevention discussed Falls evaluation completed    MEDICARE RISK AT HOME: Medicare Risk at Home Any stairs in or around the home?: No If so, are there any without handrails?: No Home free of loose throw rugs in walkways, pet beds, electrical cords, etc?: Yes Adequate lighting in your home to reduce risk of falls?: Yes Life alert?: No Use of a cane, walker or w/c?: No Grab bars in the bathroom?: No Shower chair or bench in shower?: No Elevated toilet seat or a handicapped toilet?: No  TIMED UP AND GO:  Was the test performed?  No    Cognitive Function:        10/20/2023    9:29 AM 10/02/2022   10:40 AM 11/28/2019    8:32 AM 11/23/2018    8:22 AM 11/16/2017    8:10 AM  6CIT Screen  What Year? 0 points 0 points 0 points 0 points 0 points  What month? 0 points 0 points 0 points 0 points 0 points  What time? 0 points 0 points 0 points 0 points 0 points  Count back from 20 0 points 0 points 0 points 0 points 0 points  Months in reverse 0 points 0 points 0 points 0 points 0 points  Repeat phrase 2 points 0 points 0 points 0 points 0 points  Total Score 2 points 0 points 0 points 0 points 0 points    Immunizations Immunization History  Administered Date(s) Administered   Fluad  Quad(high Dose 65+) 02/20/2019, 02/27/2020, 03/06/2022   Fluad Trivalent(High Dose 65+) 03/05/2018   Fluzone Influenza virus vaccine,trivalent (IIV3), split virus 02/14/2014   Influenza Split 03/05/2020   Influenza, High Dose Seasonal PF 05/11/2009, 03/17/2015, 04/03/2015, 03/16/2016, 04/02/2017   Influenza-Unspecified 03/16/2008, 05/11/2009, 04/16/2010, 02/15/2011, 04/04/2012, 03/16/2013, 04/05/2014, 04/03/2015, 04/22/2016, 03/07/2021   Moderna Covid-19 Fall Seasonal Vaccine 31yrs & older 03/26/2022   Moderna Sars-Covid-2 Vaccination 07/19/2019, 08/18/2019, 04/12/2020, 09/28/2020   Pneumococcal Conjugate-13 01/22/2015, 06/22/2015   Pneumococcal Polysaccharide-23 06/16/2009   Pneumococcal-Unspecified 09/18/2009   Rsv, Bivalent, Protein Subunit Rsvpref,pf Pattricia Bores) 02/10/2023   Td (Adult),5 Lf Tetanus Toxid, Preservative Free 02/10/2023   Tdap 06/16/2005, 11/14/2008   Zoster Recombinant(Shingrix) 02/20/2022, 05/01/2022    TDAP status: Due, Education has been provided regarding the importance of this vaccine. Advised may receive this vaccine at local pharmacy or Health Dept. Aware to provide a copy of the vaccination record if obtained from local pharmacy or Health Dept. Verbalized acceptance and understanding.  Flu Vaccine status:  Up to date  Pneumococcal vaccine status: Up to date  Covid-19 vaccine status: Information provided on how to obtain vaccines.   Qualifies for Shingles Vaccine? No   Zostavax completed Yes   Shingrix Completed?: Yes  Screening Tests Health Maintenance  Topic Date Due   Hepatitis C Screening  Never done   OPHTHALMOLOGY EXAM  07/24/2021   COVID-19 Vaccine (6 - 2024-25 season) 02/15/2023   INFLUENZA VACCINE  01/15/2024   HEMOGLOBIN A1C  03/15/2024   FOOT EXAM  06/01/2024   Diabetic kidney evaluation - eGFR measurement  09/13/2024   Diabetic kidney evaluation - Urine ACR  09/17/2024   Medicare Annual Wellness (AWV)  10/19/2024   Pneumonia Vaccine 46+  Years old  Completed   Zoster Vaccines- Shingrix  Completed   HPV VACCINES  Aged Out   Meningococcal B Vaccine  Aged Out   DTaP/Tdap/Td  Discontinued   Colonoscopy  Discontinued    Health Maintenance  Health Maintenance Due  Topic Date Due   Hepatitis C Screening  Never done   OPHTHALMOLOGY EXAM  07/24/2021   COVID-19 Vaccine (6 - 2024-25 season) 02/15/2023    Colorectal cancer screening: No longer required.   Lung Cancer Screening: (Low Dose CT Chest recommended if Age 19-80 years, 20 pack-year currently smoking OR have quit w/in 15years.) does not qualify.   Lung Cancer Screening Referral:   Additional Screening:  Hepatitis C Screening: does not qualify; Completed 2024  Vision Screening: Recommended annual ophthalmology exams for early detection of glaucoma and other disorders of the eye. Is the patient up to date with their annual eye exam?  Yes  Who is the provider or what is the name of the office in which the patient attends annual eye exams? [facility] If pt is not established with a provider, would they like to be referred to a provider to establish care? No .   Dental Screening: Recommended annual dental exams for proper oral hygiene  Nutrition Risk Assessment:  Has the patient had any N/V/D within the last 2 months?  No  Does the patient have any non-healing wounds?  No  Has the patient had any unintentional weight loss or weight gain?  No   Diabetes:  Is the patient diabetic?  Yes  If diabetic, was a CBG obtained today?  No  Did the patient bring in their glucometer from home?  No  How often do you monitor your CBG's? 2 times a week.   Financial Strains and Diabetes Management:  Are you having any financial strains with the device, your supplies or your medication? No .  Does the patient want to be seen by Chronic Care Management for management of their diabetes?  No  Would the patient like to be referred to a Nutritionist or for Diabetic Management?  No    Diabetic Exams:  Diabetic Eye Exam:  Pt has been advised about the importance in completing this exam.   Diabetic Foot Exam: . Pt has been advised about the importance in completing this exam..    Community Resource Referral / Chronic Care Management: CRR required this visit?  No   CCM required this visit?  Appt scheduled with PCP     Plan:     I have personally reviewed and noted the following in the patient's chart:   Medical and social history Use of alcohol, tobacco or illicit drugs  Current medications and supplements including opioid prescriptions. Patient is not currently taking opioid prescriptions. Functional ability and status Nutritional  status Physical activity Advanced directives List of other physicians Hospitalizations, surgeries, and ER visits in previous 12 months Vitals Screenings to include cognitive, depression, and falls Referrals and appointments  In addition, I have reviewed and discussed with patient certain preventive protocols, quality metrics, and best practice recommendations. A written personalized care plan for preventive services as well as general preventive health recommendations were provided to patient.     Kieth Pelt, LPN   02/14/4781   After Visit Summary:   Nurse Notes:

## 2023-10-20 NOTE — Patient Instructions (Signed)
 [name] , Thank you for taking time to come for your Medicare Wellness Visit. I appreciate your ongoing commitment to your health goals. Please review the following plan we discussed and let me know if I can assist you in the future.   Screening recommendations/referrals:  Recommended yearly ophthalmology/optometry visit for glaucoma screening and checkup Recommended yearly dental visit for hygiene and checkup  Vaccinations: Influenza vaccine:  Pneumococcal vaccine:  Tdap vaccine:  Shingles vaccine:     Preventive Care 65 Years and Older, Male Preventive care refers to lifestyle choices and visits with your health care provider that can promote health and wellness. What does preventive care include? A yearly physical exam. This is also called an annual well check. Dental exams once or twice a year. Routine eye exams. Ask your health care provider how often you should have your eyes checked. Personal lifestyle choices, including: Daily care of your teeth and gums. Regular physical activity. Eating a healthy diet. Avoiding tobacco and drug use. Limiting alcohol use. Practicing safe sex. Taking low doses of aspirin every day. Taking vitamin and mineral supplements as recommended by your health care provider. What happens during an annual well check? The services and screenings done by your health care provider during your annual well check will depend on your age, overall health, lifestyle risk factors, and family history of disease. Counseling  Your health care provider may ask you questions about your: Alcohol use. Tobacco use. Drug use. Emotional well-being. Home and relationship well-being. Sexual activity. Eating habits. History of falls. Memory and ability to understand (cognition). Work and work Astronomer. Screening  You may have the following tests or measurements: Height, weight, and BMI. Blood pressure. Lipid and cholesterol levels. These may be checked every  5 years, or more frequently if you are over 15 years old. Skin check. Lung cancer screening. You may have this screening every year starting at age 93 if you have a 30-pack-year history of smoking and currently smoke or have quit within the past 15 years. Fecal occult blood test (FOBT) of the stool. You may have this test every year starting at age 64. Flexible sigmoidoscopy or colonoscopy. You may have a sigmoidoscopy every 5 years or a colonoscopy every 10 years starting at age 33. Prostate cancer screening. Recommendations will vary depending on your family history and other risks. Hepatitis C blood test. Hepatitis B blood test. Sexually transmitted disease (STD) testing. Diabetes screening. This is done by checking your blood sugar (glucose) after you have not eaten for a while (fasting). You may have this done every 1-3 years. Abdominal aortic aneurysm (AAA) screening. You may need this if you are a current or former smoker. Osteoporosis. You may be screened starting at age 10 if you are at high risk. Talk with your health care provider about your test results, treatment options, and if necessary, the need for more tests. Vaccines  Your health care provider may recommend certain vaccines, such as: Influenza vaccine. This is recommended every year. Tetanus, diphtheria, and acellular pertussis (Tdap, Td) vaccine. You may need a Td booster every 10 years. Zoster vaccine. You may need this after age 72. Pneumococcal 13-valent conjugate (PCV13) vaccine. One dose is recommended after age 62. Pneumococcal polysaccharide (PPSV23) vaccine. One dose is recommended after age 77. Talk to your health care provider about which screenings and vaccines you need and how often you need them. This information is not intended to replace advice given to you by your health care provider. Make sure  you discuss any questions you have with your health care provider. Document Released: 06/29/2015 Document Revised:  02/20/2016 Document Reviewed: 04/03/2015 Elsevier Interactive Patient Education  2017 [employer].  Fall Prevention in the Home Falls can cause injuries. They can happen to people of all ages. There are many things you can do to make your home safe and to help prevent falls. What can I do on the outside of my home? Regularly fix the edges of walkways and driveways and fix any cracks. Remove anything that might make you trip as you walk through a door, such as a raised step or threshold. Trim any bushes or trees on the path to your home. Use bright outdoor lighting. Clear any walking paths of anything that might make someone trip, such as rocks or tools. Regularly check to see if handrails are loose or broken. Make sure that both sides of any steps have handrails. Any raised decks and porches should have guardrails on the edges. Have any leaves, snow, or ice cleared regularly. Use sand or salt on walking paths during winter. Clean up any spills in your garage right away. This includes oil or grease spills. What can I do in the bathroom? Use night lights. Install grab bars by the toilet and in the tub and shower. Do not use towel bars as grab bars. Use non-skid mats or decals in the tub or shower. If you need to sit down in the shower, use a plastic, non-slip stool. Keep the floor dry. Clean up any water that spills on the floor as soon as it happens. Remove soap buildup in the tub or shower regularly. Attach bath mats securely with double-sided non-slip rug tape. Do not have throw rugs and other things on the floor that can make you trip. What can I do in the bedroom? Use night lights. Make sure that you have a light by your bed that is easy to reach. Do not use any sheets or blankets that are too big for your bed. They should not hang down onto the floor. Have a firm chair that has side arms. You can use this for support while you get dressed. Do not have throw rugs and other things  on the floor that can make you trip. What can I do in the kitchen? Clean up any spills right away. Avoid walking on wet floors. Keep items that you use a lot in easy-to-reach places. If you need to reach something above you, use a strong step stool that has a grab bar. Keep electrical cords out of the way. Do not use floor polish or wax that makes floors slippery. If you must use wax, use non-skid floor wax. Do not have throw rugs and other things on the floor that can make you trip. What can I do with my stairs? Do not leave any items on the stairs. Make sure that there are handrails on both sides of the stairs and use them. Fix handrails that are broken or loose. Make sure that handrails are as long as the stairways. Check any carpeting to make sure that it is firmly attached to the stairs. Fix any carpet that is loose or worn. Avoid having throw rugs at the top or bottom of the stairs. If you do have throw rugs, attach them to the floor with carpet tape. Make sure that you have a light switch at the top of the stairs and the bottom of the stairs. If you do not have them, ask someone  to add them for you. What else can I do to help prevent falls? Wear shoes that: Do not have high heels. Have rubber bottoms. Are comfortable and fit you well. Are closed at the toe. Do not wear sandals. If you use a stepladder: Make sure that it is fully opened. Do not climb a closed stepladder. Make sure that both sides of the stepladder are locked into place. Ask someone to hold it for you, if possible. Clearly mark and make sure that you can see: Any grab bars or handrails. First and last steps. Where the edge of each step is. Use tools that help you move around (mobility aids) if they are needed. These include: Canes. Walkers. Scooters. Crutches. Turn on the lights when you go into a dark area. Replace any light bulbs as soon as they burn out. Set up your furniture so you have a clear path. Avoid  moving your furniture around. If any of your floors are uneven, fix them. If there are any pets around you, be aware of where they are. Review your medicines with your doctor. Some medicines can make you feel dizzy. This can increase your chance of falling. Ask your doctor what other things that you can do to help prevent falls. This information is not intended to replace advice given to you by your health care provider. Make sure you discuss any questions you have with your health care provider. Document Released: 03/29/2009 Document Revised: 11/08/2015 Document Reviewed: 07/07/2014 Elsevier Interactive Patient Education  2017 [employer].

## 2023-10-21 ENCOUNTER — Ambulatory Visit (INDEPENDENT_AMBULATORY_CARE_PROVIDER_SITE_OTHER): Payer: Medicare Other | Admitting: Family Medicine

## 2023-10-21 ENCOUNTER — Encounter: Payer: Self-pay | Admitting: Family Medicine

## 2023-10-21 VITALS — BP 138/72 | HR 87 | Temp 98.5°F | Ht 71.0 in | Wt 222.2 lb

## 2023-10-21 DIAGNOSIS — Z0000 Encounter for general adult medical examination without abnormal findings: Secondary | ICD-10-CM | POA: Diagnosis not present

## 2023-10-21 DIAGNOSIS — N1832 Chronic kidney disease, stage 3b: Secondary | ICD-10-CM

## 2023-10-21 DIAGNOSIS — G4733 Obstructive sleep apnea (adult) (pediatric): Secondary | ICD-10-CM | POA: Diagnosis not present

## 2023-10-21 DIAGNOSIS — E785 Hyperlipidemia, unspecified: Secondary | ICD-10-CM

## 2023-10-21 DIAGNOSIS — Z7729 Contact with and (suspected ) exposure to other hazardous substances: Secondary | ICD-10-CM | POA: Insufficient documentation

## 2023-10-21 LAB — COMPREHENSIVE METABOLIC PANEL WITH GFR
ALT: 14 U/L (ref 0–53)
AST: 19 U/L (ref 0–37)
Albumin: 4.3 g/dL (ref 3.5–5.2)
Alkaline Phosphatase: 59 U/L (ref 39–117)
BUN: 23 mg/dL (ref 6–23)
CO2: 24 meq/L (ref 19–32)
Calcium: 9.5 mg/dL (ref 8.4–10.5)
Chloride: 105 meq/L (ref 96–112)
Creatinine, Ser: 1.51 mg/dL — ABNORMAL HIGH (ref 0.40–1.50)
GFR: 43.54 mL/min — ABNORMAL LOW (ref >60.00)
Glucose, Bld: 85 mg/dL (ref 70–99)
Potassium: 3.7 meq/L (ref 3.5–5.1)
Sodium: 138 meq/L (ref 135–145)
Total Bilirubin: 0.4 mg/dL (ref 0.2–1.2)
Total Protein: 7.3 g/dL (ref 6.0–8.3)

## 2023-10-21 LAB — LIPID PANEL
Cholesterol: 104 mg/dL (ref 0–200)
HDL: 37.7 mg/dL — ABNORMAL LOW (ref >39.00)
LDL Cholesterol: 46 mg/dL (ref 0–99)
NonHDL: 66.53
Total CHOL/HDL Ratio: 3
Triglycerides: 103 mg/dL (ref 0.0–149.0)
VLDL: 20.6 mg/dL (ref 0.0–40.0)

## 2023-10-21 NOTE — Patient Instructions (Addendum)
 I recommend initially calling the durable medical supply company that prescribes your CPAP machine first to ask about the mask.  If that is ineffective then you can call the sleep specialist at [phone].   No med changes were made today.  Keep follow-up with your specialist as planned.  Follow-up in 6 months and we can discuss any potential labs needed at that time as well as the repeat CT scan for your lungs.  Let me know if there are questions and take care!  Preventive Care 70 Years and Older, Male Preventive care refers to lifestyle choices and visits with your health care provider that can promote health and wellness. Preventive care visits are also called wellness exams. What can I expect for my preventive care visit? Counseling During your preventive care visit, your health care provider may ask about your: Medical history, including: Past medical problems. Family medical history. History of falls. Current health, including: Emotional well-being. Home life and relationship well-being. Sexual activity. Memory and ability to understand (cognition). Lifestyle, including: Alcohol, nicotine or tobacco, and drug use. Access to firearms. Diet, exercise, and sleep habits. Work and work Astronomer. Sunscreen use. Safety issues such as seatbelt and bike helmet use. Physical exam Your health care provider will check your: Height and weight. These may be used to calculate your BMI (body mass index). BMI is a measurement that tells if you are at a healthy weight. Waist circumference. This measures the distance around your waistline. This measurement also tells if you are at a healthy weight and may help predict your risk of certain diseases, such as type 2 diabetes and high blood pressure. Heart rate and blood pressure. Body temperature. Skin for abnormal spots. What immunizations do I need?  Vaccines are usually given at various ages, according to a schedule. Your health care  provider will recommend vaccines for you based on your age, medical history, and lifestyle or other factors, such as travel or where you work. What tests do I need? Screening Your health care provider may recommend screening tests for certain conditions. This may include: Lipid and cholesterol levels. Diabetes screening. This is done by checking your blood sugar (glucose) after you have not eaten for a while (fasting). Hepatitis C test. Hepatitis B test. HIV (human immunodeficiency virus) test. STI (sexually transmitted infection) testing, if you are at risk. Lung cancer screening. Colorectal cancer screening. Prostate cancer screening. Abdominal aortic aneurysm (AAA) screening. You may need this if you are a current or former smoker. Talk with your health care provider about your test results, treatment options, and if necessary, the need for more tests. Follow these instructions at home: Eating and drinking  Eat a diet that includes fresh fruits and vegetables, whole grains, lean protein, and low-fat dairy products. Limit your intake of foods with high amounts of sugar, saturated fats, and salt. Take vitamin and mineral supplements as recommended by your health care provider. Do not drink alcohol if your health care provider tells you not to drink. If you drink alcohol: Limit how much you have to 0-2 drinks a day. Know how much alcohol is in your drink. In the [place] one drink equals one 12 oz bottle of beer (355 mL), one 5 oz glass of wine (148 mL), or one 1 oz glass of hard liquor (44 mL). Lifestyle Brush your teeth every morning and night with fluoride toothpaste. Floss one time each day. Exercise for at least 30 minutes 5 or more days each week. Do not use any  products that contain nicotine or tobacco. These products include cigarettes, chewing tobacco, and vaping devices, such as e-cigarettes. If you need help quitting, ask your health care provider. Do not use drugs. If you are  sexually active, practice safe sex. Use a condom or other form of protection to prevent STIs. Take aspirin only as told by your health care provider. Make sure that you understand how much to take and what form to take. Work with your health care provider to find out whether it is safe and beneficial for you to take aspirin daily. Ask your health care provider if you need to take a cholesterol-lowering medicine (statin). Find healthy ways to manage stress, such as: Meditation, yoga, or listening to music. Journaling. Talking to a trusted person. Spending time with friends and family. Safety Always wear your seat belt while driving or riding in a vehicle. Do not drive: If you have been drinking alcohol. Do not ride with someone who has been drinking. When you are tired or distracted. While texting. If you have been using any mind-altering substances or drugs. Wear a helmet and other protective equipment during sports activities. If you have firearms in your house, make sure you follow all gun safety procedures. Minimize exposure to UV radiation to reduce your risk of skin cancer. What's next? Visit your health care provider once a year for an annual wellness visit. Ask your health care provider how often you should have your eyes and teeth checked. Stay up to date on all vaccines. This information is not intended to replace advice given to you by your health care provider. Make sure you discuss any questions you have with your health care provider. Document Revised: 11/28/2020 Document Reviewed: 11/28/2020 Elsevier Patient Education  2024 [employer].

## 2023-10-21 NOTE — Progress Notes (Signed)
 Subjective:  Patient ID: Carl Gutierrez, male    DOB: 08/22/1945  Age: 78 y.o. MRN: [id number]  CC:  Chief Complaint  Patient presents with   Annual Exam    Pt is doing well, notes he is fasting     HPI Carl Gutierrez presents for Annual Exam PCP, may Endocrinology, Dr. Aretha Kubas, last office visit April 4.  Hypopituitarism, diabetes, insulin -dependent.  Treated with levothyroxine , hydrocortisone , Humalog  75/25, and Xigduo .  A1c improved to 6.7%. Sleep specialist, Dr. Omar Bibber, severe obstructive sleep apnea.  Central sleep apnea.  CPAP titration in November. Still using CPAP nightly. Plan for full face mask - not ready yet.  Ophthalmology, Dr. Willeen Harold.  Visit in December, open angle with borderline findings. Cardiology, Dr. Addie Holstein, CAD, hyperlipidemia, hypertension, stage IIIa chronic kidney disease.  Appointment in October.  Continue on aspirin, Toprol , amlodipine , rosuvastatin  Nephrology, Appointment with Dr. Yvonnie Heritage in September.  CKD stage IIIb, protein creatinine ratio 528.  Creatinine of 1.53 with EGFR 46.  Secondary to microvascular disease for hypertension as well as diabetic nephropathy.  Status post cryoablation of the left renal mass in June 2023.  NSAID avoidance, 55-month follow-up planned.  If GFR drops below 30 would need to be off metformin , but would be okay to continue on SGLT2.  Also if GFR below 30 recommended no more than 10 mg daily Crestor . Appt this Friday.  No nsaids. Rare tylenol .  Pulmonary/lung cancer screening, CT scan in February, upper lobe nodule was stable, lymph nodes stable and other scar/lung changes also appears stable without new nodules, repeat CT scan in 1 year.  Sleep apnea as above. Denies depression or anhedonia.     10/20/2023    9:32 AM 04/15/2023    8:11 AM 10/13/2022    1:01 PM 10/02/2022   10:44 AM 07/29/2022    7:59 AM  Depression screen PHQ 2/9  Decreased Interest 0 0 0 0 0  Down, Depressed, Hopeless 0 0 0 0 0  PHQ - 2 Score 0 0 0 0 0  Altered  sleeping 3 0 0 1 1  Tired, decreased energy 1 1 0 0 1  Change in appetite 0 0 0 0 0  Feeling bad or failure about yourself  0 0 0 0 0  Trouble concentrating 0 0 0 0 0  Moving slowly or fidgety/restless 0 0 0 0 0  Suicidal thoughts 0 0 0 0 0  PHQ-9 Score 4 1 0 1 2  Difficult doing work/chores Not difficult at all  Not difficult at all Not difficult at all     Health Maintenance  Topic Date Due   Hepatitis C Screening  Never done   OPHTHALMOLOGY EXAM  07/24/2021   COVID-19 Vaccine (7 - Moderna risk 2024-25 season) 10/07/2023   INFLUENZA VACCINE  01/15/2024   HEMOGLOBIN A1C  03/15/2024   FOOT EXAM  06/01/2024   Diabetic kidney evaluation - eGFR measurement  09/13/2024   Diabetic kidney evaluation - Urine ACR  09/17/2024   Medicare Annual Wellness (AWV)  10/19/2024   Pneumonia Vaccine 48+ Years old  Completed   Zoster Vaccines- Shingrix  Completed   HPV VACCINES  Aged Out   Meningococcal B Vaccine  Aged Out   DTaP/Tdap/Td  Discontinued   Colonoscopy  Discontinued    Immunization History  Administered Date(s) Administered   Fluad Quad(high Dose 65+) 02/20/2019, 02/27/2020, 03/06/2022   Fluad Trivalent(High Dose 65+) 03/05/2018   Fluzone Influenza virus vaccine,trivalent (IIV3), split virus 02/14/2014   Influenza Split 03/05/2020  Influenza, High Dose Seasonal PF 05/11/2009, 03/17/2015, 04/03/2015, 03/16/2016, 04/02/2017   Influenza-Unspecified 03/16/2008, 05/11/2009, 04/16/2010, 02/15/2011, 04/04/2012, 03/16/2013, 04/05/2014, 04/03/2015, 04/22/2016, 03/07/2021   Moderna Covid-19 Fall Seasonal Vaccine 68yrs & older 03/26/2022   Moderna Sars-Covid-2 Vaccination 07/19/2019, 08/18/2019, 04/12/2020, 09/28/2020   PNEUMOCOCCAL CONJUGATE-20 05/26/2023   Pfizer(Comirnaty)Fall Seasonal Vaccine 12 years and older 04/08/2023   Pneumococcal Conjugate-13 01/22/2015, 06/22/2015   Pneumococcal Polysaccharide-23 06/16/2009   Pneumococcal-Unspecified 09/18/2009   Rsv, Bivalent, Protein Subunit  Rsvpref,pf Pattricia Bores) 02/10/2023   Td (Adult),5 Lf Tetanus Toxid, Preservative Free 02/10/2023   Tdap 06/16/2005, 02/10/2023   Zoster Recombinant(Shingrix) 02/20/2022, 05/01/2022  Covid booster - last fall. Repeat recommended.   Hep C screening - declined.   No results found. Followed by optho with visit in December - at [place]. No recent vision changes.   Dental: natural teeth -  no recnt dental visit. Recommended dental visit - he wills schedule.   Alcohol: none  Tobacco: none   Exercise: some activity at home, mowing, yardwork. Some limitations with left`knee.    History Patient Active Problem List   Diagnosis Date Noted   Exposure to potentially hazardous substance 10/21/2023   Age-related cataract 04/15/2023   Age-related nuclear cataract, bilateral 04/15/2023   Hearing loss 04/15/2023   Open angle with borderline findings, low risk, bilateral 04/15/2023   Pain in joint, lower leg 04/15/2023   Coronary artery disease 04/15/2023   Encounter for other administrative examinations 04/15/2023   Encounter for immunization 04/15/2023   Hypopituitarism (HCC) 04/15/2023   Left knee pain 04/03/2023   OSA (obstructive sleep apnea) 04/01/2023   Central sleep apnea 04/01/2023   Left renal mass 12/04/2021   Chest pain with low risk for cardiac etiology 09/08/2021   Gout of big toe 10/31/2020   Stage 3a chronic kidney disease (HCC) 01/04/2020   Anemia in chronic kidney disease 11/30/2015   Recurrent deep vein thrombosis (DVT) (HCC) 10/01/2015   Hematuria, unspecified 10/01/2015   Elevated serum creatinine 10/01/2015   BPH (benign prostatic hyperplasia) 08/13/2015   Benign localized hyperplasia of prostate with urinary obstruction 07/23/2015   Preop cardiovascular exam 07/17/2015   Encounter for CDL (commercial driving license) exam 10/62/6948   Obesity (BMI 30-39.9) 06/26/2013   Essential hypertension 06/26/2013   Panhypopituitarism (HCC) 11/17/2011     11/17/2011   Diabetes  mellitus (HCC) 11/17/2011   Hyperlipidemia associated with type 2 diabetes mellitus (HCC) 11/17/2011   Coronary artery disease involving native heart without angina pectoris 07/19/2002   Past Medical History:  Diagnosis Date   CAD S/P percutaneous coronary angioplasty February 2004; 2008   PCI-dLAD: 2.25 mm x 16 mm Express BMS; Ramus- 2.75 mm x 18 mm Cypher DES (2.8 mm); follow-up 11/'08: Patent LAD/ramus stents. 90% small OM. EF 50-60%.; Myoview  1/'12: Exercise 9 min, 10 METS with no ischemia/infarction.    Clotting disorder (HCC)    Diabetes mellitus    Low-dose metformin    DJD (degenerative joint disease), lumbosacral     status post L4-L5 surgery   Essential hypertension    History of DVT of lower extremity     postoperative   Hyperlipidemia with target LDL less than 70    Hypothyroidism    Synthroid    Panhypopituitarism (HCC)    Status post pituitary adenoma removal in 2012   Recurrent deep vein thrombosis (DVT) (HCC) 10/01/2015   Past Surgical History:  Procedure Laterality Date   ARTHROSCOPY KNEE W/ DRILLING Left    BRAIN SURGERY  06/16/2010   Removal of pituitary adenoma  CARDIAC CATHETERIZATION  04/17/2007   Significant LAD tapering but no significant disease the distal stent. Patent Ramus stent. 90% lesion in small OM 2; small nondominant RCA. Medical therapy. EF 50-60%.   CORONARY ANGIOPLASTY WITH STENT PLACEMENT  07/17/2002   PCI-dLAD: 2.25 mm x 16 mm BMS;;PCI- RI: 2.75 mm 18 mm Cypher DES   IR RADIOLOGIST EVAL & MGMT  11/04/2021   IR RADIOLOGIST EVAL & MGMT  12/31/2021   IR RADIOLOGIST EVAL & MGMT  02/27/2022   IR RADIOLOGIST EVAL & MGMT  03/09/2023   NM MYOVIEW  LTD  06/16/2010   a) Exercise ~9 min - 10 METS; no ischemia or infarction; b) 06/2021: LOW RISK.  Normal LV size and function.  EF 55 to 60%.  No RWMA.  No ischemia or infarct.  Fixed inferior basal apical defect with normal motion suggestive of artifact.  (Diaphragmatic attenuation)   PROSTATECTOMY N/A  08/13/2015   Procedure: TRANSVESICLE OPEN PROSTATECTOMY** ;  Surgeon: Annamarie Kid, MD;  Location: [facility];  Service: Urology;  Laterality: N/A;   RADIOLOGY WITH ANESTHESIA N/A 12/04/2021   Procedure: LEFT RENAL CRYO ABLATION;  Surgeon: Erica Hau, MD;  Location: [facility];  Service: Radiology;  Laterality: N/A;   SPINE SURGERY     L4-L5   Allergies  Allergen Reactions   Hydrochlorothiazide      Leg and side pain with this   Other     Blood pressure med name unknown.   Prior to Admission medications   Medication Sig Start Date End Date Taking? Authorizing Provider  Alcohol Swabs (PHARMACIST CHOICE ALCOHOL) PADS SMARTSIG:1 Each Topical 4 Times Daily 07/17/21  Yes [provider]  amLODipine  (NORVASC ) 10 MG tablet TAKE 1 TABLET BY MOUTH EVERY DAY 10/20/23  Yes Benjiman Bras, MD  aspirin 81 MG tablet Take 81 mg by mouth daily. Reported on 09/05/2015   Yes [provider]  B-D UF III MINI PEN NEEDLES 31G X 5 MM MISC USE FOR INSULIN  PEN TWICE A DAY 04/29/21  Yes Lajean Pike, MD  Blood Glucose Calibration (ACCU-CHEK GUIDE CONTROL) LIQD  07/06/23  Yes [provider]  Blood Glucose Monitoring Suppl DEVI 1 each by Does not apply route in the morning, at noon, and at bedtime. May substitute to any manufacturer covered by patient's insurance. 06/02/23  Yes Thapa, [place], MD  Dapagliflozin  Pro-metFORMIN  ER (XIGDUO  XR) 10-998 MG TB24 Take 1 tablet by mouth daily. 06/02/23  Yes Thapa, [place], MD  glucose blood (ONETOUCH VERIO) test strip USE AS INSTRUCTED TO CHECK BLOOD SUGAR 2x  DAILY. 06/02/23  Yes Thapa, [place], MD  hydrocortisone  (CORTEF ) 20 MG tablet Take 1 tablet in the morning and half tablet at 5 PM 06/02/23  Yes Thapa, [place], MD  Insulin  Lispro Prot & Lispro (HUMALOG  75/25 MIX) (75-25) 100 UNIT/ML Kwikpen INJECT 20 UNITS UNDER THE SKIN BEFORE BREAKFAST AND 6 UNITS BEFORE SUPPER. (REPLACES 70/30) 06/02/23  Yes Thapa, [place], MD  Lancet Devices (SIMPLE DIAGNOSTICS  LANCING DEV) MISC Apply topically. 07/06/23  Yes [provider]  levothyroxine  (SYNTHROID ) 88 MCG tablet Take 1 tablet (88 mcg total) by mouth daily. 06/02/23  Yes Thapa, [place], MD  metoprolol  succinate (TOPROL -XL) 25 MG 24 hr tablet TAKE 1 TABLET (25 MG TOTAL) BY MOUTH DAILY. 10/06/23  Yes Benjiman Bras, MD  Pharmacist Choice Lancets MISC  07/06/23  Yes [provider]  rosuvastatin  (CRESTOR ) 40 MG tablet TAKE 1 TABLET BY MOUTH EVERY DAY 08/26/23  Yes Benjiman Bras, MD  nitroGLYCERIN  (NITROSTAT )  0.4 MG SL tablet Place 1 tablet (0.4 mg total) under the tongue every 5 (five) minutes as needed for chest pain. 07/01/21 08/07/22  [place], Peter M, MD   Social History   Socioeconomic History   Marital status: Married    Spouse name: Not on file   Number of children: Not on file   Years of education: Not on file   Highest education level: Not on file  Occupational History   Not on file  Tobacco Use   Smoking status: Former    Current packs/day: 0.00    Types: Cigarettes    Quit date: 06/24/1993    Years since quitting: 30.3   Smokeless tobacco: Never  Vaping Use   Vaping status: Never Used  Substance and Sexual Activity   Alcohol use: No   Drug use: No   Sexual activity: Not Currently    Comment: Local truck driver, 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 Exercises only occasionally. Not as much as he desires 2. Works as a Arts administrator.   He does not smoke cigarettes -- quit in 1995. Does not drink alcohol.   Social Drivers of Corporate investment banker Strain: Low Risk  (10/20/2023)   Overall Financial Resource Strain (CARDIA)    Difficulty of Paying Living Expenses: Not hard at all  Food Insecurity: No Food Insecurity (10/20/2023)   Hunger Vital Sign    Worried About Running Out of Food in the Last Year: Never true    Ran Out of Food in the Last  Year: Never true  Transportation Needs: No Transportation Needs (10/20/2023)   PRAPARE - Administrator, Civil Service (Medical): No    Lack of Transportation (Non-Medical): No  Physical Activity: Inactive (10/20/2023)   Exercise Vital Sign    Days of Exercise per Week: 0 days    Minutes of Exercise per Session: 0 min  Stress: No Stress Concern Present (10/20/2023)   [employer] of Occupational Health - Occupational Stress Questionnaire    Feeling of Stress : Not at all  Social Connections: Unknown (10/20/2023)   Social Connection and Isolation Panel [NHANES]    Frequency of Communication with Friends and Family: More than three times a week    Frequency of Social Gatherings with Friends and Family: More than three times a week    Attends Religious Services: More than 4 times per year    Active Member of [employer] or Organizations: Not on file    Attends Banker Meetings: Never    Marital Status: Not on file  Intimate Partner Violence: Not At Risk (10/20/2023)   Humiliation, Afraid, Rape, and Kick questionnaire    Fear of Current or Ex-Partner: No    Emotionally Abused: No    Physically Abused: No    Sexually Abused: No    Review of Systems 13 point review of systems per patient health survey noted.  Negative other than as indicated above or in HPI.    Objective:   Vitals:   10/21/23 0903  BP: 138/72  Pulse: 87  Temp: 98.5 F (36.9 C)  TempSrc: Temporal  SpO2: 99%  Weight: 222 lb 3.2 oz (100.8 kg)  Height: 5\' 11"  (1.803 m)     Physical Exam Vitals reviewed.  Constitutional:      Appearance: He is well-developed.  HENT:     Head: Normocephalic and atraumatic.     Right Ear: External ear normal.     Left Ear: External ear normal.  Eyes:     Conjunctiva/sclera: Conjunctivae normal.     Pupils: Pupils are equal, round, and reactive to light.  Neck:     Thyroid : No thyromegaly.  Cardiovascular:     Rate and Rhythm: Normal rate and regular  rhythm.     Heart sounds: Normal heart sounds.  Pulmonary:     Effort: Pulmonary effort is normal. No respiratory distress.     Breath sounds: Normal breath sounds. No wheezing.  Abdominal:     General: There is no distension.     Palpations: Abdomen is soft.     Tenderness: There is no abdominal tenderness.  Musculoskeletal:        General: No tenderness. Normal range of motion.     Cervical back: Normal range of motion and neck supple.  Lymphadenopathy:     Cervical: No cervical adenopathy.  Skin:    General: Skin is warm and dry.  Neurological:     Mental Status: He is alert and oriented to person, place, and time.     Deep Tendon Reflexes: Reflexes are normal and symmetric.  Psychiatric:        Behavior: Behavior normal.     Assessment & Plan:  Carl Gutierrez is a 78 y.o. male . Annual physical exam  - -anticipatory guidance as below in AVS, screening labs above. Health maintenance items as above in HPI discussed/recommended as applicable.  Please follow-up with specialist as planned.  OSA (obstructive sleep apnea)  - Tolerating CPAP, questions discussed regarding new mask/fullface mask, and recommended he contact his durable medical equipment company first, and then his sleep specialist if needed, phone number provided.  CKD stage 3b, GFR 30-44 ml/min (HCC)  - Keep follow-up with nephrology later this week as planned.  Noted recommendations of monitoring GFR and stopping metformin , lower dose of Crestor  if GFR less than 30.  Hyperlipidemia, unspecified hyperlipidemia type - Plan: Comprehensive metabolic panel with GFR, Lipid panel  - Tolerating current regimen, check updated labs.   No orders of the defined types were placed in this encounter.  Patient Instructions  I recommend initially calling the durable medical supply company that prescribes your CPAP machine first to ask about the mask.  If that is ineffective then you can call the sleep specialist at [phone].    No med changes were made today.  Keep follow-up with your specialist as planned.  Follow-up in 6 months and we can discuss any potential labs needed at that time as well as the repeat CT scan for your lungs.  Let me know if there are questions and take care!  Preventive Care 62 Years and Older, Male Preventive care refers to lifestyle choices and visits with your health care provider that can promote health and wellness. Preventive care visits are also called wellness exams. What can I expect for my preventive care visit? Counseling During your preventive care visit, your health care provider may ask about your: Medical history, including: Past medical problems. Family medical history. History of falls. Current health, including: Emotional well-being. Home life and relationship well-being. Sexual activity. Memory and ability to understand (cognition). Lifestyle, including: Alcohol, nicotine or tobacco, and drug use. Access to firearms. Diet, exercise, and sleep habits. Work and work Astronomer. Sunscreen use. Safety issues such as seatbelt and bike helmet use. Physical exam Your health care provider  will check your: Height and weight. These may be used to calculate your BMI (body mass index). BMI is a measurement that tells if you are at a healthy weight. Waist circumference. This measures the distance around your waistline. This measurement also tells if you are at a healthy weight and may help predict your risk of certain diseases, such as type 2 diabetes and high blood pressure. Heart rate and blood pressure. Body temperature. Skin for abnormal spots. What immunizations do I need?  Vaccines are usually given at various ages, according to a schedule. Your health care provider will recommend vaccines for you based on your age, medical history, and lifestyle or other factors, such as travel or where you work. What tests do I need? Screening Your health care provider may  recommend screening tests for certain conditions. This may include: Lipid and cholesterol levels. Diabetes screening. This is done by checking your blood sugar (glucose) after you have not eaten for a while (fasting). Hepatitis C test. Hepatitis B test. HIV (human immunodeficiency virus) test. STI (sexually transmitted infection) testing, if you are at risk. Lung cancer screening. Colorectal cancer screening. Prostate cancer screening. Abdominal aortic aneurysm (AAA) screening. You may need this if you are a current or former smoker. Talk with your health care provider about your test results, treatment options, and if necessary, the need for more tests. Follow these instructions at home: Eating and drinking  Eat a diet that includes fresh fruits and vegetables, whole grains, lean protein, and low-fat dairy products. Limit your intake of foods with high amounts of sugar, saturated fats, and salt. Take vitamin and mineral supplements as recommended by your health care provider. Do not drink alcohol if your health care provider tells you not to drink. If you drink alcohol: Limit how much you have to 0-2 drinks a day. Know how much alcohol is in your drink. In the [place] one drink equals one 12 oz bottle of beer (355 mL), one 5 oz glass of wine (148 mL), or one 1 oz glass of hard liquor (44 mL). Lifestyle Brush your teeth every morning and night with fluoride toothpaste. Floss one time each day. Exercise for at least 30 minutes 5 or more days each week. Do not use any products that contain nicotine or tobacco. These products include cigarettes, chewing tobacco, and vaping devices, such as e-cigarettes. If you need help quitting, ask your health care provider. Do not use drugs. If you are sexually active, practice safe sex. Use a condom or other form of protection to prevent STIs. Take aspirin only as told by your health care provider. Make sure that you understand how much to take and what  form to take. Work with your health care provider to find out whether it is safe and beneficial for you to take aspirin daily. Ask your health care provider if you need to take a cholesterol-lowering medicine (statin). Find healthy ways to manage stress, such as: Meditation, yoga, or listening to music. Journaling. Talking to a trusted person. Spending time with friends and family. Safety Always wear your seat belt while driving or riding in a vehicle. Do not drive: If you have been drinking alcohol. Do not ride with someone who has been drinking. When you are tired or distracted. While texting. If you have been using any mind-altering substances or drugs. Wear a helmet and other protective equipment during sports activities. If you have firearms in your house, make sure you follow all gun safety procedures. Minimize exposure  to UV radiation to reduce your risk of skin cancer. What's next? Visit your health care provider once a year for an annual wellness visit. Ask your health care provider how often you should have your eyes and teeth checked. Stay up to date on all vaccines. This information is not intended to replace advice given to you by your health care provider. Make sure you discuss any questions you have with your health care provider. Document Revised: 11/28/2020 Document Reviewed: 11/28/2020 Elsevier Patient Education  2024 Elsevier Inc.     Signed,   Caro Christmas, MD  Primary Care, [facility] 10/21/23 9:47 AM

## 2023-10-23 DIAGNOSIS — N2889 Other specified disorders of kidney and ureter: Secondary | ICD-10-CM | POA: Diagnosis not present

## 2023-10-23 DIAGNOSIS — E1122 Type 2 diabetes mellitus with diabetic chronic kidney disease: Secondary | ICD-10-CM | POA: Diagnosis not present

## 2023-10-23 DIAGNOSIS — N1832 Chronic kidney disease, stage 3b: Secondary | ICD-10-CM | POA: Diagnosis not present

## 2023-10-23 DIAGNOSIS — I129 Hypertensive chronic kidney disease with stage 1 through stage 4 chronic kidney disease, or unspecified chronic kidney disease: Secondary | ICD-10-CM | POA: Diagnosis not present

## 2023-10-23 DIAGNOSIS — R809 Proteinuria, unspecified: Secondary | ICD-10-CM | POA: Diagnosis not present

## 2023-10-23 DIAGNOSIS — D631 Anemia in chronic kidney disease: Secondary | ICD-10-CM | POA: Diagnosis not present

## 2023-10-27 ENCOUNTER — Ambulatory Visit: Payer: Self-pay | Admitting: Family Medicine

## 2023-11-02 ENCOUNTER — Ambulatory Visit: Payer: Self-pay | Admitting: Family Medicine

## 2023-12-21 ENCOUNTER — Other Ambulatory Visit: Payer: Self-pay | Admitting: Endocrinology

## 2023-12-21 DIAGNOSIS — Z794 Long term (current) use of insulin: Secondary | ICD-10-CM

## 2023-12-21 NOTE — Telephone Encounter (Signed)
 Refill request complete

## 2024-01-26 ENCOUNTER — Ambulatory Visit: Payer: Self-pay | Admitting: Endocrinology

## 2024-01-26 ENCOUNTER — Encounter: Payer: Self-pay | Admitting: Endocrinology

## 2024-01-26 ENCOUNTER — Ambulatory Visit: Admitting: Endocrinology

## 2024-01-26 VITALS — BP 132/60 | HR 55 | Resp 20 | Ht 71.0 in | Wt 221.2 lb

## 2024-01-26 DIAGNOSIS — Z794 Long term (current) use of insulin: Secondary | ICD-10-CM

## 2024-01-26 DIAGNOSIS — E2749 Other adrenocortical insufficiency: Secondary | ICD-10-CM

## 2024-01-26 DIAGNOSIS — E038 Other specified hypothyroidism: Secondary | ICD-10-CM

## 2024-01-26 DIAGNOSIS — E230 Hypopituitarism: Secondary | ICD-10-CM

## 2024-01-26 DIAGNOSIS — E118 Type 2 diabetes mellitus with unspecified complications: Secondary | ICD-10-CM | POA: Diagnosis not present

## 2024-01-26 LAB — POCT GLYCOSYLATED HEMOGLOBIN (HGB A1C): Hemoglobin A1C: 6.5 % — AB (ref 4.0–5.6)

## 2024-01-26 MED ORDER — BLOOD GLUCOSE MONITORING SUPPL DEVI: 1.0000 | Freq: Three times a day (TID) | 0 refills | Status: AC

## 2024-01-26 MED ORDER — LANCETS MISC. MISC: 1.0000 | Freq: Three times a day (TID) | 3 refills | Status: AC

## 2024-01-26 MED ORDER — LANCET DEVICE MISC: 1.0000 | Freq: Three times a day (TID) | 0 refills | Status: AC

## 2024-01-26 MED ORDER — BLOOD GLUCOSE TEST VI STRP: 1.0000 | ORAL_STRIP | Freq: Three times a day (TID) | 3 refills | Status: DC

## 2024-01-26 NOTE — Progress Notes (Signed)
 Outpatient Endocrinology Note Carl Sheryle Vice, MD  01/26/24  Patient's Name: Carl Gutierrez    DOB: 05/17/1946    MRN: [id number]                                                    REASON OF VISIT: Follow up for type 2 diabetes mellitus / hypopituitarism  PCP: Levora Reyes SAUNDERS, MD  HISTORY OF PRESENT ILLNESS:   Carl Gutierrez is a 78 y.o. old male with past medical history listed below, is here for follow up for type 2 diabetes mellitus and hypopituitarism / secondary hypothyroidism / secondary adrenal insufficiency.  Pertinent Diabetes History: Patient was previously seen by Dr. Von and was last time seen in August 2024.  Chronic Diabetes Complications : Retinopathy: no ?. Last ophthalmology exam was done on 6-12 months, 05/2023, following with ophthalmology regularly.  Nephropathy: CKD III b, following with nephrology. Peripheral neuropathy: no Coronary artery disease: yes Stroke: no  Relevant comorbidities and cardiovascular risk factors: Obesity: yes Body mass index is 30.85 kg/m.  Hypertension: Yes  Hyperlipidemia : Yes, on statin   Current / Home Diabetic regimen includes:    INSULIN  regimen: Humalog  75/25 insulin , 20 units before breakfast and 6 before dinner   Non-insulin  hypoglycemic drugs: Xigduo  10/998 mg daily  Prior diabetic medications:  Glycemic data:   Glucometer One Touch Verio reflect download from July 29 to January 26, 2024, average blood sugar 134.  He has been checking blood sugar occasionally.  Some of the blood sugar 117, 164, 122.  SABRAHe denied CGM in the past.  Hypoglycemia: Patient has no hypoglycemic episodes. Patient has hypoglycemia awareness.  Factors modifying glucose control: 1.  Diabetic diet assessment: 3 meals a day.  2.  Staying active or exercising: Little walking.  Exercise limited due to knee pain.  3.  Medication compliance: compliant all of the time.  # Hypopituitarism : -History of pituitary surgery for pituitary adenoma in  2012.  He has secondary hypothyroidism and secondary adrenal insufficiency, taking levothyroxine  88  MCG daily and hydrocortisone  20 mg in the morning and 10 mg in the afternoon replacement daily. -He has hypogonadotropic hypogonadism, used to be on testosterone  supplement in the past in the form of AndroGel  and injectable testosterone , currently not on testosterone  supplement, patient does not want to try any supplement for previous note.  Interval history  Glucometer data as reviewed above.  Diabetes regimen as reviewed and noted above.  Hemoglobin A1c 6.5% today.  He denies hypoglycemic symptoms.  He has been taking levothyroxine  and hydrocortisone  as noted above.  Overall feeling good.  No new complaints today.    REVIEW OF SYSTEMS As per history of present illness.   PAST MEDICAL HISTORY: Past Medical History:  Diagnosis Date   CAD S/P percutaneous coronary angioplasty February 2004; 2008   PCI-dLAD: 2.25 mm x 16 mm Express BMS; Ramus- 2.75 mm x 18 mm Cypher DES (2.8 mm); follow-up 11/'08: Patent LAD/ramus stents. 90% small OM. EF 50-60%.; Myoview  1/'12: Exercise 9 min, 10 METS with no ischemia/infarction.    Clotting disorder (HCC)    Diabetes mellitus    Low-dose metformin    DJD (degenerative joint disease), lumbosacral     status post L4-L5 surgery   Essential hypertension    History of DVT of lower extremity     postoperative  Hyperlipidemia with target LDL less than 70    Hypothyroidism    Synthroid   Panhypopituitarism (HCC)    Status post pituitary adenoma removal in 2012   Recurrent deep vein thrombosis (DVT) (HCC) 10/01/2015    PAST SURGICAL HISTORY: Past Surgical History:  Procedure Laterality Date   ARTHROSCOPY KNEE W/ DRILLING Left    BRAIN SURGERY  06/16/2010   Removal of pituitary adenoma   CARDIAC CATHETERIZATION  04/17/2007   Significant LAD tapering but no significant disease the distal stent. Patent Ramus stent. 90% lesion in small OM 2; small nondominant  RCA. Medical therapy. EF 50-60%.   CORONARY ANGIOPLASTY WITH STENT PLACEMENT  07/17/2002   PCI-dLAD: 2.25 mm x 16 mm BMS;;PCI- RI: 2.75 mm 18 mm Cypher DES   IR RADIOLOGIST EVAL & MGMT  11/04/2021   IR RADIOLOGIST EVAL & MGMT  12/31/2021   IR RADIOLOGIST EVAL & MGMT  02/27/2022   IR RADIOLOGIST EVAL & MGMT  03/09/2023   NM MYOVIEW LTD  06/16/2010   a) Exercise ~9 min - 10 METS; no ischemia or infarction; b) 06/2021: LOW RISK.  Normal LV size and function.  EF 55 to 60%.  No RWMA.  No ischemia or infarct.  Fixed inferior basal apical defect with normal motion suggestive of artifact.  (Diaphragmatic attenuation)   PROSTATECTOMY N/A 08/13/2015   Procedure: TRANSVESICLE OPEN PROSTATECTOMY** ;  Surgeon: Arlena Gal, MD;  Location: [facility];  Service: Urology;  Laterality: N/A;   RADIOLOGY WITH ANESTHESIA N/A 12/04/2021   Procedure: LEFT RENAL CRYO ABLATION;  Surgeon: Luverne Aran, MD;  Location: [facility];  Service: Radiology;  Laterality: N/A;   SPINE SURGERY     L4-L5    ALLERGIES: Allergies  Allergen Reactions   Hydrochlorothiazide     Leg and side pain with this   Other     Blood pressure med name unknown.    FAMILY HISTORY:  Family History  Problem Relation Age of Onset   Cancer Mother        pancreatic ca   Sleep apnea Neg Hx     SOCIAL HISTORY: Social History   Socioeconomic History   Marital status: Married    Spouse name: Not on file   Number of children: Not on file   Years of education: Not on file   Highest education level: Not on file  Occupational History   Not on file  Tobacco Use   Smoking status: Former    Current packs/day: 0.00    Types: Cigarettes    Quit date: 06/24/1993    Years since quitting: 30.6   Smokeless tobacco: Never  Vaping Use   Vaping status: Never Used  Substance and Sexual Activity   Alcohol use: No   Drug use: No   Sexual activity: Not Currently    Comment: Local truck driver, 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 Exercises only occasionally. Not as much as he desires 2. Works as a Arts administrator.   He does not smoke cigarettes -- quit in 1995. Does not drink alcohol.   Social Drivers of Corporate investment banker Strain: Low Risk  (10/20/2023)   Overall Financial Resource Strain (CARDIA)    Difficulty of Paying Living Expenses: Not hard at all  Food Insecurity: No Food Insecurity (10/20/2023)   Hunger Vital Sign  Worried About Programme researcher, broadcasting/film/video in the Last Year: Never true    Ran Out of Food in the Last Year: Never true  Transportation Needs: No Transportation Needs (10/20/2023)   PRAPARE - Administrator, Civil Service (Medical): No    Lack of Transportation (Non-Medical): No  Physical Activity: Inactive (10/20/2023)   Exercise Vital Sign    Days of Exercise per Week: 0 days    Minutes of Exercise per Session: 0 min  Stress: No Stress Concern Present (10/20/2023)   [employer] of Occupational Health - Occupational Stress Questionnaire    Feeling of Stress : Not at all  Social Connections: Unknown (10/20/2023)   Social Connection and Isolation Panel    Frequency of Communication with Friends and Family: More than three times a week    Frequency of Social Gatherings with Friends and Family: More than three times a week    Attends Religious Services: More than 4 times per year    Active Member of [employer] or Organizations: Not on file    Attends Banker Meetings: Never    Marital Status: Not on file    MEDICATIONS:  Current Outpatient Medications  Medication Sig Dispense Refill   Alcohol Swabs (PHARMACIST CHOICE ALCOHOL) PADS SMARTSIG:1 Each Topical 4 Times Daily     amLODipine  (NORVASC ) 10 MG tablet TAKE 1 TABLET BY MOUTH EVERY DAY 90 tablet 1   aspirin 81 MG tablet Take 81 mg by mouth daily. Reported on 09/05/2015     B-D UF III MINI PEN NEEDLES 31G X 5 MM MISC  USE FOR INSULIN  PEN TWICE A DAY 100 each 7   Blood Glucose Calibration (ACCU-CHEK GUIDE CONTROL) LIQD      Blood Glucose Monitoring Suppl DEVI 1 each by Does not apply route in the morning, at noon, and at bedtime. May substitute to any manufacturer covered by patient's insurance. 1 each 0   Blood Glucose Monitoring Suppl DEVI 1 each by Does not apply route in the morning, at noon, and at bedtime. May substitute to any manufacturer covered by patient's insurance. 1 each 0   Dapagliflozin  Pro-metFORMIN  ER (XIGDUO  XR) 10-998 MG TB24 Take 1 tablet by mouth daily. 90 tablet 3   Glucose Blood (BLOOD GLUCOSE TEST STRIPS) STRP 1 each by In Vitro route in the morning, at noon, and at bedtime. May substitute to any manufacturer covered by patient's insurance. 100 each 3   glucose blood (ONETOUCH VERIO) test strip USE AS INSTRUCTED TO CHECK BLOOD SUGAR 2x  DAILY. 300 strip 3   hydrocortisone  (CORTEF ) 20 MG tablet Take 1 tablet in the morning and half tablet at 5 PM 135 tablet 3   Insulin  Lispro Prot & Lispro (HUMALOG  75/25 MIX) (75-25) 100 UNIT/ML Kwikpen INJECT 20 UNITS UNDER THE SKIN BEFORE BREAKFAST AND 6 UNITS BEFORE SUPPER. (REPLACES 70/30) 15 mL 2   Lancet Device MISC 1 each by Does not apply route in the morning, at noon, and at bedtime. May substitute to any manufacturer covered by patient's insurance. 1 each 0   Lancet Devices (SIMPLE DIAGNOSTICS LANCING DEV) MISC Apply topically.     Lancets Misc. MISC 1 each by Does not apply route in the morning, at noon, and at bedtime. May substitute to any manufacturer covered by patient's insurance. 100 each 3   levothyroxine  (SYNTHROID ) 88 MCG tablet Take 1 tablet (88 mcg total) by mouth daily. 90 tablet 3   metoprolol  succinate (TOPROL -XL) 25 MG 24 hr tablet TAKE  1 TABLET (25 MG TOTAL) BY MOUTH DAILY. 90 tablet 1   nitroGLYCERIN  (NITROSTAT ) 0.4 MG SL tablet Place 1 tablet (0.4 mg total) under the tongue every 5 (five) minutes as needed for chest pain. 90 tablet  3   Pharmacist Choice Lancets MISC      rosuvastatin  (CRESTOR ) 40 MG tablet TAKE 1 TABLET BY MOUTH EVERY DAY 90 tablet 1   No current facility-administered medications for this visit.    PHYSICAL EXAM: Vitals:   01/26/24 0812  BP: 132/60  Pulse: (!) 55  Resp: 20  SpO2: 97%  Weight: 221 lb 3.2 oz (100.3 kg)  Height: 5' 11 (1.803 m)    Body mass index is 30.85 kg/m.  Wt Readings from Last 3 Encounters:  01/26/24 221 lb 3.2 oz (100.3 kg)  10/21/23 222 lb 3.2 oz (100.8 kg)  09/18/23 221 lb 3.2 oz (100.3 kg)    General: Well developed, well nourished male in no apparent distress.  HEENT: AT/[place], no external lesions.  Eyes: Conjunctiva clear and no icterus. Neck: Neck supple  Lungs: Respirations not labored Neurologic: Alert, oriented, normal speech Extremities / Skin: Dry. Psychiatric: Does not appear depressed or anxious  Diabetic Foot Exam - Simple   No data filed    LABS Reviewed Lab Results  Component Value Date   HGBA1C 6.5 (A) 01/26/2024   HGBA1C 6.7 (H) 09/14/2023   HGBA1C 7.0 (H) 05/26/2023   Lab Results  Component Value Date   FRUCTOSAMINE 253 10/24/2021   FRUCTOSAMINE 231 10/03/2020   FRUCTOSAMINE 254 11/29/2018   Lab Results  Component Value Date   CHOL 104 10/21/2023   HDL 37.70 (L) 10/21/2023   LDLCALC 46 10/21/2023   LDLDIRECT 85.0 05/18/2014   TRIG 103.0 10/21/2023   CHOLHDL 3 10/21/2023   Lab Results  Component Value Date   MICRALBCREAT 92 (H) 09/18/2023   MICRALBCREAT 4.3 02/14/2014   Lab Results  Component Value Date   CREATININE 1.51 (H) 10/21/2023   Lab Results  Component Value Date   GFR 43.54 (L) 10/21/2023    ASSESSMENT / PLAN  1. Controlled type 2 diabetes mellitus with complication, with long-term current use of insulin  (HCC)   2. Secondary adrenal insufficiency (HCC)   3. Panhypopituitarism (HCC)   4. Secondary hypothyroidism   5. Hypogonadotropic hypogonadism (HCC)      Diabetes Mellitus type 2, complicated by  CKD. - Diabetic status / severity: Controlled.  Lab Results  Component Value Date   HGBA1C 6.5 (A) 01/26/2024    - Hemoglobin A1c goal : <6.5%  Denies hypoglycemic symptoms.  Overall reasonable control of diabetes mellitus.  - Medications: No change.  INSULIN  regimen: Humalog  75/25 insulin , 20 units before breakfast and 6 before dinner   Non-insulin  hypoglycemic drugs: Xigduo  10/998 mg daily  - Home glucose testing: At least 2 times a day in the morning fasting and at bedtime.  Sent new prescription for glucometer and test supplies. - Discussed/ Gave Hypoglycemia treatment plan.  # Consult : not required at this time.   # Annual urine for microalbuminuria/ creatinine ratio, + microalbuminuria currently.  He has CKD following with nephrology.   Last  Lab Results  Component Value Date   MICRALBCREAT 92 (H) 09/18/2023    # Foot check nightly / neuropathy.  # Annual dilated diabetic eye exams.   - Diet: Make healthy diabetic food choices - Life style / activity / exercise: Discussed.  2. Blood pressure  -  BP Readings from Last 1 Encounters:  01/26/24 132/60    - Control is in target.  - No change in current plans.  3. Lipid status / Hyperlipidemia - Last  Lab Results  Component Value Date   LDLCALC 46 10/21/2023   - Continue rosuvastatin  20 mg daily.  # Panhypopituitarism -History of pituitary adenoma status post surgery in 2012. -Secondary adrenal insufficiency : Continue hydrocortisone  20 mg in the morning and 10 mg in the afternoon. -Secondary hypothyroidism : Continue current dose of levothyroxine  88 mcg daily.  Monitor free T4, not TSH, annually. -Hypogonadism: He has has not reported symptoms despite very low testosterone  level in the past and does not want to try any supplements.  Diagnoses and all orders for this visit:  Controlled type 2 diabetes mellitus with complication, with long-term current use of insulin  (HCC) -     POCT glycosylated  hemoglobin (Hb A1C) -     Blood Glucose Monitoring Suppl DEVI; 1 each by Does not apply route in the morning, at noon, and at bedtime. May substitute to any manufacturer covered by patient's insurance. -     Glucose Blood (BLOOD GLUCOSE TEST STRIPS) STRP; 1 each by In Vitro route in the morning, at noon, and at bedtime. May substitute to any manufacturer covered by patient's insurance. -     Lancet Device MISC; 1 each by Does not apply route in the morning, at noon, and at bedtime. May substitute to any manufacturer covered by patient's insurance. -     Lancets Misc. MISC; 1 each by Does not apply route in the morning, at noon, and at bedtime. May substitute to any manufacturer covered by patient's insurance.  Secondary adrenal insufficiency (HCC)  Panhypopituitarism (HCC)  Secondary hypothyroidism  Hypogonadotropic hypogonadism (HCC)   DISPOSITION Follow up in clinic in 4  months suggested.  Labs on the same day of the visit.   All questions answered and patient verbalized understanding of the plan.  Carl Antonis Lor, MD [facility] [address] [place], [address] [place], [place] [address] Phone # [phone]  At least part of this note was generated using voice recognition software. Inadvertent word errors may have occurred, which were not recognized during the proofreading process.

## 2024-02-18 DIAGNOSIS — C642 Malignant neoplasm of left kidney, except renal pelvis: Secondary | ICD-10-CM | POA: Diagnosis not present

## 2024-02-18 DIAGNOSIS — N183 Chronic kidney disease, stage 3 unspecified: Secondary | ICD-10-CM | POA: Diagnosis not present

## 2024-02-25 ENCOUNTER — Other Ambulatory Visit: Payer: Self-pay | Admitting: Urology

## 2024-02-25 DIAGNOSIS — C642 Malignant neoplasm of left kidney, except renal pelvis: Secondary | ICD-10-CM

## 2024-03-16 ENCOUNTER — Telehealth: Payer: Self-pay

## 2024-03-16 NOTE — Telephone Encounter (Signed)
 [facility] called to see if office had an alternate number for patient.

## 2024-03-26 ENCOUNTER — Ambulatory Visit
Admission: RE | Admit: 2024-03-26 | Discharge: 2024-03-26 | Disposition: A | Discharge status: Home / Self Care | Source: Ambulatory Visit | Attending: Urology | Admitting: Urology

## 2024-03-26 DIAGNOSIS — C649 Malignant neoplasm of unspecified kidney, except renal pelvis: Secondary | ICD-10-CM | POA: Diagnosis not present

## 2024-03-26 DIAGNOSIS — K769 Liver disease, unspecified: Secondary | ICD-10-CM | POA: Diagnosis not present

## 2024-03-26 DIAGNOSIS — C642 Malignant neoplasm of left kidney, except renal pelvis: Secondary | ICD-10-CM

## 2024-03-26 MED ORDER — GADOPICLENOL 0.5 MMOL/ML IV SOLN
10.0000 mL | Freq: Once | INTRAVENOUS | Status: AC | PRN
  Administered 2024-03-26: 10 mL via INTRAVENOUS

## 2024-04-01 ENCOUNTER — Other Ambulatory Visit: Payer: Self-pay | Admitting: Urology

## 2024-04-01 ENCOUNTER — Other Ambulatory Visit: Payer: Self-pay | Admitting: Interventional Radiology

## 2024-04-01 DIAGNOSIS — C649 Malignant neoplasm of unspecified kidney, except renal pelvis: Secondary | ICD-10-CM

## 2024-04-01 DIAGNOSIS — C642 Malignant neoplasm of left kidney, except renal pelvis: Secondary | ICD-10-CM

## 2024-04-02 ENCOUNTER — Other Ambulatory Visit: Payer: Self-pay | Admitting: Family Medicine

## 2024-04-02 DIAGNOSIS — I10 Essential (primary) hypertension: Secondary | ICD-10-CM

## 2024-04-02 DIAGNOSIS — E785 Hyperlipidemia, unspecified: Secondary | ICD-10-CM

## 2024-04-14 ENCOUNTER — Ambulatory Visit
Admission: RE | Admit: 2024-04-14 | Discharge: 2024-04-14 | Disposition: A | Source: Ambulatory Visit | Attending: Interventional Radiology | Admitting: Interventional Radiology

## 2024-04-14 DIAGNOSIS — C642 Malignant neoplasm of left kidney, except renal pelvis: Secondary | ICD-10-CM

## 2024-04-14 HISTORY — PX: IR RADIOLOGIST EVAL & MGMT: IMG5224

## 2024-04-14 NOTE — Progress Notes (Signed)
 Chief Complaint: Patient was consulted remotely today (TeleHealth) for follow up after cryoablation of a left renal carcinoma.   History of Present Illness: Carl Gutierrez is a 78 y.o. male status post cryoablation of a 1.8 cm left posterior lower pole biopsy-proven clear-cell renal carcinoma on 12/04/2021. He is currently asymptomatic and denies any pain or urinary symptoms. A follow up MRI of the abdomen was performed on 03/26/24.   Past Medical History:  Diagnosis Date   CAD S/P percutaneous coronary angioplasty February 2004; 2008   PCI-dLAD: 2.25 mm x 16 mm Express BMS; Ramus- 2.75 mm x 18 mm Cypher DES (2.8 mm); follow-up 11/'08: Patent LAD/ramus stents. 90% small OM. EF 50-60%.; Myoview  1/'12: Exercise 9 min, 10 METS with no ischemia/infarction.    Clotting disorder    Diabetes mellitus    Low-dose metformin    DJD (degenerative joint disease), lumbosacral     status post L4-L5 surgery   Essential hypertension    History of DVT of lower extremity     postoperative   Hyperlipidemia with target LDL less than 70    Hypothyroidism    Synthroid    Panhypopituitarism    Status post pituitary adenoma removal in 2012   Recurrent deep vein thrombosis (DVT) (HCC) 10/01/2015    Past Surgical History:  Procedure Laterality Date   ARTHROSCOPY KNEE W/ DRILLING Left    BRAIN SURGERY  06/16/2010   Removal of pituitary adenoma   CARDIAC CATHETERIZATION  04/17/2007   Significant LAD tapering but no significant disease the distal stent. Patent Ramus stent. 90% lesion in small OM 2; small nondominant RCA. Medical therapy. EF 50-60%.   CORONARY ANGIOPLASTY WITH STENT PLACEMENT  07/17/2002   PCI-dLAD: 2.25 mm x 16 mm BMS;;PCI- RI: 2.75 mm 18 mm Cypher DES   IR RADIOLOGIST EVAL & MGMT  11/04/2021   IR RADIOLOGIST EVAL & MGMT  12/31/2021   IR RADIOLOGIST EVAL & MGMT  02/27/2022   IR RADIOLOGIST EVAL & MGMT  03/09/2023   NM MYOVIEW  LTD  06/16/2010   a) Exercise ~9 min - 10 METS; no ischemia or  infarction; b) 06/2021: LOW RISK.  Normal LV size and function.  EF 55 to 60%.  No RWMA.  No ischemia or infarct.  Fixed inferior basal apical defect with normal motion suggestive of artifact.  (Diaphragmatic attenuation)   PROSTATECTOMY N/A 08/13/2015   Procedure: TRANSVESICLE OPEN PROSTATECTOMY** ;  Surgeon: Arlena Gal, MD;  Location: [facility];  Service: Urology;  Laterality: N/A;   RADIOLOGY WITH ANESTHESIA N/A 12/04/2021   Procedure: LEFT RENAL CRYO ABLATION;  Surgeon: Luverne Aran, MD;  Location: [facility];  Service: Radiology;  Laterality: N/A;   SPINE SURGERY     L4-L5    Allergies: Hydrochlorothiazide  and Other  Medications: Prior to Admission medications   Medication Sig Start Date End Date Taking? Authorizing Provider  Alcohol Swabs (PHARMACIST CHOICE ALCOHOL) PADS SMARTSIG:1 Each Topical 4 Times Daily 07/17/21   [provider]  amLODipine  (NORVASC ) 10 MG tablet TAKE 1 TABLET BY MOUTH EVERY DAY 10/20/23   Levora Reyes SAUNDERS, MD  aspirin 81 MG tablet Take 81 mg by mouth daily. Reported on 09/05/2015    [provider]  B-D UF III MINI PEN NEEDLES 31G X 5 MM MISC USE FOR INSULIN  PEN TWICE A DAY 04/29/21   [place], MD  Blood Glucose Calibration (ACCU-CHEK GUIDE CONTROL) LIQD  07/06/23   [provider]  Blood Glucose Monitoring Suppl DEVI 1 each by Does not  apply route in the morning, at noon, and at bedtime. May substitute to any manufacturer covered by patient's insurance. 06/02/23   Thapa, [place], MD  Blood Glucose Monitoring Suppl DEVI 1 each by Does not apply route in the morning, at noon, and at bedtime. May substitute to any manufacturer covered by patient's insurance. 01/26/24   Thapa, [place], MD  Dapagliflozin  Pro-metFORMIN  ER (XIGDUO  XR) 10-998 MG TB24 Take 1 tablet by mouth daily. 06/02/23   Thapa, [place], MD  Glucose Blood (BLOOD GLUCOSE TEST STRIPS) STRP 1 each by In Vitro route in the morning, at noon, and at bedtime. May substitute to any  manufacturer covered by patient's insurance. 01/26/24 06/07/24  Thapa, [place], MD  glucose blood (ONETOUCH VERIO) test strip USE AS INSTRUCTED TO CHECK BLOOD SUGAR 2x  DAILY. 06/02/23   Thapa, Sudan, MD  hydrocortisone  (CORTEF ) 20 MG tablet Take 1 tablet in the morning and half tablet at 5 PM 06/02/23   Thapa, Sudan, MD  Insulin  Lispro Prot & Lispro (HUMALOG  75/25 MIX) (75-25) 100 UNIT/ML Kwikpen INJECT 20 UNITS UNDER THE SKIN BEFORE BREAKFAST AND 6 UNITS BEFORE SUPPER. (REPLACES 70/30) 12/21/23   Thapa, Sudan, MD  Lancet Devices (SIMPLE DIAGNOSTICS LANCING DEV) MISC Apply topically. 07/06/23   [provider]  levothyroxine  (SYNTHROID ) 88 MCG tablet Take 1 tablet (88 mcg total) by mouth daily. 06/02/23   Thapa, [place], MD  metoprolol  succinate (TOPROL -XL) 25 MG 24 hr tablet TAKE 1 TABLET (25 MG TOTAL) BY MOUTH DAILY. 04/04/24   Levora Reyes SAUNDERS, MD  nitroGLYCERIN  (NITROSTAT ) 0.4 MG SL tablet Place 1 tablet (0.4 mg total) under the tongue every 5 (five) minutes as needed for chest pain. 07/01/21 01/26/24  Jordan, Peter M, MD  Pharmacist Choice Lancets MISC  07/06/23   [provider]  rosuvastatin  (CRESTOR ) 40 MG tablet TAKE 1 TABLET BY MOUTH EVERY DAY 04/04/24   Levora Reyes SAUNDERS, MD     Family History  Problem Relation Age of Onset   Cancer Mother        pancreatic ca   Sleep apnea Neg Hx     Social History   Socioeconomic History   Marital status: Married    Spouse name: Not on file   Number of children: Not on file   Years of education: Not on file   Highest education level: Not on file  Occupational History   Not on file  Tobacco Use   Smoking status: Former    Current packs/day: 0.00    Types: Cigarettes    Quit date: 06/24/1993    Years since quitting: 30.8   Smokeless tobacco: Never  Vaping Use   Vaping status: Never Used  Substance and Sexual Activity   Alcohol use: No   Drug use: No   Sexual activity: Not Currently    Comment: Local truck driver, 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 Exercises only occasionally. Not as much as he desires 2. Works as a arts administrator.   He does not smoke cigarettes -- quit in 1995. Does not drink alcohol.   Social Drivers of Corporate Investment Banker Strain: Low Risk  (10/20/2023)   Overall Financial Resource Strain (CARDIA)    Difficulty of Paying Living Expenses: Not hard at all  Food Insecurity: No Food Insecurity (10/20/2023)   Hunger Vital Sign  Worried About Programme Researcher, Broadcasting/film/video in the Last Year: Never true    Ran Out of Food in the Last Year: Never true  Transportation Needs: No Transportation Needs (10/20/2023)   PRAPARE - Administrator, Civil Service (Medical): No    Lack of Transportation (Non-Medical): No  Physical Activity: Inactive (10/20/2023)   Exercise Vital Sign    Days of Exercise per Week: 0 days    Minutes of Exercise per Session: 0 min  Stress: No Stress Concern Present (10/20/2023)   [employer] of Occupational Health - Occupational Stress Questionnaire    Feeling of Stress : Not at all  Social Connections: Unknown (10/20/2023)   Social Connection and Isolation Panel    Frequency of Communication with Friends and Family: More than three times a week    Frequency of Social Gatherings with Friends and Family: More than three times a week    Attends Religious Services: More than 4 times per year    Active Member of [employer] or Organizations: Not on file    Attends Banker Meetings: Never    Marital Status: Not on file    ECOG Status: 0 - Asymptomatic  Review of Systems  Constitutional: Negative.   Respiratory: Negative.    Cardiovascular: Negative.   Gastrointestinal: Negative.   Genitourinary: Negative.   Musculoskeletal: Negative.   Neurological: Negative.     Review of Systems: A 12 point ROS discussed and pertinent positives are  indicated in the HPI above.  All other systems are negative   Physical Exam No direct physical exam was performed (except for noted visual exam findings with Video Visits).   Vital Signs: There were no vitals taken for this visit.  Imaging: MR ABDOMEN WWO CONTRAST Result Date: 03/26/2024 CLINICAL DATA:  Renal cell carcinoma. EXAM: MRI ABDOMEN WITHOUT AND WITH CONTRAST TECHNIQUE: Multiplanar multisequence MR imaging of the abdomen was performed both before and after the administration of intravenous contrast. CONTRAST:  10 mL of Vueway. COMPARISON:  MRI abdomen from 03/03/2023. FINDINGS: Lower chest: Unremarkable MR appearance to the lung bases. No pleural effusion. No pericardial effusion. Normal heart size. Hepatobiliary: The liver is normal in size. Noncirrhotic configuration. There are at least 3, arterial hyperenhancing lesions in the liver (series 11, images 26, 31 and 40), with largest subcapsular 9 x 9 mm lesion in the right hepatic lobe, segment 8 (series 11, image 25), which enhances more than the background liver parenchyma on portal venous phase and delayed/equilibrium phase images. On retrospect, these lesions can be seen on the prior exam. The lesion seen on image 25 appears slightly enlarged in size however, the other 2 lesions appear grossly unchanged. These are indeterminate and differential diagnosis includes hypervascular renal metastases, benign lesion such as focal nodular hyperplasia, atypical hemangioma, etc. Attention on follow-up examination is recommended. No intrahepatic or extrahepatic bile duct dilatation. No choledocholithiasis. Unremarkable gallbladder. Pancreas: Normal T1 hyperintense appearance of the pancreas. Redemonstration of several, T2 hyperintense Nonenhancing structures in the pancreatic head/uncinate process, neck and proximal body region with larger lesion in the head/uncinate process junction region measuring up to 8 x 11 mm when measured orthogonally on coronal  plane. The communication of these lesions with pancreatic side branch or main duct is not well evaluated on this exam without MRCP images; however, these are favored to represent pancreatic side-branch IPMN. No significant interval change since the prior study. Rest of the pancreas is otherwise within normal limits. No suspicious lesion. No peripancreatic fat stranding.  Spleen: Within normal limits in size and appearance. No focal mass. Adrenals/Urinary Tract: Unremarkable adrenal glands. Redemonstration of previously seen post cryoablation lesion in the left kidney lower pole, posterolaterally. The lesion currently measures approximately 1.1 x 1.5 cm, slightly decreased since the prior study. There is no abnormal enhancement/viable tumor on postcontrast images. No new or suspicious renal lesion. There are multiple simple cortical cysts as well as sinus cysts throughout bilateral kidneys with largest cortical cyst arising from the right kidney lower pole, medially measuring up to 2.1 x 2.7 cm. No hydroureteronephrosis on either side. Stomach/Bowel: Visualized portions within the abdomen are unremarkable. No disproportionate dilation of bowel loops. Unremarkable appendix. There are scattered colonic diverticula without diverticulitis. Vascular/Lymphatic: No pathologically enlarged lymph nodes identified. No abdominal aortic aneurysm demonstrated. No ascites. Other:  None. Musculoskeletal: No suspicious bone lesions identified. IMPRESSION: 1. Redemonstration of post cryoablation lesion in the left kidney lower pole without abnormal enhancement/viable tumor. No new or suspicious renal lesion. 2. There are at least 3, arterial hyperenhancing lesions in the liver, as described above. These are indeterminate and differential diagnosis includes hypervascular renal metastases, benign lesion such as focal nodular hyperplasia, atypical hemangioma, etc. Attention on follow-up examination is recommended. 3. Redemonstration of  several, nonaggressive pancreatic lesions with largest in the head/uncinate process junction region measuring up to 8 x 11 mm. These are favored to represent pancreatic side-branch IPMN. No significant interval change since the prior study. Attention on follow-up examination is recommended. Electronically Signed   By: Ree Molt M.D.   On: 03/26/2024 13:13    Labs:  CBC: No results for input(s): WBC, HGB, HCT, PLT in the last 8760 hours.  COAGS: No results for input(s): INR, APTT in the last 8760 hours.  BMP: Recent Labs    05/26/23 0854 09/14/23 0822 10/21/23 0954  NA 142 141 138  K 4.7 4.6 3.7  CL 110 111* 105  CO2 21 23 24   GLUCOSE 109* 115* 85  BUN 24 27* 23  CALCIUM  9.2 9.2 9.5  CREATININE 1.71* 1.56* 1.51*    LIVER FUNCTION TESTS: Recent Labs    10/21/23 0954  BILITOT 0.4  AST 19  ALT 14  ALKPHOS 59  PROT 7.3  ALBUMIN  4.3    Assessment and Plan:  MRI on 03/26/24 demonstrates a retracting ablation defect of the left kidney since MRI last year. There is no enhancement to suggest recurrence and no new suspicious renal lesions. Three small areas of arterial perfusion were noted in the right lobe of the liver which appear stable on the piror studies in 2024 and 2023 but are better seen on the current study due to less respiratory motion and better arterial timing. Given small size and stability, these are favored to be small benign perfusion defects, and will be followed on subsequent exams. I recommended a follow up MRI in one year.    Electronically Signed: Marcey ONEIDA Moan 04/14/2024, 3:52 PM    I spent a total of 10 Minutes in remote  clinical consultation, greater than 50% of which was counseling/coordinating care post ablation of a left renal carcinoma.    Visit type: Audio only (telephone). Audio (no video) only due to patient's lack of internet/smartphone capability. Alternative for in-person consultation at [facility], [address] [place], [place], [place]. This visit type was conducted due to national recommendations for restrictions regarding the COVID-19 Pandemic (e.g. social distancing).  This format is felt to be most appropriate for this patient at this time.  All issues noted in this document were discussed and addressed.

## 2024-04-25 ENCOUNTER — Ambulatory Visit: Admitting: Family Medicine

## 2024-05-03 ENCOUNTER — Other Ambulatory Visit

## 2024-05-04 ENCOUNTER — Ambulatory Visit (INDEPENDENT_AMBULATORY_CARE_PROVIDER_SITE_OTHER): Admitting: Family Medicine

## 2024-05-04 VITALS — BP 116/68 | HR 64 | Temp 97.5°F | Ht 71.0 in | Wt 221.8 lb

## 2024-05-04 DIAGNOSIS — G4733 Obstructive sleep apnea (adult) (pediatric): Secondary | ICD-10-CM

## 2024-05-04 DIAGNOSIS — E785 Hyperlipidemia, unspecified: Secondary | ICD-10-CM

## 2024-05-04 DIAGNOSIS — N1832 Chronic kidney disease, stage 3b: Secondary | ICD-10-CM | POA: Diagnosis not present

## 2024-05-04 DIAGNOSIS — I10 Essential (primary) hypertension: Secondary | ICD-10-CM

## 2024-05-04 LAB — COMPREHENSIVE METABOLIC PANEL WITH GFR
ALT: 12 U/L (ref 0–53)
AST: 12 U/L (ref 0–37)
Albumin: 4.1 g/dL (ref 3.5–5.2)
Alkaline Phosphatase: 59 U/L (ref 39–117)
BUN: 20 mg/dL (ref 6–23)
CO2: 26 meq/L (ref 19–32)
Calcium: 9.1 mg/dL (ref 8.4–10.5)
Chloride: 106 meq/L (ref 96–112)
Creatinine, Ser: 1.37 mg/dL (ref 0.40–1.50)
GFR: 48.75 mL/min — ABNORMAL LOW (ref >60.00)
Glucose, Bld: 121 mg/dL — ABNORMAL HIGH (ref 70–99)
Potassium: 3.7 meq/L (ref 3.5–5.1)
Sodium: 140 meq/L (ref 135–145)
Total Bilirubin: 0.4 mg/dL (ref 0.2–1.2)
Total Protein: 7.4 g/dL (ref 6.0–8.3)

## 2024-05-04 LAB — LIPID PANEL
Cholesterol: 99 mg/dL (ref 0–200)
HDL: 38.2 mg/dL — ABNORMAL LOW (ref >39.00)
LDL Cholesterol: 42 mg/dL (ref 0–99)
NonHDL: 61.06
Total CHOL/HDL Ratio: 3
Triglycerides: 94 mg/dL (ref 0.0–149.0)
VLDL: 18.8 mg/dL (ref 0.0–40.0)

## 2024-05-04 MED ORDER — METOPROLOL SUCCINATE ER 25 MG PO TB24: 25.0000 mg | ORAL_TABLET | Freq: Every day | ORAL | 1 refills | Status: AC

## 2024-05-04 MED ORDER — AMLODIPINE BESYLATE 10 MG PO TABS: 10.0000 mg | ORAL_TABLET | Freq: Every day | ORAL | 1 refills | Status: AC

## 2024-05-04 NOTE — Progress Notes (Signed)
 Subjective:  Patient ID: Carl Gutierrez, male    DOB: 1946-03-15  Age: 78 y.o. MRN: [id number]  CC:  Chief Complaint  Patient presents with   Hypertension    Will check BP at home sometimes.     HPI [name], [name] presents for chronic condition follow-up.  Physical in May.  Followed by endocrinology for hypopituitarism, diabetes.  Followed by cardiology for history of CAD, hyperlipidemia.  Cryoablation of left renal mass in June 2023.  MRI of abdomen 03/26/2024 noted.  No new or suspicious renal lesions.  3 arterial hyperenhancing lesions in the liver that were indeterminant.  Attention on follow-up exam recommended.  Several nonaggressive pancreatic lesions favored to represent pancreatic sidebranch IPMN with no significant interval change since prior study.  Repeat MRI scheduled in April.  Urology Dr. Devere, appointment September 16.  Surveillance MRI as above.  Office visit in 1 year.   Hypertension: With CKD stage IIIb.  Has been followed by nephrology.  Appointment with nephrology in May.  Dr. Jerrye.  CKD thought to be secondary to microvascular disease with hypertension as well as diabetic nephropathy.  Cryoablation of left renal mass in June 2023.  NSAID avoidance.  Off metformin  if GFR less than 30.  Also plan to monitor Crestor  dosing, recommended no more than 10 mg if GFR below 30.  eGFR 43 on 10/21/2023 Also with history of OSA on CPAP.  Severe OSA,  central sleep apnea. Hypertension treated with amlodipine  10 mg daily, Toprol -XL 25 mg daily. Home readings: normal, unknown readings.  Using CPAP.  BP Readings from Last 3 Encounters:  05/04/24 116/68  01/26/24 132/60  10/21/23 138/72   Lab Results  Component Value Date   CREATININE 1.51 (H) 10/21/2023   Hyperlipidemia: With CAD as above treated by cardiology, Dr. Anner.  He is on aspirin as well as rosuvastatin , currently 40 mg daily. No CP/DOE, no myalgias or med side effects.  Lab Results  Component Value Date   CHOL 104  10/21/2023   HDL 37.70 (L) 10/21/2023   LDLCALC 46 10/21/2023   LDLDIRECT 85.0 05/18/2014   TRIG 103.0 10/21/2023   CHOLHDL 3 10/21/2023   Lab Results  Component Value Date   ALT 14 10/21/2023   AST 19 10/21/2023   ALKPHOS 59 10/21/2023   BILITOT 0.4 10/21/2023   HM: Optho 4 months ago. Optho at [facility] Hep vaccines at [facility]. Twinrix 10/10 at pharmacy.  Covid booster - had at pharmacy in October?  History Patient Active Problem List   Diagnosis Date Noted   Exposure to potentially hazardous substance 10/21/2023   Age-related cataract 04/15/2023   Age-related nuclear cataract, bilateral 04/15/2023   Hearing loss 04/15/2023   Open angle with borderline findings, low risk, bilateral 04/15/2023   Pain in joint, lower leg 04/15/2023   Coronary artery disease 04/15/2023   Encounter for other administrative examinations 04/15/2023   Encounter for immunization 04/15/2023   Hypopituitarism 04/15/2023   Left knee pain 04/03/2023   OSA (obstructive sleep apnea) 04/01/2023   Central sleep apnea 04/01/2023   Left renal mass 12/04/2021   Chest pain with low risk for cardiac etiology 09/08/2021   Gout of big toe 10/31/2020   Stage 3a chronic kidney disease (HCC) 01/04/2020   Anemia in chronic kidney disease 11/30/2015   Recurrent deep vein thrombosis (DVT) (HCC) 10/01/2015   Hematuria, unspecified 10/01/2015   Elevated serum creatinine 10/01/2015   BPH (benign prostatic hyperplasia) 08/13/2015   Benign localized hyperplasia of prostate with urinary obstruction  07/23/2015   Preop cardiovascular exam 07/17/2015   Encounter for CDL (commercial driving license) exam 98/68/7982   Obesity (BMI 30-39.9) 06/26/2013   Essential hypertension 06/26/2013   Panhypopituitarism 11/17/2011     11/17/2011   Diabetes mellitus (HCC) 11/17/2011   Hyperlipidemia associated with type 2 diabetes mellitus (HCC) 11/17/2011   Coronary artery disease involving native heart without angina pectoris 07/19/2002    Past Medical History:  Diagnosis Date   CAD S/P percutaneous coronary angioplasty February 2004; 2008   PCI-dLAD: 2.25 mm x 16 mm Express BMS; Ramus- 2.75 mm x 18 mm Cypher DES (2.8 mm); follow-up 11/'08: Patent LAD/ramus stents. 90% small OM. EF 50-60%.; Myoview  1/'12: Exercise 9 min, 10 METS with no ischemia/infarction.    Clotting disorder    Diabetes mellitus    Low-dose metformin    DJD (degenerative joint disease), lumbosacral     status post L4-L5 surgery   Essential hypertension    History of DVT of lower extremity     postoperative   Hyperlipidemia with target LDL less than 70    Hypothyroidism    Synthroid    Panhypopituitarism    Status post pituitary adenoma removal in 2012   Recurrent deep vein thrombosis (DVT) (HCC) 10/01/2015   Past Surgical History:  Procedure Laterality Date   ARTHROSCOPY KNEE W/ DRILLING Left    BRAIN SURGERY  06/16/2010   Removal of pituitary adenoma   CARDIAC CATHETERIZATION  04/17/2007   Significant LAD tapering but no significant disease the distal stent. Patent Ramus stent. 90% lesion in small OM 2; small nondominant RCA. Medical therapy. EF 50-60%.   CORONARY ANGIOPLASTY WITH STENT PLACEMENT  07/17/2002   PCI-dLAD: 2.25 mm x 16 mm BMS;;PCI- RI: 2.75 mm 18 mm Cypher DES   IR RADIOLOGIST EVAL & MGMT  11/04/2021   IR RADIOLOGIST EVAL & MGMT  12/31/2021   IR RADIOLOGIST EVAL & MGMT  02/27/2022   IR RADIOLOGIST EVAL & MGMT  03/09/2023   IR RADIOLOGIST EVAL & MGMT  04/14/2024   NM MYOVIEW  LTD  06/16/2010   a) Exercise ~9 min - 10 METS; no ischemia or infarction; b) 06/2021: LOW RISK.  Normal LV size and function.  EF 55 to 60%.  No RWMA.  No ischemia or infarct.  Fixed inferior basal apical defect with normal motion suggestive of artifact.  (Diaphragmatic attenuation)   PROSTATECTOMY N/A 08/13/2015   Procedure: TRANSVESICLE OPEN PROSTATECTOMY** ;  Surgeon: Arlena Gal, MD;  Location: [facility];  Service: Urology;  Laterality: N/A;   RADIOLOGY  WITH ANESTHESIA N/A 12/04/2021   Procedure: LEFT RENAL CRYO ABLATION;  Surgeon: Luverne Aran, MD;  Location: [facility];  Service: Radiology;  Laterality: N/A;   SPINE SURGERY     L4-L5   Allergies  Allergen Reactions   Hydrochlorothiazide      Leg and side pain with this   Other     Blood pressure med name unknown.   Prior to Admission medications   Medication Sig Start Date End Date Taking? Authorizing Provider  Alcohol Swabs (PHARMACIST CHOICE ALCOHOL) PADS SMARTSIG:1 Each Topical 4 Times Daily 07/17/21  Yes [provider]  amLODipine  (NORVASC ) 10 MG tablet TAKE 1 TABLET BY MOUTH EVERY DAY 10/20/23  Yes Levora Carl SAUNDERS, MD  aspirin 81 MG tablet Take 81 mg by mouth daily. Reported on 09/05/2015   Yes [provider]  B-D UF III MINI PEN NEEDLES 31G X 5 MM MISC USE FOR INSULIN  PEN TWICE A DAY 04/29/21  Yes Von Pacific,  MD  Blood Glucose Calibration (ACCU-CHEK GUIDE CONTROL) LIQD  07/06/23  Yes [provider]  Blood Glucose Monitoring Suppl DEVI 1 each by Does not apply route in the morning, at noon, and at bedtime. May substitute to any manufacturer covered by patient's insurance. 06/02/23  Yes Thapa, Sudan, MD  Blood Glucose Monitoring Suppl DEVI 1 each by Does not apply route in the morning, at noon, and at bedtime. May substitute to any manufacturer covered by patient's insurance. 01/26/24  Yes Thapa, Sudan, MD  Dapagliflozin  Pro-metFORMIN  ER (XIGDUO  XR) 10-998 MG TB24 Take 1 tablet by mouth daily. 06/02/23  Yes Thapa, Sudan, MD  Glucose Blood (BLOOD GLUCOSE TEST STRIPS) STRP 1 each by In Vitro route in the morning, at noon, and at bedtime. May substitute to any manufacturer covered by patient's insurance. 01/26/24 06/07/24 Yes Thapa, Sudan, MD  hydrocortisone  (CORTEF ) 20 MG tablet Take 1 tablet in the morning and half tablet at 5 PM 06/02/23  Yes Thapa, Sudan, MD  Insulin  Lispro Prot & Lispro (HUMALOG  75/25 MIX) (75-25) 100 UNIT/ML Kwikpen INJECT 20 UNITS UNDER THE  SKIN BEFORE BREAKFAST AND 6 UNITS BEFORE SUPPER. (REPLACES 70/30) 12/21/23  Yes Thapa, Sudan, MD  Lancet Devices (SIMPLE DIAGNOSTICS LANCING DEV) MISC Apply topically. 07/06/23  Yes [provider]  levothyroxine  (SYNTHROID ) 88 MCG tablet Take 1 tablet (88 mcg total) by mouth daily. 06/02/23  Yes Thapa, Sudan, MD  metoprolol  succinate (TOPROL -XL) 25 MG 24 hr tablet TAKE 1 TABLET (25 MG TOTAL) BY MOUTH DAILY. 04/04/24  Yes Levora Carl SAUNDERS, MD  nitroGLYCERIN  (NITROSTAT ) 0.4 MG SL tablet Place 1 tablet (0.4 mg total) under the tongue every 5 (five) minutes as needed for chest pain. 07/01/21 05/04/24 Yes Jordan, Peter M, MD  rosuvastatin  (CRESTOR ) 40 MG tablet TAKE 1 TABLET BY MOUTH EVERY DAY 04/04/24  Yes Levora Carl SAUNDERS, MD  glucose blood (ONETOUCH VERIO) test strip USE AS INSTRUCTED TO CHECK BLOOD SUGAR 2x  DAILY. Patient not taking: Reported on 05/04/2024 06/02/23   Thapa, Sudan, MD  Pharmacist Choice Lancets MISC  07/06/23   [provider]   Social History   Socioeconomic History   Marital status: Married    Spouse name: Not on file   Number of children: Not on file   Years of education: Not on file   Highest education level: Not on file  Occupational History   Not on file  Tobacco Use   Smoking status: Former    Current packs/day: 0.00    Types: Cigarettes    Quit date: 06/24/1993    Years since quitting: 30.8   Smokeless tobacco: Never  Vaping Use   Vaping status: Never Used  Substance and Sexual Activity   Alcohol use: No   Drug use: No   Sexual activity: Not Currently    Comment: Local truck driver, 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 Exercises only occasionally. Not as much as he desires 2. Works as a arts administrator.   He does not smoke cigarettes -- quit in 1995. Does not drink alcohol.   Social Drivers of Corporate Investment Banker  Strain: Low Risk  (10/20/2023)   Overall Financial Resource Strain (CARDIA)    Difficulty of Paying Living Expenses: Not hard at all  Food Insecurity: No Food Insecurity (10/20/2023)   Hunger Vital Sign  Worried About Programme Researcher, Broadcasting/film/video in the Last Year: Never true    Ran Out of Food in the Last Year: Never true  Transportation Needs: No Transportation Needs (10/20/2023)   PRAPARE - Administrator, Civil Service (Medical): No    Lack of Transportation (Non-Medical): No  Physical Activity: Inactive (10/20/2023)   Exercise Vital Sign    Days of Exercise per Week: 0 days    Minutes of Exercise per Session: 0 min  Stress: No Stress Concern Present (10/20/2023)   [employer] of Occupational Health - Occupational Stress Questionnaire    Feeling of Stress : Not at all  Social Connections: Unknown (10/20/2023)   Social Connection and Isolation Panel    Frequency of Communication with Friends and Family: More than three times a week    Frequency of Social Gatherings with Friends and Family: More than three times a week    Attends Religious Services: More than 4 times per year    Active Member of [employer] or Organizations: Not on file    Attends Banker Meetings: Never    Marital Status: Not on file  Intimate Partner Violence: Not At Risk (10/20/2023)   Humiliation, Afraid, Rape, and Kick questionnaire    Fear of Current or Ex-Partner: No    Emotionally Abused: No    Physically Abused: No    Sexually Abused: No    Review of Systems  Constitutional:  Negative for fatigue and unexpected weight change.  Eyes:  Positive for visual disturbance (Occasional blurry vision that has been discussed with his eye specialist, no acute changes.).  Respiratory:  Negative for cough, chest tightness and shortness of breath.   Cardiovascular:  Negative for chest pain, palpitations and leg swelling.  Gastrointestinal:  Negative for abdominal pain and blood in stool.  Neurological:  Negative  for dizziness, light-headedness and headaches.     Objective:   Vitals:   05/04/24 0811  BP: 116/68  Pulse: 64  Temp: (!) 97.5 F (36.4 C)  TempSrc: Temporal  SpO2: 95%  Weight: 221 lb 12.8 oz (100.6 kg)  Height: 5' 11 (1.803 m)     Physical Exam Vitals reviewed.  Constitutional:      Appearance: He is well-developed.  HENT:     Head: Normocephalic and atraumatic.  Neck:     Vascular: No carotid bruit or JVD.  Cardiovascular:     Rate and Rhythm: Normal rate and regular rhythm.     Heart sounds: Normal heart sounds. No murmur heard. Pulmonary:     Effort: Pulmonary effort is normal.     Breath sounds: Normal breath sounds. No rales.  Musculoskeletal:     Right lower leg: No edema.     Left lower leg: No edema.  Skin:    General: Skin is warm and dry.  Neurological:     Mental Status: He is alert and oriented to person, place, and time.  Psychiatric:        Mood and Affect: Mood normal.        Assessment & Plan:  Dawaun Brancato is a 78 y.o. male . Hyperlipidemia, unspecified hyperlipidemia type - Plan: Comprehensive metabolic panel with GFR, Lipid panel - Tolerating current dose Crestor , continue to keep an eye on renal function as may need to decrease dose of Crestor  if GFR under 30.  Continue same dose for now and adjust plan accordingly based on lab results.  CKD stage 3b, GFR 30-44 ml/min (HCC) - Plan: Comprehensive  metabolic panel with GFR  - Continue avoidance of NSAIDs, nephrology eval and plan as above.  Continue monitoring of renal function to adjust med regimen including his metformin  and Crestor  if GFR less than 30.  Essential hypertension - Plan: Comprehensive metabolic panel with GFR  - Stable with current med regimen, no changes.  Check labs and adjust plan accordingly  OSA (obstructive sleep apnea)  - Compliant with CPAP.  Continue same.  No orders of the defined types were placed in this encounter.  Patient Instructions  Thank you for  coming in today. No change in medications at this time. If there are any concerns on your bloodwork, I will let you know. Take care!     Signed,   Carl Pines, MD [facility], [facility] 05/04/24 8:52 AM

## 2024-05-04 NOTE — Patient Instructions (Signed)
 Thank you for coming in today. No change in medications at this time. If there are any concerns on your bloodwork, I will let you know. Take care!

## 2024-05-10 ENCOUNTER — Ambulatory Visit: Payer: Self-pay | Admitting: Family Medicine

## 2024-05-11 NOTE — Progress Notes (Signed)
 Lab results have been discussed.   Verbalized understanding? Yes  Are there any questions? No

## 2024-05-13 ENCOUNTER — Other Ambulatory Visit: Payer: Self-pay | Admitting: Endocrinology

## 2024-05-13 DIAGNOSIS — E118 Type 2 diabetes mellitus with unspecified complications: Secondary | ICD-10-CM

## 2024-05-31 ENCOUNTER — Ambulatory Visit: Admitting: Endocrinology

## 2024-06-07 ENCOUNTER — Ambulatory Visit
Admission: EM | Admit: 2024-06-07 | Discharge: 2024-06-07 | Disposition: A | Discharge status: Home / Self Care | Attending: Family Medicine | Admitting: Family Medicine

## 2024-06-07 ENCOUNTER — Encounter: Payer: Self-pay | Admitting: Emergency Medicine

## 2024-06-07 DIAGNOSIS — M25462 Effusion, left knee: Secondary | ICD-10-CM

## 2024-06-07 DIAGNOSIS — M25562 Pain in left knee: Secondary | ICD-10-CM

## 2024-06-07 DIAGNOSIS — M1712 Unilateral primary osteoarthritis, left knee: Secondary | ICD-10-CM | POA: Diagnosis not present

## 2024-06-07 MED ORDER — OXYCODONE-ACETAMINOPHEN 5-325 MG PO TABS: 1.0000 | ORAL_TABLET | Freq: Four times a day (QID) | ORAL | 0 refills | Status: AC | PRN

## 2024-06-07 NOTE — ED Triage Notes (Signed)
 Patient c/o left knee swelling x 1 month, requesting to have some fluid removed off of left knee.  The last 4-5 days have become unbearable unable to sleep at night.  Patient has taken Tylenol  for pain.

## 2024-06-07 NOTE — ED Provider Notes (Signed)
 " [facility]    CSN: [id number] Arrival date & time: 06/07/24  0935      History   Chief Complaint Chief Complaint  Patient presents with   Knee Pain    HPI Sircharles Gutierrez is a 78 y.o. male.   HPI  Patient has known osteoarthritis of both knees.  Has had multiple injections in both knees.  Currently his left knee is bothering him.  He states it is swollen, painful, and he can hardly put weight on it.  He is requesting the fluid be drained from his knee.  He states his care usually is at the [facility].  He states he has had x-rays at the [facility].  He states that they did Synvisc injections a long time ago that gave him some relief. He is diabetic.  He has responded well to cortisone injections in the past.  He is aware that it can temporarily increase his sugar  Past Medical History:  Diagnosis Date   CAD S/P percutaneous coronary angioplasty February 2004; 2008   PCI-dLAD: 2.25 mm x 16 mm Express BMS; Ramus- 2.75 mm x 18 mm Cypher DES (2.8 mm); follow-up 11/'08: Patent LAD/ramus stents. 90% small OM. EF 50-60%.; Myoview  1/'12: Exercise 9 min, 10 METS with no ischemia/infarction.    Clotting disorder    Diabetes mellitus    Low-dose metformin    DJD (degenerative joint disease), lumbosacral     status post L4-L5 surgery   Essential hypertension    History of DVT of lower extremity     postoperative   Hyperlipidemia with target LDL less than 70    Hypothyroidism    Synthroid    Panhypopituitarism    Status post pituitary adenoma removal in 2012   Recurrent deep vein thrombosis (DVT) (HCC) 10/01/2015    Patient Active Problem List   Diagnosis Date Noted   Exposure to potentially hazardous substance 10/21/2023   Age-related cataract 04/15/2023   Age-related nuclear cataract, bilateral 04/15/2023   Hearing loss 04/15/2023   Open angle with borderline findings, low risk, bilateral 04/15/2023   Pain in joint, lower leg 04/15/2023   Coronary artery disease 04/15/2023    Encounter for other administrative examinations 04/15/2023   Encounter for immunization 04/15/2023   Hypopituitarism 04/15/2023   Left knee pain 04/03/2023   OSA (obstructive sleep apnea) 04/01/2023   Central sleep apnea 04/01/2023   Left renal mass 12/04/2021   Chest pain with low risk for cardiac etiology 09/08/2021   Gout of big toe 10/31/2020   Stage 3a chronic kidney disease (HCC) 01/04/2020   Anemia in chronic kidney disease 11/30/2015   Recurrent deep vein thrombosis (DVT) (HCC) 10/01/2015   Hematuria, unspecified 10/01/2015   Elevated serum creatinine 10/01/2015   BPH (benign prostatic hyperplasia) 08/13/2015   Benign localized hyperplasia of prostate with urinary obstruction 07/23/2015   Preop cardiovascular exam 07/17/2015   Encounter for CDL (commercial driving license) exam 98/68/7982   Obesity (BMI 30-39.9) 06/26/2013   Essential hypertension 06/26/2013   Panhypopituitarism 11/17/2011     11/17/2011   Diabetes mellitus (HCC) 11/17/2011   Hyperlipidemia associated with type 2 diabetes mellitus (HCC) 11/17/2011   Coronary artery disease involving native heart without angina pectoris 07/19/2002    Past Surgical History:  Procedure Laterality Date   ARTHROSCOPY KNEE W/ DRILLING Left    BRAIN SURGERY  06/16/2010   Removal of pituitary adenoma   CARDIAC CATHETERIZATION  04/17/2007   Significant LAD tapering but no significant disease the distal stent. Patent Ramus  stent. 90% lesion in small OM 2; small nondominant RCA. Medical therapy. EF 50-60%.   CORONARY ANGIOPLASTY WITH STENT PLACEMENT  07/17/2002   PCI-dLAD: 2.25 mm x 16 mm BMS;;PCI- RI: 2.75 mm 18 mm Cypher DES   IR RADIOLOGIST EVAL & MGMT  11/04/2021   IR RADIOLOGIST EVAL & MGMT  12/31/2021   IR RADIOLOGIST EVAL & MGMT  02/27/2022   IR RADIOLOGIST EVAL & MGMT  03/09/2023   IR RADIOLOGIST EVAL & MGMT  04/14/2024   NM MYOVIEW  LTD  06/16/2010   a) Exercise ~9 min - 10 METS; no ischemia or infarction; b) 06/2021: LOW  RISK.  Normal LV size and function.  EF 55 to 60%.  No RWMA.  No ischemia or infarct.  Fixed inferior basal apical defect with normal motion suggestive of artifact.  (Diaphragmatic attenuation)   PROSTATECTOMY N/A 08/13/2015   Procedure: TRANSVESICLE OPEN PROSTATECTOMY** ;  Surgeon: Arlena Gal, MD;  Location: [facility];  Service: Urology;  Laterality: N/A;   RADIOLOGY WITH ANESTHESIA N/A 12/04/2021   Procedure: LEFT RENAL CRYO ABLATION;  Surgeon: Luverne Aran, MD;  Location: [facility];  Service: Radiology;  Laterality: N/A;   SPINE SURGERY     L4-L5       Home Medications    Prior to Admission medications  Medication Sig Start Date End Date Taking? Authorizing Provider  ACCU-CHEK GUIDE TEST test strip 1 EACH BY IN VITRO ROUTE IN THE MORNING, AT NOON, AND AT BEDTIME. MAY SUBSTITUTE TO ANY MANUFACTURER COVERED BY PATIENT'S INSURANCE. 05/16/24  Yes Thapa, Sudan, MD  Alcohol Swabs (PHARMACIST CHOICE ALCOHOL) PADS SMARTSIG:1 Each Topical 4 Times Daily 07/17/21  Yes [provider]  amLODipine  (NORVASC ) 10 MG tablet Take 1 tablet (10 mg total) by mouth daily. 05/04/24  Yes Levora Reyes SAUNDERS, MD  aspirin 81 MG tablet Take 81 mg by mouth daily. Reported on 09/05/2015   Yes [provider]  B-D UF III MINI PEN NEEDLES 31G X 5 MM MISC USE FOR INSULIN  PEN TWICE A DAY 04/29/21  Yes Von [place], MD  Blood Glucose Calibration (ACCU-CHEK GUIDE CONTROL) LIQD  07/06/23  Yes [provider]  Blood Glucose Monitoring Suppl DEVI 1 each by Does not apply route in the morning, at noon, and at bedtime. May substitute to any manufacturer covered by patient's insurance. 06/02/23  Yes Thapa, Sudan, MD  Blood Glucose Monitoring Suppl DEVI 1 each by Does not apply route in the morning, at noon, and at bedtime. May substitute to any manufacturer covered by patient's insurance. 01/26/24  Yes Thapa, Sudan, MD  Dapagliflozin  Pro-metFORMIN  ER (XIGDUO  XR) 10-998 MG TB24 Take 1 tablet by mouth daily.  06/02/23  Yes Thapa, Sudan, MD  glucose blood (ONETOUCH VERIO) test strip USE AS INSTRUCTED TO CHECK BLOOD SUGAR 2x  DAILY. 06/02/23  Yes Thapa, Sudan, MD  hydrocortisone  (CORTEF ) 20 MG tablet Take 1 tablet in the morning and half tablet at 5 PM 06/02/23  Yes Thapa, Sudan, MD  Insulin  Lispro Prot & Lispro (HUMALOG  75/25 MIX) (75-25) 100 UNIT/ML Kwikpen INJECT 20 UNITS UNDER THE SKIN BEFORE BREAKFAST AND 6 UNITS BEFORE SUPPER. (REPLACES 70/30) 12/21/23  Yes Thapa, Sudan, MD  Lancet Devices (SIMPLE DIAGNOSTICS LANCING DEV) MISC Apply topically. 07/06/23  Yes [provider]  levothyroxine  (SYNTHROID ) 88 MCG tablet Take 1 tablet (88 mcg total) by mouth daily. 06/02/23  Yes Thapa, Sudan, MD  metoprolol  succinate (TOPROL -XL) 25 MG 24 hr tablet Take 1 tablet (25 mg total) by mouth daily. 05/04/24  Yes Levora Reyes SAUNDERS, MD  oxyCODONE -acetaminophen  (PERCOCET/ROXICET) 5-325 MG tablet Take 1 tablet by mouth every 6 (six) hours as needed for severe pain (pain score 7-10). 06/07/24  Yes Maranda Jamee Jacob, MD  rosuvastatin  (CRESTOR ) 40 MG tablet TAKE 1 TABLET BY MOUTH EVERY DAY 04/04/24  Yes Levora Reyes SAUNDERS, MD  nitroGLYCERIN  (NITROSTAT ) 0.4 MG SL tablet Place 1 tablet (0.4 mg total) under the tongue every 5 (five) minutes as needed for chest pain. 07/01/21 05/04/24  Jordan, Peter M, MD    Family History Family History  Problem Relation Age of Onset   Cancer Mother        pancreatic ca   Sleep apnea Neg Hx     Social History Social History[1]   Allergies   Hydrochlorothiazide  and Other   Review of Systems Review of Systems See HPI  Physical Exam Triage Vital Signs ED Triage Vitals  Encounter Vitals Group     BP 06/07/24 1024 121/74     Girls Systolic BP Percentile --      Girls Diastolic BP Percentile --      Boys Systolic BP Percentile --      Boys Diastolic BP Percentile --      Pulse Rate 06/07/24 1024 81     Resp 06/07/24 1024 18     Temp 06/07/24 1024 98.4 F (36.9 C)      Temp Source 06/07/24 1024 Oral     SpO2 06/07/24 1024 96 %     Weight 06/07/24 1023 224 lb (101.6 kg)     Height 06/07/24 1023 5' 9 (1.753 m)     Head Circumference --      Peak Flow --      Pain Score 06/07/24 1023 10     Pain Loc --      Pain Education --      Exclude from Growth Chart --    No data found.  Updated Vital Signs BP 121/74 (BP Location: Right Arm)   Pulse 81   Temp 98.4 F (36.9 C) (Oral)   Resp 18   Ht 5' 9 (1.753 m)   Wt 101.6 kg   SpO2 96%   BMI 33.08 kg/m      Physical Exam Constitutional:      General: He is not in acute distress.    Appearance: He is well-developed.  HENT:     Head: Normocephalic and atraumatic.  Eyes:     Conjunctiva/sclera: Conjunctivae normal.     Pupils: Pupils are equal, round, and reactive to light.  Cardiovascular:     Rate and Rhythm: Normal rate.  Pulmonary:     Effort: Pulmonary effort is normal. No respiratory distress.  Abdominal:     General: There is no distension.     Palpations: Abdomen is soft.  Musculoskeletal:        General: Swelling, tenderness and deformity present. Normal range of motion.     Cervical back: Normal range of motion.     Comments: Left knee has limited range of motion, warmth, effusion, and crepitus.  Skin:    General: Skin is warm and dry.  Neurological:     Mental Status: He is alert.     Gait: Gait abnormal.      UC Treatments / Results  Labs (all labs ordered are listed, but only abnormal results are displayed) Labs Reviewed - No data to display  EKG   Radiology No results found.  Procedures Join Aspiration/Injection  Date/Time: 06/07/2024 12:49  PM  Performed by: Maranda Jamee Jacob, MD Authorized by: Maranda Jamee Jacob, MD   Consent:    Consent obtained:  Verbal   Consent given by:  Patient   Risks, benefits, and alternatives were discussed: yes     Alternatives discussed:  Alternative treatment Universal protocol:    Patient identity confirmed:  Verbally  with patient Location:    Location:  Knee Anesthesia:    Anesthesia method:  Local infiltration   Local anesthetic:  Lidocaine  1% w/o epi Procedure details:    Needle gauge:  18 G   Ultrasound guidance: no     Approach:  Lateral   Aspirate amount:  30   Aspirate characteristics:  Serous   Steroid injected: yes     Specimen collected: no   Post-procedure details:    Dressing:  Adhesive bandage   Procedure completion:  Tolerated  (including critical care time)  Medications Ordered in UC Medications - No data to display  Initial Impression / Assessment and Plan / UC Course  I have reviewed the triage vital signs and the nursing notes.  Pertinent labs & imaging results that were available during my care of the patient were reviewed by me and considered in my medical decision making (see chart for details).     Postinjection cares is reviewed Final Clinical Impressions(s) / UC Diagnoses   Final diagnoses:  Acute pain of left knee  Effusion of left knee  Primary osteoarthritis of left knee     Discharge Instructions      May continue Tylenol  for pain Take Percocet if pain is severe Percocet can cause drowsiness.  Do not drive on Percocet After a cortisone injection, you need to use ice for 20 minutes every couple of hours.  Do not put heat on the joint for a couple of days Call for problems   ED Prescriptions     Medication Sig Dispense Auth. Provider   oxyCODONE -acetaminophen  (PERCOCET/ROXICET) 5-325 MG tablet Take 1 tablet by mouth every 6 (six) hours as needed for severe pain (pain score 7-10). 15 tablet Maranda Jamee Jacob, MD      I have reviewed the PDMP during this encounter.    [1]  Social History Tobacco Use   Smoking status: Former    Current packs/day: 0.00    Types: Cigarettes    Quit date: 06/24/1993    Years since quitting: 30.9   Smokeless tobacco: Never  Vaping Use   Vaping status: Never Used  Substance Use Topics   Alcohol use: No   Drug  use: No     Maranda Jamee Jacob, MD 06/07/24 1249  "

## 2024-06-07 NOTE — Discharge Instructions (Signed)
 May continue Tylenol  for pain Take Percocet if pain is severe Percocet can cause drowsiness.  Do not drive on Percocet After a cortisone injection, you need to use ice for 20 minutes every couple of hours.  Do not put heat on the joint for a couple of days Call for problems

## 2024-06-20 DIAGNOSIS — Z794 Long term (current) use of insulin: Secondary | ICD-10-CM

## 2024-06-29 ENCOUNTER — Other Ambulatory Visit: Payer: Self-pay | Admitting: Endocrinology

## 2024-06-29 DIAGNOSIS — E038 Other specified hypothyroidism: Secondary | ICD-10-CM

## 2024-07-22 ENCOUNTER — Ambulatory Visit: Admitting: Endocrinology

## 2024-07-22 ENCOUNTER — Ambulatory Visit: Payer: Self-pay | Admitting: Endocrinology

## 2024-07-22 ENCOUNTER — Other Ambulatory Visit

## 2024-07-22 ENCOUNTER — Encounter: Payer: Self-pay | Admitting: Endocrinology

## 2024-07-22 VITALS — BP 140/78 | HR 62 | Ht 69.0 in | Wt 217.0 lb

## 2024-07-22 DIAGNOSIS — E2749 Other adrenocortical insufficiency: Secondary | ICD-10-CM

## 2024-07-22 DIAGNOSIS — E038 Other specified hypothyroidism: Secondary | ICD-10-CM

## 2024-07-22 DIAGNOSIS — E230 Hypopituitarism: Secondary | ICD-10-CM

## 2024-07-22 DIAGNOSIS — E118 Type 2 diabetes mellitus with unspecified complications: Secondary | ICD-10-CM

## 2024-07-22 LAB — BASIC METABOLIC PANEL WITH GFR
BUN/Creatinine Ratio: 14 (calc) (ref 6–22)
BUN: 24 mg/dL (ref 7–25)
CO2: 25 mmol/L (ref 20–32)
Calcium: 9.6 mg/dL (ref 8.6–10.3)
Chloride: 109 mmol/L (ref 98–110)
Creat: 1.67 mg/dL — ABNORMAL HIGH (ref 0.70–1.22)
Glucose, Bld: 105 mg/dL — ABNORMAL HIGH (ref 65–99)
Potassium: 4.5 mmol/L (ref 3.5–5.3)
Sodium: 143 mmol/L (ref 135–146)
eGFR: 41 mL/min/{1.73_m2} — ABNORMAL LOW

## 2024-07-22 LAB — T4, FREE: Free T4: 1.3 ng/dL (ref 0.8–1.8)

## 2024-07-22 LAB — POCT GLYCOSYLATED HEMOGLOBIN (HGB A1C): Hemoglobin A1C: 7 % — AB (ref 4.0–5.6)

## 2024-07-22 MED ORDER — DAPAGLIFLOZIN PRO-METFORMIN ER 5-1000 MG PO TB24: 1.0000 | ORAL_TABLET | Freq: Every day | ORAL | 3 refills | Status: AC

## 2024-07-22 MED ORDER — HYDROCORTISONE 20 MG PO TABS: ORAL_TABLET | ORAL | 3 refills | Status: AC

## 2024-07-22 NOTE — Progress Notes (Signed)
 "  Outpatient Endocrinology Note Carl Wessner, MD  07/22/24  Patient's Name: Carl Gutierrez    DOB: Dec 02, 1945    MRN: [id number]                                                    REASON OF VISIT: Follow up for type 2 diabetes mellitus / hypopituitarism  PCP: Levora Reyes SAUNDERS, MD  HISTORY OF PRESENT ILLNESS:   Carl Gutierrez is a 79 y.o. old male with past medical history listed below, is here for follow up for type 2 diabetes mellitus and hypopituitarism / secondary hypothyroidism / secondary adrenal insufficiency.  Pertinent Diabetes History: Patient was previously seen by Dr. Von and was last time seen in August 2024.  Chronic Diabetes Complications : Retinopathy: no ?. Last ophthalmology exam was done on 6-12 months, 05/2023, following with ophthalmology regularly.  Nephropathy: CKD III b, following with nephrology. Peripheral neuropathy: no Coronary artery disease: yes Stroke: no  Relevant comorbidities and cardiovascular risk factors: Obesity: yes Body mass index is 32.05 kg/m.  Hypertension: Yes  Hyperlipidemia : Yes, on statin   Current / Home Diabetic regimen includes:    INSULIN  regimen: Humalog  75/25 insulin , 20 units before breakfast and 6 before dinner   Non-insulin  hypoglycemic drugs: Xigduo  10/998 mg daily  Prior diabetic medications:  Glycemic data:   Glucometer One Touch Verio reflect download from January 28 2 [place] 1, 2026, some of the blood sugar 88, 84.  He denied CGM in the past.  Hypoglycemia: Patient has no hypoglycemic episodes. Patient has hypoglycemia awareness.  Factors modifying glucose control: 1.  Diabetic diet assessment: 3 meals a day.  2.  Staying active or exercising: Little walking.  Exercise limited due to knee pain.  3.  Medication compliance: compliant all of the time.  # Panhypopituitarism : -History of pituitary surgery for pituitary adenoma in 2012.  He has secondary hypothyroidism and secondary adrenal insufficiency,  taking levothyroxine  88  MCG daily and hydrocortisone  20 mg in the morning and 10 mg in the afternoon replacement daily. -He has hypogonadotropic hypogonadism, used to be on testosterone  supplement in the past in the form of AndroGel  and injectable testosterone , currently not on testosterone  supplement, patient does not want to try any supplement for previous note.  Interval history  Glucometer data as reviewed above.  He denies hypoglycemic symptoms.  Diabetes regimen as reviewed and noted above.  Hemoglobin A1c today is 7%.  He denies numbness and ting of the feet.  No vision problem.  He has been taking hydrocortisone  and levothyroxine  as noted above.  Denies palpitation and heat intolerance.  Denies lightheadedness or any weakness.  No other complaints today.  REVIEW OF SYSTEMS As per history of present illness.   PAST MEDICAL HISTORY: Past Medical History:  Diagnosis Date   CAD S/P percutaneous coronary angioplasty February 2004; 2008   PCI-dLAD: 2.25 mm x 16 mm Express BMS; Ramus- 2.75 mm x 18 mm Cypher DES (2.8 mm); follow-up 11/'08: Patent LAD/ramus stents. 90% small OM. EF 50-60%.; Myoview  1/'12: Exercise 9 min, 10 METS with no ischemia/infarction.    Clotting disorder    Diabetes mellitus    Low-dose metformin    DJD (degenerative joint disease), lumbosacral     status post L4-L5 surgery   Essential hypertension    History of DVT of lower extremity  postoperative   Hyperlipidemia with target LDL less than 70    Hypothyroidism    Synthroid    Panhypopituitarism    Status post pituitary adenoma removal in 2012   Recurrent deep vein thrombosis (DVT) (HCC) 10/01/2015    PAST SURGICAL HISTORY: Past Surgical History:  Procedure Laterality Date   ARTHROSCOPY KNEE W/ DRILLING Left    BRAIN SURGERY  06/16/2010   Removal of pituitary adenoma   CARDIAC CATHETERIZATION  04/17/2007   Significant LAD tapering but no significant disease the distal stent. Patent Ramus stent. 90% lesion  in small OM 2; small nondominant RCA. Medical therapy. EF 50-60%.   CORONARY ANGIOPLASTY WITH STENT PLACEMENT  07/17/2002   PCI-dLAD: 2.25 mm x 16 mm BMS;;PCI- RI: 2.75 mm 18 mm Cypher DES   IR RADIOLOGIST EVAL & MGMT  11/04/2021   IR RADIOLOGIST EVAL & MGMT  12/31/2021   IR RADIOLOGIST EVAL & MGMT  02/27/2022   IR RADIOLOGIST EVAL & MGMT  03/09/2023   IR RADIOLOGIST EVAL & MGMT  04/14/2024   NM MYOVIEW  LTD  06/16/2010   a) Exercise ~9 min - 10 METS; no ischemia or infarction; b) 06/2021: LOW RISK.  Normal LV size and function.  EF 55 to 60%.  No RWMA.  No ischemia or infarct.  Fixed inferior basal apical defect with normal motion suggestive of artifact.  (Diaphragmatic attenuation)   PROSTATECTOMY N/A 08/13/2015   Procedure: TRANSVESICLE OPEN PROSTATECTOMY** ;  Surgeon: Arlena Gal, MD;  Location: [facility];  Service: Urology;  Laterality: N/A;   RADIOLOGY WITH ANESTHESIA N/A 12/04/2021   Procedure: LEFT RENAL CRYO ABLATION;  Surgeon: Luverne Aran, MD;  Location: [facility];  Service: Radiology;  Laterality: N/A;   SPINE SURGERY     L4-L5    ALLERGIES: Allergies  Allergen Reactions   Hydrochlorothiazide      Leg and side pain with this   Other     Blood pressure med name unknown.    FAMILY HISTORY:  Family History  Problem Relation Age of Onset   Cancer Mother        pancreatic ca   Sleep apnea Neg Hx     SOCIAL HISTORY: Social History   Socioeconomic History   Marital status: Married    Spouse name: Not on file   Number of children: Not on file   Years of education: Not on file   Highest education level: Not on file  Occupational History   Not on file  Tobacco Use   Smoking status: Former    Current packs/day: 0.00    Types: Cigarettes    Quit date: 06/24/1993    Years since quitting: 31.0   Smokeless tobacco: Never  Vaping Use   Vaping status: Never Used  Substance and Sexual Activity   Alcohol use: No   Drug use: No   Sexual activity: Not Currently     Comment: Local truck driver, 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 Exercises only occasionally. Not as much as he desires 2. Works as a arts administrator.   He does not smoke cigarettes -- quit in 1995. Does not drink alcohol.   Social Drivers of Health   Tobacco Use: Medium Risk (07/22/2024)   Patient History    Smoking Tobacco Use: Former    Smokeless Tobacco Use: Never    Passive Exposure: Not  on file  Financial Resource Strain: Low Risk (10/20/2023)   Overall Financial Resource Strain (CARDIA)    Difficulty of Paying Living Expenses: Not hard at all  Food Insecurity: No Food Insecurity (10/20/2023)   Hunger Vital Sign    Worried About Running Out of Food in the Last Year: Never true    Ran Out of Food in the Last Year: Never true  Transportation Needs: No Transportation Needs (10/20/2023)   PRAPARE - Administrator, Civil Service (Medical): No    Lack of Transportation (Non-Medical): No  Physical Activity: Inactive (10/20/2023)   Exercise Vital Sign    Days of Exercise per Week: 0 days    Minutes of Exercise per Session: 0 min  Stress: No Stress Concern Present (10/20/2023)   [employer] of Occupational Health - Occupational Stress Questionnaire    Feeling of Stress : Not at all  Social Connections: Unknown (10/20/2023)   Social Connection and Isolation Panel    Frequency of Communication with Friends and Family: More than three times a week    Frequency of Social Gatherings with Friends and Family: More than three times a week    Attends Religious Services: More than 4 times per year    Active Member of Clubs or Organizations: Not on file    Attends Banker Meetings: Never    Marital Status: Not on file  Depression (PHQ2-9): Low Risk (10/20/2023)   Depression (PHQ2-9)    PHQ-2 Score: 4  Alcohol Screen: Low Risk (10/20/2023)   Alcohol Screen     Last Alcohol Screening Score (AUDIT): 0  Housing: Unknown (10/20/2023)   Housing Stability Vital Sign    Unable to Pay for Housing in the Last Year: No    Number of Times Moved in the Last Year: Not on file    Homeless in the Last Year: No  Utilities: Not At Risk (10/20/2023)   AHC Utilities    Threatened with loss of utilities: No  Health Literacy: Adequate Health Literacy (10/20/2023)   B1300 Health Literacy    Frequency of need for help with medical instructions: Never    MEDICATIONS:  Current Outpatient Medications  Medication Sig Dispense Refill   ACCU-CHEK GUIDE TEST test strip 1 EACH BY IN VITRO ROUTE IN THE MORNING, AT NOON, AND AT BEDTIME. MAY SUBSTITUTE TO ANY MANUFACTURER COVERED BY PATIENT'S INSURANCE. 100 strip 3   Alcohol Swabs (PHARMACIST CHOICE ALCOHOL) PADS SMARTSIG:1 Each Topical 4 Times Daily     amLODipine  (NORVASC ) 10 MG tablet Take 1 tablet (10 mg total) by mouth daily. 90 tablet 1   aspirin 81 MG tablet Take 81 mg by mouth daily. Reported on 09/05/2015     B-D UF III MINI PEN NEEDLES 31G X 5 MM MISC USE FOR INSULIN  PEN TWICE A DAY 100 each 7   Blood Glucose Calibration (ACCU-CHEK GUIDE CONTROL) LIQD      Blood Glucose Monitoring Suppl DEVI 1 each by Does not apply route in the morning, at noon, and at bedtime. May substitute to any manufacturer covered by patient's insurance. 1 each 0   Blood Glucose Monitoring Suppl DEVI 1 each by Does not apply route in the morning, at noon, and at bedtime. May substitute to any manufacturer covered by patient's insurance. 1 each 0   glucose blood (ONETOUCH VERIO) test strip USE AS INSTRUCTED TO CHECK BLOOD SUGAR 2x  DAILY. 300 strip 3   Insulin  Lispro Prot & Lispro (HUMALOG  75/25 MIX) (75-25) 100 UNIT/ML  Kwikpen INJECT 20 UNITS UNDER THE SKIN BEFORE BREAKFAST AND 6 UNITS BEFORE SUPPER. (REPLACES 70/30) 15 mL 2   Lancet Devices (SIMPLE DIAGNOSTICS LANCING DEV) MISC Apply topically.     levothyroxine  (SYNTHROID ) 88 MCG tablet TAKE 1  TABLET BY MOUTH EVERY DAY 90 tablet 3   metoprolol  succinate (TOPROL -XL) 25 MG 24 hr tablet Take 1 tablet (25 mg total) by mouth daily. 90 tablet 1   nitroGLYCERIN  (NITROSTAT ) 0.4 MG SL tablet Place 1 tablet (0.4 mg total) under the tongue every 5 (five) minutes as needed for chest pain. 90 tablet 3   oxyCODONE -acetaminophen  (PERCOCET/ROXICET) 5-325 MG tablet Take 1 tablet by mouth every 6 (six) hours as needed for severe pain (pain score 7-10). 15 tablet 0   rosuvastatin  (CRESTOR ) 40 MG tablet TAKE 1 TABLET BY MOUTH EVERY DAY 90 tablet 1   Dapagliflozin  Pro-metFORMIN  ER (XIGDUO  XR) 10-998 MG TB24 Take 1 tablet by mouth daily. 90 tablet 3   hydrocortisone  (CORTEF ) 20 MG tablet Take 1 tablet in the morning and half tablet at 5 PM and double dose in case of illness as instructed. Provide 175 tablets for 90 days supply. 175 tablet 3   No current facility-administered medications for this visit.    PHYSICAL EXAM: Vitals:   07/22/24 0951  BP: (!) 140/78  Pulse: 62  SpO2: 96%  Weight: 217 lb (98.4 kg)  Height: 5' 9 (1.753 m)    Body mass index is 32.05 kg/m.  Wt Readings from Last 3 Encounters:  07/22/24 217 lb (98.4 kg)  06/07/24 224 lb (101.6 kg)  05/04/24 221 lb 12.8 oz (100.6 kg)    General: Well developed, well nourished male in no apparent distress.  HEENT: AT/[place], no external lesions.  Eyes: Conjunctiva clear and no icterus. Neck: Neck supple  Lungs: Respirations not labored Neurologic: Alert, oriented, normal speech Extremities / Skin: Dry. Psychiatric: Does not appear depressed or anxious  Diabetic Foot Exam - Simple   Simple Foot Form Diabetic Foot exam was performed with the following findings: Yes 07/22/2024 10:16 AM  Visual Inspection No deformities, no ulcerations, no other skin breakdown bilaterally: Yes Sensation Testing Intact to touch and monofilament testing bilaterally: Yes Pulse Check Posterior Tibialis and Dorsalis pulse intact bilaterally: Yes Comments     LABS Reviewed Lab Results  Component Value Date   HGBA1C 6.5 (A) 01/26/2024   HGBA1C 6.7 (H) 09/14/2023   HGBA1C 7.0 (H) 05/26/2023   Lab Results  Component Value Date   FRUCTOSAMINE 253 10/24/2021   FRUCTOSAMINE 231 10/03/2020   FRUCTOSAMINE 254 11/29/2018   Lab Results  Component Value Date   CHOL 99 05/04/2024   HDL 38.20 (L) 05/04/2024   LDLCALC 42 05/04/2024   LDLDIRECT 85.0 05/18/2014   TRIG 94.0 05/04/2024   CHOLHDL 3 05/04/2024   Lab Results  Component Value Date   MICRALBCREAT 92 (H) 09/18/2023   MICRALBCREAT 4.3 02/14/2014   Lab Results  Component Value Date   CREATININE 1.37 05/04/2024   Lab Results  Component Value Date   GFR 48.75 (L) 05/04/2024    ASSESSMENT / PLAN  1. Controlled type 2 diabetes mellitus with complication, with long-term current use of insulin  (HCC)   2. Secondary adrenal insufficiency   3. Panhypopituitarism   4. Hypogonadotropic hypogonadism   5. Secondary hypothyroidism     Diabetes Mellitus type 2, complicated by CKD. - Diabetic status / severity: Controlled.  Lab Results  Component Value Date   HGBA1C 6.5 (A) 01/26/2024    -  Hemoglobin A1c goal : <6.5 - 7 %  Denies hypoglycemic symptoms.  Overall reasonable control of diabetes mellitus.  - Medications: No change.  INSULIN  regimen: Humalog  75/25 insulin , 20 units before breakfast and 6 before dinner   Non-insulin  hypoglycemic drugs: Xigduo  10/998 mg daily  - Home glucose testing: At least 2 times a day in the morning fasting and at bedtime.   - Discussed/ Gave Hypoglycemia treatment plan.  # Consult : not required at this time.   # Annual urine for microalbuminuria/ creatinine ratio, + microalbuminuria currently.  He has CKD following with nephrology.   Last  Lab Results  Component Value Date   MICRALBCREAT 92 (H) 09/18/2023    # Foot check nightly / neuropathy.  # Annual dilated diabetic eye exams.   - Diet: Make healthy diabetic food choices -  Life style / activity / exercise: Discussed.  2. Blood pressure  -  BP Readings from Last 1 Encounters:  07/22/24 (!) 140/78    - Control is in target.  - No change in current plans.  3. Lipid status / Hyperlipidemia - Last  Lab Results  Component Value Date   LDLCALC 42 05/04/2024   - Continue rosuvastatin  20 mg daily.  # Panhypopituitarism -History of pituitary adenoma status post surgery in 2012. -Secondary adrenal insufficiency : Continue hydrocortisone  20 mg in the morning and 10 mg in the afternoon. -Secondary hypothyroidism : Continue current dose of levothyroxine  88 mcg daily.  Monitor free T4, not TSH, annually. -Hypogonadism: He has has not reported symptoms despite very low testosterone  level in the past and does not want to try any supplements. - Check BMP and free T4 today.  Nethan was seen today for diabetes.  Diagnoses and all orders for this visit:  Controlled type 2 diabetes mellitus with complication, with long-term current use of insulin  (HCC) -     POCT HgB A1C -     Basic metabolic panel with GFR -     Dapagliflozin  Pro-metFORMIN  ER (XIGDUO  XR) 10-998 MG TB24; Take 1 tablet by mouth daily.  Secondary adrenal insufficiency -     Basic metabolic panel with GFR -     hydrocortisone  (CORTEF ) 20 MG tablet; Take 1 tablet in the morning and half tablet at 5 PM and double dose in case of illness as instructed. Provide 175 tablets for 90 days supply.  Panhypopituitarism  Hypogonadotropic hypogonadism  Secondary hypothyroidism -     T4, free   DISPOSITION Follow up in clinic in 4  months suggested.  Labs today as ordered.   All questions answered and patient verbalized understanding of the plan.  Krisanne Lich, MD [facility] Endocrinology [facility] [address] [place], [address] [place], [place] [address] Phone # [phone]  At least part of this note was generated using voice recognition software. Inadvertent word errors may have occurred,  which were not recognized during the proofreading process.   "

## 2024-09-24 ENCOUNTER — Other Ambulatory Visit

## 2024-10-25 ENCOUNTER — Encounter

## 2024-11-02 ENCOUNTER — Ambulatory Visit: Admitting: Family Medicine

## 2024-11-24 ENCOUNTER — Ambulatory Visit: Admitting: Endocrinology
